# Patient Record
Sex: Male | Born: 1950
Health system: Southern US, Community
[De-identification: ages and names within clinical notes are randomized; demographics above are authoritative.]

## PROBLEM LIST (undated history)

## (undated) DIAGNOSIS — E785 Hyperlipidemia, unspecified: Secondary | ICD-10-CM

## (undated) DIAGNOSIS — N411 Chronic prostatitis: Secondary | ICD-10-CM

## (undated) DIAGNOSIS — I10 Essential (primary) hypertension: Secondary | ICD-10-CM

## (undated) DIAGNOSIS — L409 Psoriasis, unspecified: Secondary | ICD-10-CM

## (undated) DIAGNOSIS — K429 Umbilical hernia without obstruction or gangrene: Secondary | ICD-10-CM

## (undated) HISTORY — PX: TESTICLE SURGERY: SHX794

## (undated) HISTORY — PX: VASECTOMY: SHX75

## (undated) HISTORY — PX: HERNIA REPAIR: SHX51

## (undated) HISTORY — DX: Psoriasis, unspecified: L40.9

## (undated) HISTORY — PX: APPENDECTOMY: SHX54

## (undated) HISTORY — DX: Hyperlipidemia, unspecified: E78.5

## (undated) HISTORY — DX: Umbilical hernia without obstruction or gangrene: K42.9

## (undated) HISTORY — DX: Essential (primary) hypertension: I10

## (undated) HISTORY — DX: Chronic prostatitis: N41.1

---

## 1977-03-19 HISTORY — PX: FERTILITY SURGERY: SHX945

## 2003-03-20 LAB — HM COLONOSCOPY: HM Colonoscopy: NORMAL

## 2003-07-12 ENCOUNTER — Ambulatory Visit (HOSPITAL_COMMUNITY): Admission: RE | Admit: 2003-07-12 | Discharge: 2003-07-12 | Payer: Self-pay | Admitting: *Deleted

## 2005-11-22 ENCOUNTER — Ambulatory Visit: Payer: Self-pay | Admitting: Family Medicine

## 2005-12-15 ENCOUNTER — Emergency Department (HOSPITAL_COMMUNITY): Admission: EM | Admit: 2005-12-15 | Discharge: 2005-12-15 | Payer: Self-pay | Admitting: Emergency Medicine

## 2006-01-09 ENCOUNTER — Ambulatory Visit: Payer: Self-pay | Admitting: Family Medicine

## 2006-02-20 ENCOUNTER — Ambulatory Visit: Payer: Self-pay | Admitting: Family Medicine

## 2006-02-20 LAB — CONVERTED CEMR LAB
Chol/HDL Ratio, serum: 3.2
Cholesterol: 134 mg/dL (ref 0–200)
GFR calc non Af Amer: 66 mL/min
Glomerular Filtration Rate, Af Am: 80 mL/min/{1.73_m2}
Glucose, Bld: 104 mg/dL — ABNORMAL HIGH (ref 70–99)
HDL: 42.2 mg/dL (ref >39.0)
LDL Cholesterol: 81 mg/dL (ref 0–99)
Sodium: 141 meq/L (ref 135–145)

## 2006-03-19 HISTORY — PX: SHOULDER SURGERY: SHX246

## 2006-04-09 DIAGNOSIS — I10 Essential (primary) hypertension: Secondary | ICD-10-CM | POA: Insufficient documentation

## 2006-04-09 DIAGNOSIS — J45909 Unspecified asthma, uncomplicated: Secondary | ICD-10-CM

## 2006-04-17 ENCOUNTER — Ambulatory Visit: Payer: Self-pay | Admitting: Family Medicine

## 2006-07-17 ENCOUNTER — Ambulatory Visit: Payer: Self-pay | Admitting: Family Medicine

## 2006-10-17 ENCOUNTER — Ambulatory Visit: Payer: Self-pay | Admitting: Family Medicine

## 2006-10-17 DIAGNOSIS — J309 Allergic rhinitis, unspecified: Secondary | ICD-10-CM | POA: Insufficient documentation

## 2006-10-18 ENCOUNTER — Telehealth (INDEPENDENT_AMBULATORY_CARE_PROVIDER_SITE_OTHER): Payer: Self-pay | Admitting: *Deleted

## 2006-10-18 LAB — CONVERTED CEMR LAB
BUN: 19 mg/dL (ref 6–23)
CO2: 30 meq/L (ref 19–32)
Calcium: 9.4 mg/dL (ref 8.4–10.5)
Creatinine, Ser: 1.2 mg/dL (ref 0.4–1.5)
GFR calc Af Amer: 80 mL/min
Total CHOL/HDL Ratio: 3.9
VLDL: 13 mg/dL (ref 0–40)

## 2006-12-18 ENCOUNTER — Encounter (INDEPENDENT_AMBULATORY_CARE_PROVIDER_SITE_OTHER): Payer: Self-pay | Admitting: Family Medicine

## 2006-12-18 ENCOUNTER — Telehealth (INDEPENDENT_AMBULATORY_CARE_PROVIDER_SITE_OTHER): Payer: Self-pay | Admitting: *Deleted

## 2007-01-17 ENCOUNTER — Ambulatory Visit: Payer: Self-pay | Admitting: Family Medicine

## 2007-03-10 ENCOUNTER — Telehealth (INDEPENDENT_AMBULATORY_CARE_PROVIDER_SITE_OTHER): Payer: Self-pay | Admitting: *Deleted

## 2007-03-12 ENCOUNTER — Ambulatory Visit: Payer: Self-pay | Admitting: Family Medicine

## 2007-03-20 HISTORY — PX: NASAL SEPTUM SURGERY: SHX37

## 2007-03-20 LAB — HM COLONOSCOPY: HM Colonoscopy: NEGATIVE

## 2007-04-03 ENCOUNTER — Telehealth (INDEPENDENT_AMBULATORY_CARE_PROVIDER_SITE_OTHER): Payer: Self-pay | Admitting: *Deleted

## 2007-04-14 ENCOUNTER — Telehealth (INDEPENDENT_AMBULATORY_CARE_PROVIDER_SITE_OTHER): Payer: Self-pay | Admitting: *Deleted

## 2007-04-14 ENCOUNTER — Ambulatory Visit: Payer: Self-pay | Admitting: Family Medicine

## 2007-04-15 ENCOUNTER — Encounter (INDEPENDENT_AMBULATORY_CARE_PROVIDER_SITE_OTHER): Payer: Self-pay | Admitting: Family Medicine

## 2007-04-15 ENCOUNTER — Encounter (INDEPENDENT_AMBULATORY_CARE_PROVIDER_SITE_OTHER): Payer: Self-pay | Admitting: *Deleted

## 2007-04-15 LAB — CONVERTED CEMR LAB
CO2: 30 meq/L (ref 19–32)
Cholesterol: 144 mg/dL (ref 0–200)
Creatinine, Ser: 1.2 mg/dL (ref 0.4–1.5)
Glucose, Bld: 109 mg/dL — ABNORMAL HIGH (ref 70–99)
HDL: 40.7 mg/dL (ref >39.0)
Potassium: 3.9 meq/L (ref 3.5–5.1)
Sodium: 139 meq/L (ref 135–145)
Triglycerides: 69 mg/dL (ref 0–149)
VLDL: 14 mg/dL (ref 0–40)

## 2007-08-14 ENCOUNTER — Ambulatory Visit: Payer: Self-pay | Admitting: Internal Medicine

## 2007-10-13 ENCOUNTER — Ambulatory Visit: Payer: Self-pay | Admitting: *Deleted

## 2007-10-13 DIAGNOSIS — E785 Hyperlipidemia, unspecified: Secondary | ICD-10-CM

## 2007-11-07 ENCOUNTER — Telehealth (INDEPENDENT_AMBULATORY_CARE_PROVIDER_SITE_OTHER): Payer: Self-pay | Admitting: *Deleted

## 2007-11-07 ENCOUNTER — Ambulatory Visit: Payer: Self-pay | Admitting: Family Medicine

## 2007-11-07 DIAGNOSIS — L259 Unspecified contact dermatitis, unspecified cause: Secondary | ICD-10-CM

## 2007-12-03 ENCOUNTER — Telehealth: Payer: Self-pay | Admitting: Family Medicine

## 2007-12-24 ENCOUNTER — Ambulatory Visit: Payer: Self-pay | Admitting: Internal Medicine

## 2008-03-31 ENCOUNTER — Ambulatory Visit: Payer: Self-pay | Admitting: Internal Medicine

## 2008-03-31 DIAGNOSIS — N411 Chronic prostatitis: Secondary | ICD-10-CM | POA: Insufficient documentation

## 2008-04-02 ENCOUNTER — Telehealth (INDEPENDENT_AMBULATORY_CARE_PROVIDER_SITE_OTHER): Payer: Self-pay | Admitting: *Deleted

## 2008-04-02 LAB — CONVERTED CEMR LAB
Basophils Relative: 0.5 % (ref 0.0–3.0)
CO2: 31 meq/L (ref 19–32)
Calcium: 9.9 mg/dL (ref 8.4–10.5)
Chloride: 101 meq/L (ref 96–112)
Cholesterol: 147 mg/dL (ref 0–200)
Creatinine, Ser: 1.1 mg/dL (ref 0.4–1.5)
Eosinophils Relative: 2.2 % (ref 0.0–5.0)
Glucose, Bld: 109 mg/dL — ABNORMAL HIGH (ref 70–99)
Hemoglobin: 14.4 g/dL (ref 13.0–17.0)
Hgb A1c MFr Bld: 6 % (ref 4.6–6.0)
LDL Cholesterol: 87 mg/dL (ref 0–99)
Lymphocytes Relative: 21.3 % (ref 12.0–46.0)
Monocytes Relative: 6.9 % (ref 3.0–12.0)
Neutro Abs: 4.7 10*3/uL (ref 1.4–7.7)
Neutrophils Relative %: 69.1 % (ref 43.0–77.0)
RBC: 4.33 M/uL (ref 4.22–5.81)
TSH: 1.27 microintl units/mL (ref 0.35–5.50)
Total CHOL/HDL Ratio: 3.4
VLDL: 17 mg/dL (ref 0–40)
WBC: 6.7 10*3/uL (ref 4.5–10.5)

## 2008-05-06 ENCOUNTER — Encounter: Payer: Self-pay | Admitting: Internal Medicine

## 2008-07-12 ENCOUNTER — Encounter (INDEPENDENT_AMBULATORY_CARE_PROVIDER_SITE_OTHER): Payer: Self-pay | Admitting: *Deleted

## 2008-08-11 ENCOUNTER — Ambulatory Visit: Payer: Self-pay | Admitting: Internal Medicine

## 2008-08-11 DIAGNOSIS — R739 Hyperglycemia, unspecified: Secondary | ICD-10-CM | POA: Insufficient documentation

## 2008-08-19 LAB — CONVERTED CEMR LAB: Hgb A1c MFr Bld: 5.9 % (ref 4.6–6.5)

## 2009-01-24 LAB — CONVERTED CEMR LAB: PSA: 0.01 ng/mL

## 2009-02-16 ENCOUNTER — Ambulatory Visit: Payer: Self-pay | Admitting: Internal Medicine

## 2009-02-21 LAB — CONVERTED CEMR LAB
Creatinine,U: 146.1 mg/dL
Microalb, Ur: 0.2 mg/dL (ref 0.0–1.9)

## 2009-03-30 ENCOUNTER — Telehealth (INDEPENDENT_AMBULATORY_CARE_PROVIDER_SITE_OTHER): Payer: Self-pay | Admitting: *Deleted

## 2009-05-02 ENCOUNTER — Telehealth (INDEPENDENT_AMBULATORY_CARE_PROVIDER_SITE_OTHER): Payer: Self-pay | Admitting: *Deleted

## 2009-06-17 ENCOUNTER — Ambulatory Visit: Payer: Self-pay | Admitting: Internal Medicine

## 2009-06-20 LAB — CONVERTED CEMR LAB
ALT: 20 units/L (ref 0–53)
AST: 22 units/L (ref 0–37)
Albumin: 4 g/dL (ref 3.5–5.2)
Alkaline Phosphatase: 57 units/L (ref 39–117)
BUN: 18 mg/dL (ref 6–23)
Basophils Absolute: 0 10*3/uL (ref 0.0–0.1)
Bilirubin, Direct: 0.2 mg/dL (ref 0.0–0.3)
Chloride: 104 meq/L (ref 96–112)
Cholesterol: 131 mg/dL (ref 0–200)
Creatinine, Ser: 1.1 mg/dL (ref 0.4–1.5)
Eosinophils Absolute: 0.2 10*3/uL (ref 0.0–0.7)
GFR calc non Af Amer: 72.28 mL/min (ref >60)
Glucose, Bld: 102 mg/dL — ABNORMAL HIGH (ref 70–99)
HCT: 40 % (ref 39.0–52.0)
Lymphs Abs: 1.7 10*3/uL (ref 0.7–4.0)
MCV: 95.4 fL (ref 78.0–100.0)
Monocytes Absolute: 0.4 10*3/uL (ref 0.1–1.0)
Neutrophils Relative %: 64 % (ref 43.0–77.0)
Platelets: 127 10*3/uL — ABNORMAL LOW (ref 150.0–400.0)
Potassium: 3.8 meq/L (ref 3.5–5.1)
RDW: 13.7 % (ref 11.5–14.6)
Total Protein: 6.5 g/dL (ref 6.0–8.3)
Triglycerides: 53 mg/dL (ref 0.0–149.0)
WBC: 6.5 10*3/uL (ref 4.5–10.5)

## 2009-08-22 ENCOUNTER — Telehealth (INDEPENDENT_AMBULATORY_CARE_PROVIDER_SITE_OTHER): Payer: Self-pay | Admitting: *Deleted

## 2009-09-02 ENCOUNTER — Ambulatory Visit: Payer: Self-pay | Admitting: Internal Medicine

## 2009-10-18 ENCOUNTER — Ambulatory Visit: Payer: Self-pay | Admitting: Internal Medicine

## 2009-11-17 ENCOUNTER — Ambulatory Visit: Payer: Self-pay | Admitting: Family Medicine

## 2009-11-17 DIAGNOSIS — R21 Rash and other nonspecific skin eruption: Secondary | ICD-10-CM

## 2010-04-18 NOTE — Progress Notes (Signed)
Summary: toprol refill   Phone Note Refill Request Message from:  Fax from Pharmacy on March 30, 2009 11:05 AM  Refills Requested: Medication #1:  TOPROL XL 50 MG TB24 Take one tablet daily Initial call taken by: Doristine Devoid,  March 30, 2009 11:05 AM    Prescriptions: TOPROL XL 50 MG TB24 (METOPROLOL SUCCINATE) Take one tablet daily  #90 x 1   Entered by:   Doristine Devoid   Authorized by:   Nolon Rod. Paz MD   Signed by:   Doristine Devoid on 03/30/2009   Method used:   Electronically to        [facility]* (mail-order)             ,          Ph: [phone]       Fax: [phone]   RxID:   [id number]

## 2010-04-18 NOTE — Assessment & Plan Note (Signed)
Summary: 4 month roa//lch   Vital Signs:  Patient profile:   60 year old male Weight:      225.25 pounds Pulse rate:   57 / minute Pulse rhythm:   regular BP sitting:   128 / 84  (left arm) Cuff size:   large  Vitals Entered By: Army Fossa CMA (October 18, 2009 7:58 AM) CC: Pt here for f/u on BP: fasting   History of Present Illness: ROV doing well  patient is keeping an eye on his diet, exercise Ambulatory BPs in the 130-80 range  Current Medications (verified): 1)  Ascriptin 325 Mg Tabs (Aspirin Buf(Alhyd-Mghyd-Cacar)) .... Every Other Day 2)  Proair Hfa 108 (90 Base) Mcg/act Aers (Albuterol Sulfate) .... Use 2 Puffs Every 4-6 Hrs As Needed 3)  Fluticasone Propionate 50 Mcg/act Susp (Fluticasone Propionate) .... Use 1 To 2 Sprays in Each Nostril Daily 4)  Terazosin Hcl 1 Mg Caps (Terazosin Hcl) .... 3 By Mouth Qpm 5)  Hydrochlorothiazide 25 Mg Tabs (Hydrochlorothiazide) .... Take One Tablet Daily 6)  Albertsons Natural Vitamin E 400 Unit Caps (Vitamin E) .... Take 7)  Toprol Xl 50 Mg Tb24 (Metoprolol Succinate) .... Take One Tablet Daily 8)  Protegra   Caps (Multiple Vitamins-Minerals) 9)  Otc Antioxident 10)  Claritin 10 Mg  Tabs (Loratadine) .[name] Kitchen.. 1 A Day 11)  Natural Vitamin 12)  B1 Complex  Allergies: 1)  ! * Allegra 2)  ! * Zyrtec  Past History:  Past Medical History: Asthma Hypertension Allergic rhinitis Hyperlipidemia chronic prostatitis, sees urology Diabetes mellitus, type II (A1C 6.0 03-2008) hand dermatitis-- sees dermatolgy  Past Surgical History: Reviewed history from 03/31/2008 and no changes required. hernia repair Appendectomy fertility surgery 1979 undescended testicle repair left shoulder 2008 deviated septum 2009  Review of Systems       denies chest pain or shortness of breath asthma symptoms well-controlled, rarely used his inhalers Planning to do a colonoscopy this fall  Physical Exam  General:  alert and well-developed.     Lungs:  normal respiratory effort, no intercostal retractions, no accessory muscle use, and normal breath sounds.    Heart:  normal rate, regular rhythm, and no murmur.    Psych:  Oriented X3, memory intact for recent and remote, normally interactive, good eye contact, not anxious appearing, and not depressed appearing.      Impression & Recommendations:  Problem # 1:  DIABETES MELLITUS, TYPE II (ICD-250.00) continue with healthy lifestyle Labs  His updated medication list for this problem includes:    Ascriptin 325 Mg Tabs (Aspirin buf(alhyd-mghyd-cacar)) ..... Every other day  Labs Reviewed: Creat: 1.1 (06/17/2009)    Last Eye Exam: normal (10/17/2008) Reviewed HgBA1c results: 5.8 (02/16/2009)  5.9 (08/11/2008)     Problem # 2:  HYPERTENSION (ICD-401.9) well controlled His updated medication list for this problem includes:    Terazosin Hcl 1 Mg Caps (Terazosin hcl) .[name] KitchenMarland KitchenMarland KitchenMarland Kitchen 3 by mouth qpm    Hydrochlorothiazide 25 Mg Tabs (Hydrochlorothiazide) .[name] Kitchen... Take one tablet daily    Toprol Xl 50 Mg Tb24 (Metoprolol succinate) .[name] Kitchen... Take one tablet daily  BP today: 128/84 Prior BP: 132/84 (09/02/2009)     Problem # 3:  ASTHMA (ICD-493.90) well-controlled His updated medication list for this problem includes:    Proair Hfa 108 (90 Base) Mcg/act Aers (Albuterol sulfate) ..... Use 2 puffs every 4-6 hrs as needed    Complete Medication List: 1)  Ascriptin 325 Mg Tabs (Aspirin buf(alhyd-mghyd-cacar)) .... Every other day 2)  Proair  Hfa 108 (90 Base) Mcg/act Aers (Albuterol sulfate) .... Use 2 puffs every 4-6 hrs as needed 3)  Fluticasone Propionate 50 Mcg/act Susp (Fluticasone propionate) .... Use 1 to 2 sprays in each nostril daily 4)  Terazosin Hcl 1 Mg Caps (Terazosin hcl) .... 3 by mouth qpm 5)  Hydrochlorothiazide 25 Mg Tabs (Hydrochlorothiazide) .... Take one tablet daily 6)  Albertsons Natural Vitamin E 400 Unit Caps (Vitamin e) .... Take 7)  Toprol Xl 50 Mg Tb24 (Metoprolol  succinate) .... Take one tablet daily 8)  Protegra Caps (Multiple vitamins-minerals) 9)  Otc Antioxident  10)  Claritin 10 Mg Tabs (Loratadine) .[name] Kitchen.. 1 a day 11)  Natural Vitamin  12)  B1 Complex   Other Orders: Venipuncture (52841) TLB-A1C / Hgb A1C (Glycohemoglobin) (83036-A1C) Specimen Handling (32440)  Patient Instructions: 1)  Please schedule a follow-up appointment in 8 months  (April 2012) for your physical 2)  don't forget your flu shot this winter

## 2010-04-18 NOTE — Progress Notes (Signed)
  Phone Note Refill Request Call back at [phone] Message from:  Patient  Refills Requested: Medication #1:  TERAZOSIN HCL 1 MG CAPS 3 by mouth qpm need to send to [facility]  Initial call taken by: Kandice Hams,  May 02, 2009 3:23 PM  Follow-up for Phone Call        PT INFORMED RX SENT TO [facility] ELECTRONICALLY .Kandice Hams  May 02, 2009 3:26 PM   Follow-up by: Kandice Hams,  May 02, 2009 3:26 PM    Prescriptions: TERAZOSIN HCL 1 MG CAPS (TERAZOSIN HCL) 3 by mouth qpm  #270 x 0   Entered by:   Kandice Hams   Authorized by:   Nolon Rod. Paz MD   Signed by:   Kandice Hams on 05/02/2009   Method used:   Faxed to ...       [facility]* (mail-order)             ,          Ph: [phone]       Fax: [phone]   RxID:   [id number]

## 2010-04-18 NOTE — Progress Notes (Signed)
Summary: Refill Request  Phone Note Refill Request Call back at [phone] Message from:  Patient on August 22, 2009 9:00 AM  Refills Requested: Medication #1:  FLUTICASONE PROPIONATE 50 MCG/ACT SUSP use 1 to 2 sprays in each nostril daily   Dosage confirmed as above?Dosage Confirmed   Supply Requested: 3 months [facility]   Method Requested: Fax to [facility] Next Appointment Scheduled: 8.2.11 Initial call taken by: Harold Barban,  August 22, 2009 9:00 AM    Prescriptions: FLUTICASONE PROPIONATE 50 MCG/ACT SUSP (FLUTICASONE PROPIONATE) use 1 to 2 sprays in each nostril daily  #3 x 3   Entered by:   Jeremy Johann CMA   Authorized by:   Nolon Rod. Paz MD   Signed by:   Jeremy Johann CMA on 08/22/2009   Method used:   Faxed to ...       [facility]* (mail-order)             ,          Ph: [phone]       Fax: [phone]   RxID:   [id number]

## 2010-04-18 NOTE — Assessment & Plan Note (Signed)
Summary: sore throat/cbs   Vital Signs:  Patient profile:   60 year old male Weight:      222.8 pounds BMI:     31.19 Temp:     98.9 degrees F oral Pulse rate:   60 / minute Resp:     14 per minute BP sitting:   132 / 84  (left arm) Cuff size:   large  Vitals Entered By: Shonna Chock (September 02, 2009 11:08 AM) CC: Sore throat and cough, URI symptoms Comments REVIEWED MED LIST, PATIENT AGREED DOSE AND INSTRUCTION CORRECT    Primary Care Provider:  Dr. Drue Novel  CC:  Sore throat and cough and URI symptoms.  History of Present Illness: Onset 2 weeks ago as ST followed by head congestion. "Bronchitis" last week which improved with: Mucinex DM. Previously on Flonase for head congestion  with benefit. Severe ST this am prompted him to come in.  The patient reports minor  dry cough and  minor earache, but denies nasal congestion, purulent nasal discharge, and sick contacts.  The patient denies fever, stiff neck, dyspnea, wheezing, rash, vomiting, and diarrhea.  The patient denies itchy watery eyes, sneezing, headache, muscle aches, and severe fatigue.  Risk factors for Strep sinusitis include double sickening and tooth pain.  The patient denies the following risk factors for Strep sinusitis: unilateral facial pain, unilateral nasal discharge, and Strep exposure.  No rise in FBS with present illness  Allergies: 1)  ! * Allegra 2)  ! * Zyrtec  Physical Exam  General:  in no acute distress; alert,appropriate and cooperative throughout examination Ears:  External ear exam shows no significant lesions or deformities.  Otoscopic examination reveals clear canals, tympanic membranes are intact bilaterally without bulging, retraction, inflammation or discharge. Hearing is grossly normal bilaterally. Nose:  External nasal examination shows no deformity or inflammation. Nasal mucosa are  erythematous without lesions or exudates. Mouth:  Oral mucosa and oropharynx without lesions or exudates.  Teeth in good  repair. Mild pharyngeal erythema.   Lungs:  Normal respiratory effort, chest expands symmetrically. Lungs are clear to auscultation, no crackles or wheezes. Heart:  regular rhythm, no murmur, and bradycardia.   Cervical Nodes:  No lymphadenopathy noted Axillary Nodes:  No palpable lymphadenopathy   Impression & Recommendations:  Problem # 1:  PHARYNGITIS-ACUTE (ICD-462)  His updated medication list for this problem includes:    Ascriptin 325 Mg Tabs (Aspirin buf(alhyd-mghyd-cacar)) ..... Every other day    Trimox 500 Mg Caps (Amoxicillin) .[name] Kitchen... 1 three times a day as needed  Problem # 2:  DIABETES MELLITUS, TYPE II (ICD-250.00) Controlled with TLC His updated medication list for this problem includes:    Ascriptin 325 Mg Tabs (Aspirin buf(alhyd-mghyd-cacar)) ..... Every other day  Complete Medication List: 1)  Ascriptin 325 Mg Tabs (Aspirin buf(alhyd-mghyd-cacar)) .... Every other day 2)  Proair Hfa 108 (90 Base) Mcg/act Aers (Albuterol sulfate) .... Use 2 puffs every 4-6 hrs as needed 3)  Fluticasone Propionate 50 Mcg/act Susp (Fluticasone propionate) .... Use 1 to 2 sprays in each nostril daily 4)  Terazosin Hcl 1 Mg Caps (Terazosin hcl) .... 3 by mouth qpm 5)  Hydrochlorothiazide 25 Mg Tabs (Hydrochlorothiazide) .... Take one tablet daily 6)  Albertsons Natural Vitamin E 400 Unit Caps (Vitamin e) .... Take 7)  Toprol Xl 50 Mg Tb24 (Metoprolol succinate) .... Take one tablet daily 8)  Protegra Caps (Multiple vitamins-minerals) 9)  Otc Antioxident  10)  Claritin 10 Mg Tabs (Loratadine) .[name] Kitchen.. 1 a day 11)  Natural Vitamin  12)  B1 Complex  13)  Trimox 500 Mg Caps (Amoxicillin) .[name] Kitchen.. 1 three times a day as needed  Other Orders: Rapid Strep (81191)  Patient Instructions: 1)  Drink as much fluid as you can tolerate for the next few days. Zicam Melts or Sugar Free Hall's lozenges. Prescriptions: TRIMOX 500 MG CAPS (AMOXICILLIN) 1 three times a day as needed  #30 x 0   Entered and  Authorized by:   Marga Melnick MD   Signed by:   Marga Melnick MD on 09/02/2009   Method used:   Faxed to ...       [id number] (retail)       [address]       [place], [place]  [address]       Ph: [phone]       Fax: [phone]   RxID:   [id number]   Laboratory Results    Other Tests  Rapid Strep: negative

## 2010-04-18 NOTE — Assessment & Plan Note (Signed)
Summary: rash on leg/kn  Flu Vaccine Consent Questions     Do you have a history of severe allergic reactions to this vaccine? no    Any prior history of allergic reactions to egg and/or gelatin? no    Do you have a sensitivity to the preservative Thimersol? no    Do you have a past history of Guillan-Barre Syndrome? no    Do you currently have an acute febrile illness? no    Have you ever had a severe reaction to latex? no    Vaccine information given and explained to patient? yes    Are you currently pregnant? no    Lot Number:[id number]   Exp Date:09/16/2010   Site Given  Left Deltoid IM    Vital Signs:  Patient profile:   60 year old male Weight:      229 pounds Temp:     98.3 degrees F oral BP sitting:   120 / 86  (left arm)  Vitals Entered By: Doristine Devoid CMA (November 17, 2009 1:21 PM) CC: Rash on back of L leg and on stomach doing lots of itching   History of Present Illness: 60 yo man here today for rash on L leg and stomach.  initially on L leg and then R lower abd.  works in [employer], has had chemical rxns in the past.  sxs first appeared Tues evening.  has used hydrocortisone w/out relief.  played 18 holes of golf yesterday w/out difficulty.  no fevers, chills, pain.  Problems Prior to Update: 1)  Diabetes Mellitus, Type II  (ICD-250.00) 2)  Need Prophylactic Vaccination&inoculation Flu  (ICD-V04.81) 3)  ? of Hyperglycemia, Borderline  (ICD-790.29) 4)  Prostatitis, Chronic  (ICD-601.1) 5)  Contact Dermatitis  (ICD-692.9) 6)  Hyperlipidemia  (ICD-272.4) 7)  Preventive Health Care  (ICD-V70.0) 8)  Allergic Rhinitis  (ICD-477.9) 9)  Hypertension  (ICD-401.9) 10)  Asthma  (ICD-493.90)  Allergies (verified): 1)  ! * Allegra 2)  ! * Zyrtec  Review of Systems      See HPI  Physical Exam  General:  alert and well-developed.   Skin:  5 papular areas on L posterior/medial knee w/ surrounding erythema.  no evidence of fluctuance or streaking  redness  4 papular areas on R lower abd into groin w/ minimal surrounding redness   Impression & Recommendations:  Problem # 1:  RASH-NONVESICULAR (ICD-782.1) Assessment New unclear etiology.  no evidence of infxn.  pt reports itching 'isn't that bad'.   offered oral steroids vs topical cream.  after discussion of possible side effects pt elected to start topical cream.  reviewed supportive care and red flags that should prompt return.  Pt expresses understanding and is in agreement w/ this plan. His updated medication list for this problem includes:    Hydrocortisone 2.5 % Oint (Hydrocortisone) .[name] Kitchen... Apply to affected areas two times a day.  disp 1 large tube  Complete Medication List: 1)  Ascriptin 325 Mg Tabs (Aspirin buf(alhyd-mghyd-cacar)) .... Every other day 2)  Proair Hfa 108 (90 Base) Mcg/act Aers (Albuterol sulfate) .... Use 2 puffs every 4-6 hrs as needed 3)  Fluticasone Propionate 50 Mcg/act Susp (Fluticasone propionate) .... Use 1 to 2 sprays in each nostril daily 4)  Terazosin Hcl 1 Mg Caps (Terazosin hcl) .... 3 by mouth qpm 5)  Hydrochlorothiazide 25 Mg Tabs (Hydrochlorothiazide) .... Take one tablet daily 6)  Albertsons Natural Vitamin E 400 Unit Caps (Vitamin e) .... Take 7)  Toprol Xl  50 Mg Tb24 (Metoprolol succinate) .... Take one tablet daily 8)  Protegra Caps (Multiple vitamins-minerals) 9)  Otc Antioxident  10)  Claritin 10 Mg Tabs (Loratadine) .[name] Kitchen.. 1 a day 11)  Natural Vitamin  12)  B1 Complex  13)  Hydrocortisone 2.5 % Oint (Hydrocortisone) .... Apply to affected areas two times a day.  disp 1 large tube  Other Orders: Admin 1st Vaccine (16109) Flu Vaccine 70yrs + (60454)  Patient Instructions: 1)  Use the Hydrocortisone cream two times a day for the itching 2)  Add OTC Benadryl as needed for night time itching 3)  Make sure you wash all the clothing you were wearing while doing yard work over the weekend or while at work to avoid it from spreading 4)  If  it is worsening, spreading, has additional redness or pus- please call 5)  Hang in there and have a great holiday! Prescriptions: HYDROCORTISONE 2.5 % OINT (HYDROCORTISONE) apply to affected areas two times a day.  disp 1 large tube  #1 x 1   Entered and Authorized by:   Neena Rhymes MD   Signed by:   Neena Rhymes MD on 11/17/2009   Method used:   Electronically to        [id number] (retail)       [address]       [place], [place]  [address]       Ph: [phone]       Fax: [phone]   RxID:   [id number]

## 2010-04-18 NOTE — Assessment & Plan Note (Signed)
Summary: CPX//PH   Vital Signs:  Patient profile:   60 year old male Height:      71 inches Weight:      223.2 pounds BMI:     31.24 Pulse rate:   64 / minute BP sitting:   155 / 80  Vitals Entered By: Shary Decamp (June 17, 2009 8:16 AM) CC: cpx - fasting Comments  - pt fell 22 feet (ladder) 03/02/09 -- only injury was a sprained wrist Shary Decamp  June 17, 2009 8:25 AM    History of Present Illness: cpx - fasting doing well some stress lately  pt fell 22 feet (ladder) 03/02/09 -- only injury was a sprained wrist (better now)     Current Medications (verified): 1)  Ascriptin 325 Mg Tabs (Aspirin Buf(Alhyd-Mghyd-Cacar)) .... Every Other Day 2)  Proair Hfa 108 (90 Base) Mcg/act Aers (Albuterol Sulfate) .... Use 2 Puffs Every 4-6 Hrs As Needed 3)  Fluticasone Propionate 50 Mcg/act Susp (Fluticasone Propionate) .... Use 1 To 2 Sprays in Each Nostril Daily 4)  Terazosin Hcl 1 Mg Caps (Terazosin Hcl) .... 3 By Mouth Qpm 5)  Hydrochlorothiazide 25 Mg Tabs (Hydrochlorothiazide) .... Take One Tablet Daily 6)  Albertsons Natural Vitamin E 400 Unit Caps (Vitamin E) .... Take 7)  Toprol Xl 50 Mg Tb24 (Metoprolol Succinate) .... Take One Tablet Daily 8)  Protegra   Caps (Multiple Vitamins-Minerals) 9)  Otc Antioxident 10)  Claritin 10 Mg  Tabs (Loratadine) .[name] Kitchen.. 1 A Day 11)  Natural Vitamin 12)  B1 Complex  Allergies (verified): 1)  ! * Allegra 2)  ! * Zyrtec  Past History:  Past Medical History: Asthma Hypertension Allergic rhinitis Hyperlipidemia chronic prostatitis, sees urology Diabetes mellitus, type II (A1C 6.0 03-2008) hand dermatitis-- sees dermatolgy  Past Surgical History: Reviewed history from 03/31/2008 and no changes required. hernia repair Appendectomy fertility surgery 1979 undescended testicle repair left shoulder 2008 deviated septum 2009  Family History: Reviewed history from 03/31/2008 and no changes required. Diabetes-- sister  High  cholesterol-- (+)? Hypertension-- M, sister  MI: father, x 3 , onset age 8 , died at 77  polyps colon--F and M colon ca--no prostate ca--no bladder ca--F   Social History: Occupation: Diplomatic Services operational officer Married 2 children Never Smoked Alcohol use-yes:socially Drug use-no exercises routinely @ the [employer]-- watching   Review of Systems       Hypertension-- good ambulatory BPs , pulse 55x' chronic prostatitis-- sees urology routinely  Diabetes -- watching his diet , has lost wt  ENT:  good medication compliance w/  flonase . CV:  Denies chest pain or discomfort and swelling of feet. Resp:  uses albuterol rarely (q 3 months). GI:  Denies bloody stools, diarrhea, nausea, and vomiting.  Physical Exam  General:  alert and well-developed.  has lost a few pounds Neck:  no masses, no thyromegaly, and normal carotid upstroke.   Lungs:  normal respiratory effort, no intercostal retractions, no accessory muscle use, and normal breath sounds.   Heart:  normal rate, regular rhythm, no murmur, and no gallop.   Abdomen:  soft, non-tender, no distention, no masses, no guarding, and no rigidity.   Extremities:  no lower extremity edema Psych:  Oriented X3, memory intact for recent and remote, normally interactive, good eye contact, not anxious appearing, and not depressed appearing.     Impression & Recommendations:  Problem # 1:  PREVENTIVE HEALTH CARE (ICD-V70.0)  continue with his healthier  lifestyle  Td 2003 had PSAs checked  @ [facility]   Cscope [phone], repeat in 5 to 10 years  (Dr Luther Parody), FH of colon polyps---------> rec Cscope     Orders: Venipuncture (60454) TLB-BMP (Basic Metabolic Panel-BMET) (80048-METABOL) TLB-CBC Platelet - w/Differential (85025-CBCD) TLB-Lipid Panel (80061-LIPID) TLB-Hepatic/Liver Function Pnl (80076-HEPATIC) Gastroenterology Referral (GI)  Problem # 2:  DIABETES MELLITUS, TYPE II (ICD-250.00) has improved his  lifestyle,  doing well His updated medication list for this problem includes:    Ascriptin 325 Mg Tabs (Aspirin buf(alhyd-mghyd-cacar)) ..... Every other day  Problem # 3:  HYPERTENSION (ICD-401.9) reports good amb.  BPs see  instructions His updated medication list for this problem includes:    Terazosin Hcl 1 Mg Caps (Terazosin hcl) .[name] KitchenMarland KitchenMarland KitchenMarland Kitchen 3 by mouth qpm    Hydrochlorothiazide 25 Mg Tabs (Hydrochlorothiazide) .[name] Kitchen... Take one tablet daily    Toprol Xl 50 Mg Tb24 (Metoprolol succinate) .[name] Kitchen... Take one tablet daily  BP today: 155/80 Prior BP: 152/94 (02/16/2009)  Labs Reviewed: K+: 3.9 (03/31/2008) Creat: : 1.1 (03/31/2008)   Chol: 147 (03/31/2008)   HDL: 43.5 (03/31/2008)   LDL: 87 (03/31/2008)   TG: 83 (03/31/2008)  Complete Medication List: 1)  Ascriptin 325 Mg Tabs (Aspirin buf(alhyd-mghyd-cacar)) .... Every other day 2)  Proair Hfa 108 (90 Base) Mcg/act Aers (Albuterol sulfate) .... Use 2 puffs every 4-6 hrs as needed 3)  Fluticasone Propionate 50 Mcg/act Susp (Fluticasone propionate) .... Use 1 to 2 sprays in each nostril daily 4)  Terazosin Hcl 1 Mg Caps (Terazosin hcl) .... 3 by mouth qpm 5)  Hydrochlorothiazide 25 Mg Tabs (Hydrochlorothiazide) .... Take one tablet daily 6)  Albertsons Natural Vitamin E 400 Unit Caps (Vitamin e) .... Take 7)  Toprol Xl 50 Mg Tb24 (Metoprolol succinate) .... Take one tablet daily 8)  Protegra Caps (Multiple vitamins-minerals) 9)  Otc Antioxident  10)  Claritin 10 Mg Tabs (Loratadine) .[name] Kitchen.. 1 a day 11)  Natural Vitamin  12)  B1 Complex   Patient Instructions: 1)  Please schedule a follow-up appointment in 4 months . 2)  Check your blood pressure 2 or 3 times a week. If it is more than 140/85 consistently,please let us know   Prescriptions: HYDROCHLOROTHIAZIDE 25 MG TABS (HYDROCHLOROTHIAZIDE) Take one tablet daily  #90 x 3   Entered and Authorized by:   Nolon Rod. Toneisha Savary MD   Signed by:   Nolon Rod. Mariaclara Spear MD on 06/17/2009   Method used:   Electronically  to        [facility]* (mail-order)             ,          Ph: [phone]       Fax: [phone]   RxID:   [id number] TERAZOSIN HCL 1 MG CAPS (TERAZOSIN HCL) 3 by mouth qpm  #270 x 3   Entered and Authorized by:   Nolon Rod. Shandrika Ambers MD   Signed by:   Nolon Rod. Colbe Viviano MD on 06/17/2009   Method used:   Electronically to        [facility] (mail-order)             ,          Ph: [phone]       Fax: [phone]   RxID:   [id number] TOPROL XL 50 MG TB24 (METOPROLOL SUCCINATE) Take one tablet daily  #90 x 4   Entered and Authorized by:   Nolon Rod. Gracyn Allor MD   Signed by:  Bellamie Turney E. Anthonymichael Munday MD on 06/17/2009   Method used:   Electronically to        [facility]* (mail-order)             ,          Ph: [phone]       Fax: [phone]   RxID:   [id number]    Preventive Care Screening  Prior Values:    PSA:  0.01 (01/24/2009)    Colonoscopy:  normal (03/20/2003)    Last Tetanus Booster:  given (03/19/2001)    Last Flu Shot:  Fluvax 3+ (12/24/2007)    Risk Factors: Tobacco use:  never Passive smoke exposure:  yes Drug use:  no HIV high-risk behavior:  no Caffeine use:  2 drinks per day Alcohol use:  yes    Type:  beer    Drinks per day:  <1 Exercise:  yes    Times per week:  4    Type:  walk Seatbelt use:  100 % Sun Exposure:  occasionally  Family History Risk Factors:    Family History of MI in females < 64 years old:  no    Family History of MI in males < 33 years old:  no  Colonoscopy History:    Date of Last Colonoscopy:  03/20/2003

## 2010-07-19 ENCOUNTER — Encounter: Payer: Self-pay | Admitting: Internal Medicine

## 2010-07-20 ENCOUNTER — Encounter: Payer: Self-pay | Admitting: Internal Medicine

## 2010-07-20 ENCOUNTER — Ambulatory Visit (INDEPENDENT_AMBULATORY_CARE_PROVIDER_SITE_OTHER): Payer: BC Managed Care – PPO | Admitting: Internal Medicine

## 2010-07-20 DIAGNOSIS — Z0000 Encounter for general adult medical examination without abnormal findings: Secondary | ICD-10-CM | POA: Insufficient documentation

## 2010-07-20 DIAGNOSIS — E785 Hyperlipidemia, unspecified: Secondary | ICD-10-CM

## 2010-07-20 DIAGNOSIS — I10 Essential (primary) hypertension: Secondary | ICD-10-CM

## 2010-07-20 DIAGNOSIS — J45909 Unspecified asthma, uncomplicated: Secondary | ICD-10-CM

## 2010-07-20 DIAGNOSIS — E119 Type 2 diabetes mellitus without complications: Secondary | ICD-10-CM

## 2010-07-20 LAB — CBC WITH DIFFERENTIAL/PLATELET
Basophils Relative: 0.5 % (ref 0.0–3.0)
Eosinophils Absolute: 0.2 10*3/uL (ref 0.0–0.7)
HCT: 38.8 % — ABNORMAL LOW (ref 39.0–52.0)
Hemoglobin: 13.3 g/dL (ref 13.0–17.0)
Lymphocytes Relative: 30.1 % (ref 12.0–46.0)
Lymphs Abs: 1.6 10*3/uL (ref 0.7–4.0)
MCHC: 34.2 g/dL (ref 30.0–36.0)
Neutro Abs: 3.3 10*3/uL (ref 1.4–7.7)
RBC: 4.05 Mil/uL — ABNORMAL LOW (ref 4.22–5.81)

## 2010-07-20 LAB — BASIC METABOLIC PANEL
BUN: 15 mg/dL (ref 6–23)
CO2: 29 mEq/L (ref 19–32)
Chloride: 99 mEq/L (ref 96–112)
Creatinine, Ser: 1.1 mg/dL (ref 0.4–1.5)

## 2010-07-20 LAB — LIPID PANEL
Cholesterol: 152 mg/dL (ref 0–200)
Total CHOL/HDL Ratio: 4
Triglycerides: 85 mg/dL (ref 0.0–149.0)

## 2010-07-20 LAB — ALT: ALT: 18 U/L (ref 0–53)

## 2010-07-20 LAB — MICROALBUMIN / CREATININE URINE RATIO: Microalb, Ur: 0.1 mg/dL (ref 0.0–1.9)

## 2010-07-20 LAB — AST: AST: 24 U/L (ref 0–37)

## 2010-07-20 LAB — TSH: TSH: 1.13 u[IU]/mL (ref 0.35–5.50)

## 2010-07-20 NOTE — Assessment & Plan Note (Signed)
Well controlled w/ albuterol PRN

## 2010-07-20 NOTE — Progress Notes (Signed)
  Subjective:    Patient ID: Kirk Sutton, male    DOB: 1950/10/27, 60 y.o.   MRN: [id number]  HPI  CPX  Past Medical History  Diagnosis Date  . Asthma   . Hypertension   . Allergic rhinitis   . Hyperlipidemia   . Chronic prostatitis     sees urology  . Diabetes mellitus     type 11 (a1c 6.0 03/2008)  . Psoriasis     @ hands, sees derm   Past Surgical History  Procedure Date  . Hernia repair   . Appendectomy   . Fertility surgery 1979  . Testicle surgery     undescended repair   . Shoulder surgery 2008    left  . Nasal septum surgery 2009   Family History  Problem Relation Age of Onset  . Diabetes Sister   . Hyperlipidemia    . Hypertension Mother   . Hypertension Sister   . Heart attack Father     x 3 onset age 25, died at 27  . Colon polyps Mother   . Colon polyps Father   . Colon cancer Neg Hx   . Prostate cancer Neg Hx    History   Social History  . Marital Status: Married    Spouse Name: N/A    Number of Children: 2  . Years of Education: N/A   Occupational History  . Water treatment Dispensing optician    Social History Main Topics  . Smoking status: Never Smoker   . Smokeless tobacco: Not on file  . Alcohol Use: Yes     socially  . Drug Use: No  . Sexually Active: Not on file   Other Topics Concern  . Not on file   Social History Narrative   Ex[facility]- [facility], walking 2-3 times weekly.[name] KitchenMarland KitchenDiet-- watching       Review of Systems  Constitutional: Negative for fever, fatigue and unexpected weight change.       No amb CBGs, watching diet  Respiratory: Negative for cough and shortness of breath.   Cardiovascular: Negative for chest pain and leg swelling.       Amb BP wnl  Gastrointestinal: Negative for abdominal pain and blood in stool.  Psychiatric/Behavioral:       No anxiety-depression       Objective:   Physical Exam  Constitutional: He is oriented to person, place, and time. He appears well-developed and  well-nourished. No distress.  HENT:  Head: Normocephalic and atraumatic.  Neck: No thyromegaly present.       Normal carotid pulse   Cardiovascular: Normal rate, regular rhythm and normal heart sounds.   No murmur heard. Pulmonary/Chest: Effort normal and breath sounds normal. No respiratory distress. He has no wheezes. He has no rales.  Abdominal: Soft. Bowel sounds are normal. He exhibits no distension. There is no tenderness. There is no rebound and no guarding.  Musculoskeletal: He exhibits no edema.       NORMAL DIABETIC FOOT EXAM, NORMAL PEDAL PULSES   Neurological: He is alert and oriented to person, place, and time.  Skin: He is not diaphoretic.       Anterior neck: 4mm, non fluctuant inflamation (folliculitis)  Psychiatric: He has a normal mood and affect. His behavior is normal. Judgment and thought content normal.          Assessment & Plan:

## 2010-07-20 NOTE — Assessment & Plan Note (Signed)
Diet controlled, labs 

## 2010-07-20 NOTE — Assessment & Plan Note (Signed)
Due for labs

## 2010-07-20 NOTE — Assessment & Plan Note (Addendum)
Td 2003 had PSAs checked  @ [facility]   Cscope [phone], repeat in 5 to 10 years  (Dr Luther Parody), FH of colon polyps, pt knows is due for a Cscope and plans to do that soon Diet-exercise discussed  Also has folliculitis in the neck, getting better, call if not well in few days or if worse

## 2010-07-24 ENCOUNTER — Telehealth: Payer: Self-pay | Admitting: *Deleted

## 2010-07-24 NOTE — Telephone Encounter (Signed)
Message copied by Army Fossa on Mon Jul 24, 2010 10:25 AM ------      Message from: Willow Ora      Created: Sat Jul 22, 2010  1:40 PM       Advise patient:      diabetes and cholesterol well controlled       Hemoglobin is normal but is slightly lower than before, and that needs to be rechecked from time to time.      Very good results, continue with same medicines

## 2010-07-24 NOTE — Telephone Encounter (Signed)
Message left for patient to return my call.  

## 2010-08-14 ENCOUNTER — Other Ambulatory Visit: Payer: Self-pay | Admitting: Internal Medicine

## 2010-08-16 ENCOUNTER — Other Ambulatory Visit: Payer: Self-pay | Admitting: Internal Medicine

## 2010-08-16 MED ORDER — METOPROLOL SUCCINATE ER 50 MG PO TB24: 50.0000 mg | ORAL_TABLET | Freq: Every day | ORAL | Status: DC

## 2010-08-16 MED ORDER — TERAZOSIN HCL 1 MG PO CAPS: ORAL_CAPSULE | ORAL | Status: DC

## 2010-08-16 MED ORDER — ALBUTEROL SULFATE HFA 108 (90 BASE) MCG/ACT IN AERS: 2.0000 | INHALATION_SPRAY | Freq: Four times a day (QID) | RESPIRATORY_TRACT | Status: DC | PRN

## 2010-08-16 MED ORDER — HYDROCHLOROTHIAZIDE 25 MG PO TABS: 25.0000 mg | ORAL_TABLET | Freq: Every day | ORAL | Status: DC

## 2010-08-16 NOTE — Telephone Encounter (Signed)
meds refilled 

## 2010-10-24 ENCOUNTER — Other Ambulatory Visit: Payer: Self-pay | Admitting: Internal Medicine

## 2010-11-16 ENCOUNTER — Other Ambulatory Visit: Payer: Self-pay | Admitting: Internal Medicine

## 2010-11-16 MED ORDER — ALBUTEROL SULFATE HFA 108 (90 BASE) MCG/ACT IN AERS: 2.0000 | INHALATION_SPRAY | Freq: Four times a day (QID) | RESPIRATORY_TRACT | Status: DC | PRN

## 2010-11-16 NOTE — Telephone Encounter (Signed)
patient wants rx called in to new pharm - [facility] [place] for  pro air inhaler

## 2010-11-16 NOTE — Telephone Encounter (Signed)
Rx Done . 

## 2011-01-22 ENCOUNTER — Encounter: Payer: Self-pay | Admitting: Internal Medicine

## 2011-01-22 ENCOUNTER — Ambulatory Visit (INDEPENDENT_AMBULATORY_CARE_PROVIDER_SITE_OTHER): Payer: BC Managed Care – PPO | Admitting: Internal Medicine

## 2011-01-22 DIAGNOSIS — I10 Essential (primary) hypertension: Secondary | ICD-10-CM

## 2011-01-22 DIAGNOSIS — J45909 Unspecified asthma, uncomplicated: Secondary | ICD-10-CM

## 2011-01-22 DIAGNOSIS — E119 Type 2 diabetes mellitus without complications: Secondary | ICD-10-CM

## 2011-01-22 DIAGNOSIS — Z23 Encounter for immunization: Secondary | ICD-10-CM

## 2011-01-22 LAB — BASIC METABOLIC PANEL
CO2: 30 mEq/L (ref 19–32)
Glucose, Bld: 103 mg/dL — ABNORMAL HIGH (ref 70–99)
Potassium: 3.7 mEq/L (ref 3.5–5.1)
Sodium: 141 mEq/L (ref 135–145)

## 2011-01-22 NOTE — Progress Notes (Signed)
  Subjective:    Patient ID: Kirk Sutton, male    DOB: 11-Sep-1950, 60 y.o.   MRN: [id number]  HPI Routine office visit, feeling well.  Past Medical History  Diagnosis Date  . Asthma   . Hypertension   . Allergic rhinitis   . Hyperlipidemia   . Chronic prostatitis     sees urology  . Diabetes mellitus     type 11 (a1c 6.0 03/2008)  . Psoriasis     @ hands, sees derm   Past Surgical History  Procedure Date  . Hernia repair   . Appendectomy   . Fertility surgery 1979  . Testicle surgery     undescended repair   . Shoulder surgery 2008    left  . Nasal septum surgery 2009     Review of Systems Good med compliance w/ all meds (asa 325  qod) Hardly ever uses albuterol He remains active amb BPs in the 120/80 or better  No CP-SOB     Objective:   Physical Exam  Constitutional: He is oriented to person, place, and time. He appears well-developed and well-nourished. No distress.  Cardiovascular: Normal rate, regular rhythm and normal heart sounds.   No murmur heard. Pulmonary/Chest: Effort normal and breath sounds normal. No respiratory distress. He has no wheezes. He has no rales.  Musculoskeletal: He exhibits no edema.  Neurological: He is alert and oriented to person, place, and time.  Skin: He is not diaphoretic.       Assessment & Plan:

## 2011-01-22 NOTE — Assessment & Plan Note (Signed)
To have a cscope soon PSA to be checked soon at urology

## 2011-01-22 NOTE — Assessment & Plan Note (Signed)
Labs

## 2011-01-22 NOTE — Assessment & Plan Note (Signed)
At goal, check a BMP

## 2011-01-22 NOTE — Assessment & Plan Note (Signed)
weel controlled Had a flu shot already

## 2011-01-23 ENCOUNTER — Encounter: Payer: Self-pay | Admitting: Internal Medicine

## 2011-02-28 ENCOUNTER — Ambulatory Visit (INDEPENDENT_AMBULATORY_CARE_PROVIDER_SITE_OTHER): Payer: BC Managed Care – PPO | Admitting: Internal Medicine

## 2011-02-28 VITALS — BP 138/78 | HR 67 | Temp 98.7°F | Ht 70.5 in | Wt 231.0 lb

## 2011-02-28 DIAGNOSIS — J40 Bronchitis, not specified as acute or chronic: Secondary | ICD-10-CM

## 2011-02-28 MED ORDER — ALBUTEROL SULFATE HFA 108 (90 BASE) MCG/ACT IN AERS: 2.0000 | INHALATION_SPRAY | Freq: Four times a day (QID) | RESPIRATORY_TRACT | Status: DC | PRN

## 2011-02-28 MED ORDER — AZITHROMYCIN 250 MG PO TABS: ORAL_TABLET | ORAL | Status: AC

## 2011-02-28 NOTE — Patient Instructions (Signed)
Rest, fluids , tylenol For cough, take Mucinex DM twice a day as needed   Take the antibiotic as prescribed  (zithromax ) Call if no better in few days Call anytime if the symptoms are severe  

## 2011-02-28 NOTE — Progress Notes (Signed)
  Subjective:    Patient ID: Kirk Sutton, male    DOB: March 18, 1951, 60 y.o.   MRN: [id number]  HPI 2 weeks h/o cough, mostly dry, occ sputum, slt green. Asthma ok, has not increaswed the need of inhalers but needs a RF  Past Medical History: Asthma Hypertension Allergic rhinitis Hyperlipidemia chronic prostatitis, sees urology Diabetes mellitus, type II (A1C 6.0 03-2008) Psoriasis (mostly hands)-- sees dermatolgy  Past Surgical History: hernia repair Appendectomy fertility surgery 1979 undescended testicle repair left shoulder 2008 deviated septum 2009  Family History: Diabetes-- sister  High cholesterol-- (+)? Hypertension-- M, sister  MI: father, x 3 , onset age 53 , died at 29  polyps colon--F and M colon ca--no prostate ca--no bladder ca--F   Social History: Occupation: Diplomatic Services operational officer Married 2 children Never Smoked Alcohol use-yes:socially Drug use-no    Review of Systems No F-C No N-V-D Mild SOB w/cough episodes No CP    Objective:   Physical Exam  Constitutional: He appears well-developed and well-nourished.  HENT:  Head: Normocephalic and atraumatic.  Right Ear: External ear normal.  Left Ear: External ear normal.  Nose: Nose normal.  Mouth/Throat: No oropharyngeal exudate.       Face symmetric, no TTP.  Cardiovascular: Normal rate and regular rhythm.   No murmur heard. Pulmonary/Chest:       Few ronchi w/cough, otherwise neg           Assessment & Plan:  Bronchitis: See instructions

## 2011-05-07 ENCOUNTER — Other Ambulatory Visit: Payer: Self-pay | Admitting: Internal Medicine

## 2011-05-07 NOTE — Telephone Encounter (Signed)
Refill done.  

## 2011-07-23 ENCOUNTER — Encounter: Payer: BC Managed Care – PPO | Admitting: Internal Medicine

## 2011-08-22 ENCOUNTER — Ambulatory Visit (INDEPENDENT_AMBULATORY_CARE_PROVIDER_SITE_OTHER): Payer: BC Managed Care – PPO | Admitting: Internal Medicine

## 2011-08-22 ENCOUNTER — Encounter: Payer: Self-pay | Admitting: Internal Medicine

## 2011-08-22 VITALS — BP 148/86 | HR 50 | Temp 97.7°F | Ht 70.25 in | Wt 229.0 lb

## 2011-08-22 DIAGNOSIS — Z2911 Encounter for prophylactic immunotherapy for respiratory syncytial virus (RSV): Secondary | ICD-10-CM

## 2011-08-22 DIAGNOSIS — I10 Essential (primary) hypertension: Secondary | ICD-10-CM

## 2011-08-22 DIAGNOSIS — E119 Type 2 diabetes mellitus without complications: Secondary | ICD-10-CM

## 2011-08-22 DIAGNOSIS — Z23 Encounter for immunization: Secondary | ICD-10-CM

## 2011-08-22 DIAGNOSIS — Z0000 Encounter for general adult medical examination without abnormal findings: Secondary | ICD-10-CM

## 2011-08-22 DIAGNOSIS — J45909 Unspecified asthma, uncomplicated: Secondary | ICD-10-CM

## 2011-08-22 LAB — CBC WITH DIFFERENTIAL/PLATELET
Eosinophils Relative: 2.7 % (ref 0.0–5.0)
HCT: 40.8 % (ref 39.0–52.0)
Hemoglobin: 13.6 g/dL (ref 13.0–17.0)
Lymphs Abs: 1.8 10*3/uL (ref 0.7–4.0)
Monocytes Relative: 6.7 % (ref 3.0–12.0)
Neutro Abs: 4.6 10*3/uL (ref 1.4–7.7)
WBC: 7.1 10*3/uL (ref 4.5–10.5)

## 2011-08-22 LAB — BASIC METABOLIC PANEL
CO2: 29 mEq/L (ref 19–32)
Calcium: 8.9 mg/dL (ref 8.4–10.5)
Creatinine, Ser: 1 mg/dL (ref 0.4–1.5)
Glucose, Bld: 96 mg/dL (ref 70–99)

## 2011-08-22 LAB — LIPID PANEL: Total CHOL/HDL Ratio: 3

## 2011-08-22 LAB — HEMOGLOBIN A1C: Hgb A1c MFr Bld: 5.9 % (ref 4.6–6.5)

## 2011-08-22 NOTE — Progress Notes (Signed)
  Subjective:    Patient ID: Kirk Sutton, male    DOB: 10-Oct-1950, 61 y.o.   MRN: [id number]  HPI Complete physical exam In general doing very well, he has been very concerned lately because his wife's health (cirrhosis? Undergoing a workup). Also his daughter is going through a divorce.  Past Medical History  Diagnosis Date  . Asthma   . Hypertension   . Allergic rhinitis   . Hyperlipidemia   . Chronic prostatitis     sees urology  . Diabetes mellitus     type 11 (a1c 6.0 03/2008)  . Psoriasis     @ hands, sees derm   Past Surgical History  Procedure Date  . Hernia repair   . Appendectomy   . Fertility surgery 1979  . Testicle surgery     undescended repair   . Shoulder surgery 2008    left  . Nasal septum surgery 2009   History   Social History  . Marital Status: Married    Spouse Name: N/A    Number of Children: 2  . Years of Education: N/A   Occupational History  . Water treatment Dispensing optician    Social History Main Topics  . Smoking status: Never Smoker   . Smokeless tobacco: Never Used  . Alcohol Use: Yes     socially  . Drug Use: No  . Sexually Active: Not on file   Other Topics Concern  . Not on file   Social History Narrative   Exercise- golf, walking 2-3 times weekly, Diet-- watching    Family History  Problem Relation Age of Onset  . Diabetes Sister   . Hypertension Mother   . Hypertension Sister   . Heart attack Father     x 3 onset age 11, died at 77  . Colon polyps Mother   . Colon polyps Father   . Colon cancer Neg Hx   . Prostate cancer Neg Hx   . Cancer Father     bladder    Review of Systems No chest pain or shortness of breath No nausea, vomiting, diarrhea or blood in the stools. He sees dermatology routinely. He sees urology routinely. Hardly ever uses a inhaler     Objective:   Physical Exam  General -- alert, well-developed, and slt overweight appearing. No apparent distress.  Neck --no  thyromegaly Lungs -- normal respiratory effort, no intercostal retractions, no accessory muscle use, and normal breath sounds.   Heart-- normal rate, regular rhythm, no murmur, and no gallop.   Abdomen--soft, non-tender, no distention, no masses, no HSM, no guarding, and no rigidity.   DIABETIC FEET EXAM: No lower extremity edema Normal pedal pulses bilaterally Skin and nails are normal without calluses Pinprick examination of the feet normal. Neurologic-- alert & oriented X3 and strength normal in all extremities. Psych-- Cognition and judgment appear intact. Alert and cooperative with normal attention span and concentration.  not anxious appearing and not depressed appearing.      Assessment & Plan:  In addition to his yearly checkup, we spent more than managing his chronic medical issues as well as counseling about stress

## 2011-08-22 NOTE — Assessment & Plan Note (Addendum)
A1c is has been consistently good. Check A1c today. If it is normal, we'll followup in one year. Feet exam normal today. He sees Dr. Hazle Quant routinely for eye care.

## 2011-08-22 NOTE — Assessment & Plan Note (Signed)
Good medication compliance, BP today 140/86, BP at his dentist not long ago was 120/70. Thinks BP slightly elevated today due to to stress. See instructions. Follow up in one year.

## 2011-08-22 NOTE — Assessment & Plan Note (Signed)
- 

## 2011-08-22 NOTE — Patient Instructions (Signed)
Check the  blood pressure 2 or 3 times a month, be sure it is between 110/60 and 140/85. If it is consistently higher or lower, let me know  

## 2011-08-22 NOTE — Assessment & Plan Note (Addendum)
Td 2003 and today Benefits of a Zostavax discuss he likes to get that done. Shot provided today. Discussed diet and exercise Has a colonoscopy schedule for July 2013 with Dr. Leda Quail in [facility]. Having some stress, see history of present illness. Denies any to the new medication at this point but encouraged to call me if needed. Sees urology yearly around October.

## 2011-08-23 ENCOUNTER — Encounter: Payer: Self-pay | Admitting: *Deleted

## 2011-09-10 ENCOUNTER — Other Ambulatory Visit: Payer: Self-pay | Admitting: Internal Medicine

## 2011-09-10 MED ORDER — METOPROLOL SUCCINATE ER 50 MG PO TB24: 50.0000 mg | ORAL_TABLET | Freq: Every day | ORAL | Status: DC

## 2011-09-10 NOTE — Telephone Encounter (Signed)
Refill done.  

## 2011-09-10 NOTE — Telephone Encounter (Signed)
refill metoprolol suc er tab 50MG  Tablet , requesting 90-day supply, take one by mouth daily, last wrt 5.30.12, last ov 6.5.13

## 2011-09-17 MED ORDER — METOPROLOL SUCCINATE ER 50 MG PO TB24: 50.0000 mg | ORAL_TABLET | Freq: Every day | ORAL | Status: DC

## 2011-09-17 NOTE — Addendum Note (Signed)
Addended by: Candie Echevaria L on: 09/17/2011 10:26 AM   Modules accepted: Orders

## 2011-09-17 NOTE — Telephone Encounter (Signed)
Pt left VM that his pharmacy has contacted [place] and we have not responded. Advise Pt Rx sent to incorrect pharmacy but Rx has been resent to correct pharmacy and Rx has been sent in to local pharmacy in case he run out of med before mail order arrive. Pt ok info

## 2011-10-25 ENCOUNTER — Other Ambulatory Visit: Payer: Self-pay | Admitting: *Deleted

## 2011-10-25 MED ORDER — HYDROCHLOROTHIAZIDE 25 MG PO TABS: 25.0000 mg | ORAL_TABLET | Freq: Every day | ORAL | Status: DC

## 2011-10-25 MED ORDER — TERAZOSIN HCL 1 MG PO CAPS: ORAL_CAPSULE | ORAL | Status: DC

## 2011-10-25 NOTE — Telephone Encounter (Signed)
Pt aware, Rx sent. 

## 2011-12-18 ENCOUNTER — Telehealth: Payer: Self-pay

## 2011-12-18 MED ORDER — FLUTICASONE PROPIONATE 50 MCG/ACT NA SUSP: 2.0000 | Freq: Every day | NASAL | Status: DC

## 2011-12-18 NOTE — Telephone Encounter (Signed)
Rx sent Pt aware.    MW  

## 2012-01-10 ENCOUNTER — Ambulatory Visit (INDEPENDENT_AMBULATORY_CARE_PROVIDER_SITE_OTHER): Payer: BC Managed Care – PPO

## 2012-01-10 DIAGNOSIS — Z23 Encounter for immunization: Secondary | ICD-10-CM

## 2012-02-21 ENCOUNTER — Ambulatory Visit (INDEPENDENT_AMBULATORY_CARE_PROVIDER_SITE_OTHER): Payer: BC Managed Care – PPO | Admitting: Internal Medicine

## 2012-02-21 VITALS — BP 146/88 | HR 55 | Temp 97.7°F | Wt 234.0 lb

## 2012-02-21 DIAGNOSIS — J45909 Unspecified asthma, uncomplicated: Secondary | ICD-10-CM

## 2012-02-21 DIAGNOSIS — J329 Chronic sinusitis, unspecified: Secondary | ICD-10-CM

## 2012-02-21 DIAGNOSIS — I10 Essential (primary) hypertension: Secondary | ICD-10-CM

## 2012-02-21 MED ORDER — FLUTICASONE PROPIONATE 50 MCG/ACT NA SUSP: 2.0000 | Freq: Every day | NASAL | Status: DC

## 2012-02-21 MED ORDER — AMOXICILLIN 500 MG PO CAPS: 1000.0000 mg | ORAL_CAPSULE | Freq: Two times a day (BID) | ORAL | Status: DC

## 2012-02-21 NOTE — Progress Notes (Signed)
  Subjective:    Patient ID: Kirk Sutton, male    DOB: 1951/02/18, 61 y.o.   MRN: [id number]  HPI Acute visit Symptoms started about a week ago, sore throat, sinus congestion, green bloody discharge from the nose every morning. Before the onset of symptoms he was   using albuterol rarely and now has used it 5 or 6 times this week.  Past Medical History  Diagnosis Date  . Asthma   . Hypertension   . Allergic rhinitis   . Hyperlipidemia   . Chronic prostatitis     sees urology  . Diabetes mellitus     type 11 (a1c 6.0 03/2008)  . Psoriasis     @ hands, sees derm   Past Surgical History  Procedure Date  . Hernia repair   . Appendectomy   . Fertility surgery 1979  . Testicle surgery     undescended repair   . Shoulder surgery 2008    left  . Nasal septum surgery 2009   Patient is a nonsmoker    Review of Systems No fever or chills No nausea, vomiting, diarrhea. Some cough mostly at night with mild sputum production. BP today slightly elevated, he checks frequently at home and is about 120/80 consistently    Objective:   Physical Exam  General -- alert, well-developed, and well-nourished.   HEENT -- TMs normal, throat w/o redness, face symmetric and slt tender to palpation at maxilary area, not at the frontal sinuses Lungs -- normal respiratory effort, no intercostal retractions, no accessory muscle use, and few ronchi. Heart-- normal rate, regular rhythm, no murmur, and no gallop.   Neurologic-- alert & oriented X3 and strength normal in all extremities. Psych-- Cognition and judgment appear intact. Alert and cooperative with normal attention span and concentration.  not anxious appearing and not depressed appearing.      Assessment & Plan:  Mild sinusitis, see instructions

## 2012-02-21 NOTE — Assessment & Plan Note (Addendum)
Chronic symptoms well-controlled, hardly ever used albuterol. Mild exacerbation the last few day, recommend to call if not back to baseline soon.

## 2012-02-21 NOTE — Patient Instructions (Addendum)
Rest, fluids , tylenol For cough, take Mucinex DM twice a day as needed  Use the nasal spray consistently Take the antibiotic as prescribed  (Amoxicillin) Call if no better in few days Call anytime if the symptoms are severe

## 2012-02-21 NOTE — Assessment & Plan Note (Signed)
BP slightly elevated today but reports excellent ambulatory BPs

## 2012-02-22 ENCOUNTER — Encounter: Payer: Self-pay | Admitting: Internal Medicine

## 2012-03-16 ENCOUNTER — Other Ambulatory Visit: Payer: Self-pay | Admitting: Internal Medicine

## 2012-03-17 NOTE — Telephone Encounter (Signed)
Refill done.  

## 2012-04-22 ENCOUNTER — Other Ambulatory Visit: Payer: Self-pay | Admitting: *Deleted

## 2012-04-22 MED ORDER — TERAZOSIN HCL 1 MG PO CAPS: ORAL_CAPSULE | ORAL | Status: DC

## 2012-04-22 MED ORDER — HYDROCHLOROTHIAZIDE 25 MG PO TABS: 25.0000 mg | ORAL_TABLET | Freq: Every day | ORAL | Status: DC

## 2012-04-22 NOTE — Telephone Encounter (Signed)
Rx sent 

## 2012-07-15 ENCOUNTER — Encounter: Payer: Self-pay | Admitting: Internal Medicine

## 2012-07-15 ENCOUNTER — Ambulatory Visit (INDEPENDENT_AMBULATORY_CARE_PROVIDER_SITE_OTHER): Payer: BC Managed Care – PPO | Admitting: Internal Medicine

## 2012-07-15 VITALS — BP 132/80 | HR 53 | Temp 98.1°F | Wt 233.0 lb

## 2012-07-15 DIAGNOSIS — R05 Cough: Secondary | ICD-10-CM

## 2012-07-15 DIAGNOSIS — F411 Generalized anxiety disorder: Secondary | ICD-10-CM

## 2012-07-15 NOTE — Patient Instructions (Addendum)
Please make an appointment with one of the [facility] counselor, either Raynelle Fanning or Dr Dellia Cloud Continue with Mucinex DM, Claritin, bro air as needed, Flonase. If the symptoms persist more than one week please let me know. Come back for a complete physical exam around 08/21/2012 or later

## 2012-07-15 NOTE — Assessment & Plan Note (Signed)
Anxiety? See HPI I am not sure if the patient has anxiety, he certainly has no depression. 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 I also wonder about bipolar d/o I like him to See a counselor, clarify the diagnoses. See instructions. I also asked the patient to call me anytime if he feels he needs something, he may benefit from a SSRI after all.

## 2012-07-15 NOTE — Progress Notes (Signed)
  Subjective:    Patient ID: Kirk Sutton, male    DOB: 05/14/1950, 62 y.o.   MRN: [id number]  HPI Here with 2 concerns 2 weeks history of cough, sneezing, occasional chest congestion. He is using Claritin, Flonase, albuterol as needed. He is started to take Mucinex DM and is slightly better.  Also his wife asked him to be evaluated for possible anxiety or depression: their daughter is going through a divorce, the patient himself is extremely opinionated, "flies off the handle" sometimes, has hurt people with his comments. She wonders if Anirudh needs a medication.  He denies problems with sleeping, he's not depressed, he is doing great at his job. Denies symptoms of anxiety such as feeling jittery or nervous. On further questioning, he can not see what his wife is expressing, seems like it has been a intense person all his life. I asked him about severe depression in the past thinking that he may be bipolar but he denies major episodes of depression, of course there has been times in his life tah he felt  Sad.  Past Medical History  Diagnosis Date  . Asthma   . Hypertension   . Allergic rhinitis   . Hyperlipidemia   . Chronic prostatitis     sees urology  . Diabetes mellitus     type 11 (a1c 6.0 03/2008)  . Psoriasis     @ hands, sees derm   Past Surgical History  Procedure Laterality Date  . Hernia repair    . Appendectomy    . Fertility surgery  1979  . Testicle surgery      undescended repair   . Shoulder surgery  2008    left  . Nasal septum surgery  2009    Review of Systems Denies fever chills, he did feel slightly tired in the last few days. He admits to sneezing but no itchy eyes or nose. He has some clear postnasal dripping. Denies suicidal or homicidal ideas.    Objective:   Physical Exam BP 132/80  Pulse 53  Temp(Src) 98.1 F (36.7 C) (Oral)  Wt 233 lb (105.688 kg)  BMI 33.21 kg/m2  SpO2 95%  General -- alert, well-developed, No emotional or physical  distress  HEENT -- TMs normal, throat w/o redness, face symmetric and not tender to palpation, Nose is slightly congested Lungs -- normal respiratory effort, no intercostal retractions, no accessory muscle use, and normal breath sounds. No wheezing. Heart-- normal rate, regular rhythm, no murmur, and no gallop.    Neurologic-- alert & oriented X3 and strength normal in all extremities. Psych-- Cognition and judgment appear intact. Alert and cooperative with normal attention span and concentration. He is certainly a intense person very talkative, today I find him  at baseline. PHQ 9 = zero     Assessment & Plan:   Cough  Most likely due to allergies, see instructions.  Today , I spent more than 25  min with the patient, >50% of the time counseli

## 2012-07-23 ENCOUNTER — Telehealth: Payer: Self-pay | Admitting: Internal Medicine

## 2012-07-23 MED ORDER — TERAZOSIN HCL 1 MG PO CAPS: 3.0000 mg | ORAL_CAPSULE | Freq: Every day | ORAL | Status: DC

## 2012-07-23 MED ORDER — HYDROCHLOROTHIAZIDE 25 MG PO TABS: 25.0000 mg | ORAL_TABLET | Freq: Every day | ORAL | Status: DC

## 2012-07-23 NOTE — Telephone Encounter (Signed)
Refill done.  

## 2012-07-23 NOTE — Telephone Encounter (Signed)
Patient called requesting new rx for hctz and terazosin be sent to [facility].

## 2012-07-29 ENCOUNTER — Ambulatory Visit: Payer: BC Managed Care – PPO | Admitting: Licensed Clinical Social Worker

## 2012-08-22 ENCOUNTER — Encounter: Payer: Self-pay | Admitting: Internal Medicine

## 2012-08-22 ENCOUNTER — Ambulatory Visit (INDEPENDENT_AMBULATORY_CARE_PROVIDER_SITE_OTHER): Payer: BC Managed Care – PPO | Admitting: Internal Medicine

## 2012-08-22 VITALS — BP 130/82 | HR 51 | Temp 98.3°F | Ht 70.5 in | Wt 232.0 lb

## 2012-08-22 DIAGNOSIS — F411 Generalized anxiety disorder: Secondary | ICD-10-CM

## 2012-08-22 DIAGNOSIS — E119 Type 2 diabetes mellitus without complications: Secondary | ICD-10-CM

## 2012-08-22 DIAGNOSIS — J45909 Unspecified asthma, uncomplicated: Secondary | ICD-10-CM

## 2012-08-22 DIAGNOSIS — Z0000 Encounter for general adult medical examination without abnormal findings: Secondary | ICD-10-CM

## 2012-08-22 LAB — CBC WITH DIFFERENTIAL/PLATELET
Basophils Absolute: 0 10*3/uL (ref 0.0–0.1)
Basophils Relative: 0.4 % (ref 0.0–3.0)
Eosinophils Absolute: 0.1 10*3/uL (ref 0.0–0.7)
Lymphocytes Relative: 27.1 % (ref 12.0–46.0)
MCHC: 34.1 g/dL (ref 30.0–36.0)
MCV: 94.4 fl (ref 78.0–100.0)
Monocytes Absolute: 0.5 10*3/uL (ref 0.1–1.0)
Neutro Abs: 3.7 10*3/uL (ref 1.4–7.7)
Neutrophils Relative %: 61.7 % (ref 43.0–77.0)
RDW: 13.4 % (ref 11.5–14.6)

## 2012-08-22 LAB — LIPID PANEL
HDL: 40 mg/dL (ref >39.00)
Total CHOL/HDL Ratio: 4
Triglycerides: 94 mg/dL (ref 0.0–149.0)

## 2012-08-22 LAB — COMPREHENSIVE METABOLIC PANEL
ALT: 17 U/L (ref 0–53)
AST: 24 U/L (ref 0–37)
Albumin: 3.9 g/dL (ref 3.5–5.2)
Alkaline Phosphatase: 49 U/L (ref 39–117)
Calcium: 9 mg/dL (ref 8.4–10.5)
Chloride: 101 mEq/L (ref 96–112)
Potassium: 3.7 mEq/L (ref 3.5–5.1)
Sodium: 138 mEq/L (ref 135–145)
Total Protein: 6.8 g/dL (ref 6.0–8.3)

## 2012-08-22 LAB — HEMOGLOBIN A1C: Hgb A1c MFr Bld: 6 % (ref 4.6–6.5)

## 2012-08-22 NOTE — Assessment & Plan Note (Signed)
Hardly ever  uses albuterol 

## 2012-08-22 NOTE — Progress Notes (Signed)
  Subjective:    Patient ID: Kirk Sutton, male    DOB: 10-03-1950, 62 y.o.   MRN: [id number]  HPI CPX  Past Medical History  Diagnosis Date  . Asthma   . Hypertension   . Allergic rhinitis   . Hyperlipidemia   . Chronic prostatitis     sees urology  . Diabetes mellitus     type 11 (a1c 6.0 03/2008)  . Psoriasis     @ hands, sees derm   Past Surgical History  Procedure Laterality Date  . Hernia repair    . Appendectomy    . Fertility surgery  1979  . Testicle surgery      undescended repair   . Shoulder surgery  2008    left  . Nasal septum surgery  2009   History   Social History  . Marital Status: Married    Spouse Name: N/A    Number of Children: 2  . Years of Education: N/A   Occupational History  . Water treatment Dispensing optician    Social History Main Topics  . Smoking status: Never Smoker   . Smokeless tobacco: Never Used  . Alcohol Use: Yes     Comment: socially  . Drug Use: No  . Sexually Active: Not on file   Other Topics Concern  . Not on file   Social History Narrative  . No narrative on file   Family History  Problem Relation Age of Onset  . Diabetes Sister   . Hypertension Mother   . Hypertension Sister   . Heart attack Father     x 3 onset age 41, died at 50  . Colon polyps Mother   . Colon polyps Father   . Colon cancer Neg Hx   . Prostate cancer Neg Hx   . Cancer Father     bladder    Review of Systems Exercise---> golf, walking 2-3 times weekly  Diet---> start a new healthy diet soon  Good compliance with all medications, BP is checked at the [facility], always within normal. Denies chest pain, shortness of breath. No nausea, vomiting, diarrhea or blood in the stools. No dysuria or gross hematuria.     Objective:   Physical Exam BP 130/82  Pulse 51  Temp(Src) 98.3 F (36.8 C) (Oral)  Ht 5' 10.5" (1.791 m)  Wt 232 lb (105.235 kg)  BMI 32.81 kg/m2  SpO2 96%  General -- alert, well-developed, nad Neck --no  thyromegaly , normal carotid pulse Lungs -- normal respiratory effort, no intercostal retractions, no accessory muscle use, and normal breath sounds.   Heart-- normal rate, regular rhythm, no murmur, and no gallop.   Abdomen--soft, non-tender, no distention, no masses, no HSM, no guarding, and no rigidity.   Extremities-- no pretibial edema bilaterally Neurologic-- alert & oriented X3 and strength normal in all extremities. Psych-- Cognition and judgment appear intact. Alert and cooperative with normal attention span and concentration.  No obvious anxiety-depression     Assessment & Plan:

## 2012-08-22 NOTE — Assessment & Plan Note (Addendum)
Td 2013  Zostavax 2013 Discussed diet and exercise EKG-- brady, at baseline Colonoscopy Dr Luther Parody 2009 (-), had a Cscope  schedule for July 2013 dut did not proceed ($), plans to call GI Cont healthy life style Sees urology yearly around October. Chronic medical problems seem stable, continue with same medications, labs

## 2012-08-22 NOTE — Assessment & Plan Note (Signed)
Improved, decided not to see a counselor but  is doing better.

## 2012-08-23 ENCOUNTER — Encounter: Payer: Self-pay | Admitting: Internal Medicine

## 2012-08-27 ENCOUNTER — Ambulatory Visit (INDEPENDENT_AMBULATORY_CARE_PROVIDER_SITE_OTHER): Payer: BC Managed Care – PPO | Admitting: Internal Medicine

## 2012-08-27 ENCOUNTER — Encounter: Payer: Self-pay | Admitting: Internal Medicine

## 2012-08-27 VITALS — BP 136/84 | HR 62 | Temp 98.3°F | Wt 229.0 lb

## 2012-08-27 DIAGNOSIS — IMO0002 Reserved for concepts with insufficient information to code with codable children: Secondary | ICD-10-CM

## 2012-08-27 DIAGNOSIS — T304 Corrosion of unspecified body region, unspecified degree: Secondary | ICD-10-CM

## 2012-08-27 DIAGNOSIS — T300 Burn of unspecified body region, unspecified degree: Secondary | ICD-10-CM

## 2012-08-27 MED ORDER — PREDNISONE 10 MG PO TABS: ORAL_TABLET | ORAL | Status: DC

## 2012-08-27 NOTE — Patient Instructions (Addendum)
Keep the areas clean and dry, take at least one showet daily. Prednisone for a few days Zyrtec or Claritin OTC: one tablet daily  for itching. Moisturize the skin with Aveeno lotion. Avoid  excessive sweating   Burns can get infected, call anytime if you feel feverish, the redness is spreading, you see pus instead of yellowish discharge from the blisters   Chemical Burn Many chemicals can burn the skin. A chemical burn should be flushed with cool water and checked by an emergency caregiver. Your skin is a natural barrier to infection. It is the largest organ of your body. Burns damage this natural protection. To help prevent infection, it is very important to follow your caregiver's instructions in the care of your burn.  Many industrial chemicals may cause burns. These chemicals include acids, alkalis, and organic compounds such as petroleum, phenol, bitumen, tar, and grease. When acids come in contact with the skin, they cause an immediate change in the skin.Acid burns produce significant pain and form a scab (eschar). Usually, the immediate skin changes are the only damage from an acid burn.However, exposure to formic acid, chromic acid, or hydrofluoric acid may affect the whole body and may even be life-threatening. Alkalis include lye, cement, lime, and many chemicals with "hydroxide" in their name.An alkali burn may be less apparent than an acid burn at first. However, alkalis may cause greater tissue damage.It is important to be aware of any chemicals you are using. Treat any exposure to skin, eyes, or mucous membranes (nose, mouth, throat) as a potential emergency. PREVENTION  Avoid exposure to toxic chemicals that can cause burns.  Store chemicals out of the reach of children.  Use protective gloves when handling dangerous chemicals. HOME CARE INSTRUCTIONS   Wash your hands well before changing your bandage.  Change your bandage as often as directed by your caregiver.  Remove  the old bandage. If the bandage sticks, you may soak it off with cool, clean water.  Cleanse the burn thoroughly but gently with mild soap and water.  Pat the area dry with a clean, dry cloth.  Apply a thin layer of antibacterial cream to the burn.  Apply a clean bandage as instructed by your caregiver.  Keep the bandage as clean and dry as possible.  Elevate the affected area for the first 24 hours, then as instructed by your caregiver.  Only take over-the-counter or prescription medicines for pain, discomfort, or fever as directed by your caregiver.  Keep all follow-up appointments.This is important. This is how your caregiver can tell if your treatment is working. SEEK IMMEDIATE MEDICAL CARE IF:   You develop excessive pain.  You develop redness, tenderness, swelling, or red streaks near the burn.  The burned area develops yellowish-white fluid (pus) or a bad smell.  You have a fever. MAKE SURE YOU:   Understand these instructions.  Will watch your condition.  Will get help right away if you are not doing well or get worse. Document Released: 12/10/2003 Document Revised: 05/28/2011 Document Reviewed: 07/31/2010 [facility] Patient Information 2014 [place], [place].

## 2012-08-27 NOTE — Progress Notes (Signed)
  Subjective:    Patient ID: Kirk Sutton, male    DOB: 01-19-1951, 62 y.o.   MRN: [id number]  HPI Acute visit  Got burn w/ 2 chemicals at work two days ago: Bellacide 355 and ISO 15 CWT. He got me some information about the chemicals, they are corrosive and cause skin burns. The right treatment was wash the skin with water and in the case of ISO also soap . Only the skin was affected, no eye contact, ingestion or fumes. He wash the skin profusely with water only.  Past Medical History  Diagnosis Date  . Asthma   . Hypertension   . Allergic rhinitis   . Hyperlipidemia   . Chronic prostatitis     sees urology  . Diabetes mellitus     type 11 (a1c 6.0 03/2008)  . Psoriasis     @ hands, sees derm   Past Surgical History  Procedure Laterality Date  . Hernia repair    . Appendectomy    . Fertility surgery  1979  . Testicle surgery      undescended repair   . Shoulder surgery  2008    left  . Nasal septum surgery  2009   History   Social History  . Marital Status: Married    Spouse Name: N/A    Number of Children: 2  . Years of Education: N/A   Occupational History  . Water treatment Dispensing optician    Social History Main Topics  . Smoking status: Never Smoker   . Smokeless tobacco: Never Used  . Alcohol Use: Yes     Comment: socially  . Drug Use: No  . Sexually Active: Not on file   Other Topics Concern  . Not on file   Social History Narrative  . No narrative on file     Review of Systems Complain itching at the area of the burns but denies any headaches, fever, chills. No nausea, vomiting, diarrhea. Or shortness or breath or wheezing.     Objective:   Physical Exam  Skin:       General -- alert, well-developed, NAD .    Neurologic-- alert & oriented X3 and strength normal in all extremities. Psych-- Cognition and judgment appear intact. Alert and cooperative with normal attention span and concentration.  not anxious appearing and not  depressed appearing.       Assessment & Plan:  Chemical  burn See history of present illness, status post a chemical burn without systemic exposure or symptoms, no eye involvement. He already took the first   steps which was irrigate skin profusely with water, did not use soap. Plan: Prednisone by mouth to help w/ itching See instructions

## 2012-09-01 ENCOUNTER — Telehealth: Payer: Self-pay | Admitting: Internal Medicine

## 2012-09-01 NOTE — Telephone Encounter (Signed)
Patient is calling because he has finished using his Prednisone for his rash but says the area that he was using it for is still itching. Patient says the rash has healed up tremendously. Patient wants to know whether Dr. Drue Novel wanted to refill the Prednisine for him to keep taking it for the itching or not.

## 2012-09-01 NOTE — Telephone Encounter (Signed)
Please advise 

## 2012-09-02 MED ORDER — PREDNISONE 20 MG PO TABS: 20.0000 mg | ORAL_TABLET | Freq: Every day | ORAL | Status: DC

## 2012-09-02 NOTE — Telephone Encounter (Signed)
Call Prednisone 20 mg one tablet daily for 5 days #5, no RF

## 2012-09-02 NOTE — Telephone Encounter (Signed)
Refill done.  

## 2012-10-20 ENCOUNTER — Telehealth: Payer: Self-pay | Admitting: Internal Medicine

## 2012-10-20 MED ORDER — HYDROCHLOROTHIAZIDE 25 MG PO TABS: 25.0000 mg | ORAL_TABLET | Freq: Every day | ORAL | Status: DC

## 2012-10-20 MED ORDER — TERAZOSIN HCL 1 MG PO CAPS: 3.0000 mg | ORAL_CAPSULE | Freq: Every day | ORAL | Status: DC

## 2012-10-20 NOTE — Telephone Encounter (Signed)
Ok 6 month supply per verbal order Dr. Drue Novel on both medications. rx sent. Original rx sent to [facility]. Contacted pharmacy and cancelled rx. Resent to [facility] per request of patient. Left message on cell phone per DPR rx sent.

## 2012-10-20 NOTE — Telephone Encounter (Signed)
Patient left vm on triage line stating he needs [place] to send rx for hctz and terazosin to [facility].

## 2012-12-03 ENCOUNTER — Other Ambulatory Visit: Payer: Self-pay | Admitting: *Deleted

## 2012-12-03 MED ORDER — METOPROLOL SUCCINATE ER 50 MG PO TB24: 50.0000 mg | ORAL_TABLET | Freq: Every day | ORAL | Status: DC

## 2012-12-03 NOTE — Telephone Encounter (Signed)
Rx refilled for metorpolol 50 mg.  Ag cma

## 2013-01-20 ENCOUNTER — Ambulatory Visit (INDEPENDENT_AMBULATORY_CARE_PROVIDER_SITE_OTHER): Payer: BC Managed Care – PPO

## 2013-01-20 DIAGNOSIS — Z23 Encounter for immunization: Secondary | ICD-10-CM

## 2013-02-06 ENCOUNTER — Ambulatory Visit (INDEPENDENT_AMBULATORY_CARE_PROVIDER_SITE_OTHER): Payer: BC Managed Care – PPO | Admitting: Internal Medicine

## 2013-02-06 ENCOUNTER — Encounter: Payer: Self-pay | Admitting: Internal Medicine

## 2013-02-06 VITALS — BP 126/77 | HR 70 | Temp 100.4°F | Resp 18 | Wt 230.0 lb

## 2013-02-06 DIAGNOSIS — J209 Acute bronchitis, unspecified: Secondary | ICD-10-CM

## 2013-02-06 MED ORDER — HYDROCODONE-HOMATROPINE 5-1.5 MG/5ML PO SYRP: 5.0000 mL | ORAL_SOLUTION | Freq: Every evening | ORAL | Status: DC | PRN

## 2013-02-06 MED ORDER — DOXYCYCLINE HYCLATE 100 MG PO TABS: 100.0000 mg | ORAL_TABLET | Freq: Two times a day (BID) | ORAL | Status: DC

## 2013-02-06 NOTE — Progress Notes (Signed)
  Subjective:    Patient ID: Kirk Sutton, male    DOB: 02-16-1951, 62 y.o.   MRN: [id number]  HPI Acute visit Symptoms started 10 days ago with cough which is mostly dry, from time to time he sees a small amount of mucus, x 1 time saw few drops of red blood mixed with mucus. The cough is intense and sometimes is unable to sleep. Is taking Mucinex DM and using his inhalers twice a day, prior to getting sick, he did not require inhalers. Wife is sick with upper respiratory problems  Past Medical History  Diagnosis Date  . Asthma   . Hypertension   . Allergic rhinitis   . Hyperlipidemia   . Chronic prostatitis     sees urology  . Diabetes mellitus     type 11 (a1c 6.0 03/2008)  . Psoriasis     @ hands, sees derm   Past Surgical History  Procedure Laterality Date  . Hernia repair    . Appendectomy    . Fertility surgery  1979  . Testicle surgery      undescended repair   . Shoulder surgery  2008    left  . Nasal septum surgery  2009   History  Substance Use Topics  . Smoking status: Never Smoker   . Smokeless tobacco: Never Used  . Alcohol Use: Yes     Comment: socially   .soc Review of Systems No fever at home but here he has increased temperature. Some chills. Had diarrhea one time that self resolved. No chest pain, shortness of breath. Some fatigue      Objective:   Physical Exam BP 126/77  Pulse 70  Temp(Src) 100.4 F (38 C) (Tympanic)  Resp 18  Wt 230 lb (104.327 kg)  SpO2 94% General -- alert, well-developed, NAD.    HEENT-- Not pale. TMs normal, throat symmetric, no redness or discharge. Face symmetric, sinuses not tender to palpation. Nose slt  congested.   Lungs -- normal respiratory effort, no intercostal retractions, no accessory muscle use, Few rhonchi that clear with cough Heart-- normal rate, regular rhythm, no murmur.  Neurologic--  alert & oriented X3. Speech normal, gait normal, strength normal in all extremities.  DTRs symmetric  EOMI,  PERLA   Psych-- Cognition and judgment appear intact. Cooperative with normal attention span and concentration. No anxious appearing , no depressed appearing.         Assessment & Plan:  Bronchitis  Patient with history of asthma, does  not used his albuterol except when he is sick. Presents with symptoms of bronchitis. See instructions Had a flu shot

## 2013-02-06 NOTE — Patient Instructions (Signed)
Rest, fluids , tylenol For cough, take Mucinex DM twice a day as needed  If the cough is severe at night, take hydrocodone, will make you sleepy. Continue using albuterol as needed Take the antibiotic as prescribed  (doxy) Call if no better in few days Call anytime if the symptoms are severe, you have high fever, short of breath, chest pain

## 2013-02-07 ENCOUNTER — Encounter: Payer: Self-pay | Admitting: Internal Medicine

## 2013-02-17 ENCOUNTER — Telehealth: Payer: Self-pay | Admitting: *Deleted

## 2013-02-17 NOTE — Telephone Encounter (Signed)
I could see him tomorrow afternoon

## 2013-02-17 NOTE — Telephone Encounter (Signed)
Called and spoke with patient. Appt was made for 02/18/13 @ 11:30 am

## 2013-02-17 NOTE — Telephone Encounter (Signed)
Pt was seen 11/21 for bronchitis. He states he is still coughing pretty bad. He has an appt on 12/8 and would like to know if he needs to be seen sooner or if something can be called in for him. Please advise.

## 2013-02-18 ENCOUNTER — Ambulatory Visit (HOSPITAL_BASED_OUTPATIENT_CLINIC_OR_DEPARTMENT_OTHER)
Admission: RE | Admit: 2013-02-18 | Discharge: 2013-02-18 | Disposition: A | Discharge status: Home / Self Care | Payer: BC Managed Care – PPO | Source: Ambulatory Visit | Attending: Internal Medicine | Admitting: Internal Medicine

## 2013-02-18 ENCOUNTER — Ambulatory Visit (INDEPENDENT_AMBULATORY_CARE_PROVIDER_SITE_OTHER): Payer: BC Managed Care – PPO | Admitting: Internal Medicine

## 2013-02-18 ENCOUNTER — Encounter: Payer: Self-pay | Admitting: Internal Medicine

## 2013-02-18 VITALS — BP 132/80 | HR 59 | Temp 98.7°F | Wt 229.0 lb

## 2013-02-18 DIAGNOSIS — J40 Bronchitis, not specified as acute or chronic: Secondary | ICD-10-CM

## 2013-02-18 DIAGNOSIS — R059 Cough, unspecified: Secondary | ICD-10-CM | POA: Insufficient documentation

## 2013-02-18 DIAGNOSIS — R05 Cough: Secondary | ICD-10-CM

## 2013-02-18 MED ORDER — HYDROCODONE-HOMATROPINE 5-1.5 MG/5ML PO SYRP: 5.0000 mL | ORAL_SOLUTION | Freq: Every evening | ORAL | Status: DC | PRN

## 2013-02-18 MED ORDER — MOXIFLOXACIN HCL 400 MG PO TABS: 400.0000 mg | ORAL_TABLET | Freq: Every day | ORAL | Status: DC

## 2013-02-18 NOTE — Progress Notes (Signed)
   Subjective:    Patient ID: Kirk Sutton, male    DOB: 1950/04/11, 62 y.o.   MRN: [id number]  HPI CC:  cough. Since the last time he was here, he took doxycycline without apparent problems, he is also taking  Mucinex and hydrocodone. Despite all that he continue with persisting cough throughout the day, cough is described as dry. No sputum production, no fever or hemoptysis.  Past Medical History  Diagnosis Date  . Asthma   . Hypertension   . Allergic rhinitis   . Hyperlipidemia   . Chronic prostatitis     sees urology  . Diabetes mellitus     type 11 (a1c 6.0 03/2008)  . Psoriasis     @ hands, sees derm   Past Surgical History  Procedure Laterality Date  . Hernia repair    . Appendectomy    . Fertility surgery  1979  . Testicle surgery      undescended repair   . Shoulder surgery  2008    left  . Nasal septum surgery  2009   History  Substance Use Topics  . Smoking status: Never Smoker   . Smokeless tobacco: Never Used  . Alcohol Use: No     Comment: socially    Review of Systems Denies chest pain or difficulty breathing ( some sob w/ episodes of persisting cough). Sleeping okay. Occasional wheezing with cough only. Has some postnasal dripping which is clear but no sinus pain. He has been able to do all his activities of daily living, he played golf during the weekend and walked 2 miles.    Objective:   Physical Exam BP 132/80  Pulse 59  Temp(Src) 98.7 F (37.1 C)  Wt 229 lb (103.874 kg)  SpO2 93% General -- alert, well-developed, NAD.   Lungs -- normal respiratory effort, no intercostal retractions, no accessory muscle use; ++ ronchi B, + end exp wheezing, no crackles .  Heart-- normal rate, regular rhythm, no murmur.  Extremities-- no pretibial edema bilaterally  Neurologic--  alert & oriented X3.   Psych-- Cognition and judgment appear intact. Cooperative with normal attention span and concentration. No anxious appearing , no depressed appearing.         Assessment & Plan:  Bronchitis, Recently diagnosed with bronchitis presents with persisting cough, patient is concerned about possible pneumonia. On exam he has abundant rhonchi and some wheezing. Plan: see instructions

## 2013-02-18 NOTE — Progress Notes (Signed)
Pre visit review using our clinic review tool, if applicable. No additional management support is needed unless otherwise documented below in the visit note. 

## 2013-02-18 NOTE — Patient Instructions (Addendum)
Get the XR at [facility], corner of [address] (10 minutes form here); they are open 24/7 [address]  [place], [place] [address] [phone]   Take Mucinex DM twice a day until better Symbicort 2 puffs twice a day until samples ran out If you hear a lot of chest congestion or have a persisting cough: Use albuterol 2 puffs every 6 hours as needed avelox x  5 days Hydrocodone as needed only Call if not improving within the next week, call if cough severe

## 2013-02-23 ENCOUNTER — Ambulatory Visit: Payer: BC Managed Care – PPO | Admitting: Internal Medicine

## 2013-03-11 ENCOUNTER — Encounter: Payer: Self-pay | Admitting: Internal Medicine

## 2013-03-11 ENCOUNTER — Ambulatory Visit (INDEPENDENT_AMBULATORY_CARE_PROVIDER_SITE_OTHER): Payer: BC Managed Care – PPO | Admitting: Internal Medicine

## 2013-03-11 VITALS — BP 157/80 | HR 55 | Temp 98.0°F | Wt 230.0 lb

## 2013-03-11 DIAGNOSIS — E119 Type 2 diabetes mellitus without complications: Secondary | ICD-10-CM

## 2013-03-11 DIAGNOSIS — J45909 Unspecified asthma, uncomplicated: Secondary | ICD-10-CM

## 2013-03-11 DIAGNOSIS — I10 Essential (primary) hypertension: Secondary | ICD-10-CM

## 2013-03-11 LAB — BASIC METABOLIC PANEL
BUN: 15 mg/dL (ref 6–23)
CO2: 28 mEq/L (ref 19–32)
Calcium: 8.9 mg/dL (ref 8.4–10.5)
Chloride: 103 mEq/L (ref 96–112)
Glucose, Bld: 114 mg/dL — ABNORMAL HIGH (ref 70–99)
Potassium: 3.8 mEq/L (ref 3.5–5.1)

## 2013-03-11 NOTE — Assessment & Plan Note (Signed)
Status post recent exacerbation, he took Symbicort temporarily, now is doing better. Plan: Continue with albuterol when necessary, if symptoms resurface he will let me know, we'll consider leave him on  Symbicort for a few months.

## 2013-03-11 NOTE — Assessment & Plan Note (Signed)
BP well-controlled in the ambulatory setting, check a BMP.

## 2013-03-11 NOTE — Progress Notes (Signed)
Pre visit review using our clinic review tool, if applicable. No additional management support is needed unless otherwise documented below in the visit note. 

## 2013-03-11 NOTE — Patient Instructions (Signed)
Get your blood work before you leave  Next visit for a physical exam  fasting, in 6 months  Please make an appointment     Diabetes and Foot Care Diabetes may cause you to have problems because of poor blood supply (circulation) to your feet and legs. This may cause the skin on your feet to become thinner, break easier, and heal more slowly. Your skin may become dry, and the skin may peel and crack. You may also have nerve damage in your legs and feet causing decreased feeling in them. You may not notice minor injuries to your feet that could lead to infections or more serious problems. Taking care of your feet is one of the most important things you can do for yourself.  HOME CARE INSTRUCTIONS  Wear shoes at all times, even in the house. Do not go barefoot. Bare feet are easily injured.  Check your feet daily for blisters, cuts, and redness. If you cannot see the bottom of your feet, use a mirror or ask someone for help.  Wash your feet with warm water (do not use hot water) and mild soap. Then pat your feet and the areas between your toes until they are completely dry. Do not soak your feet as this can dry your skin.  Apply a moisturizing lotion or petroleum jelly (that does not contain alcohol and is unscented) to the skin on your feet and to dry, brittle toenails. Do not apply lotion between your toes.  Trim your toenails straight across. Do not dig under them or around the cuticle. File the edges of your nails with an emery board or nail file.  Do not cut corns or calluses or try to remove them with medicine.  Wear clean socks or stockings every day. Make sure they are not too tight. Do not wear knee-high stockings since they may decrease blood flow to your legs.  Wear shoes that fit properly and have enough cushioning. To break in new shoes, wear them for just a few hours a day. This prevents you from injuring your feet. Always look in your shoes before you put them on to be sure there  are no objects inside.  Do not cross your legs. This may decrease the blood flow to your feet.  If you find a minor scrape, cut, or break in the skin on your feet, keep it and the skin around it clean and dry. These areas may be cleansed with mild soap and water. Do not cleanse the area with peroxide, alcohol, or iodine.  When you remove an adhesive bandage, be sure not to damage the skin around it.  If you have a wound, look at it several times a day to make sure it is healing.  Do not use heating pads or hot water bottles. They may burn your skin. If you have lost feeling in your feet or legs, you may not know it is happening until it is too late.  Make sure your health care provider performs a complete foot exam at least annually or more often if you have foot problems. Report any cuts, sores, or bruises to your health care provider immediately. SEEK MEDICAL CARE IF:   You have an injury that is not healing.  You have cuts or breaks in the skin.  You have an ingrown nail.  You notice redness on your legs or feet.  You feel burning or tingling in your legs or feet.  You have pain or cramps in  your legs and feet.  Your legs or feet are numb.  Your feet always feel cold. SEEK IMMEDIATE MEDICAL CARE IF:   There is increasing redness, swelling, or pain in or around a wound.  There is a red line that goes up your leg.  Pus is coming from a wound.  You develop a fever or as directed by your health care provider.  You notice a bad smell coming from an ulcer or wound. Document Released: 03/02/2000 Document Revised: 11/05/2012 Document Reviewed: 08/12/2012 [facility] Patient Information 2014 [place], [place].

## 2013-03-11 NOTE — Assessment & Plan Note (Addendum)
Feet exam negative today, feet care discussed. A1c's have been stable over time, recheck on return to the office. Diet and exercise discussed

## 2013-03-11 NOTE — Progress Notes (Signed)
   Subjective:    Patient ID: Kirk Sutton, male    DOB: 02-Sep-1950, 62 y.o.   MRN: [id number]  HPI 6 month f/u Hypertension\good medication compliance, BP today slightly elevated, he checked his BP frequently at the [facility] and is always good, around 120-130. Diabetes-diet controlled, not ambulatory CBGs, he has been able to keep his weight about the same. Asthma--recently seen with Bronchitis, he used Symbicort for a while. He is doing better, has not required albuterol lately, he is using Mucinex sometimes and  Flonase daily  Past Medical History  Diagnosis Date  . Asthma   . Hypertension   . Allergic rhinitis   . Hyperlipidemia   . Chronic prostatitis     sees urology  . Diabetes mellitus     type 11 (a1c 6.0 03/2008)  . Psoriasis     @ hands, sees derm   Past Surgical History  Procedure Laterality Date  . Hernia repair    . Appendectomy    . Fertility surgery  1979  . Testicle surgery      undescended repair   . Shoulder surgery  2008    left  . Nasal septum surgery  2009     Review of Systems Denies chest pain, no difficulty breathing Denies nausea, vomiting, diarrhea or blood in the stools    Objective:   Physical Exam BP 157/80  Pulse 55  Temp(Src) 98 F (36.7 C)  Wt 230 lb (104.327 kg)  SpO2 95%  General -- alert, well-developed, NAD.  Lungs -- normal respiratory effort, no intercostal retractions, no accessory muscle use, and normal breath sounds.  Heart-- normal rate, regular rhythm, no murmur.  DIABETIC FEET EXAM: No lower extremity edema Normal pedal pulses bilaterally Skin normal, nails normal, no calluses Pinprick examination of the feet normal. Neurologic--  alert & oriented X3. Speech normal, gait normal, strength normal in all extremities.  Psych-- Cognition and judgment appear intact. Cooperative with normal attention span and concentration. No anxious appearing , no depressed appearing.        Assessment & Plan:

## 2013-03-13 ENCOUNTER — Encounter: Payer: Self-pay | Admitting: Internal Medicine

## 2013-03-31 DIAGNOSIS — M722 Plantar fascial fibromatosis: Secondary | ICD-10-CM | POA: Diagnosis not present

## 2013-03-31 DIAGNOSIS — M2020 Hallux rigidus, unspecified foot: Secondary | ICD-10-CM | POA: Diagnosis not present

## 2013-04-08 ENCOUNTER — Other Ambulatory Visit: Payer: Self-pay | Admitting: Internal Medicine

## 2013-04-08 NOTE — Telephone Encounter (Signed)
Fluticasone refilled per protocol. JG//CMA

## 2013-04-13 DIAGNOSIS — M2020 Hallux rigidus, unspecified foot: Secondary | ICD-10-CM | POA: Diagnosis not present

## 2013-04-13 DIAGNOSIS — M722 Plantar fascial fibromatosis: Secondary | ICD-10-CM | POA: Diagnosis not present

## 2013-04-22 ENCOUNTER — Other Ambulatory Visit: Payer: Self-pay | Admitting: *Deleted

## 2013-04-22 MED ORDER — TERAZOSIN HCL 1 MG PO CAPS: 3.0000 mg | ORAL_CAPSULE | Freq: Every day | ORAL | Status: DC

## 2013-04-22 MED ORDER — HYDROCHLOROTHIAZIDE 25 MG PO TABS: 25.0000 mg | ORAL_TABLET | Freq: Every day | ORAL | Status: DC

## 2013-05-03 ENCOUNTER — Other Ambulatory Visit: Payer: Self-pay | Admitting: Internal Medicine

## 2013-05-04 ENCOUNTER — Telehealth: Payer: Self-pay

## 2013-05-04 NOTE — Telephone Encounter (Signed)
Spoke with pt he complained of a cough. No fever, aches, pains, stats that he had wanted a medication sent in. Pt was advised that we had to see him first. appt made for 2/18.

## 2013-05-04 NOTE — Telephone Encounter (Signed)
The patient called and is hoping the CMA can call back to discuss his medication.  He states he is hopeful of a refill of his bronchitis treatment meds due to his symptoms not getting better.   Thanks!    Callback [phone]

## 2013-05-06 ENCOUNTER — Ambulatory Visit (HOSPITAL_BASED_OUTPATIENT_CLINIC_OR_DEPARTMENT_OTHER)
Admission: RE | Admit: 2013-05-06 | Discharge: 2013-05-06 | Disposition: A | Discharge status: Home / Self Care | Payer: Medicare Other | Source: Ambulatory Visit | Attending: Internal Medicine | Admitting: Internal Medicine

## 2013-05-06 ENCOUNTER — Ambulatory Visit (INDEPENDENT_AMBULATORY_CARE_PROVIDER_SITE_OTHER): Payer: Medicare Other | Admitting: Internal Medicine

## 2013-05-06 ENCOUNTER — Encounter: Payer: Self-pay | Admitting: Internal Medicine

## 2013-05-06 VITALS — BP 134/78 | HR 67 | Temp 99.0°F | Wt 235.0 lb

## 2013-05-06 DIAGNOSIS — R05 Cough: Secondary | ICD-10-CM | POA: Diagnosis not present

## 2013-05-06 DIAGNOSIS — R059 Cough, unspecified: Secondary | ICD-10-CM | POA: Diagnosis not present

## 2013-05-06 DIAGNOSIS — J45909 Unspecified asthma, uncomplicated: Secondary | ICD-10-CM

## 2013-05-06 MED ORDER — BUDESONIDE-FORMOTEROL FUMARATE 160-4.5 MCG/ACT IN AERO: 2.0000 | INHALATION_SPRAY | Freq: Two times a day (BID) | RESPIRATORY_TRACT | Status: DC

## 2013-05-06 MED ORDER — HYDROCODONE-HOMATROPINE 5-1.5 MG/5ML PO SYRP: 5.0000 mL | ORAL_SOLUTION | Freq: Every evening | ORAL | Status: DC | PRN

## 2013-05-06 NOTE — Progress Notes (Signed)
Subjective:    Patient ID: Kirk Sutton, male    DOB: [birth year], 63 y.o.   MRN: [id number]  DOS:  05/06/2013 Acute visit, Continue with cough---->  symptoms started in November, at that time he got  doxycycline, he was seen again December, chest x-ray showed atelectasis, he was prescribed Avelox and Symbicort, while taking Symbicort for 30 days he felt better.  Although the cough never went away completely, symptoms increase a week ago: Persisting cough, having  a hard time sleeping   ROS No fevers, mild chills? No sputum production No chest pain or difficulty breathing No GERD symptoms Has not using Proair  Consistently. + nasal congestion x 1 week    Past Medical History  Diagnosis Date  . Asthma   . Hypertension   . Allergic rhinitis   . Hyperlipidemia   . Chronic prostatitis     sees urology  . Diabetes mellitus     type 11 (a1c 6.0 03/2008)  . Psoriasis     @ hands, sees derm    Past Surgical History  Procedure Laterality Date  . Hernia repair    . Appendectomy    . Fertility surgery  1979  . Testicle surgery      undescended repair   . Shoulder surgery  2008    left  . Nasal septum surgery  2009    History   Social History  . Marital Status: Married    Spouse Name: N/A    Number of Children: 2  . Years of Education: N/A   Occupational History  . Water treatment Materials engineer    Social History Main Topics  . Smoking status: Never Smoker   . Smokeless tobacco: Never Used  . Alcohol Use: No     Comment: socially  . Drug Use: No  . Sexual Activity: Not on file   Other Topics Concern  . Not on file   Social History Narrative  . No narrative on file        Medication List       This list is accurate as of: 05/06/13 11:59 PM.  Always use your most recent med list.               budesonide-formoterol 160-4.5 MCG/ACT inhaler  Commonly known as:  SYMBICORT  Inhale 2 puffs into the lungs 2 (two) times daily.     clobetasol  cream 0.05 %  Commonly known as:  TEMOVATE     ECOTRIN 325 MG EC tablet  Generic drug:  aspirin  Take 325 mg by mouth daily.     fluticasone 50 MCG/ACT nasal spray  Commonly known as:  FLONASE  INSTILL 2 SPRAYS INTO THE NOSE DAILY     hydrochlorothiazide 25 MG tablet  Commonly known as:  HYDRODIURIL  Take 1 tablet (25 mg total) by mouth daily.     HYDROcodone-homatropine 5-1.5 MG/5ML syrup  Commonly known as:  HYCODAN  Take 5 mLs by mouth at bedtime as needed for cough.     hydrocortisone 2.5 % ointment  Apply topically 2 (two) times daily.     loratadine 10 MG tablet  Commonly known as:  CLARITIN  Take 10 mg by mouth daily.     meloxicam 15 MG tablet  Commonly known as:  MOBIC  Take 15 mg by mouth daily.     metoprolol succinate 50 MG 24 hr tablet  Commonly known as:  TOPROL-XL  Take 1 tablet (50 mg total)  by mouth daily.     NON FORMULARY  Isogenics daily.     PROAIR HFA 108 (90 BASE) MCG/ACT inhaler  Generic drug:  albuterol  INHALE 2 PUFFS EVERY 6 HOURS AS NEEDED     PROTEGRA PO  Take by mouth.     terazosin 1 MG capsule  Commonly known as:  HYTRIN  Take 3 capsules (3 mg total) by mouth daily.     vitamin E 400 UNIT capsule  Generic drug:  vitamin E  Take 400 Units by mouth daily.           Objective:   Physical Exam BP 134/78  Pulse 67  Temp(Src) 99 F (37.2 C)  Wt 235 lb (106.595 kg)  SpO2 94% General -- alert, well-developed, NAD.   HEENT-- Not pale. TMs normal, throat symmetric, no redness or discharge. Face symmetric, sinuses not tender to palpation. Nose slt  congested. Lungs -- normal respiratory effort, no intercostal retractions, no accessory muscle use, + large airway congestion (mild), mild wheezing. Heart-- normal rate, regular rhythm, no murmur.   Neurologic--  alert & oriented X3. Speech normal, gait normal, strength normal in all extremities.  Psych-- Cognition and judgment appear intact. Cooperative with normal attention span  and concentration. No anxious or depressed appearing.      Assessment & Plan:   Today , I spent more than 25  min with the patient, >50% of the time counseling mostly about appropriate use of inhalers

## 2013-05-06 NOTE — Patient Instructions (Signed)
Get the XR at [facility], corner of [address] (10 minutes form here); they are open 24/7 [place], [place] [address] [phone]     For cough, take Mucinex DM twice a day as needed  If persisting cough at night take hydrocodone Symbicort twice a day every day Albuterol as needed for wheezing, chest congestion or persisting cough Call if no better in 2 weeks  Next visit in 2 months

## 2013-05-06 NOTE — Assessment & Plan Note (Addendum)
Ongoing symptoms since November, on and off, he did noted improvement while using Symbicort. With this in mind, the most likely cause of the cough is asthma. A recent chest x-ray showed atelectasis. Plan: Symbicort twice a day Refill hydrocodone  just to help him at night Nasal steroids Chest x-ray Patient ecudated about the use Symbicort and rescue inhalers. See instructions  If not improving, will need to see pulmonary, advise patient to call me in 2 weeks.

## 2013-05-07 ENCOUNTER — Encounter: Payer: Self-pay | Admitting: Internal Medicine

## 2013-05-21 ENCOUNTER — Telehealth: Payer: Self-pay | Admitting: *Deleted

## 2013-05-22 NOTE — Telephone Encounter (Signed)
Spoke with prior Event organiser and an Therapist, sports at [employer] at [phone]. The RN stated that we will receive a letter in the mail with approval or denial decision. JG//CMA

## 2013-05-27 NOTE — Telephone Encounter (Signed)
[employer] states that this particular medication requires a quantity limit exception. Additional paperwork faxed. Awaiting response. JG//CMA

## 2013-05-27 NOTE — Telephone Encounter (Signed)
Patient called to check on his medication refill for terazosin (HYTRIN) 1 MG capsule. Patient states that if [employer] will not call us back today if we can just send it to a local pharmacy.     Pharmacy [facility] [place]

## 2013-06-02 ENCOUNTER — Other Ambulatory Visit: Payer: Self-pay | Admitting: *Deleted

## 2013-06-02 MED ORDER — TERAZOSIN HCL 1 MG PO CAPS: 3.0000 mg | ORAL_CAPSULE | Freq: Every day | ORAL | Status: DC

## 2013-06-04 DIAGNOSIS — M2020 Hallux rigidus, unspecified foot: Secondary | ICD-10-CM | POA: Diagnosis not present

## 2013-06-04 DIAGNOSIS — M722 Plantar fascial fibromatosis: Secondary | ICD-10-CM | POA: Diagnosis not present

## 2013-06-25 ENCOUNTER — Other Ambulatory Visit: Payer: Self-pay

## 2013-06-29 ENCOUNTER — Other Ambulatory Visit: Payer: Self-pay | Admitting: Internal Medicine

## 2013-07-06 ENCOUNTER — Ambulatory Visit: Payer: Medicare Other | Admitting: Internal Medicine

## 2013-07-13 ENCOUNTER — Encounter: Payer: Self-pay | Admitting: Internal Medicine

## 2013-07-13 ENCOUNTER — Ambulatory Visit (INDEPENDENT_AMBULATORY_CARE_PROVIDER_SITE_OTHER): Payer: Medicare Other | Admitting: Internal Medicine

## 2013-07-13 VITALS — BP 128/77 | HR 60 | Temp 97.9°F | Wt 233.0 lb

## 2013-07-13 DIAGNOSIS — J45909 Unspecified asthma, uncomplicated: Secondary | ICD-10-CM

## 2013-07-13 DIAGNOSIS — I10 Essential (primary) hypertension: Secondary | ICD-10-CM | POA: Diagnosis not present

## 2013-07-13 DIAGNOSIS — F411 Generalized anxiety disorder: Secondary | ICD-10-CM | POA: Diagnosis not present

## 2013-07-13 MED ORDER — TERAZOSIN HCL 1 MG PO CAPS: ORAL_CAPSULE | ORAL | Status: DC

## 2013-07-13 NOTE — Patient Instructions (Signed)
You are doing great See you in June as schedule Use symbicort as needed

## 2013-07-13 NOTE — Assessment & Plan Note (Signed)
He eventually saw a counselor x 2, feeling better.

## 2013-07-13 NOTE — Assessment & Plan Note (Signed)
Doing well, BP  well-controlled, good compliance of medication, refill Hytrin for a year

## 2013-07-13 NOTE — Assessment & Plan Note (Signed)
Since he was here, he stopped taking Symbicort, essentially asymptomatic except for occasional cough he thinks related to allergies. Plan: Use inhalers when necessary, continue Claritin and nasal steroids

## 2013-07-13 NOTE — Progress Notes (Signed)
Subjective:    Patient ID: Kirk Sutton, male    DOB: Aug 21, 1950, 63 y.o.   MRN: [id number]  DOS:  07/13/2013 Type of  visit: ROV Since the last time he was here, he stopped Symbicort, cough resolved. Hypertension, good compliance with medications, check his BP regularly at the [facility] and is normal. Hyperglycemia, very active, eating healthy Anxiety, saw a counselor, feeling well   ROS Denies chest pain, difficulty breathing or lower extremity edema No nausea, vomiting, diarrhea  Past Medical History  Diagnosis Date  . Asthma   . Hypertension   . Allergic rhinitis   . Hyperlipidemia   . Chronic prostatitis     sees urology  . Diabetes mellitus     type 11 (a1c 6.0 03/2008)  . Psoriasis     @ hands, sees derm    Past Surgical History  Procedure Laterality Date  . Hernia repair    . Appendectomy    . Fertility surgery  1979  . Testicle surgery      undescended repair   . Shoulder surgery  2008    left  . Nasal septum surgery  2009    History   Social History  . Marital Status: Married    Spouse Name: N/A    Number of Children: 2  . Years of Education: N/A   Occupational History  . Water treatment Materials engineer    Social History Main Topics  . Smoking status: Never Smoker   . Smokeless tobacco: Never Used  . Alcohol Use: No     Comment: socially  . Drug Use: No  . Sexual Activity: Not on file   Other Topics Concern  . Not on file   Social History Narrative  . No narrative on file        Medication List       This list is accurate as of: 07/13/13  6:13 PM.  Always use your most recent med list.               budesonide-formoterol 160-4.5 MCG/ACT inhaler  Commonly known as:  SYMBICORT  Inhale 2 puffs into the lungs 2 (two) times daily as needed.     clobetasol cream 0.05 %  Commonly known as:  TEMOVATE     ECOTRIN 325 MG EC tablet  Generic drug:  aspirin  Take 325 mg by mouth daily.     fluticasone 50 MCG/ACT nasal spray    Commonly known as:  FLONASE  INSTILL 2 SPRAYS INTO THE NOSE DAILY     hydrochlorothiazide 25 MG tablet  Commonly known as:  HYDRODIURIL  Take 1 tablet (25 mg total) by mouth daily.     hydrocortisone 2.5 % ointment  Apply topically 2 (two) times daily.     loratadine 10 MG tablet  Commonly known as:  CLARITIN  Take 10 mg by mouth daily.     meloxicam 15 MG tablet  Commonly known as:  MOBIC  Take 15 mg by mouth daily.     metoprolol succinate 50 MG 24 hr tablet  Commonly known as:  TOPROL-XL  Take 1 tablet (50 mg total) by mouth daily.     NON FORMULARY  Isogenics daily.     PROAIR HFA 108 (90 BASE) MCG/ACT inhaler  Generic drug:  albuterol  INHALE 2 PUFFS EVERY 6 HOURS AS NEEDED     PROTEGRA PO  Take by mouth.     terazosin 1 MG capsule  Commonly known  as:  HYTRIN  TAKE 3 CAPSULES BY MOUTH EVERY DAY     vitamin E 400 UNIT capsule  Generic drug:  vitamin E  Take 400 Units by mouth daily.           Objective:   Physical Exam BP 128/77  Pulse 60  Temp(Src) 97.9 F (36.6 C)  Wt 233 lb (105.688 kg)  SpO2 93%  General -- alert, well-developed, NAD.   Lungs -- normal respiratory effort, no intercostal retractions, no accessory muscle use, and normal breath sounds.  Heart-- normal rate, regular rhythm, no murmur.  Extremities-- no pretibial edema bilaterally  Neurologic--  alert & oriented X3. Speech normal, gait normal, strength normal in all extremities.  Psych-- Cognition and judgment appear intact. Cooperative with normal attention span and concentration. No anxious or depressed appearing.       Assessment & Plan:

## 2013-07-13 NOTE — Progress Notes (Signed)
Pre visit review using our clinic review tool, if applicable. No additional management support is needed unless otherwise documented below in the visit note. 

## 2013-07-14 ENCOUNTER — Telehealth: Payer: Self-pay | Admitting: Internal Medicine

## 2013-07-14 NOTE — Telephone Encounter (Signed)
Relevant patient education assigned to patient using Emmi. ° °

## 2013-07-27 DIAGNOSIS — M722 Plantar fascial fibromatosis: Secondary | ICD-10-CM | POA: Diagnosis not present

## 2013-07-27 DIAGNOSIS — M2020 Hallux rigidus, unspecified foot: Secondary | ICD-10-CM | POA: Diagnosis not present

## 2013-08-14 DIAGNOSIS — M4310 Spondylolisthesis, site unspecified: Secondary | ICD-10-CM | POA: Diagnosis not present

## 2013-08-14 DIAGNOSIS — M25519 Pain in unspecified shoulder: Secondary | ICD-10-CM | POA: Diagnosis not present

## 2013-08-14 DIAGNOSIS — M5030 Other cervical disc degeneration, unspecified cervical region: Secondary | ICD-10-CM | POA: Diagnosis not present

## 2013-08-31 ENCOUNTER — Telehealth: Payer: Self-pay

## 2013-08-31 NOTE — Telephone Encounter (Signed)
Flu vaccine--01/2013 Tdap--2013 Shingles vaccine--2013 [facility]--Dr Santogade--neg  Sees urology yearly around October

## 2013-09-01 ENCOUNTER — Encounter: Payer: Self-pay | Admitting: Internal Medicine

## 2013-09-01 ENCOUNTER — Ambulatory Visit (INDEPENDENT_AMBULATORY_CARE_PROVIDER_SITE_OTHER): Payer: Medicare Other | Admitting: Internal Medicine

## 2013-09-01 VITALS — BP 131/80 | HR 60 | Temp 98.2°F | Ht 70.5 in | Wt 234.0 lb

## 2013-09-01 DIAGNOSIS — Z23 Encounter for immunization: Secondary | ICD-10-CM | POA: Diagnosis not present

## 2013-09-01 DIAGNOSIS — I10 Essential (primary) hypertension: Secondary | ICD-10-CM | POA: Diagnosis not present

## 2013-09-01 DIAGNOSIS — Z0000 Encounter for general adult medical examination without abnormal findings: Secondary | ICD-10-CM | POA: Diagnosis not present

## 2013-09-01 DIAGNOSIS — E119 Type 2 diabetes mellitus without complications: Secondary | ICD-10-CM

## 2013-09-01 DIAGNOSIS — J45909 Unspecified asthma, uncomplicated: Secondary | ICD-10-CM

## 2013-09-01 DIAGNOSIS — E785 Hyperlipidemia, unspecified: Secondary | ICD-10-CM | POA: Diagnosis not present

## 2013-09-01 LAB — LIPID PANEL
CHOLESTEROL: 159 mg/dL (ref 0–200)
HDL: 43.7 mg/dL (ref >39.00)
LDL Cholesterol: 95 mg/dL (ref 0–99)
NONHDL: 115.3
Total CHOL/HDL Ratio: 4
Triglycerides: 102 mg/dL (ref 0.0–149.0)
VLDL: 20.4 mg/dL (ref 0.0–40.0)

## 2013-09-01 LAB — COMPREHENSIVE METABOLIC PANEL
ALBUMIN: 4 g/dL (ref 3.5–5.2)
ALT: 18 U/L (ref 0–53)
AST: 25 U/L (ref 0–37)
Alkaline Phosphatase: 66 U/L (ref 39–117)
BUN: 20 mg/dL (ref 6–23)
CALCIUM: 9.1 mg/dL (ref 8.4–10.5)
CO2: 32 meq/L (ref 19–32)
Chloride: 100 mEq/L (ref 96–112)
Creatinine, Ser: 1.1 mg/dL (ref 0.4–1.5)
GFR: 68.43 mL/min (ref >60.00)
Glucose, Bld: 97 mg/dL (ref 70–99)
POTASSIUM: 3.7 meq/L (ref 3.5–5.1)
SODIUM: 137 meq/L (ref 135–145)
TOTAL PROTEIN: 6.7 g/dL (ref 6.0–8.3)
Total Bilirubin: 1 mg/dL (ref 0.2–1.2)

## 2013-09-01 LAB — HEMOGLOBIN A1C: HEMOGLOBIN A1C: 6 % (ref 4.6–6.5)

## 2013-09-01 NOTE — Assessment & Plan Note (Addendum)
Td 2013 PNM 23--today  Zostavax 2013 Discussed diet and exercise  Colonoscopy Dr Vladimir Faster 2009 (-), overdue for a Cscope plans to see Dr Ronnald Ramp ([place])  Cont healthy life style Sees urology yearly around October.

## 2013-09-01 NOTE — Progress Notes (Signed)
Subjective:    Patient ID: Kirk Sutton, male    DOB: 04/15/1950, 63 y.o.   MRN: [id number]  DOS:  09/01/2013 Type of  Visit:  Here for Medicare AWV:  1. Risk factors based on Past M, S, F history: reviewed 2. Physical Activities:  Plays golf, walks 2 miles 2-3/week 3. Depression/mood: No problemss noted or reported  4. Hearing:  No problemss noted or reported  5. ADL's:  Totally independent  6. Fall Risk: low, pt educated about prevention 7. home Safety: does feel safe at home  8. Height, weight, & visual acuity: see VS, uses contacts, sees MD q year 9. Counseling: provided 10. Labs ordered based on risk factors: if needed  11. Referral Coordination: if needed 12. Care Plan, see assessment and plan  13. Cognitive Assessment: motor and cognition normal  In addition, today we discussed the following: Hypertension, good medication compliance, ambulatory BPs 126/74 Diabetes, doing a great Job with exercise, was doing really well with diet but not so much in the last few months. Does not take ambulatory CBGs. Chronic prostatitis, doing well, see review of systems. Plans to see urology as usual History of asthma, currently asymptomatic, taking no routine medications. Very rarely uses albuterol  ROS Denies chest pain or difficulty breathing No nausea, vomiting, diarrhea or blood in the stools. (-) dysuria, hematuria or difficulty urinating  Past Medical History  Diagnosis Date  . Asthma   . Hypertension   . Allergic rhinitis   . Hyperlipidemia   . Chronic prostatitis     sees urology  . Diabetes mellitus     type 11 (a1c 6.0 03/2008)  . Psoriasis     @ hands, sees derm    Past Surgical History  Procedure Laterality Date  . Hernia repair    . Appendectomy    . Fertility surgery  1979  . Testicle surgery      undescended repair   . Shoulder surgery  2008    left  . Nasal septum surgery  2009    History   Social History  . Marital Status: Married    Spouse Name:  N/A    Number of Children: 2  . Years of Education: N/A   Occupational History  . Water treatment Materials engineer    Social History Main Topics  . Smoking status: Never Smoker   . Smokeless tobacco: Never Used  . Alcohol Use: Yes     Comment: socially  . Drug Use: No  . Sexual Activity: Not on file   Other Topics Concern  . Not on file   Social History Narrative  . No narrative on file     Family History  Problem Relation Age of Onset  . Diabetes Sister   . Hypertension Mother   . Hypertension Sister   . Heart attack Father     x 3 onset age 29, died at 22  . Colon polyps Mother   . Colon polyps Father   . Colon cancer Neg Hx   . Prostate cancer Neg Hx   . Cancer Father     bladder       Medication List       This list is accurate as of: 09/01/13 11:59 PM.  Always use your most recent med list.               budesonide-formoterol 160-4.5 MCG/ACT inhaler  Commonly known as:  SYMBICORT  Inhale 2 puffs into the lungs  2 (two) times daily as needed.     clobetasol cream 0.05 %  Commonly known as:  TEMOVATE     ECOTRIN 325 MG EC tablet  Generic drug:  aspirin  Take 325 mg by mouth daily.     fluticasone 50 MCG/ACT nasal spray  Commonly known as:  FLONASE  INSTILL 2 SPRAYS INTO THE NOSE DAILY     hydrochlorothiazide 25 MG tablet  Commonly known as:  HYDRODIURIL  Take 1 tablet (25 mg total) by mouth daily.     hydrocortisone 2.5 % ointment  Apply topically 2 (two) times daily.     loratadine 10 MG tablet  Commonly known as:  CLARITIN  Take 10 mg by mouth daily.     meloxicam 15 MG tablet  Commonly known as:  MOBIC  Take 15 mg by mouth daily.     metoprolol succinate 50 MG 24 hr tablet  Commonly known as:  TOPROL-XL  Take 1 tablet (50 mg total) by mouth daily.     NON FORMULARY  Isogenics daily.     PROAIR HFA 108 (90 BASE) MCG/ACT inhaler  Generic drug:  albuterol  INHALE 2 PUFFS EVERY 6 HOURS AS NEEDED     PROTEGRA PO  Take  by mouth.     terazosin 1 MG capsule  Commonly known as:  HYTRIN  TAKE 3 CAPSULES BY MOUTH EVERY DAY     vitamin E 400 UNIT capsule  Generic drug:  vitamin E  Take 400 Units by mouth daily.           Objective:   Physical Exam BP 131/80  Pulse 60  Temp(Src) 98.2 F (36.8 C)  Ht 5' 10.5" (1.791 m)  Wt 234 lb (106.142 kg)  BMI 33.09 kg/m2  SpO2 96% General -- alert, well-developed, NAD.  Neck --no thyromegaly , normal carotid pulse  HEENT-- Not pale.  Lungs -- normal respiratory effort, no intercostal retractions, no accessory muscle use, and normal breath sounds.  Heart-- normal rate, regular rhythm, no murmur.  Abdomen-- Not distended, good bowel sounds,soft, non-tender. + reducible, 2 cm umbilical hernia  DIABETIC FEET EXAM: No lower extremity edema Normal pedal pulses bilaterally Skin  nails normal, no calluses Pinprick examination of the feet normal. Neurologic--  alert & oriented X3. Speech normal, gait appropriate for age, strength symmetric and appropriate for age.  Psych-- Cognition and judgment appear intact. Cooperative with normal attention span and concentration. No anxious or depressed appearing.      Assessment & Plan:

## 2013-09-01 NOTE — Patient Instructions (Signed)
Get your blood work before you leave     Next visit is for routine check up regards your blood sugar , blood pressure  in 6 months  No need to come back fasting Please make an appointment    At some point this year the clinic will relocate to  [place] and [address] (10 minutes form here)  [place], [place] [address] [phone]   Fall Prevention and [place] cause injuries and can affect all age groups. It is possible to use preventive measures to significantly decrease the likelihood of falls. There are many simple measures which can make your home safer and prevent falls. OUTDOORS  Repair cracks and edges of walkways and driveways.  Remove high doorway thresholds.  Trim shrubbery on the main path into your home.  Have good outside lighting.  Clear walkways of tools, rocks, debris, and clutter.  Check that handrails are not broken and are securely fastened. Both sides of steps should have handrails.  Have leaves, snow, and ice cleared regularly.  Use sand or salt on walkways during winter months.  In the garage, clean up grease or oil spills. BATHROOM  Install night lights.  Install grab bars by the toilet and in the tub and shower.  Use non-skid mats or decals in the tub or shower.  Place a plastic non-slip stool in the shower to sit on, if needed.  Keep floors dry and clean up all water on the floor immediately.  Remove soap buildup in the tub or shower on a regular basis.  Secure bath mats with non-slip, double-sided rug tape.  Remove throw rugs and tripping hazards from the floors. BEDROOMS  Install night lights.  Make sure a bedside light is easy to reach.  Do not use oversized bedding.  Keep a telephone by your bedside.  Have a firm chair with side arms to use for getting dressed.  Remove throw rugs and tripping hazards from the floor. KITCHEN  Keep handles on pots and pans  turned toward the center of the stove. Use back burners when possible.  Clean up spills quickly and allow time for drying.  Avoid walking on wet floors.  Avoid hot utensils and knives.  Position shelves so they are not too high or low.  Place commonly used objects within easy reach.  If necessary, use a sturdy step stool with a grab bar when reaching.  Keep electrical cables out of the way.  Do not use floor polish or wax that makes floors slippery. If you must use wax, use non-skid floor wax.  Remove throw rugs and tripping hazards from the floor. STAIRWAYS  Never leave objects on stairs.  Place handrails on both sides of stairways and use them. Fix any loose handrails. Make sure handrails on both sides of the stairways are as long as the stairs.  Check carpeting to make sure it is firmly attached along stairs. Make repairs to worn or loose carpet promptly.  Avoid placing throw rugs at the top or bottom of stairways, or properly secure the rug with carpet tape to prevent slippage. Get rid of throw rugs, if possible.  Have an electrician put in a light switch at the top and bottom of the stairs. OTHER FALL PREVENTION TIPS  Wear low-heel or rubber-soled shoes that are supportive and fit well. Wear closed toe shoes.  When using a stepladder, make sure it is fully  opened and both spreaders are firmly locked. Do not climb a closed stepladder.  Add color or contrast paint or tape to grab bars and handrails in your home. Place contrasting color strips on first and last steps.  Learn and use mobility aids as needed. Install an electrical emergency response system.  Turn on lights to avoid dark areas. Replace light bulbs that burn out immediately. Get light switches that glow.  Arrange furniture to create clear pathways. Keep furniture in the same place.  Firmly attach carpet with non-skid or double-sided tape.  Eliminate uneven floor surfaces.  Select a carpet pattern that  does not visually hide the edge of steps.  Be aware of all pets. OTHER HOME SAFETY TIPS  Set the water temperature for 120 F (48.8 C).  Keep emergency numbers on or near the telephone.  Keep smoke detectors on every level of the home and near sleeping areas. Document Released: 02/23/2002 Document Revised: 09/04/2011 Document Reviewed: 05/25/2011 [facility] Patient Information 2014 [place].

## 2013-09-01 NOTE — Assessment & Plan Note (Signed)
Doing great, not using Symbicort, rarely uses albuterol. Recommend to watch his symptoms, may like to restart Symbicort during the fall

## 2013-09-01 NOTE — Progress Notes (Signed)
Pre visit review using our clinic review tool, if applicable. No additional management support is needed unless otherwise documented below in the visit note. 

## 2013-09-01 NOTE — Assessment & Plan Note (Signed)
Well-controlled, no change, check a CMP

## 2013-09-01 NOTE — Assessment & Plan Note (Addendum)
Feet exam negative. Doing great with exercise, counseled about diet

## 2013-09-02 ENCOUNTER — Telehealth: Payer: Self-pay | Admitting: Internal Medicine

## 2013-09-02 NOTE — Telephone Encounter (Signed)
Relevant patient education assigned to patient using Emmi. ° °

## 2013-09-04 DIAGNOSIS — M5030 Other cervical disc degeneration, unspecified cervical region: Secondary | ICD-10-CM | POA: Diagnosis not present

## 2013-09-04 DIAGNOSIS — M48061 Spinal stenosis, lumbar region without neurogenic claudication: Secondary | ICD-10-CM | POA: Diagnosis not present

## 2013-09-09 DIAGNOSIS — H10219 Acute toxic conjunctivitis, unspecified eye: Secondary | ICD-10-CM | POA: Diagnosis not present

## 2013-09-10 DIAGNOSIS — H10219 Acute toxic conjunctivitis, unspecified eye: Secondary | ICD-10-CM | POA: Diagnosis not present

## 2013-09-17 DIAGNOSIS — H10219 Acute toxic conjunctivitis, unspecified eye: Secondary | ICD-10-CM | POA: Diagnosis not present

## 2013-10-22 ENCOUNTER — Other Ambulatory Visit: Payer: Self-pay

## 2013-10-22 DIAGNOSIS — I10 Essential (primary) hypertension: Secondary | ICD-10-CM

## 2013-10-22 MED ORDER — HYDROCHLOROTHIAZIDE 25 MG PO TABS: 25.0000 mg | ORAL_TABLET | Freq: Every day | ORAL | Status: DC

## 2013-10-27 ENCOUNTER — Other Ambulatory Visit: Payer: Self-pay | Admitting: *Deleted

## 2013-10-27 MED ORDER — METOPROLOL SUCCINATE ER 50 MG PO TB24: 50.0000 mg | ORAL_TABLET | Freq: Every day | ORAL | Status: DC

## 2013-10-30 ENCOUNTER — Other Ambulatory Visit: Payer: Self-pay

## 2013-10-30 DIAGNOSIS — I10 Essential (primary) hypertension: Secondary | ICD-10-CM

## 2013-10-30 MED ORDER — HYDROCHLOROTHIAZIDE 25 MG PO TABS: 25.0000 mg | ORAL_TABLET | Freq: Every day | ORAL | Status: DC

## 2013-11-19 DIAGNOSIS — M722 Plantar fascial fibromatosis: Secondary | ICD-10-CM | POA: Diagnosis not present

## 2013-11-19 DIAGNOSIS — M2020 Hallux rigidus, unspecified foot: Secondary | ICD-10-CM | POA: Diagnosis not present

## 2013-12-01 DIAGNOSIS — D235 Other benign neoplasm of skin of trunk: Secondary | ICD-10-CM | POA: Diagnosis not present

## 2013-12-01 DIAGNOSIS — L259 Unspecified contact dermatitis, unspecified cause: Secondary | ICD-10-CM | POA: Diagnosis not present

## 2013-12-17 DIAGNOSIS — H2513 Age-related nuclear cataract, bilateral: Secondary | ICD-10-CM | POA: Diagnosis not present

## 2014-01-01 ENCOUNTER — Other Ambulatory Visit: Payer: Self-pay

## 2014-01-08 ENCOUNTER — Ambulatory Visit (INDEPENDENT_AMBULATORY_CARE_PROVIDER_SITE_OTHER): Payer: Medicare Other | Admitting: Medical

## 2014-01-08 ENCOUNTER — Encounter: Payer: Self-pay | Admitting: Medical

## 2014-01-08 VITALS — BP 153/81 | HR 53 | Temp 98.5°F | Ht 70.0 in | Wt 234.2 lb

## 2014-01-08 DIAGNOSIS — J069 Acute upper respiratory infection, unspecified: Secondary | ICD-10-CM | POA: Diagnosis not present

## 2014-01-08 MED ORDER — BECLOMETHASONE DIPROPIONATE 40 MCG/ACT IN AERS: 2.0000 | INHALATION_SPRAY | Freq: Two times a day (BID) | RESPIRATORY_TRACT | Status: DC

## 2014-01-08 MED ORDER — FLUTICASONE PROPIONATE 50 MCG/ACT NA SUSP: 2.0000 | Freq: Every day | NASAL | Status: DC

## 2014-01-08 MED ORDER — BENZONATATE 100 MG PO CAPS: 100.0000 mg | ORAL_CAPSULE | Freq: Three times a day (TID) | ORAL | Status: DC | PRN

## 2014-01-08 MED ORDER — AZITHROMYCIN 250 MG PO TABS: ORAL_TABLET | ORAL | Status: DC

## 2014-01-08 NOTE — Progress Notes (Signed)
Subjective:    Patient ID: Kirk Sutton, male    DOB: 1950/03/21, 63 y.o.   MRN: [id number]  HPI   Pt in with chest congestion and some sinus pressure. He got some scratchy upper palate and mild sinus pressure then got chest congestion. Mild fatigue. Pt tried mucinex dm and using proair. Very faint wheeze. No other inhaler used. Pt does note smoke. He does have some cough. Symptoms for about 3 days.  Past Medical History  Diagnosis Date  . Asthma   . Hypertension   . Allergic rhinitis   . Hyperlipidemia   . Chronic prostatitis     sees urology  . Diabetes mellitus     type 11 (a1c 6.0 03/2008)  . Psoriasis     @ hands, sees derm    History   Social History  . Marital Status: Married    Spouse Name: N/A    Number of Children: 2  . Years of Education: N/A   Occupational History  . Water treatment Materials engineer    Social History Main Topics  . Smoking status: Never Smoker   . Smokeless tobacco: Never Used  . Alcohol Use: Yes     Comment: socially  . Drug Use: No  . Sexual Activity: Not on file   Other Topics Concern  . Not on file   Social History Narrative  . No narrative on file    Past Surgical History  Procedure Laterality Date  . Hernia repair    . Appendectomy    . Fertility surgery  1979  . Testicle surgery      undescended repair   . Shoulder surgery  2008    left  . Nasal septum surgery  2009    Family History  Problem Relation Age of Onset  . Diabetes Sister   . Hypertension Mother   . Hypertension Sister   . Heart attack Father     x 3 onset age 69, died at 76  . Colon polyps Mother   . Colon polyps Father   . Colon cancer Neg Hx   . Prostate cancer Neg Hx   . Cancer Father     bladder    Allergies  Allergen Reactions  . Cetirizine Hcl     REACTION: prostatitis  . Fexofenadine     REACTION: causes prostatitis    Current Outpatient Prescriptions on File Prior to Visit  Medication Sig Dispense Refill  . aspirin  (ECOTRIN) 325 MG EC tablet Take 325 mg by mouth daily.      . budesonide-formoterol (SYMBICORT) 160-4.5 MCG/ACT inhaler Inhale 2 puffs into the lungs 2 (two) times daily as needed.      . clobetasol cream (TEMOVATE) 0.05 %       . hydrochlorothiazide (HYDRODIURIL) 25 MG tablet Take 1 tablet (25 mg total) by mouth daily.  90 tablet  1  . hydrocortisone 2.5 % ointment Apply topically 2 (two) times daily.        [name] Kitchen loratadine (CLARITIN) 10 MG tablet Take 10 mg by mouth daily.        . meloxicam (MOBIC) 15 MG tablet Take 15 mg by mouth daily.      . metoprolol succinate (TOPROL-XL) 50 MG 24 hr tablet Take 1 tablet (50 mg total) by mouth daily.  90 tablet  2  . Multiple Vitamins-Minerals (PROTEGRA PO) Take by mouth.        . NON FORMULARY Isogenics daily.      [name] Kitchen  PROAIR HFA 108 (90 BASE) MCG/ACT inhaler INHALE 2 PUFFS EVERY 6 HOURS AS NEEDED  8.5 g  0  . terazosin (HYTRIN) 1 MG capsule TAKE 3 CAPSULES BY MOUTH EVERY DAY  270 capsule  3  . vitamin E (VITAMIN E) 400 UNIT capsule Take 400 Units by mouth daily.         No current facility-administered medications on file prior to visit.    BP 153/81  Pulse 53  Temp(Src) 98.5 F (36.9 C) (Oral)  Ht 5\' 10"  (1.778 m)  Wt 234 lb 3.2 oz (106.232 kg)  BMI 33.60 kg/m2  SpO2 98%     Review of Systems  Constitutional: Positive for fatigue. Negative for fever and chills.  HENT: Positive for congestion, sinus pressure and sneezing. Negative for ear pain, hearing loss, nosebleeds, postnasal drip, rhinorrhea, sore throat and tinnitus.   Respiratory: Positive for cough and wheezing. Negative for choking and shortness of breath.   Gastrointestinal: Negative.   Genitourinary: Negative.   Neurological: Negative.   Hematological: Negative for adenopathy. Does not bruise/bleed easily.  Psychiatric/Behavioral:       Some recent stress regarding his job.       Objective:   Physical Exam  General  Mental Status - Alert. General Appearance - Well  groomed. Not in acute distress.  Skin Rashes- No Rashes.  HEENT Head- Normal. Ear Auditory Canal - Left- Normal. Right - Normal.Tympanic Membrane- Left- minimal dull appearance. Right- Normal. Eye Sclera/Conjunctiva- Left- Normal. Right- Normal. Nose & Sinuses Nasal Mucosa- Left-  Boggy + Congested. Right-  Boggy + Congested. No sinus pressure. Mouth & Throat Lips: Upper Lip- Normal: no dryness, cracking, pallor, cyanosis, or vesicular eruption. Lower Lip-Normal: no dryness, cracking, pallor, cyanosis or vesicular eruption. Buccal Mucosa- Bilateral- No Aphthous ulcers. Oropharynx- No Discharge or Erythema. Tonsils: Characteristics- Bilateral- No Erythema or Congestion. Size/Enlargement- Bilateral- No enlargement. Discharge- bilateral-None.  Neck Neck- Supple. No Masses.   Chest and Lung Exam Auscultation: Breath Sounds:-Normal.CTA  Cardiovascular Auscultation:Rythm- Regular, rate and rythm Murmurs & Other Heart Sounds:Ausculatation of the heart reveal- No Murmurs.  Lymphatic Head & Neck General Head & Neck Lymphatics: Bilateral: Description- No Localized lymphadenopathy.         Assessment & Plan:

## 2014-01-08 NOTE — Assessment & Plan Note (Signed)
Mild flare recently and I did prescribe him Qvar inhaler to be used with his albuterol inhaler if he is having to use albuterol on a repetitive basis.

## 2014-01-08 NOTE — Assessment & Plan Note (Signed)
Versus possible allergic rhinitis with some mild wheezing and exacerbation of his asthma. I gave him a prescription of benzonatate, Flonase, and Qvar. Continue to use proair inhaler. If he worsens as I discussed over the weekend then he can start azithromycin which I did make available and sent to his pharmacy. See AVS for instructions as well.

## 2014-01-08 NOTE — Progress Notes (Signed)
Pre visit review using our clinic review tool, if applicable. No additional management support is needed unless otherwise documented below in the visit note. 

## 2014-01-08 NOTE — Patient Instructions (Addendum)
Your appear to have  uri vs allergic rhinitis symptoms(with some wheezing). I will rx benzonatate for cough, fluticasone nasal spray, and qvar inhaler.(if you have to use proair every 6 hrs). I am sending azithromycin if your symptoms worsen as described.

## 2014-01-11 ENCOUNTER — Telehealth: Payer: Self-pay | Admitting: *Deleted

## 2014-01-11 NOTE — Telephone Encounter (Signed)
Prior authorization for Qvar initiated. Awaiting determination. JG//CMA

## 2014-01-12 ENCOUNTER — Telehealth: Payer: Self-pay | Admitting: Internal Medicine

## 2014-01-12 NOTE — Telephone Encounter (Signed)
[employer] states that QVAR 2mcg is covered at an estimated cost of $30 for patient for one inhaler. I let [facility] know. JG//CMA

## 2014-01-12 NOTE — Telephone Encounter (Signed)
Caller name: Doren Custard  Call back number:[phone]   Reason for call:  Pt was seen and prescribed 2 inhalers.  Pt is doing much better and the QVAR was just approved by insurance and he wants to know should he still take it or should he hold on to it until he gets sick again.  Advise.

## 2014-01-13 NOTE — Telephone Encounter (Signed)
Please advise 

## 2014-01-13 NOTE — Telephone Encounter (Signed)
Caller name: Cougar, Imel Relation to pt: self  Call back number: [phone]   Reason for call:   pt is doing better in need of clinical advice regarding continue taking beclomethasone (QVAR) 40 MCG/ACT and inhaler  PROAIR HFA 108 (90 BASE) MCG/ACT inhaler

## 2014-01-13 NOTE — Telephone Encounter (Signed)
Left message on answering kmachine for call back

## 2014-01-13 NOTE — Telephone Encounter (Signed)
LMOM for Pt to return call.  

## 2014-01-13 NOTE — Telephone Encounter (Signed)
Advice patient: OK to HOLD d Qvar for now, do not dispose the medicine. If he notice the  Need for albuterol increases,  then restart Qvar daily to prevent the need to use albuterol frequently. If he gets sick  (cold, bronchitis) -- needs to  be seen

## 2014-01-18 NOTE — Telephone Encounter (Signed)
Follow up call to patient . States he did get medication but he does not need it now because he his well. Thanked me for calling.

## 2014-01-26 DIAGNOSIS — N400 Enlarged prostate without lower urinary tract symptoms: Secondary | ICD-10-CM | POA: Diagnosis not present

## 2014-02-02 ENCOUNTER — Encounter: Payer: Self-pay | Admitting: Internal Medicine

## 2014-02-02 ENCOUNTER — Ambulatory Visit (INDEPENDENT_AMBULATORY_CARE_PROVIDER_SITE_OTHER): Payer: Medicare Other | Admitting: Internal Medicine

## 2014-02-02 VITALS — BP 143/76 | HR 60 | Temp 98.1°F | Wt 234.1 lb

## 2014-02-02 DIAGNOSIS — J4521 Mild intermittent asthma with (acute) exacerbation: Secondary | ICD-10-CM

## 2014-02-02 DIAGNOSIS — N400 Enlarged prostate without lower urinary tract symptoms: Secondary | ICD-10-CM | POA: Diagnosis not present

## 2014-02-02 MED ORDER — DOXYCYCLINE HYCLATE 100 MG PO TABS: 100.0000 mg | ORAL_TABLET | Freq: Two times a day (BID) | ORAL | Status: DC

## 2014-02-02 NOTE — Progress Notes (Signed)
Subjective:    Patient ID: Kirk Sutton, male    DOB: 03-Aug-1950, 63 y.o.   MRN: [id number]  DOS:  02/02/2014 Type of visit - description : acute Interval history: Was recently seen with URI, prescribed Tessalon Perles, Qvar, Mucinex and Zithromax. Improved very little. Currently not taking Qvar, and using Proair sporadically ; no more than 1 or 2 times a day. The cough is still there along with bronchial congestion    ROS No fever chills No sinus pain or congestion No chest pain or difficulty breathing Occasional production of very small amount of sputum  Past Medical History  Diagnosis Date  . Asthma   . Hypertension   . Allergic rhinitis   . Hyperlipidemia   . Chronic prostatitis     sees urology  . Diabetes mellitus     type 11 (a1c 6.0 03/2008)  . Psoriasis     @ hands, sees derm    Past Surgical History  Procedure Laterality Date  . Hernia repair    . Appendectomy    . Fertility surgery  1979  . Testicle surgery      undescended repair   . Shoulder surgery  2008    left  . Nasal septum surgery  2009    History   Social History  . Marital Status: Married    Spouse Name: N/A    Number of Children: 2  . Years of Education: N/A   Occupational History  . Water treatment Materials engineer    Social History Main Topics  . Smoking status: Never Smoker   . Smokeless tobacco: Never Used  . Alcohol Use: Yes     Comment: socially  . Drug Use: No  . Sexual Activity: Not on file   Other Topics Concern  . Not on file   Social History Narrative        Medication List       This list is accurate as of: 02/02/14  5:04 PM.  Always use your most recent med list.               beclomethasone 40 MCG/ACT inhaler  Commonly known as:  QVAR  Inhale 2 puffs into the lungs 2 (two) times daily at 10 AM and 5 PM.     benzonatate 100 MG capsule  Commonly known as:  TESSALON  Take 1 capsule (100 mg total) by mouth 3 (three) times daily as needed.      clobetasol cream 0.05 %  Commonly known as:  TEMOVATE     doxycycline 100 MG tablet  Commonly known as:  VIBRA-TABS  Take 1 tablet (100 mg total) by mouth 2 (two) times daily.     ECOTRIN 325 MG EC tablet  Generic drug:  aspirin  Take 325 mg by mouth daily.     fluticasone 50 MCG/ACT nasal spray  Commonly known as:  FLONASE  Place 2 sprays into both nostrils daily.     hydrochlorothiazide 25 MG tablet  Commonly known as:  HYDRODIURIL  Take 1 tablet (25 mg total) by mouth daily.     hydrocortisone 2.5 % ointment  Apply topically 2 (two) times daily.     loratadine 10 MG tablet  Commonly known as:  CLARITIN  Take 10 mg by mouth daily.     meloxicam 15 MG tablet  Commonly known as:  MOBIC  Take 15 mg by mouth daily.     metoprolol succinate 50 MG 24 hr tablet  Commonly known as:  TOPROL-XL  Take 1 tablet (50 mg total) by mouth daily.     NON FORMULARY  Isogenics daily.     PROAIR HFA 108 (90 BASE) MCG/ACT inhaler  Generic drug:  albuterol  INHALE 2 PUFFS EVERY 6 HOURS AS NEEDED     PROTEGRA PO  Take by mouth.     terazosin 1 MG capsule  Commonly known as:  HYTRIN  TAKE 3 CAPSULES BY MOUTH EVERY DAY     traMADol 50 MG tablet  Commonly known as:  ULTRAM  Take 50-100 mg by mouth every 4 (four) hours as needed.     vitamin E 400 UNIT capsule  Generic drug:  vitamin E  Take 400 Units by mouth daily.           Objective:   Physical Exam BP 143/76 mmHg  Pulse 60  Temp(Src) 98.1 F (36.7 C) (Oral)  Wt 234 lb 2 oz (106.198 kg)  SpO2 93%  General -- alert, well-developed, NAD.   HEENT-- Not pale.  R Ear-- normal L ear-- normal Throat symmetric, no redness or discharge. Face symmetric, sinuses not tender to palpation. Nose not congested.  Lungs -- normal respiratory effort, no intercostal retractions, no accessory muscle use, few rhonchi with cough Heart-- normal rate, regular rhythm, no murmur.  Extremities-- no pretibial edema bilaterally    Neurologic--  alert & oriented X3. Speech normal, gait appropriate for age, strength symmetric and appropriate for age.  Psych-- Cognition and judgment appear intact. Cooperative with normal attention span and concentration. No anxious or depressed appearing.      Assessment & Plan:

## 2014-02-02 NOTE — Patient Instructions (Addendum)
Rest, fluids , tylenol If  cough, take Mucinex DM twice a day as needed  Qvar 2 puffs twice a day every day Albuterol only as a rescue inhaler (if wheezing, severe cough)  Take the antibiotic as prescribed  (doxycycline) in 3-4 days if no better  Call if not gradually better over the next  10 days Call anytime if the symptoms are severe

## 2014-02-02 NOTE — Assessment & Plan Note (Signed)
Asthma Recently seen with URI, status post Zithromax, currently with symptoms consistent with mild bronchitis. Plan: Qvar twice a day, albuterol as needed, Mucinex Discontinue Symbicort, never really used it If not better start doxycycline.

## 2014-02-02 NOTE — Progress Notes (Signed)
Pre visit review using our clinic review tool, if applicable. No additional management support is needed unless otherwise documented below in the visit note. 

## 2014-02-08 ENCOUNTER — Other Ambulatory Visit: Payer: Self-pay

## 2014-02-18 DIAGNOSIS — L2084 Intrinsic (allergic) eczema: Secondary | ICD-10-CM | POA: Diagnosis not present

## 2014-03-08 ENCOUNTER — Ambulatory Visit (INDEPENDENT_AMBULATORY_CARE_PROVIDER_SITE_OTHER): Payer: Medicare Other

## 2014-03-08 ENCOUNTER — Encounter: Payer: Self-pay | Admitting: Internal Medicine

## 2014-03-08 ENCOUNTER — Ambulatory Visit (INDEPENDENT_AMBULATORY_CARE_PROVIDER_SITE_OTHER): Payer: Medicare Other | Admitting: Internal Medicine

## 2014-03-08 VITALS — BP 154/86 | HR 75 | Temp 98.1°F | Wt 235.2 lb

## 2014-03-08 DIAGNOSIS — R52 Pain, unspecified: Secondary | ICD-10-CM | POA: Diagnosis not present

## 2014-03-08 DIAGNOSIS — Z23 Encounter for immunization: Secondary | ICD-10-CM

## 2014-03-08 DIAGNOSIS — J4521 Mild intermittent asthma with (acute) exacerbation: Secondary | ICD-10-CM

## 2014-03-08 DIAGNOSIS — I10 Essential (primary) hypertension: Secondary | ICD-10-CM | POA: Diagnosis not present

## 2014-03-08 LAB — BASIC METABOLIC PANEL
BUN: 22 mg/dL (ref 6–23)
CALCIUM: 8.7 mg/dL (ref 8.4–10.5)
CO2: 28 mEq/L (ref 19–32)
Chloride: 100 mEq/L (ref 96–112)
Creatinine, Ser: 1.1 mg/dL (ref 0.4–1.5)
GFR: 69.73 mL/min (ref >60.00)
GLUCOSE: 104 mg/dL — AB (ref 70–99)
Potassium: 3.8 mEq/L (ref 3.5–5.1)
SODIUM: 136 meq/L (ref 135–145)

## 2014-03-08 MED ORDER — METOPROLOL SUCCINATE ER 50 MG PO TB24: 50.0000 mg | ORAL_TABLET | Freq: Every day | ORAL | Status: DC

## 2014-03-08 MED ORDER — BECLOMETHASONE DIPROPIONATE 40 MCG/ACT IN AERS: 2.0000 | INHALATION_SPRAY | Freq: Two times a day (BID) | RESPIRATORY_TRACT | Status: DC

## 2014-03-08 NOTE — Assessment & Plan Note (Signed)
Recovered completely from recent exacerbation, did not use doxycycline. Before the exacerbation, he was nearly asymptomatic. Plan: Use albuterol as rescue inhaler, stop Qvar and restart it at times of the year that he expects symptoms as a preventive measure.  Seems to understand the concept of rescue vs prevention inhalers.

## 2014-03-08 NOTE — Patient Instructions (Signed)
Get your blood work before you leave   Check the  blood pressure   weekly  Be sure your blood pressure is between  145/85  and 110/65.  if it is consistently higher or lower, let me know  Use albuterol if you have cough wheezing Use Qvar as a preventive medication twice a day for the times of the year you expect  asthma to act up.   Please come back to the office by 08-2014 for a physical exam. Come back fasting

## 2014-03-08 NOTE — Progress Notes (Signed)
Pre visit review using our clinic review tool, if applicable. No additional management support is needed unless otherwise documented below in the visit note. 

## 2014-03-08 NOTE — Progress Notes (Signed)
Subjective:    Patient ID: Kirk Sutton, male    DOB: March 02, 1951, 63 y.o.   MRN: [id number]  DOS:  03/08/2014 Type of visit - description : ROV Interval history: Hypertension, good medication compliance, ambulatory BPs within normal Asthma, using Qvar consistently, asymptomatic. Request a flu shot    ROS Denies chest pain or difficulty breathing No nausea, vomiting, diarrhea  Past Medical History  Diagnosis Date  . Asthma   . Hypertension   . Allergic rhinitis   . Hyperlipidemia   . Chronic prostatitis     sees urology  . Diabetes mellitus     type 11 (a1c 6.0 03/2008)  . Psoriasis     @ hands, sees derm    Past Surgical History  Procedure Laterality Date  . Hernia repair    . Appendectomy    . Fertility surgery  1979  . Testicle surgery      undescended repair   . Shoulder surgery  2008    left  . Nasal septum surgery  2009    History   Social History  . Marital Status: Married    Spouse Name: N/A    Number of Children: 2  . Years of Education: N/A   Occupational History  . Water treatment Materials engineer    Social History Main Topics  . Smoking status: Never Smoker   . Smokeless tobacco: Never Used  . Alcohol Use: Yes     Comment: socially  . Drug Use: No  . Sexual Activity: Not on file   Other Topics Concern  . Not on file   Social History Narrative        Medication List       This list is accurate as of: 03/08/14  5:50 PM.  Always use your most recent med list.               beclomethasone 40 MCG/ACT inhaler  Commonly known as:  QVAR  Inhale 2 puffs into the lungs 2 (two) times daily at 10 AM and 5 PM.     ECOTRIN 325 MG EC tablet  Generic drug:  aspirin  Take 325 mg by mouth daily.     fluticasone 50 MCG/ACT nasal spray  Commonly known as:  FLONASE  Place 2 sprays into both nostrils daily.     hydrochlorothiazide 25 MG tablet  Commonly known as:  HYDRODIURIL  Take 1 tablet (25 mg total) by mouth daily.     hydrocortisone 2.5 % ointment  Apply topically 2 (two) times daily.     loratadine 10 MG tablet  Commonly known as:  CLARITIN  Take 10 mg by mouth daily.     meloxicam 15 MG tablet  Commonly known as:  MOBIC  Take 15 mg by mouth daily.     metoprolol succinate 50 MG 24 hr tablet  Commonly known as:  TOPROL-XL  Take 1 tablet (50 mg total) by mouth daily.     NON FORMULARY  Isogenics daily.     PROAIR HFA 108 (90 BASE) MCG/ACT inhaler  Generic drug:  albuterol  INHALE 2 PUFFS EVERY 6 HOURS AS NEEDED     PROTEGRA PO  Take by mouth.     terazosin 1 MG capsule  Commonly known as:  HYTRIN  TAKE 3 CAPSULES BY MOUTH EVERY DAY     traMADol 50 MG tablet  Commonly known as:  ULTRAM  Take 50-100 mg by mouth every 4 (four) hours as needed.  vitamin E 400 UNIT capsule  Generic drug:  vitamin E  Take 400 Units by mouth daily.           Objective:   Physical Exam BP 154/86 mmHg  Pulse 75  Temp(Src) 98.1 F (36.7 C) (Oral)  Wt 235 lb 4 oz (106.709 kg)  SpO2 92% General -- alert, well-developed, NAD.   Lungs -- normal respiratory effort, no intercostal retractions, no accessory muscle use, and normal breath sounds.  Heart-- normal rate, regular rhythm, no murmur.   Extremities-- no pretibial edema bilaterally  Neurologic--  alert & oriented X3. Speech normal, gait appropriate for age, strength symmetric and appropriate for age.  Psych-- Cognition and judgment appear intact. Cooperative with normal attention span and concentration. No anxious or depressed appearing.        Assessment & Plan:

## 2014-03-08 NOTE — Assessment & Plan Note (Signed)
Tramadol as needed for back pain Meloxicam as needed for toe-DJD pain the foot

## 2014-03-08 NOTE — Assessment & Plan Note (Signed)
Continue with metoprolol, hydrochlorothiazide. Check a BMP and ambulatory BPs. (BP today slightly elevated, at home is reportedly okay)

## 2014-03-16 ENCOUNTER — Ambulatory Visit (INDEPENDENT_AMBULATORY_CARE_PROVIDER_SITE_OTHER): Payer: Medicare Other | Admitting: Medical

## 2014-03-16 ENCOUNTER — Encounter: Payer: Self-pay | Admitting: Medical

## 2014-03-16 VITALS — BP 145/78 | HR 52 | Temp 97.5°F | Ht 70.0 in | Wt 237.4 lb

## 2014-03-16 DIAGNOSIS — J069 Acute upper respiratory infection, unspecified: Secondary | ICD-10-CM | POA: Diagnosis not present

## 2014-03-16 MED ORDER — BENZONATATE 100 MG PO CAPS: 100.0000 mg | ORAL_CAPSULE | Freq: Three times a day (TID) | ORAL | Status: DC | PRN

## 2014-03-16 NOTE — Patient Instructions (Addendum)
I think you are dealing with viral upper respiratory infection as well some allergies. By exam I don't think antibiotic needed immediately.  I want you to continue you flonase, proair and I am prescribing benzonatate for cough if needed.  If you are not improved by Thursday of this week I would at that point take the doxycycline you have available as you are subject to sinus infection and bronchitis in the past.  Follow up in 7 days or as needed.

## 2014-03-16 NOTE — Assessment & Plan Note (Signed)
I think you are dealing with viral upper respiratory infection as well some allergies. By exam I don't think antibiotic needed immediately.  I want you to continue you flonase, proair and I am prescribing benzonatate for cough if needed.  If you are not improved by Thursday of this week I would at that point take the doxycycline you have available as you are subject to sinus infection and bronchitis in the past.

## 2014-03-16 NOTE — Progress Notes (Signed)
Lb

## 2014-03-16 NOTE — Progress Notes (Signed)
Subjective:    Patient ID: Kirk Sutton, male    DOB: 12-May-1950, 63 y.o.   MRN: [id number]  HPI   Pt in today reporting cough, nasal congestion and runny nose for  5  Days. Started with very mild sore throat.   Pt states he gets quick sinus infection and bronchitis in the past.  Pt has an rx of doxycycline but he never filled it in November.  Associated symptoms( below yes or no)  Fever-no Chills-no Chest congestion-yes Sneezing- yes Itching eyes-no Sore throat-yes. Only early on.  Post-nasal drainage-no Wheezing-yes. Very minimal. He used proair 2 times yesterday and 1 time today. Purulent drainage- Fatigue- mild.  Pt has benzonatate but running low on those tabs. And albuterol(proair) available if wheezes. Doxycycline available.  Past Medical History  Diagnosis Date  . Asthma   . Hypertension   . Allergic rhinitis   . Hyperlipidemia   . Chronic prostatitis     sees urology  . Diabetes mellitus     type 11 (a1c 6.0 03/2008)  . Psoriasis     @ hands, sees derm    History   Social History  . Marital Status: Married    Spouse Name: N/A    Number of Children: 2  . Years of Education: N/A   Occupational History  . Water treatment Materials engineer    Social History Main Topics  . Smoking status: Never Smoker   . Smokeless tobacco: Never Used  . Alcohol Use: Yes     Comment: socially  . Drug Use: No  . Sexual Activity: Not on file   Other Topics Concern  . Not on file   Social History Narrative    Past Surgical History  Procedure Laterality Date  . Hernia repair    . Appendectomy    . Fertility surgery  1979  . Testicle surgery      undescended repair   . Shoulder surgery  2008    left  . Nasal septum surgery  2009    Family History  Problem Relation Age of Onset  . Diabetes Sister   . Hypertension Mother   . Hypertension Sister   . Heart attack Father     x 3 onset age 2, died at 49  . Colon polyps Mother   . Colon polyps  Father   . Colon cancer Neg Hx   . Prostate cancer Neg Hx   . Cancer Father     bladder    Allergies  Allergen Reactions  . Cetirizine Hcl     REACTION: prostatitis  . Fexofenadine     REACTION: causes prostatitis    Current Outpatient Prescriptions on File Prior to Visit  Medication Sig Dispense Refill  . aspirin (ECOTRIN) 325 MG EC tablet Take 325 mg by mouth daily.    . beclomethasone (QVAR) 40 MCG/ACT inhaler Inhale 2 puffs into the lungs 2 (two) times daily at 10 AM and 5 PM. 1 Inhaler 11  . fluticasone (FLONASE) 50 MCG/ACT nasal spray Place 2 sprays into both nostrils daily. 16 g 1  . hydrochlorothiazide (HYDRODIURIL) 25 MG tablet Take 1 tablet (25 mg total) by mouth daily. 90 tablet 1  . hydrocortisone 2.5 % ointment Apply topically 2 (two) times daily.      [name] Kitchen loratadine (CLARITIN) 10 MG tablet Take 10 mg by mouth daily.      . meloxicam (MOBIC) 15 MG tablet Take 15 mg by mouth daily.    [name] Kitchen  metoprolol succinate (TOPROL-XL) 50 MG 24 hr tablet Take 1 tablet (50 mg total) by mouth daily. 15 tablet 0  . Multiple Vitamins-Minerals (PROTEGRA PO) Take by mouth.      . NON FORMULARY Isogenics daily.    [name] Kitchen PROAIR HFA 108 (90 BASE) MCG/ACT inhaler INHALE 2 PUFFS EVERY 6 HOURS AS NEEDED 8.5 g 0  . terazosin (HYTRIN) 1 MG capsule TAKE 3 CAPSULES BY MOUTH EVERY DAY 270 capsule 3  . traMADol (ULTRAM) 50 MG tablet Take 50-100 mg by mouth every 4 (four) hours as needed.    . vitamin E (VITAMIN E) 400 UNIT capsule Take 400 Units by mouth daily.       No current facility-administered medications on file prior to visit.    BP 145/78 mmHg  Pulse 52  Temp(Src) 97.5 F (36.4 C) (Oral)  Ht 5\' 10"  (1.778 m)  Wt 237 lb 6.4 oz (107.684 kg)  BMI 34.06 kg/m2  SpO2 96%        Review of Systems  Constitutional: Negative for fever, chills and fatigue.  HENT: Positive for congestion, postnasal drip, sinus pressure and sore throat.        Sinus pressure this am.  Respiratory: Positive for  cough and wheezing.        Faint wheeze. Some productive cough.  Cardiovascular: Negative for chest pain and palpitations.  Musculoskeletal: Negative for back pain and neck pain.  Neurological: Negative for dizziness and headaches.  Hematological: Negative for adenopathy. Does not bruise/bleed easily.       Objective:   Physical Exam   General  Mental Status - Alert. General Appearance - Well groomed. Not in acute distress.  Skin Rashes- No Rashes.  HEENT Head- Normal. Ear Auditory Canal - Left- Normal. Right - Normal.Tympanic Membrane- Left- Normal. Right- Normal. Eye Sclera/Conjunctiva- Left- Normal. Right- Normal. Nose & Sinuses Nasal Mucosa- Left-  Mild boggy + Congested. Right-  Mild boggy + Congested. Mouth & Throat Lips: Upper Lip- Normal: no dryness, cracking, pallor, cyanosis, or vesicular eruption. Lower Lip-Normal: no dryness, cracking, pallor, cyanosis or vesicular eruption. Buccal Mucosa- Bilateral- No Aphthous ulcers. Oropharynx- No Discharge or Erythema. Tonsils: Characteristics- Bilateral- No Erythema or Congestion. Size/Enlargement- Bilateral- No enlargement. Discharge- bilateral-None.  Neck Neck- Supple. No Masses.   Chest and Lung Exam Auscultation: Breath Sounds:- even and unlabored.  Cardiovascular Auscultation:Rythm- Regular, rate and rhythm. Murmurs & Other Heart Sounds:Ausculatation of the heart reveal- No Murmurs.  Lymphatic Head & Neck General Head & Neck Lymphatics: Bilateral: Description- No Localized lymphadenopathy.         Assessment & Plan:

## 2014-04-15 ENCOUNTER — Other Ambulatory Visit: Payer: Self-pay | Admitting: Internal Medicine

## 2014-05-13 DIAGNOSIS — N508 Other specified disorders of male genital organs: Secondary | ICD-10-CM | POA: Diagnosis not present

## 2014-05-13 DIAGNOSIS — N400 Enlarged prostate without lower urinary tract symptoms: Secondary | ICD-10-CM | POA: Diagnosis not present

## 2014-06-23 ENCOUNTER — Other Ambulatory Visit: Payer: Self-pay

## 2014-06-28 DIAGNOSIS — L84 Corns and callosities: Secondary | ICD-10-CM | POA: Diagnosis not present

## 2014-06-28 DIAGNOSIS — M2021 Hallux rigidus, right foot: Secondary | ICD-10-CM | POA: Diagnosis not present

## 2014-07-14 ENCOUNTER — Other Ambulatory Visit: Payer: Self-pay | Admitting: Internal Medicine

## 2014-07-19 ENCOUNTER — Telehealth: Payer: Self-pay | Admitting: Internal Medicine

## 2014-07-19 ENCOUNTER — Other Ambulatory Visit: Payer: Self-pay

## 2014-07-19 ENCOUNTER — Other Ambulatory Visit: Payer: Self-pay | Admitting: Internal Medicine

## 2014-07-19 NOTE — Telephone Encounter (Signed)
FYI

## 2014-07-19 NOTE — Telephone Encounter (Signed)
Pt has called to follow up on this medication. He states insurance is only approving use 1x per day. Pt is using 3x per day of terazosin (HYTRIN) 1 MG capsule [[id number]]. Has meds for today and tomorrow. Pt concerned for having to pay out of pocket for remaining 60 doses per month. Best ph # for pt [phone].

## 2014-07-19 NOTE — Telephone Encounter (Signed)
Caller name:  [id number] - [place], [address] AT [facility] [place] [phone] (Phone) [phone] (Fax)         Reason for call:  As per pharmacy requesting prior-auth for terazosin (HYTRIN) 1 MG capsule

## 2014-07-20 NOTE — Telephone Encounter (Signed)
Spoke with a PA rep at [facility] and they stated all the needed info was received and they will send it over to the clinical dept today and we will be notified with a decision promptly. I requested an expedited decision due to the fact that pt is almost out of meds. JG//CMA

## 2014-07-20 NOTE — Telephone Encounter (Signed)
Resubmitted PA for quantity limit exception. Awaiting determination. JG//CMA

## 2014-07-20 NOTE — Telephone Encounter (Signed)
PA approved effective from 07/20/2014 through 07/20/2015. JG//CMA

## 2014-08-05 ENCOUNTER — Other Ambulatory Visit: Payer: Self-pay

## 2014-08-05 DIAGNOSIS — F900 Attention-deficit hyperactivity disorder, predominantly inattentive type: Secondary | ICD-10-CM | POA: Diagnosis not present

## 2014-08-19 DIAGNOSIS — F900 Attention-deficit hyperactivity disorder, predominantly inattentive type: Secondary | ICD-10-CM | POA: Diagnosis not present

## 2014-08-20 DIAGNOSIS — M2021 Hallux rigidus, right foot: Secondary | ICD-10-CM | POA: Diagnosis not present

## 2014-08-20 DIAGNOSIS — M79671 Pain in right foot: Secondary | ICD-10-CM | POA: Diagnosis not present

## 2014-08-27 DIAGNOSIS — H01001 Unspecified blepharitis right upper eyelid: Secondary | ICD-10-CM | POA: Diagnosis not present

## 2014-08-27 DIAGNOSIS — H01004 Unspecified blepharitis left upper eyelid: Secondary | ICD-10-CM | POA: Diagnosis not present

## 2014-08-27 DIAGNOSIS — H01005 Unspecified blepharitis left lower eyelid: Secondary | ICD-10-CM | POA: Diagnosis not present

## 2014-08-27 DIAGNOSIS — H01002 Unspecified blepharitis right lower eyelid: Secondary | ICD-10-CM | POA: Diagnosis not present

## 2014-09-01 ENCOUNTER — Telehealth: Payer: Self-pay | Admitting: *Deleted

## 2014-09-01 ENCOUNTER — Encounter: Payer: Self-pay | Admitting: *Deleted

## 2014-09-01 NOTE — Telephone Encounter (Signed)
Pre-Visit Call completed with patient and chart updated.   Pre-Visit Info documented in Specialty Comments under [employer].    

## 2014-09-03 ENCOUNTER — Encounter: Payer: Self-pay | Admitting: Internal Medicine

## 2014-09-03 ENCOUNTER — Ambulatory Visit (INDEPENDENT_AMBULATORY_CARE_PROVIDER_SITE_OTHER): Payer: Medicare Other | Admitting: Internal Medicine

## 2014-09-03 VITALS — BP 126/74 | HR 53 | Temp 98.1°F | Ht 70.0 in | Wt 234.4 lb

## 2014-09-03 DIAGNOSIS — E785 Hyperlipidemia, unspecified: Secondary | ICD-10-CM

## 2014-09-03 DIAGNOSIS — I10 Essential (primary) hypertension: Secondary | ICD-10-CM | POA: Diagnosis not present

## 2014-09-03 DIAGNOSIS — R7309 Other abnormal glucose: Secondary | ICD-10-CM

## 2014-09-03 DIAGNOSIS — R52 Pain, unspecified: Secondary | ICD-10-CM

## 2014-09-03 DIAGNOSIS — Z0000 Encounter for general adult medical examination without abnormal findings: Secondary | ICD-10-CM | POA: Diagnosis not present

## 2014-09-03 DIAGNOSIS — Z23 Encounter for immunization: Secondary | ICD-10-CM

## 2014-09-03 DIAGNOSIS — R7303 Prediabetes: Secondary | ICD-10-CM

## 2014-09-03 DIAGNOSIS — F411 Generalized anxiety disorder: Secondary | ICD-10-CM

## 2014-09-03 DIAGNOSIS — J4521 Mild intermittent asthma with (acute) exacerbation: Secondary | ICD-10-CM

## 2014-09-03 LAB — CBC WITH DIFFERENTIAL/PLATELET
Basophils Absolute: 0 10*3/uL (ref 0.0–0.1)
Basophils Relative: 0.5 % (ref 0.0–3.0)
Eosinophils Absolute: 0.2 10*3/uL (ref 0.0–0.7)
Eosinophils Relative: 2.9 % (ref 0.0–5.0)
HEMATOCRIT: 39.7 % (ref 39.0–52.0)
HEMOGLOBIN: 13.5 g/dL (ref 13.0–17.0)
Lymphocytes Relative: 27.3 % (ref 12.0–46.0)
Lymphs Abs: 1.7 10*3/uL (ref 0.7–4.0)
MCHC: 34.1 g/dL (ref 30.0–36.0)
MCV: 93.3 fl (ref 78.0–100.0)
MONO ABS: 0.4 10*3/uL (ref 0.1–1.0)
Monocytes Relative: 6.5 % (ref 3.0–12.0)
Neutro Abs: 3.8 10*3/uL (ref 1.4–7.7)
Neutrophils Relative %: 62.8 % (ref 43.0–77.0)
Platelets: 133 10*3/uL — ABNORMAL LOW (ref 150.0–400.0)
RBC: 4.25 Mil/uL (ref 4.22–5.81)
RDW: 13.6 % (ref 11.5–15.5)
WBC: 6.1 10*3/uL (ref 4.0–10.5)

## 2014-09-03 LAB — LIPID PANEL
Cholesterol: 128 mg/dL (ref 0–200)
HDL: 42.9 mg/dL (ref >39.00)
LDL Cholesterol: 69 mg/dL (ref 0–99)
NONHDL: 85.1
Total CHOL/HDL Ratio: 3
Triglycerides: 81 mg/dL (ref 0.0–149.0)
VLDL: 16.2 mg/dL (ref 0.0–40.0)

## 2014-09-03 LAB — BASIC METABOLIC PANEL
BUN: 18 mg/dL (ref 6–23)
CO2: 31 mEq/L (ref 19–32)
CREATININE: 1.17 mg/dL (ref 0.40–1.50)
Calcium: 9.2 mg/dL (ref 8.4–10.5)
Chloride: 101 mEq/L (ref 96–112)
GFR: 66.21 mL/min (ref >60.00)
Glucose, Bld: 110 mg/dL — ABNORMAL HIGH (ref 70–99)
Potassium: 3.6 mEq/L (ref 3.5–5.1)
Sodium: 137 mEq/L (ref 135–145)

## 2014-09-03 LAB — HEMOGLOBIN A1C: HEMOGLOBIN A1C: 5.8 % (ref 4.6–6.5)

## 2014-09-03 MED ORDER — METOPROLOL SUCCINATE ER 50 MG PO TB24: 50.0000 mg | ORAL_TABLET | Freq: Every day | ORAL | Status: DC

## 2014-09-03 MED ORDER — TERAZOSIN HCL 1 MG PO CAPS: 3.0000 mg | ORAL_CAPSULE | Freq: Every day | ORAL | Status: DC

## 2014-09-03 MED ORDER — HYDROCHLOROTHIAZIDE 25 MG PO TABS: 25.0000 mg | ORAL_TABLET | Freq: Every day | ORAL | Status: DC

## 2014-09-03 NOTE — Assessment & Plan Note (Signed)
Td 2013 PNM 23--2015 prevnar-- today  Zostavax 2013 Discussed diet and exercise  Colonoscopy Dr Vladimir Faster 2005 (-), overdue for a Cscope, pt aware , encouraged to see GI Cont healthy life style Sees urology yearly around October.

## 2014-09-03 NOTE — Assessment & Plan Note (Signed)
History of prediabetes, check A1c. He remains active. Sees the eye doctor yearly

## 2014-09-03 NOTE — Progress Notes (Signed)
Pre visit review using our clinic review tool, if applicable. No additional management support is needed unless otherwise documented below in the visit note. 

## 2014-09-03 NOTE — Progress Notes (Signed)
Subjective:    Patient ID: Kirk Sutton, male    DOB: [birth year], 64 y.o.   MRN: [id number]  DOS:  09/03/2014 Type of visit - description :   Here for Medicare AWV:  1. Risk factors based on Past M, S, F history: reviewed 2. Physical Activities:  Plays golf, walks   2-3/week 3. Depression/mood: No depression but ++ stress, sees a Social worker, ADD? To see psychiatry soon   4. Hearing:  No problemss noted or reported   5. ADL's:  Totally independent   6. Fall Risk: low, pt educated about prevention 7. home Safety: does feel safe at home   8. Height, weight, & visual acuity: see VS, uses contacts, sees eye doctor q year 9. Counseling: provided 10. Labs ordered based on risk factors: if needed   11. Referral Coordination: if needed 12. Care Plan, see assessment and plan   13. Cognitive Assessment: motor and cognition normal 14. Care team updated  15. End of life care discussed   In addition, today we discussed the following:  Hypertension, good compliance of medication, reports normal ambulatory BPs Diabetes, on no medications, he remains active Chronic prostatitis, sees urology  Asthma, essentially asymptomatic, has not used inhalers lately  Except for occ albuterol Ongoing back pain, request a physical therapy referral    Review of Systems Constitutional: No fever. No chills. No unexplained wt changes. No unusual sweats  HEENT: No dental problems, no ear discharge, no facial swelling, no voice changes. No eye discharge, no eye  redness , no  intolerance to light   Respiratory: occ wheezing , no  difficulty breathing. No cough , no mucus production  Cardiovascular: No CP, no leg swelling , no  Palpitations  GI: no nausea, no vomiting, no diarrhea , no  abdominal pain.  No blood in the stools. No dysphagia, no odynophagia    Endocrine: No polyphagia, no polyuria , no polydipsia  GU: No dysuria, gross hematuria, difficulty urinating. No urinary urgency, no  frequency.  Musculoskeletal: No joint swellings or unusual aches or pains  Skin: No change in the color of the skin, palor , no  Rash  Allergic, immunologic: No environmental allergies , no  food allergies  Neurological: No dizziness no  syncope. No headaches. No diplopia, no slurred, no slurred speech, no motor deficits, no facial  Numbness  Hematological: No enlarged lymph nodes, no easy bruising , no unusual bleedings  Psychiatry: No suicidal ideas, no hallucinations, no beavior problems, no confusion.  ++ stress    Past Medical History  Diagnosis Date  . Asthma   . Hypertension   . Allergic rhinitis   . Hyperlipidemia   . Chronic prostatitis     sees urology  . Diabetes mellitus     type 11 (a1c 6.0 03/2008)  . Psoriasis     @ hands, sees derm    Past Surgical History  Procedure Laterality Date  . Hernia repair    . Appendectomy    . Fertility surgery  1979  . Testicle surgery      undescended repair   . Shoulder surgery  2008    left  . Nasal septum surgery  2009    History   Social History  . Marital Status: Married    Spouse Name: N/A  . Number of Children: 2  . Years of Education: N/A   Occupational History  . Water treatment Materials engineer    Social History Main Topics  .  Smoking status: Never Smoker   . Smokeless tobacco: Never Used  . Alcohol Use: Yes     Comment: socially  . Drug Use: No  . Sexual Activity: Not on file   Other Topics Concern  . Not on file   Social History Narrative   Household-- pt, wife, daughter and 4 G-children    Family History  Problem Relation Age of Onset  . Diabetes Sister   . Hypertension Mother   . Hypertension Sister   . Heart attack Father     x 3 onset age 75, died at 47  . Colon polyps Mother   . Colon polyps Father   . Colon cancer Neg Hx   . Prostate cancer Neg Hx   . Cancer Father     bladder         Medication List       This list is accurate as of: 09/03/14 11:59 PM.   Always use your most recent med list.               ammonium lactate 12 % lotion  Commonly known as:  LAC-HYDRIN  Apply to feet twice daily.     ECOTRIN 325 MG EC tablet  Generic drug:  aspirin  Take 325 mg by mouth daily.     fluocinonide cream 0.05 %  Commonly known as:  LIDEX  Apply externally to the affected area twice daily (For hands and feet)     fluticasone 50 MCG/ACT nasal spray  Commonly known as:  FLONASE  Place 2 sprays into both nostrils daily.     hydrochlorothiazide 25 MG tablet  Commonly known as:  HYDRODIURIL  Take 1 tablet (25 mg total) by mouth daily.     hydrocortisone 2.5 % ointment  Apply topically 2 (two) times daily.     loratadine 10 MG tablet  Commonly known as:  CLARITIN  Take 10 mg by mouth daily.     metoprolol succinate 50 MG 24 hr tablet  Commonly known as:  TOPROL-XL  Take 1 tablet (50 mg total) by mouth daily.     NON FORMULARY  Isogenics daily.     PROAIR HFA 108 (90 BASE) MCG/ACT inhaler  Generic drug:  albuterol  INHALE 2 PUFFS EVERY 6 HOURS AS NEEDED     PROTEGRA PO  Take by mouth.     terazosin 1 MG capsule  Commonly known as:  HYTRIN  Take 3 capsules (3 mg total) by mouth daily.     traMADol 50 MG tablet  Commonly known as:  ULTRAM  Take 50-100 mg by mouth every 4 (four) hours as needed.     vitamin E 400 UNIT capsule  Generic drug:  vitamin E  Take 400 Units by mouth daily.           Objective:   Physical Exam BP 126/74 mmHg  Pulse 53  Temp(Src) 98.1 F (36.7 C) (Oral)  Ht 5\' 10"  (1.778 m)  Wt 234 lb 6 oz (106.312 kg)  BMI 33.63 kg/m2  SpO2 99%  General:   Well developed, well nourished . NAD.  HEENT:  Normocephalic . Face symmetric, atraumatic Neck: No thyromegaly Lungs:  CTA B Normal respiratory effort, no intercostal retractions, no accessory muscle use. Heart: RRR,  no murmur.  no pretibial edema bilaterally  Abdomen:  Not distended, soft, non-tender. No rebound or rigidity. No  mass,organomegaly. No bruit Skin: Not pale. Not jaundice Neurologic:  alert & oriented X3.  Speech normal, gait  appropriate for age and unassisted Psych--  Cognition and judgment appear intact.  Cooperative with normal attention span and concentration.  Behavior appropriate. No anxious or depressed appearing.      Assessment & Plan:

## 2014-09-03 NOTE — Patient Instructions (Signed)
Get your blood work before you leave       [place] cause injuries and can affect all age groups. It is possible to use preventive measures to significantly decrease the likelihood of falls. There are many simple measures which can make your home safer and prevent falls. OUTDOORS  Repair cracks and edges of walkways and driveways.  Remove high doorway thresholds.  Trim shrubbery on the main path into your home.  Have good outside lighting.  Clear walkways of tools, rocks, debris, and clutter.  Check that handrails are not broken and are securely fastened. Both sides of steps should have handrails.  Have leaves, snow, and ice cleared regularly.  Use sand or salt on walkways during winter months.  In the garage, clean up grease or oil spills. BATHROOM  Install night lights.  Install grab bars by the toilet and in the tub and shower.  Use non-skid mats or decals in the tub or shower.  Place a plastic non-slip stool in the shower to sit on, if needed.  Keep floors dry and clean up all water on the floor immediately.  Remove soap buildup in the tub or shower on a regular basis.  Secure bath mats with non-slip, double-sided rug tape.  Remove throw rugs and tripping hazards from the floors. BEDROOMS  Install night lights.  Make sure a bedside light is easy to reach.  Do not use oversized bedding.  Keep a telephone by your bedside.  Have a firm chair with side arms to use for getting dressed.  Remove throw rugs and tripping hazards from the floor. KITCHEN  Keep handles on pots and pans turned toward the center of the stove. Use back burners when possible.  Clean up spills quickly and allow time for drying.  Avoid walking on wet floors.  Avoid hot utensils and knives.  Position shelves so they are not too high or low.  Place commonly used objects within easy reach.  If necessary, use a sturdy step stool with a grab bar when  reaching.  Keep electrical cables out of the way.  Do not use floor polish or wax that makes floors slippery. If you must use wax, use non-skid floor wax.  Remove throw rugs and tripping hazards from the floor. STAIRWAYS  Never leave objects on stairs.  Place handrails on both sides of stairways and use them. Fix any loose handrails. Make sure handrails on both sides of the stairways are as long as the stairs.  Check carpeting to make sure it is firmly attached along stairs. Make repairs to worn or loose carpet promptly.  Avoid placing throw rugs at the top or bottom of stairways, or properly secure the rug with carpet tape to prevent slippage. Get rid of throw rugs, if possible.  Have an electrician put in a light switch at the top and bottom of the stairs. OTHER FALL PREVENTION TIPS  Wear low-heel or rubber-soled shoes that are supportive and fit well. Wear closed toe shoes.  When using a stepladder, make sure it is fully opened and both spreaders are firmly locked. Do not climb a closed stepladder.  Add color or contrast paint or tape to grab bars and handrails in your home. Place contrasting color strips on first and last steps.  Learn and use mobility aids as needed. Install an electrical emergency response system.  Turn on lights to avoid dark areas. Replace light bulbs that burn out immediately. Get light switches that glow.  Arrange furniture to create clear pathways. Keep furniture in the same place.  Firmly attach carpet with non-skid or double-sided tape.  Eliminate uneven floor surfaces.  Select a carpet pattern that does not visually hide the edge of steps.  Be aware of all pets. OTHER HOME SAFETY TIPS  Set the water temperature for 120 F (48.8 C).  Keep emergency numbers on or near the telephone.  Keep smoke detectors on every level of the home and near sleeping areas. Document Released: 02/23/2002 Document Revised: 09/04/2011 Document Reviewed:  05/25/2011 [facility] Patient Information 2015 [place], [place]. This information is not intended to replace advice given to you by your health care provider. Make sure you discuss any questions you have with your health care provider.   Preventive Care for Adults Ages 66 and over  Blood pressure check.** / Every 1 to 2 years.  Lipid and cholesterol check.**/ Every 5 years beginning at age 66.  Lung cancer screening. / Every year if you are aged 15-80 years and have a 30-pack-year history of smoking and currently smoke or have quit within the past 15 years. Yearly screening is stopped once you have quit smoking for at least 15 years or develop a health problem that would prevent you from having lung cancer treatment.  Fecal occult blood test (FOBT) of stool. / Every year beginning at age 66 and continuing until age 101. You may not have to do this test if you get a colonoscopy every 10 years.  Flexible sigmoidoscopy** or colonoscopy.** / Every 5 years for a flexible sigmoidoscopy or every 10 years for a colonoscopy beginning at age 66 and continuing until age 30.  Hepatitis C blood test.** / For all people born from [birth year] through 1967 and any individual with known risks for hepatitis C.  Abdominal aortic aneurysm (AAA) screening.** / A one-time screening for ages 66 to 25 years who are current or former smokers.  Skin self-exam. / Monthly.  Influenza vaccine. / Every year.  Tetanus, diphtheria, and acellular pertussis (Tdap/Td) vaccine.** / 1 dose of Td every 10 years.  Varicella vaccine.** / Consult your health care provider.  Zoster vaccine.** / 1 dose for adults aged 66 years or older.  Pneumococcal 13-valent conjugate (PCV13) vaccine.** / Consult your health care provider.  Pneumococcal polysaccharide (PPSV23) vaccine.** / 1 dose for all adults aged 58 years and older.  Meningococcal vaccine.** / Consult your health care provider.  Hepatitis A vaccine.** / Consult your health care  provider.  Hepatitis B vaccine.** / Consult your health care provider.  Haemophilus influenzae type b (Hib) vaccine.** / Consult your health care provider. **Family history and personal history of risk and conditions may change your health care provider's recommendations. Document Released: 05/01/2001 Document Revised: 03/10/2013 Document Reviewed: 07/31/2010 [facility] Patient Information 2015 [place], [place]. This information is not intended to replace advice given to you by your health care provider. Make sure you discuss any questions you have with your health care provider.

## 2014-09-03 NOTE — Assessment & Plan Note (Signed)
Checking labs, on diet only

## 2014-09-03 NOTE — Assessment & Plan Note (Addendum)
Not taking meloxicam, DC from med list Sees Dr. Nelva Bush, status post local injections, back pain is still an issue The patient request a referral for physical therapy actually at [facility], states that he told with a physical therapist there and they can help him.

## 2014-09-03 NOTE — Assessment & Plan Note (Signed)
+   Stress related to family issues, sees counseling, will sees psychiatry, they are considering the diagnosis of ADD.

## 2014-09-03 NOTE — Assessment & Plan Note (Signed)
Essentially asymptomatic, very rarely uses albuterol. Not using Qvar, DC from med list

## 2014-09-03 NOTE — Assessment & Plan Note (Signed)
Symptoms well controlled with hydrochlorothiazide, Terazosin and metoprolol. Check a BMP and CBC

## 2014-09-06 ENCOUNTER — Telehealth: Payer: Self-pay | Admitting: Internal Medicine

## 2014-09-06 DIAGNOSIS — R52 Pain, unspecified: Secondary | ICD-10-CM

## 2014-09-06 NOTE — Telephone Encounter (Signed)
Referral placed for OT.  

## 2014-09-06 NOTE — Telephone Encounter (Signed)
Caller name: Sherif Millspaugh Relationship to patient:  self Can be reached: [phone]  Reason for call: Pt said that he is needing referral for occupational therapy. He said we submitted for physical therapy and we need to redo the referral for him to [facility], occupational therapy.

## 2014-09-07 DIAGNOSIS — F900 Attention-deficit hyperactivity disorder, predominantly inattentive type: Secondary | ICD-10-CM | POA: Diagnosis not present

## 2014-09-07 DIAGNOSIS — F413 Other mixed anxiety disorders: Secondary | ICD-10-CM | POA: Diagnosis not present

## 2014-09-13 ENCOUNTER — Other Ambulatory Visit: Payer: Self-pay

## 2014-09-14 DIAGNOSIS — F413 Other mixed anxiety disorders: Secondary | ICD-10-CM | POA: Diagnosis not present

## 2014-09-16 DIAGNOSIS — M6200 Separation of muscle (nontraumatic), unspecified site: Secondary | ICD-10-CM | POA: Diagnosis not present

## 2014-09-16 DIAGNOSIS — M62838 Other muscle spasm: Secondary | ICD-10-CM | POA: Diagnosis not present

## 2014-09-16 DIAGNOSIS — M5440 Lumbago with sciatica, unspecified side: Secondary | ICD-10-CM | POA: Diagnosis not present

## 2014-09-16 DIAGNOSIS — R278 Other lack of coordination: Secondary | ICD-10-CM | POA: Diagnosis not present

## 2014-10-01 DIAGNOSIS — R278 Other lack of coordination: Secondary | ICD-10-CM | POA: Diagnosis not present

## 2014-10-01 DIAGNOSIS — M5440 Lumbago with sciatica, unspecified side: Secondary | ICD-10-CM | POA: Diagnosis not present

## 2014-10-01 DIAGNOSIS — M62838 Other muscle spasm: Secondary | ICD-10-CM | POA: Diagnosis not present

## 2014-10-01 DIAGNOSIS — M6200 Separation of muscle (nontraumatic), unspecified site: Secondary | ICD-10-CM | POA: Diagnosis not present

## 2014-10-07 NOTE — Telephone Encounter (Addendum)
Received OT plans from [facility], they requested Dr. Larose Kells review, sign plan and fax back to [facility] at [phone] ATTN: Medical Records. Received fax confirmation on 10/07/2014 at 0933. OT plans sent for scanning into chart.

## 2014-10-08 DIAGNOSIS — M5440 Lumbago with sciatica, unspecified side: Secondary | ICD-10-CM | POA: Diagnosis not present

## 2014-10-08 DIAGNOSIS — M6200 Separation of muscle (nontraumatic), unspecified site: Secondary | ICD-10-CM | POA: Diagnosis not present

## 2014-10-08 DIAGNOSIS — M62838 Other muscle spasm: Secondary | ICD-10-CM | POA: Diagnosis not present

## 2014-10-08 DIAGNOSIS — R278 Other lack of coordination: Secondary | ICD-10-CM | POA: Diagnosis not present

## 2014-10-14 ENCOUNTER — Other Ambulatory Visit: Payer: Self-pay | Admitting: Internal Medicine

## 2014-10-16 ENCOUNTER — Other Ambulatory Visit: Payer: Self-pay | Admitting: Internal Medicine

## 2014-10-20 DIAGNOSIS — M62838 Other muscle spasm: Secondary | ICD-10-CM | POA: Diagnosis not present

## 2014-10-20 DIAGNOSIS — M5440 Lumbago with sciatica, unspecified side: Secondary | ICD-10-CM | POA: Diagnosis not present

## 2014-10-20 DIAGNOSIS — M542 Cervicalgia: Secondary | ICD-10-CM | POA: Diagnosis not present

## 2014-10-20 DIAGNOSIS — R278 Other lack of coordination: Secondary | ICD-10-CM | POA: Diagnosis not present

## 2014-10-29 ENCOUNTER — Ambulatory Visit (INDEPENDENT_AMBULATORY_CARE_PROVIDER_SITE_OTHER): Payer: Medicare Other | Admitting: Podiatry

## 2014-10-29 ENCOUNTER — Encounter: Payer: Self-pay | Admitting: Podiatry

## 2014-10-29 VITALS — BP 145/81 | HR 49 | Resp 18

## 2014-10-29 DIAGNOSIS — L309 Dermatitis, unspecified: Secondary | ICD-10-CM

## 2014-10-29 DIAGNOSIS — M2020 Hallux rigidus, unspecified foot: Secondary | ICD-10-CM

## 2014-10-29 DIAGNOSIS — M205X9 Other deformities of toe(s) (acquired), unspecified foot: Secondary | ICD-10-CM

## 2014-10-29 NOTE — Progress Notes (Signed)
Subjective:     Patient ID: Kirk Sutton, male   DOB: 13-May-1950, 64 y.o.   MRN: [id number]  HPIThis patient presents for treatment of his feet.  He says his heel callus has returned and is fizzuring.  He says the dermatitis on his ankles has returned and he needs medicine.  Finally some pain is noted under the ball of his left foot.  He desires treatment of these conditions.   Review of Systems     Objective:   Physical Exam GENERAL APPEARANCE: Alert, conversant. Appropriately groomed. No acute distress.  VASCULAR: Pedal pulses palpable at  [facility] and PT bilateral.  Capillary refill time is immediate to all digits,  Normal temperature gradient.  Digital hair growth is present bilateral  NEUROLOGIC: sensation is normal to 5.07 monofilament at 5/5 sites bilateral.  Light touch is intact bilateral, Muscle strength normal.  MUSCULOSKELETAL: acceptable muscle strength, tone and stability bilateral.  Intrinsic muscluature intact bilateral.  Rectus appearance of foot and digits noted bilateral. Hallux limitus 1st MPJ  Right greater than left.  Pain noted on palpation through his left forefoot.  No redness or swelling or pain on ROM  2-5  DERMATOLOGIC: skin color, texture, and turgor are within normal limits.  No preulcerative lesions or ulcers  are seen, no interdigital maceration noted.  No open lesions present.  Digital nails are asymptomatic. No drainage noted.Keratoderma climacticum heels B/L.      Assessment:     Dermatitis  Keratoderma climacticum  Hallux Limitus B/L     Plan:     ROV  Prescribed Mobic 15 mg, Lac-hydrin 12 5 and lidex cream.  RTC prn.

## 2014-11-01 ENCOUNTER — Telehealth: Payer: Self-pay | Admitting: Podiatry

## 2014-11-01 NOTE — Telephone Encounter (Signed)
PT CALLED UPSET SAID HE SEEN DR Prudence Davidson EARLY Friday MORNING AND WAS TO HAVE 3 MEDICATIONS CALLED INTO PHARMACY AND SAID THEY WERE NOT.  PER JENNIFER SHE DID CALL THEM IN LATER THAT DAY.PLEASE CALL PT

## 2014-11-02 NOTE — Telephone Encounter (Signed)
Left message asking pt if he received his medications.  Pt called again and states he did receive the medications.

## 2014-11-04 DIAGNOSIS — M62838 Other muscle spasm: Secondary | ICD-10-CM | POA: Diagnosis not present

## 2014-11-04 DIAGNOSIS — F4323 Adjustment disorder with mixed anxiety and depressed mood: Secondary | ICD-10-CM | POA: Diagnosis not present

## 2014-11-04 DIAGNOSIS — M5440 Lumbago with sciatica, unspecified side: Secondary | ICD-10-CM | POA: Diagnosis not present

## 2014-11-04 DIAGNOSIS — M542 Cervicalgia: Secondary | ICD-10-CM | POA: Diagnosis not present

## 2014-11-04 DIAGNOSIS — R278 Other lack of coordination: Secondary | ICD-10-CM | POA: Diagnosis not present

## 2014-11-09 DIAGNOSIS — F413 Other mixed anxiety disorders: Secondary | ICD-10-CM | POA: Diagnosis not present

## 2014-11-09 DIAGNOSIS — F900 Attention-deficit hyperactivity disorder, predominantly inattentive type: Secondary | ICD-10-CM | POA: Diagnosis not present

## 2014-11-12 DIAGNOSIS — M5440 Lumbago with sciatica, unspecified side: Secondary | ICD-10-CM | POA: Diagnosis not present

## 2014-11-12 DIAGNOSIS — M542 Cervicalgia: Secondary | ICD-10-CM | POA: Diagnosis not present

## 2014-11-12 DIAGNOSIS — M62838 Other muscle spasm: Secondary | ICD-10-CM | POA: Diagnosis not present

## 2014-11-12 DIAGNOSIS — R278 Other lack of coordination: Secondary | ICD-10-CM | POA: Diagnosis not present

## 2014-11-18 DIAGNOSIS — B078 Other viral warts: Secondary | ICD-10-CM | POA: Diagnosis not present

## 2014-11-18 DIAGNOSIS — L2084 Intrinsic (allergic) eczema: Secondary | ICD-10-CM | POA: Diagnosis not present

## 2014-11-18 DIAGNOSIS — L570 Actinic keratosis: Secondary | ICD-10-CM | POA: Diagnosis not present

## 2014-11-18 DIAGNOSIS — L821 Other seborrheic keratosis: Secondary | ICD-10-CM | POA: Diagnosis not present

## 2014-12-02 DIAGNOSIS — M6200 Separation of muscle (nontraumatic), unspecified site: Secondary | ICD-10-CM | POA: Diagnosis not present

## 2014-12-02 DIAGNOSIS — M5440 Lumbago with sciatica, unspecified side: Secondary | ICD-10-CM | POA: Diagnosis not present

## 2014-12-02 DIAGNOSIS — M542 Cervicalgia: Secondary | ICD-10-CM | POA: Diagnosis not present

## 2014-12-02 DIAGNOSIS — R278 Other lack of coordination: Secondary | ICD-10-CM | POA: Diagnosis not present

## 2014-12-02 DIAGNOSIS — M62838 Other muscle spasm: Secondary | ICD-10-CM | POA: Diagnosis not present

## 2014-12-09 DIAGNOSIS — H10413 Chronic giant papillary conjunctivitis, bilateral: Secondary | ICD-10-CM | POA: Diagnosis not present

## 2014-12-09 DIAGNOSIS — H35033 Hypertensive retinopathy, bilateral: Secondary | ICD-10-CM | POA: Diagnosis not present

## 2014-12-09 DIAGNOSIS — H524 Presbyopia: Secondary | ICD-10-CM | POA: Diagnosis not present

## 2014-12-09 DIAGNOSIS — H5213 Myopia, bilateral: Secondary | ICD-10-CM | POA: Diagnosis not present

## 2014-12-09 DIAGNOSIS — H2513 Age-related nuclear cataract, bilateral: Secondary | ICD-10-CM | POA: Diagnosis not present

## 2014-12-09 DIAGNOSIS — E119 Type 2 diabetes mellitus without complications: Secondary | ICD-10-CM | POA: Diagnosis not present

## 2014-12-23 DIAGNOSIS — M62838 Other muscle spasm: Secondary | ICD-10-CM | POA: Diagnosis not present

## 2014-12-23 DIAGNOSIS — M6200 Separation of muscle (nontraumatic), unspecified site: Secondary | ICD-10-CM | POA: Diagnosis not present

## 2014-12-23 DIAGNOSIS — F413 Other mixed anxiety disorders: Secondary | ICD-10-CM | POA: Diagnosis not present

## 2014-12-23 DIAGNOSIS — R278 Other lack of coordination: Secondary | ICD-10-CM | POA: Diagnosis not present

## 2014-12-23 DIAGNOSIS — M5440 Lumbago with sciatica, unspecified side: Secondary | ICD-10-CM | POA: Diagnosis not present

## 2014-12-30 ENCOUNTER — Ambulatory Visit (INDEPENDENT_AMBULATORY_CARE_PROVIDER_SITE_OTHER): Payer: Medicare Other | Admitting: Podiatry

## 2014-12-30 ENCOUNTER — Encounter: Payer: Self-pay | Admitting: Podiatry

## 2014-12-30 VITALS — BP 155/88 | HR 50 | Resp 14 | Ht 70.0 in | Wt 225.0 lb

## 2014-12-30 DIAGNOSIS — G5762 Lesion of plantar nerve, left lower limb: Secondary | ICD-10-CM

## 2014-12-30 NOTE — Progress Notes (Signed)
   Subjective:    Patient ID: Kirk Sutton, male    DOB: [birth year], 64 y.o.   MRN: [id number]  HPIThis patient presents to the office with pain through his forefoot left foot.  He says he has been very active and has generalized pain which is worsening.  He is experiencing sharp pain through his left forefoot.  No history of trauma.  He already wears orthoses.  He desires evaluation and treatment.    Review of Systems  Musculoskeletal: Positive for back pain.       Joint pain       Objective:   Physical Exam Physical Exam GENERAL APPEARANCE: Alert, conversant. Appropriately groomed. No acute distress.  VASCULAR: Pedal pulses palpable at [facility] and PT bilateral. Capillary refill time is immediate to all digits, Normal temperature gradient. Digital hair growth is present bilateral  NEUROLOGIC: sensation is normal to 5.07 monofilament at 5/5 sites bilateral. Light touch is intact bilateral, Muscle strength normal.  MUSCULOSKELETAL: acceptable muscle strength, tone and stability bilateral. Intrinsic muscluature intact bilateral. Rectus appearance of foot and digits noted bilateral. Hallux limitus 1st MPJ Right greater than left. Pain noted on palpation through his left forefoot. No redness or swelling or pain on ROM 2-5 left foot. Palpable pain through second and third interspace left foot.  DERMATOLOGIC: skin color, texture, and turgor are within normal limits. No preulcerative lesions or ulcers are seen, no interdigital maceration noted. No open lesions present. Digital nails are asymptomatic. No drainage noted.Keratoderma climacticum heels B/L.        Assessment & Plan:  Neuroma 2,3 interspace left foot.   TX  ROV  Injection therapy neuroma left foot..  RTC prn

## 2015-01-06 DIAGNOSIS — F413 Other mixed anxiety disorders: Secondary | ICD-10-CM | POA: Diagnosis not present

## 2015-01-06 DIAGNOSIS — M5440 Lumbago with sciatica, unspecified side: Secondary | ICD-10-CM | POA: Diagnosis not present

## 2015-01-06 DIAGNOSIS — M62838 Other muscle spasm: Secondary | ICD-10-CM | POA: Diagnosis not present

## 2015-01-06 DIAGNOSIS — R278 Other lack of coordination: Secondary | ICD-10-CM | POA: Diagnosis not present

## 2015-01-06 DIAGNOSIS — R102 Pelvic and perineal pain: Secondary | ICD-10-CM | POA: Diagnosis not present

## 2015-01-06 DIAGNOSIS — M6200 Separation of muscle (nontraumatic), unspecified site: Secondary | ICD-10-CM | POA: Diagnosis not present

## 2015-01-17 DIAGNOSIS — M62838 Other muscle spasm: Secondary | ICD-10-CM | POA: Diagnosis not present

## 2015-01-17 DIAGNOSIS — R278 Other lack of coordination: Secondary | ICD-10-CM | POA: Diagnosis not present

## 2015-01-17 DIAGNOSIS — R102 Pelvic and perineal pain: Secondary | ICD-10-CM | POA: Diagnosis not present

## 2015-01-17 DIAGNOSIS — M5440 Lumbago with sciatica, unspecified side: Secondary | ICD-10-CM | POA: Diagnosis not present

## 2015-01-18 DIAGNOSIS — R278 Other lack of coordination: Secondary | ICD-10-CM | POA: Diagnosis not present

## 2015-01-18 DIAGNOSIS — M62838 Other muscle spasm: Secondary | ICD-10-CM | POA: Diagnosis not present

## 2015-01-18 DIAGNOSIS — M5440 Lumbago with sciatica, unspecified side: Secondary | ICD-10-CM | POA: Diagnosis not present

## 2015-01-18 DIAGNOSIS — R102 Pelvic and perineal pain: Secondary | ICD-10-CM | POA: Diagnosis not present

## 2015-01-26 ENCOUNTER — Telehealth: Payer: Self-pay | Admitting: Internal Medicine

## 2015-01-26 MED ORDER — ALBUTEROL SULFATE HFA 108 (90 BASE) MCG/ACT IN AERS: 2.0000 | INHALATION_SPRAY | Freq: Four times a day (QID) | RESPIRATORY_TRACT | Status: DC | PRN

## 2015-01-26 NOTE — Telephone Encounter (Signed)
Caller name: Wacey  Relationship to patient: Self   Can be reached: [phone]  Pharmacy:  Reason for call: Pt says that he would like to renew Rx on his PROAIR medication. Pt says that he like to have some on standby when this time of season occur because of his possible bronchitis.

## 2015-01-26 NOTE — Telephone Encounter (Signed)
Sorry,   Pharmacy : [id number] - [place], [address] AT [place] OF [address]

## 2015-01-26 NOTE — Telephone Encounter (Signed)
Rx sent, #1 inhaler and 4 refills.

## 2015-02-09 DIAGNOSIS — R278 Other lack of coordination: Secondary | ICD-10-CM | POA: Diagnosis not present

## 2015-02-09 DIAGNOSIS — R102 Pelvic and perineal pain: Secondary | ICD-10-CM | POA: Diagnosis not present

## 2015-02-09 DIAGNOSIS — M6200 Separation of muscle (nontraumatic), unspecified site: Secondary | ICD-10-CM | POA: Diagnosis not present

## 2015-02-09 DIAGNOSIS — M62838 Other muscle spasm: Secondary | ICD-10-CM | POA: Diagnosis not present

## 2015-02-09 DIAGNOSIS — N400 Enlarged prostate without lower urinary tract symptoms: Secondary | ICD-10-CM | POA: Diagnosis not present

## 2015-02-09 DIAGNOSIS — M542 Cervicalgia: Secondary | ICD-10-CM | POA: Diagnosis not present

## 2015-02-17 DIAGNOSIS — N138 Other obstructive and reflux uropathy: Secondary | ICD-10-CM | POA: Diagnosis not present

## 2015-02-17 DIAGNOSIS — Z6831 Body mass index (BMI) 31.0-31.9, adult: Secondary | ICD-10-CM | POA: Diagnosis not present

## 2015-02-17 DIAGNOSIS — N401 Enlarged prostate with lower urinary tract symptoms: Secondary | ICD-10-CM | POA: Diagnosis not present

## 2015-02-22 ENCOUNTER — Telehealth: Payer: Self-pay

## 2015-02-22 NOTE — Telephone Encounter (Addendum)
Patient states he was in dentist office and his pulse was taken. Ranged 44-49. Would like to know in your opinion if Prozac  dosage being increased is causing his pulse to be low. States he has an appointment with you soon. Just wants your opinion.Patient advised to go to ED for evaluation.

## 2015-02-23 ENCOUNTER — Telehealth: Payer: Self-pay

## 2015-02-23 ENCOUNTER — Ambulatory Visit (INDEPENDENT_AMBULATORY_CARE_PROVIDER_SITE_OTHER): Payer: Medicare Other | Admitting: Internal Medicine

## 2015-02-23 ENCOUNTER — Encounter: Payer: Self-pay | Admitting: Internal Medicine

## 2015-02-23 VITALS — BP 162/78 | HR 48 | Temp 98.3°F | Ht 70.0 in | Wt 225.2 lb

## 2015-02-23 DIAGNOSIS — Z1159 Encounter for screening for other viral diseases: Secondary | ICD-10-CM | POA: Diagnosis not present

## 2015-02-23 DIAGNOSIS — Z23 Encounter for immunization: Secondary | ICD-10-CM

## 2015-02-23 DIAGNOSIS — F411 Generalized anxiety disorder: Secondary | ICD-10-CM

## 2015-02-23 DIAGNOSIS — I10 Essential (primary) hypertension: Secondary | ICD-10-CM | POA: Diagnosis not present

## 2015-02-23 DIAGNOSIS — Z09 Encounter for follow-up examination after completed treatment for conditions other than malignant neoplasm: Secondary | ICD-10-CM

## 2015-02-23 LAB — BASIC METABOLIC PANEL
BUN: 16 mg/dL (ref 6–23)
CO2: 31 mEq/L (ref 19–32)
Calcium: 9.5 mg/dL (ref 8.4–10.5)
Chloride: 101 mEq/L (ref 96–112)
Creatinine, Ser: 1.11 mg/dL (ref 0.40–1.50)
GFR: 70.25 mL/min (ref >60.00)
Glucose, Bld: 110 mg/dL — ABNORMAL HIGH (ref 70–99)
POTASSIUM: 4.1 meq/L (ref 3.5–5.1)
Sodium: 138 mEq/L (ref 135–145)

## 2015-02-23 LAB — HEPATITIS C ANTIBODY: HCV AB: NEGATIVE

## 2015-02-23 NOTE — Progress Notes (Signed)
Pre visit review using our clinic review tool, if applicable. No additional management support is needed unless otherwise documented below in the visit note. 

## 2015-02-23 NOTE — Telephone Encounter (Signed)
Please call the patient: His pulse is in the low side likely because he takes metoprolol and it has been in the low side before. No need to go to the ER unless he has symptoms: Dizziness, severe weakness, chest pain, difficulty breathing. We can assess that when he comes back to the office

## 2015-02-23 NOTE — Progress Notes (Signed)
Subjective:    Patient ID: Kirk Sutton, male    DOB: [birth year], 64 y.o.   MRN: [id number]  DOS:  02/23/2015 Type of visit - description : Acute visit Interval history: Here because bradycardia: Went to see his dentist last week, pulse was 44. He was feeling well. He check his blood pressure consistently, this morning was slightly high at 156/95, pulse 67. Recheck his BP and it was 157/85 with a pulse of 61. Also, few weeks ago saw psychiatry, feeling much better w/ Prozac.    Review of Systems  Denies chest pain or difficulty breathing No syncope or presyncopal feeling. No weakness. From time to time he gets slightly dizzy when he stands up quickly, not a new issue.  Past Medical History  Diagnosis Date  . Asthma   . Hypertension   . Allergic rhinitis   . Hyperlipidemia   . Chronic prostatitis     sees urology  . Diabetes mellitus     type 11 (a1c 6.0 03/2008)  . Psoriasis     @ hands, sees derm    Past Surgical History  Procedure Laterality Date  . Hernia repair    . Appendectomy    . Fertility surgery  1979  . Testicle surgery      undescended repair   . Shoulder surgery  2008    left  . Nasal septum surgery  2009    Social History   Social History  . Marital Status: Married    Spouse Name: N/A  . Number of Children: 2  . Years of Education: N/A   Occupational History  . Water treatment Materials engineer    Social History Main Topics  . Smoking status: Never Smoker   . Smokeless tobacco: Never Used  . Alcohol Use: Yes     Comment: socially  . Drug Use: No  . Sexual Activity: Not on file   Other Topics Concern  . Not on file   Social History Narrative   Household-- pt, wife, daughter and 4 G-children        Medication List       This list is accurate as of: 02/23/15 11:59 PM.  Always use your most recent med list.               albuterol 108 (90 BASE) MCG/ACT inhaler  Commonly known as:  PROAIR HFA  Inhale 2 puffs into the  lungs every 6 (six) hours as needed.     ammonium lactate 12 % lotion  Commonly known as:  LAC-HYDRIN  Apply to feet twice daily.     beclomethasone 80 MCG/ACT inhaler  Commonly known as:  QVAR  Inhale 2 puffs into the lungs 2 (two) times daily as needed.     dextromethorphan-guaiFENesin 30-600 MG 12hr tablet  Commonly known as:  MUCINEX DM  Take 1 tablet by mouth 2 (two) times daily as needed for cough.     ECOTRIN 325 MG EC tablet  Generic drug:  aspirin  Take 325 mg by mouth every other day.     fluocinonide cream 0.05 %  Commonly known as:  LIDEX  Apply externally to the affected area twice daily (For hands and feet)     FLUoxetine 40 MG capsule  Commonly known as:  PROZAC  Take 1 tablet daily     fluticasone 50 MCG/ACT nasal spray  Commonly known as:  FLONASE  Place 2 sprays into both nostrils daily.  hydrochlorothiazide 25 MG tablet  Commonly known as:  HYDRODIURIL  Take 1 tablet (25 mg total) by mouth daily.     hydrocortisone 2.5 % ointment  Apply topically 2 (two) times daily.     loratadine 10 MG tablet  Commonly known as:  CLARITIN  Take 10 mg by mouth daily.     metoprolol succinate 50 MG 24 hr tablet  Commonly known as:  TOPROL-XL  Take 1 tablet (50 mg total) by mouth daily.     NON FORMULARY  Isogenics daily.     PAZEO 0.7 % Soln  Generic drug:  Olopatadine HCl  INT 1 GTT IN OU QD     PROTEGRA PO  Take by mouth.     terazosin 1 MG capsule  Commonly known as:  HYTRIN  Take 3 capsules (3 mg total) by mouth daily.     traMADol 50 MG tablet  Commonly known as:  ULTRAM  Take 50-100 mg by mouth every 4 (four) hours as needed.     vitamin E 400 UNIT capsule  Generic drug:  vitamin E  Take 400 Units by mouth daily.           Objective:   Physical Exam BP 162/78 mmHg  Pulse 48  Temp(Src) 98.3 F (36.8 C) (Oral)  Ht 5\' 10"  (1.778 m)  Wt 225 lb 4 oz (102.173 kg)  BMI 32.32 kg/m2  SpO2 95% General:   Well developed, well nourished  . NAD.  HEENT:  Normocephalic . Face symmetric, atraumatic Lungs:  CTA B Normal respiratory effort, no intercostal retractions, no accessory muscle use. Heart: RRR,  no murmur.  No pretibial edema bilaterally  Skin: Not pale. Not jaundice Neurologic:  alert & oriented X3.  Speech normal, gait appropriate for age and unassisted Psych--  Cognition and judgment appear intact.  Cooperative with normal attention span and concentration.  Behavior appropriate. No anxious or depressed appearing.      Assessment & Plan:   Assessment> Prediabetes  HTN Hyperlipidemia Anxiety Dr Fay Records (psych) Rx prozac ~12-2014  Asthma MSK: Chronic back pain, pain medication as needed Chronic prostatitis, sees urology Psoriasis, hands, sees dermatology  PLAN: HTN: BPs consistently normal in the ambulatory setting. He is bradycardic from time to time but asymptomatic. Recommend not to change his medications, continue monitoring BPs, pulse. If he has any symptoms he will let me know. See instructions Anxiety: saw psychiatry, on Prozac, doing great. Back pain: Doing OT, feeling great, not taking pain medication at this point. RTC 6 months, CPX

## 2015-02-23 NOTE — Telephone Encounter (Signed)
Patient has already changed his appointment from tomorrow to today. Has question about medication and not comfortable with his pulse although better today.

## 2015-02-23 NOTE — Patient Instructions (Signed)
Get your blood work before you leave    Check the  blood pressure 2 or 3 times a  weekly  Be sure your blood pressure is between 110/65 and  145/85.  if it is consistently higher or lower, let me know  If you have pulse is less than 45 or you have symptoms of slow pulse (weakness, dizziness, severe fatigue): Call the office   Next visit  for a physical  exam in 6 months, fasting.    Please schedule an appointment at the front desk

## 2015-02-23 NOTE — Telephone Encounter (Signed)
Made follow up call to patient states pulse is 61-67 this am after going to [facility]. Appointment scheduled for patient to be seen today.

## 2015-02-24 DIAGNOSIS — Z09 Encounter for follow-up examination after completed treatment for conditions other than malignant neoplasm: Secondary | ICD-10-CM | POA: Insufficient documentation

## 2015-02-24 NOTE — Assessment & Plan Note (Signed)
HTN: BPs consistently normal in the ambulatory setting. He is bradycardic from time to time but asymptomatic. Recommend not to change his medications, continue monitoring BPs, pulse. If he has any symptoms he will let me know. See instructions Anxiety: saw psychiatry, on Prozac, doing great. Back pain: Doing OT, feeling great, not taking pain medication at this point. RTC 6 months, CPX

## 2015-03-07 ENCOUNTER — Ambulatory Visit: Payer: BLUE CROSS/BLUE SHIELD | Admitting: Internal Medicine

## 2015-03-11 DIAGNOSIS — M542 Cervicalgia: Secondary | ICD-10-CM | POA: Diagnosis not present

## 2015-03-11 DIAGNOSIS — M5440 Lumbago with sciatica, unspecified side: Secondary | ICD-10-CM | POA: Diagnosis not present

## 2015-03-11 DIAGNOSIS — M62838 Other muscle spasm: Secondary | ICD-10-CM | POA: Diagnosis not present

## 2015-03-11 DIAGNOSIS — R278 Other lack of coordination: Secondary | ICD-10-CM | POA: Diagnosis not present

## 2015-03-11 DIAGNOSIS — R102 Pelvic and perineal pain: Secondary | ICD-10-CM | POA: Diagnosis not present

## 2015-03-30 ENCOUNTER — Encounter: Payer: Self-pay | Admitting: Podiatry

## 2015-03-30 ENCOUNTER — Ambulatory Visit (INDEPENDENT_AMBULATORY_CARE_PROVIDER_SITE_OTHER): Payer: Commercial Managed Care - HMO | Admitting: Podiatry

## 2015-03-30 DIAGNOSIS — L309 Dermatitis, unspecified: Secondary | ICD-10-CM

## 2015-03-30 MED ORDER — AMMONIUM LACTATE 12 % EX LOTN: 1.0000 "application " | TOPICAL_LOTION | CUTANEOUS | Status: DC | PRN

## 2015-03-30 NOTE — Addendum Note (Signed)
Addended by: Ezzard Flax, Leighann Amadon L on: 03/30/2015 05:07 PM   Modules accepted: Orders, Medications

## 2015-03-30 NOTE — Progress Notes (Signed)
Subjective:     Patient ID: Kirk Sutton, male   DOB: Jul 13, 1950, 65 y.o.   MRN: [id number]  HPI this patient presents to the office stating that he is developing excessive amount of fissuring on the bottom of both feet. He states his heels have always been present and have the worst fissures. He notices he has now developed them in the forefeet of both feet. There is no evidence of any redness, swelling, drainage or infection noted at the sites. He says he has been exercising and it is worsening his condition. He has been previously given Lac-Hydrin to apply to his feet. He presents the office today for an evaluation and treatment of this condition   Review of Systems     Objective:   Physical Exam Physical Exam GENERAL APPEARANCE: Alert, conversant. Appropriately groomed. No acute distress.  VASCULAR: Pedal pulses palpable at [facility] and PT bilateral. Capillary refill time is immediate to all digits, Normal temperature gradient. Digital hair growth is present bilateral  NEUROLOGIC: sensation is normal to 5.07 monofilament at 5/5 sites bilateral. Light touch is intact bilateral, Muscle strength normal.  MUSCULOSKELETAL: acceptable muscle strength, tone and stability bilateral. Intrinsic muscluature intact bilateral. Rectus appearance of foot and digits noted bilateral. Hallux limitus 1st MPJ Right greater than left. Pain noted on palpation through his left forefoot. No redness or swelling or pain on ROM 2-5  DERMATOLOGIC: skin color, texture, and turgor are within normal limits. No preulcerative lesions or ulcers are seen, no interdigital maceration noted. No open lesions present. Digital nails are asymptomatic. No drainage noted.Keratoderma climacticum heels B/L There is fissuring both forefeet.    Assessment:     Dermatitis.     Plan:     ROV  Refill on lac-hydrin.  Use vaseline after showering.  Use chapstick in the fissures themselves.  RTC prn   Gardiner Barefoot DPM

## 2015-04-01 DIAGNOSIS — M5440 Lumbago with sciatica, unspecified side: Secondary | ICD-10-CM | POA: Diagnosis not present

## 2015-04-01 DIAGNOSIS — M6200 Separation of muscle (nontraumatic), unspecified site: Secondary | ICD-10-CM | POA: Diagnosis not present

## 2015-04-01 DIAGNOSIS — R278 Other lack of coordination: Secondary | ICD-10-CM | POA: Diagnosis not present

## 2015-04-01 DIAGNOSIS — M62838 Other muscle spasm: Secondary | ICD-10-CM | POA: Diagnosis not present

## 2015-04-04 ENCOUNTER — Ambulatory Visit (INDEPENDENT_AMBULATORY_CARE_PROVIDER_SITE_OTHER): Payer: Commercial Managed Care - HMO | Admitting: Internal Medicine

## 2015-04-04 ENCOUNTER — Encounter: Payer: Self-pay | Admitting: Internal Medicine

## 2015-04-04 VITALS — BP 132/82 | HR 45 | Temp 98.2°F | Ht 70.0 in | Wt 237.4 lb

## 2015-04-04 DIAGNOSIS — J45901 Unspecified asthma with (acute) exacerbation: Secondary | ICD-10-CM | POA: Diagnosis not present

## 2015-04-04 MED ORDER — PREDNISONE 10 MG PO TABS: 10.0000 mg | ORAL_TABLET | Freq: Every day | ORAL | Status: DC

## 2015-04-04 MED ORDER — HYDROCODONE-HOMATROPINE 5-1.5 MG/5ML PO SYRP: 5.0000 mL | ORAL_SOLUTION | Freq: Every evening | ORAL | Status: DC | PRN

## 2015-04-04 NOTE — Progress Notes (Signed)
Pre visit review using our clinic review tool, if applicable. No additional management support is needed unless otherwise documented below in the visit note. 

## 2015-04-04 NOTE — Progress Notes (Signed)
Subjective:    Patient ID: Kirk Sutton, male    DOB: 09/23/1950, 65 y.o.   MRN: [id number]  DOS:  04/04/2015 Type of visit - description : Acute visit Interval history: Symptoms started 3 days ago with a scratchy throat, low-grade fever, some chills, increased cough and chest congestion. On Mucinex. Using albuterol as needed. She has Qvar but has not been using it bc asthma is usually very good.    Review of Systems  Denies nausea vomiting diarrhea. No myalgias Past Medical History  Diagnosis Date  . Asthma   . Hypertension   . Allergic rhinitis   . Hyperlipidemia   . Chronic prostatitis     sees urology  . Diabetes mellitus     type 11 (a1c 6.0 03/2008)  . Psoriasis     @ hands, sees derm    Past Surgical History  Procedure Laterality Date  . Hernia repair    . Appendectomy    . Fertility surgery  1979  . Testicle surgery      undescended repair   . Shoulder surgery  2008    left  . Nasal septum surgery  2009    Social History   Social History  . Marital Status: Married    Spouse Name: N/A  . Number of Children: 2  . Years of Education: N/A   Occupational History  . Water treatment Materials engineer    Social History Main Topics  . Smoking status: Never Smoker   . Smokeless tobacco: Never Used  . Alcohol Use: Yes     Comment: socially  . Drug Use: No  . Sexual Activity: Not on file   Other Topics Concern  . Not on file   Social History Narrative   Household-- pt, wife, daughter and 4 G-children        Medication List       This list is accurate as of: 04/04/15 11:59 PM.  Always use your most recent med list.               Acai Berry 500 MG Caps  Take 1 capsule by mouth daily.     albuterol 108 (90 Base) MCG/ACT inhaler  Commonly known as:  PROAIR HFA  Inhale 2 puffs into the lungs every 6 (six) hours as needed.     ammonium lactate 12 % lotion  Commonly known as:  AMLACTIN  Apply 1 application topically as needed for  dry skin.     beclomethasone 80 MCG/ACT inhaler  Commonly known as:  QVAR  Inhale 2 puffs into the lungs 2 (two) times daily as needed. Reported on 04/04/2015     dextromethorphan-guaiFENesin 30-600 MG 12hr tablet  Commonly known as:  MUCINEX DM  Take 1 tablet by mouth 2 (two) times daily as needed for cough.     ECOTRIN 325 MG EC tablet  Generic drug:  aspirin  Take 325 mg by mouth every other day. Taking on M and F     fluocinonide cream 0.05 %  Commonly known as:  LIDEX  Apply externally to the affected area twice daily (For hands and feet)     FLUoxetine 40 MG capsule  Commonly known as:  PROZAC  Take 1 tablet daily     fluticasone 50 MCG/ACT nasal spray  Commonly known as:  FLONASE  Place 2 sprays into both nostrils daily.     hydrochlorothiazide 25 MG tablet  Commonly known as:  HYDRODIURIL  Take 1  tablet (25 mg total) by mouth daily.     HYDROcodone-homatropine 5-1.5 MG/5ML syrup  Commonly known as:  HYCODAN  Take 5 mLs by mouth at bedtime as needed for cough.     hydrocortisone 2.5 % ointment  Apply topically 2 (two) times daily.     loratadine 10 MG tablet  Commonly known as:  CLARITIN  Take 10 mg by mouth daily.     metoprolol succinate 50 MG 24 hr tablet  Commonly known as:  TOPROL-XL  Take 1 tablet (50 mg total) by mouth daily.     NON FORMULARY  Isogenics daily.     PAZEO 0.7 % Soln  Generic drug:  Olopatadine HCl  INT 1 GTT IN OU QD     predniSONE 10 MG tablet  Commonly known as:  DELTASONE  Take 1 tablet (10 mg total) by mouth daily. 2 tabs a day x 5 days     PROTEGRA PO  Take by mouth.     terazosin 1 MG capsule  Commonly known as:  HYTRIN  Take 3 capsules (3 mg total) by mouth daily.     traMADol 50 MG tablet  Commonly known as:  ULTRAM  Take 50-100 mg by mouth every 4 (four) hours as needed.     vitamin E 400 UNIT capsule  Generic drug:  vitamin E  Take 400 Units by mouth daily.           Objective:   Physical Exam BP  132/82 mmHg  Pulse 45  Temp(Src) 98.2 F (36.8 C) (Oral)  Ht 5\' 10"  (1.778 m)  Wt 237 lb 6 oz (107.673 kg)  BMI 34.06 kg/m2  SpO2 97% General:   Well developed, well nourished . NAD.  HEENT:  Normocephalic . Face symmetric, atraumatic. TMs: both are bulge, some fluid level on the left, no red or discharge Lungs:  Rhonchi bilaterally, expiratory time increase, few wheezing with cough. Normal respiratory effort, no intercostal retractions, no accessory muscle use. Heart: RRR,  no murmur.  No pretibial edema bilaterally  Skin: Not pale. Not jaundice Neurologic:  alert & oriented X3.  Speech normal, gait appropriate for age and unassisted Psych--  Cognition and judgment appear intact.  Cooperative with normal attention span and concentration.  Behavior appropriate. No anxious or depressed appearing.      Assessment & Plan:   Assessment> Prediabetes  HTN Hyperlipidemia Anxiety Dr Fay Records (psych) Rx prozac ~12-2014  Asthma: used symbicort before (~2015) , now qvar and alb "prn" MSK: Chronic back pain, pain medication as needed, sees Dr Nelva Bush Chronic prostatitis, sees urology Psoriasis, hands, sees dermatology  PLAN: Asthma exacerbation:he has been asymptomatic for a while until the last 3 days,has not been using Qvar. Currently has a URI,   Plan: Mucinex, restart Qvar for 6 weeks, albuterol as needed, cough suppression with hydrocodone (drowsiness discussed ), Flonase. Also small amount of prednisone, if not better will let me know, abx? See instructions RTC as schedule 08-2015

## 2015-04-04 NOTE — Patient Instructions (Addendum)
Rest, fluids , tylenol  For cough:  Take Mucinex DM twice a day as needed until better Hydrocodone at night   For nasal congestion: Use OTC Nasocort or Flonase : 2 nasal sprays on each side of the nose in the morning until you feel better  For asthma: Qvar daily for 6 weeks Albuterol as needed  Prednisone x 5 days    Call if not gradually better over the next  10 days  Call anytime if the symptoms are severe

## 2015-04-07 ENCOUNTER — Telehealth: Payer: Self-pay | Admitting: Internal Medicine

## 2015-04-07 MED ORDER — BECLOMETHASONE DIPROPIONATE 80 MCG/ACT IN AERS: 2.0000 | INHALATION_SPRAY | Freq: Two times a day (BID) | RESPIRATORY_TRACT | Status: DC | PRN

## 2015-04-07 NOTE — Telephone Encounter (Signed)
Rx sent 

## 2015-04-07 NOTE — Telephone Encounter (Signed)
Caller name: Self  Can be reached: [phone]  Pharmacy:  [facility] [id number] - [place], [address] AT [facility] [address] [phone] (Phone) [phone] (Fax)        Reason for call: Request refill on beclomethasone (QVAR) 80 MCG/ACT inhaler [id number]

## 2015-04-11 ENCOUNTER — Telehealth: Payer: Self-pay | Admitting: Internal Medicine

## 2015-04-11 NOTE — Telephone Encounter (Signed)
Patient called stating that he is coughing up blood and has been doing this all weekend but had not been to the ER. Transferred patient to [facility] for advise.

## 2015-04-11 NOTE — Telephone Encounter (Signed)
[facility] - Client TELEPHONE ADVICE RECORD [facility]  Patient Name: Kirk Sutton  DOB: Jan 08, 1951    Initial Comment Caller States he was seen in the office last week, over the weekend started coughing up blood   Nurse Assessment  Nurse: Wynetta Emery, RN, Baker Janus Date/Time Eilene Ghazi Time): 04/11/2015 8:18:34 AM  Confirm and document reason for call. If symptomatic, describe symptoms. You must click the next button to save text entered. ---Doren Custard seen in office on Monday and continues to have constant cough. taking medication he gave him but Saturday coughing so hard he is coughing so hard at times coughing blood up not all the time.  Has the patient traveled out of the country within the last 30 days? ---No  Does the patient have any new or worsening symptoms? ---Yes  Will a triage be completed? ---Yes  Related visit to physician within the last 2 weeks? ---Yes  Does the PT have any chronic conditions? (i.e. diabetes, asthma, etc.) ---No  Is this a behavioral health or substance abuse call? ---No     Guidelines    Guideline Title Affirmed Question Affirmed Notes  Coughing Up Blood Coughing up rusty-colored sputum    Final Disposition User   See Physician within 24 Hours Wynetta Emery, RN, Baker Janus    Comments  NOTE: appt given for 04/12/2015 Kathlene November, MD 245pm C/o rust color sputum at times   Referrals  REFERRED TO PCP OFFICE   Disagree/Comply: Comply

## 2015-04-12 ENCOUNTER — Ambulatory Visit (HOSPITAL_BASED_OUTPATIENT_CLINIC_OR_DEPARTMENT_OTHER)
Admission: RE | Admit: 2015-04-12 | Discharge: 2015-04-12 | Disposition: A | Discharge status: Home / Self Care | Payer: Commercial Managed Care - HMO | Source: Ambulatory Visit | Attending: Internal Medicine | Admitting: Internal Medicine

## 2015-04-12 ENCOUNTER — Ambulatory Visit (INDEPENDENT_AMBULATORY_CARE_PROVIDER_SITE_OTHER): Payer: Commercial Managed Care - HMO | Admitting: Internal Medicine

## 2015-04-12 ENCOUNTER — Encounter: Payer: Self-pay | Admitting: Internal Medicine

## 2015-04-12 VITALS — BP 142/78 | HR 56 | Temp 97.8°F | Ht 70.0 in | Wt 233.0 lb

## 2015-04-12 DIAGNOSIS — J45901 Unspecified asthma with (acute) exacerbation: Secondary | ICD-10-CM | POA: Diagnosis not present

## 2015-04-12 DIAGNOSIS — R042 Hemoptysis: Secondary | ICD-10-CM

## 2015-04-12 DIAGNOSIS — R05 Cough: Secondary | ICD-10-CM | POA: Diagnosis not present

## 2015-04-12 MED ORDER — PREDNISONE 10 MG PO TABS: ORAL_TABLET | ORAL | Status: DC

## 2015-04-12 MED ORDER — HYDROCODONE-HOMATROPINE 5-1.5 MG/5ML PO SYRP: 5.0000 mL | ORAL_SOLUTION | Freq: Every evening | ORAL | Status: DC | PRN

## 2015-04-12 MED ORDER — ALBUTEROL SULFATE HFA 108 (90 BASE) MCG/ACT IN AERS: 2.0000 | INHALATION_SPRAY | Freq: Four times a day (QID) | RESPIRATORY_TRACT | Status: DC | PRN

## 2015-04-12 MED ORDER — AZITHROMYCIN 250 MG PO TABS: ORAL_TABLET | ORAL | Status: DC

## 2015-04-12 NOTE — Patient Instructions (Signed)
Stop by the first floor and get the XR   Continue Qvar  Albuterol as needed for wheezing  Prednisone for a few days  Z-Pak an antibiotic: Start taking it after we call you with the  x-ray report  If you see more blood when you cough: Call the office immediately  Also call if fever, chills, chest pain or if you are not back to normal in 2 weeks.   come back to the office in 6 weeks for a checkup

## 2015-04-12 NOTE — Progress Notes (Signed)
Pre visit review using our clinic review tool, if applicable. No additional management support is needed unless otherwise documented below in the visit note. 

## 2015-04-12 NOTE — Progress Notes (Signed)
Subjective:    Patient ID: Kirk Sutton, male    DOB: 02-20-1951, 65 y.o.   MRN: [id number]  DOS:  04/12/2015 Type of visit - description : Acute  Interval history: Was seen 10 days ago with cough, treated with prednisone and Qvar. Cough is better but not completely gone. He is here because during the weekend he cough up sputum, yellow in color with a very small amount of red blood described as specks. Has not has not seen any of that today.   Review of Systems No fever chills. No runny nose or sore throat Very little sinus congestion No chest pain or difficulty breathing. No recent airplane trips or prolonged car trip. No calf pain or swelling.   Past Medical History  Diagnosis Date  . Asthma   . Hypertension   . Allergic rhinitis   . Hyperlipidemia   . Chronic prostatitis     sees urology  . Diabetes mellitus     type 11 (a1c 6.0 03/2008)  . Psoriasis     @ hands, sees derm    Past Surgical History  Procedure Laterality Date  . Hernia repair    . Appendectomy    . Fertility surgery  1979  . Testicle surgery      undescended repair   . Shoulder surgery  2008    left  . Nasal septum surgery  2009    Social History   Social History  . Marital Status: Married    Spouse Name: N/A  . Number of Children: 2  . Years of Education: N/A   Occupational History  . Water treatment Materials engineer    Social History Main Topics  . Smoking status: Never Smoker   . Smokeless tobacco: Never Used  . Alcohol Use: Yes     Comment: socially  . Drug Use: No  . Sexual Activity: Not on file   Other Topics Concern  . Not on file   Social History Narrative   Household-- pt, wife, daughter and 4 G-children        Medication List       This list is accurate as of: 04/12/15 11:59 PM.  Always use your most recent med list.               Acai Berry 500 MG Caps  Take 1 capsule by mouth daily.     albuterol 108 (90 Base) MCG/ACT inhaler  Commonly known  as:  PROAIR HFA  Inhale 2 puffs into the lungs every 6 (six) hours as needed.     ammonium lactate 12 % lotion  Commonly known as:  AMLACTIN  Apply 1 application topically as needed for dry skin.     azithromycin 250 MG tablet  Commonly known as:  ZITHROMAX Z-PAK  2 tabs a day the first day, then 1 tab a day x 4 days     beclomethasone 80 MCG/ACT inhaler  Commonly known as:  QVAR  Inhale 2 puffs into the lungs 2 (two) times daily as needed.     dextromethorphan-guaiFENesin 30-600 MG 12hr tablet  Commonly known as:  MUCINEX DM  Take 1 tablet by mouth 2 (two) times daily as needed for cough.     ECOTRIN 325 MG EC tablet  Generic drug:  aspirin  Take 325 mg by mouth every other day. Taking on M and F     fluocinonide cream 0.05 %  Commonly known as:  LIDEX  Apply externally to  the affected area twice daily (For hands and feet)     FLUoxetine 40 MG capsule  Commonly known as:  PROZAC  Take 1 tablet daily     fluticasone 50 MCG/ACT nasal spray  Commonly known as:  FLONASE  Place 2 sprays into both nostrils daily.     hydrochlorothiazide 25 MG tablet  Commonly known as:  HYDRODIURIL  Take 1 tablet (25 mg total) by mouth daily.     HYDROcodone-homatropine 5-1.5 MG/5ML syrup  Commonly known as:  HYCODAN  Take 5 mLs by mouth at bedtime as needed for cough.     hydrocortisone 2.5 % ointment  Apply topically 2 (two) times daily. Reported on 04/12/2015     loratadine 10 MG tablet  Commonly known as:  CLARITIN  Take 10 mg by mouth daily.     metoprolol succinate 50 MG 24 hr tablet  Commonly known as:  TOPROL-XL  Take 1 tablet (50 mg total) by mouth daily.     NON FORMULARY  Isogenics daily.     PAZEO 0.7 % Soln  Generic drug:  Olopatadine HCl  INT 1 GTT IN OU QD     predniSONE 10 MG tablet  Commonly known as:  DELTASONE  4 tablets x 2 days, 3 tabs x 2 days, 2 tabs x 2 days, 1 tab x 2 days     PROTEGRA PO  Take by mouth.     terazosin 1 MG capsule  Commonly known  as:  HYTRIN  Take 3 capsules (3 mg total) by mouth daily.     traMADol 50 MG tablet  Commonly known as:  ULTRAM  Take 50-100 mg by mouth every 4 (four) hours as needed.     vitamin E 400 UNIT capsule  Generic drug:  vitamin E  Take 400 Units by mouth daily.           Objective:   Physical Exam BP 142/78 mmHg  Pulse 56  Temp(Src) 97.8 F (36.6 C) (Oral)  Ht 5\' 10"  (1.778 m)  Wt 233 lb (105.688 kg)  BMI 33.43 kg/m2  SpO2 95% General:   Well developed, well nourished . NAD.  HEENT:  Normocephalic . Face symmetric, atraumatic. Neck: No LAD is, no supraclavicular mass Lungs:  Mild amount of rhonchi bilaterally, prolonged expiratory time. No crackles Normal respiratory effort, no intercostal retractions, no accessory muscle use. Heart: RRR,  no murmur.  No pretibial edema bilaterally  Skin: Not pale. Not jaundice Neurologic:  alert & oriented X3.  Speech normal, gait appropriate for age and unassisted Psych--  Cognition and judgment appear intact.  Cooperative with normal attention span and concentration.  Behavior appropriate. No anxious or depressed appearing.      Assessment & Plan:   Assessment> Prediabetes  HTN Hyperlipidemia Anxiety Dr Fay Records (psych) Rx prozac ~12-2014  Asthma: used symbicort before (~2015) , now qvar and alb "prn" MSK: Chronic back pain, pain medication as needed, sees Dr Nelva Bush Chronic prostatitis, sees urology Psoriasis, hands, sees dermatology  PLAN: Hemoptysis, in the setting of bronchitis, asthma: Small amount of hemoptysis for 3 days, now resolving in the context of asthma exacerbation-bronchitis. Patient never been a smoker. Plan: Continue Qvar, encouraged to use albuterol as needed, Z-Pak (if pneumonia, consider Avelox) Chest x-ray Call anytime if he continue having hemoptysis, chest pain, high fever. Call if go back to normal in 2 or 3 weeks. RTC 6 weeks for a checkup

## 2015-04-22 DIAGNOSIS — N411 Chronic prostatitis: Secondary | ICD-10-CM | POA: Diagnosis not present

## 2015-04-22 DIAGNOSIS — Z6832 Body mass index (BMI) 32.0-32.9, adult: Secondary | ICD-10-CM | POA: Diagnosis not present

## 2015-04-22 DIAGNOSIS — N400 Enlarged prostate without lower urinary tract symptoms: Secondary | ICD-10-CM | POA: Diagnosis not present

## 2015-04-29 DIAGNOSIS — M62838 Other muscle spasm: Secondary | ICD-10-CM | POA: Diagnosis not present

## 2015-04-29 DIAGNOSIS — M6200 Separation of muscle (nontraumatic), unspecified site: Secondary | ICD-10-CM | POA: Diagnosis not present

## 2015-04-29 DIAGNOSIS — R102 Pelvic and perineal pain: Secondary | ICD-10-CM | POA: Diagnosis not present

## 2015-04-29 DIAGNOSIS — R278 Other lack of coordination: Secondary | ICD-10-CM | POA: Diagnosis not present

## 2015-05-19 DIAGNOSIS — M5440 Lumbago with sciatica, unspecified side: Secondary | ICD-10-CM | POA: Diagnosis not present

## 2015-05-19 DIAGNOSIS — R278 Other lack of coordination: Secondary | ICD-10-CM | POA: Diagnosis not present

## 2015-05-19 DIAGNOSIS — R102 Pelvic and perineal pain: Secondary | ICD-10-CM | POA: Diagnosis not present

## 2015-05-19 DIAGNOSIS — M62838 Other muscle spasm: Secondary | ICD-10-CM | POA: Diagnosis not present

## 2015-05-19 DIAGNOSIS — M6200 Separation of muscle (nontraumatic), unspecified site: Secondary | ICD-10-CM | POA: Diagnosis not present

## 2015-05-24 NOTE — Telephone Encounter (Signed)
Pre-Visit Call completed. 

## 2015-05-31 ENCOUNTER — Encounter: Payer: Self-pay | Admitting: Internal Medicine

## 2015-05-31 ENCOUNTER — Ambulatory Visit (INDEPENDENT_AMBULATORY_CARE_PROVIDER_SITE_OTHER): Payer: Commercial Managed Care - HMO | Admitting: Internal Medicine

## 2015-05-31 VITALS — BP 122/68 | HR 50 | Temp 97.8°F | Ht 70.0 in | Wt 236.5 lb

## 2015-05-31 DIAGNOSIS — I10 Essential (primary) hypertension: Secondary | ICD-10-CM

## 2015-05-31 DIAGNOSIS — J4521 Mild intermittent asthma with (acute) exacerbation: Secondary | ICD-10-CM

## 2015-05-31 DIAGNOSIS — R042 Hemoptysis: Secondary | ICD-10-CM

## 2015-05-31 DIAGNOSIS — Z09 Encounter for follow-up examination after completed treatment for conditions other than malignant neoplasm: Secondary | ICD-10-CM

## 2015-05-31 DIAGNOSIS — R21 Rash and other nonspecific skin eruption: Secondary | ICD-10-CM

## 2015-05-31 MED ORDER — ALBUTEROL SULFATE HFA 108 (90 BASE) MCG/ACT IN AERS: 2.0000 | INHALATION_SPRAY | Freq: Four times a day (QID) | RESPIRATORY_TRACT | Status: DC | PRN

## 2015-05-31 MED ORDER — FLUTICASONE PROPIONATE (INHAL) 50 MCG/BLIST IN AEPB: 1.0000 | INHALATION_SPRAY | Freq: Two times a day (BID) | RESPIRATORY_TRACT | Status: DC

## 2015-05-31 MED ORDER — METOPROLOL SUCCINATE ER 50 MG PO TB24: 50.0000 mg | ORAL_TABLET | Freq: Every day | ORAL | Status: DC

## 2015-05-31 NOTE — Progress Notes (Signed)
Pre visit review using our clinic review tool, if applicable. No additional management support is needed unless otherwise documented below in the visit note. 

## 2015-05-31 NOTE — Progress Notes (Signed)
Subjective:    Patient ID: Kirk Sutton, male    DOB: 09-03-1950, 65 y.o.   MRN: [id number]  DOS:  05/31/2015 Type of visit - description : f/u  Interval history: Follow-up from previous visit, no further hemoptysis. Also, yesterday he check his BP twice, got 170/100, repeated BP systolic was 99991111. He reports is taking meloxicam pretty frequently for feet pain. Qvar has become very expensive, alternatives ?[name] Kitchen  Review of Systems  Denies fever chills or weight loss No chest pain or difficulty breathing  Past Medical History  Diagnosis Date  . Asthma   . Hypertension   . Allergic rhinitis   . Hyperlipidemia   . Chronic prostatitis     sees urology  . Diabetes mellitus     type 11 (a1c 6.0 03/2008)  . Psoriasis     @ hands, sees derm    Past Surgical History  Procedure Laterality Date  . Hernia repair    . Appendectomy    . Fertility surgery  1979  . Testicle surgery      undescended repair   . Shoulder surgery  2008    left  . Nasal septum surgery  2009    Social History   Social History  . Marital Status: Married    Spouse Name: N/A  . Number of Children: 2  . Years of Education: N/A   Occupational History  . Water treatment Materials engineer    Social History Main Topics  . Smoking status: Never Smoker   . Smokeless tobacco: Never Used  . Alcohol Use: Yes     Comment: socially  . Drug Use: No  . Sexual Activity: Not on file   Other Topics Concern  . Not on file   Social History Narrative   Household-- pt, wife, daughter and 4 G-children        Medication List       This list is accurate as of: 05/31/15  3:16 PM.  Always use your most recent med list.               Acai Berry 500 MG Caps  Take 1 capsule by mouth daily. Reported on 05/31/2015     albuterol 108 (90 Base) MCG/ACT inhaler  Commonly known as:  PROAIR HFA  Inhale 2 puffs into the lungs every 6 (six) hours as needed.     ammonium lactate 12 % lotion  Commonly known  as:  AMLACTIN  Apply 1 application topically as needed for dry skin.     aspirin 81 MG tablet  Take 81 mg by mouth daily.     clobetasol ointment 0.05 %  Commonly known as:  TEMOVATE  Apply 1 application topically 2 (two) times daily. For hands     FLUoxetine 40 MG capsule  Commonly known as:  PROZAC  Take 1 tablet daily     fluticasone 50 MCG/ACT nasal spray  Commonly known as:  FLONASE  Place 2 sprays into both nostrils daily.     fluticasone 50 MCG/BLIST diskus inhaler  Commonly known as:  FLOVENT DISKUS  Inhale 1 puff into the lungs 2 (two) times daily.     hydrochlorothiazide 25 MG tablet  Commonly known as:  HYDRODIURIL  Take 1 tablet (25 mg total) by mouth daily.     hydrocortisone 2.5 % ointment  Apply topically 2 (two) times daily. Reported on 05/31/2015     loratadine 10 MG tablet  Commonly known as:  CLARITIN  Take 10 mg by mouth daily.     meloxicam 15 MG tablet  Commonly known as:  MOBIC  Take 15 mg by mouth daily as needed for pain.     metoprolol succinate 50 MG 24 hr tablet  Commonly known as:  TOPROL-XL  Take 1 tablet (50 mg total) by mouth daily.     NON FORMULARY  Reported on 05/31/2015     PAZEO 0.7 % Soln  Generic drug:  Olopatadine HCl  INT 1 GTT IN OU QD     PROTEGRA PO  Take by mouth.     terazosin 1 MG capsule  Commonly known as:  HYTRIN  Take 3 capsules (3 mg total) by mouth daily.     traMADol 50 MG tablet  Commonly known as:  ULTRAM  Take 50-100 mg by mouth every 4 (four) hours as needed.     vitamin E 400 UNIT capsule  Generic drug:  vitamin E  Take 400 Units by mouth daily.           Objective:   Physical Exam BP 122/68 mmHg  Pulse 50  Temp(Src) 97.8 F (36.6 C) (Oral)  Ht 5\' 10"  (1.778 m)  Wt 236 lb 8 oz (107.276 kg)  BMI 33.93 kg/m2  SpO2 97% General:   Well developed, well nourished . NAD.  HEENT:  Normocephalic . Face symmetric, atraumatic Lungs:  CTA B Normal respiratory effort, no intercostal  retractions, no accessory muscle use. Heart: RRR,  no murmur.  No pretibial edema bilaterally  Skin: Not pale. Not jaundice Neurologic:  alert & oriented X3.  Speech normal, gait appropriate for age and unassisted Psych--  Cognition and judgment appear intact.  Cooperative with normal attention span and concentration.  Behavior appropriate. No anxious or depressed appearing.      Assessment & Plan:   Assessment> Prediabetes  HTN Hyperlipidemia Anxiety Dr Fay Records (psych) Rx prozac ~12-2014  Asthma: used symbicort before (~2015) , now qvar and alb "prn" MSK: Chronic back pain, pain medication as needed, sees Dr Nelva Bush Chronic prostatitis, sees urology Psoriasis, hands, sees dermatology  PLAN: Hemoptysis in the setting of bronchitis: Symptoms completely resolved. Chest x-ray was negative HTN: BP elevated yesterday but very good today, continue HCTZ, Toprol. Refill beta blockers, check BPs, see instructions Asthma: Well controlled, Qvar expensive, try Flovent MSK: Taking meloxicam almost daily, recommend to minimize its use, okay to   use Tylenol as needed Aspirin: Having some capillary fragility with 325 mg, change to 81 mg RTC 08-2015, CPX

## 2015-05-31 NOTE — Patient Instructions (Signed)
  GO TO THE FRONT DESK Schedule your next appointment for a physical exam  When? By [id number]  Fasting?  Yes    ----- Check the  blood pressure 2 or 3 times a   Week  Be sure your blood pressure is between 110/65 and  145/85. If it is consistently higher or lower, let me know  Minimize  the use of meloxicam, okay to take Tylenol  Instead of Qvar I send a prescription for Flovent, if that is not covered by your insurance let us know

## 2015-05-31 NOTE — Assessment & Plan Note (Signed)
Hemoptysis in the setting of bronchitis: Symptoms completely resolved. Chest x-ray was negative HTN: BP elevated yesterday but very good today, continue HCTZ, Toprol. Refill beta blockers, check BPs, see instructions Asthma: Well controlled, Qvar expensive, try Flovent MSK: Taking meloxicam almost daily, recommend to minimize its use, okay to   use Tylenol as needed Aspirin: Having some capillary fragility with 325 mg, change to 81 mg RTC 08-2015, CPX

## 2015-06-01 DIAGNOSIS — F331 Major depressive disorder, recurrent, moderate: Secondary | ICD-10-CM | POA: Diagnosis not present

## 2015-06-02 DIAGNOSIS — M62838 Other muscle spasm: Secondary | ICD-10-CM | POA: Diagnosis not present

## 2015-06-02 DIAGNOSIS — M5440 Lumbago with sciatica, unspecified side: Secondary | ICD-10-CM | POA: Diagnosis not present

## 2015-06-02 DIAGNOSIS — R102 Pelvic and perineal pain: Secondary | ICD-10-CM | POA: Diagnosis not present

## 2015-06-02 DIAGNOSIS — R278 Other lack of coordination: Secondary | ICD-10-CM | POA: Diagnosis not present

## 2015-06-02 DIAGNOSIS — M6200 Separation of muscle (nontraumatic), unspecified site: Secondary | ICD-10-CM | POA: Diagnosis not present

## 2015-06-14 DIAGNOSIS — L2084 Intrinsic (allergic) eczema: Secondary | ICD-10-CM | POA: Diagnosis not present

## 2015-06-22 DIAGNOSIS — M5440 Lumbago with sciatica, unspecified side: Secondary | ICD-10-CM | POA: Diagnosis not present

## 2015-06-22 DIAGNOSIS — M62838 Other muscle spasm: Secondary | ICD-10-CM | POA: Diagnosis not present

## 2015-06-22 DIAGNOSIS — M6200 Separation of muscle (nontraumatic), unspecified site: Secondary | ICD-10-CM | POA: Diagnosis not present

## 2015-06-22 DIAGNOSIS — R278 Other lack of coordination: Secondary | ICD-10-CM | POA: Diagnosis not present

## 2015-06-22 DIAGNOSIS — R102 Pelvic and perineal pain: Secondary | ICD-10-CM | POA: Diagnosis not present

## 2015-07-08 ENCOUNTER — Other Ambulatory Visit: Payer: Self-pay | Admitting: Internal Medicine

## 2015-07-12 ENCOUNTER — Other Ambulatory Visit: Payer: Self-pay | Admitting: Internal Medicine

## 2015-07-12 NOTE — Telephone Encounter (Signed)
Refill sent per [facility] refill protocol/SLS  

## 2015-07-14 DIAGNOSIS — M6200 Separation of muscle (nontraumatic), unspecified site: Secondary | ICD-10-CM | POA: Diagnosis not present

## 2015-07-14 DIAGNOSIS — M62838 Other muscle spasm: Secondary | ICD-10-CM | POA: Diagnosis not present

## 2015-07-14 DIAGNOSIS — M5440 Lumbago with sciatica, unspecified side: Secondary | ICD-10-CM | POA: Diagnosis not present

## 2015-07-14 DIAGNOSIS — R278 Other lack of coordination: Secondary | ICD-10-CM | POA: Diagnosis not present

## 2015-07-27 DIAGNOSIS — M6200 Separation of muscle (nontraumatic), unspecified site: Secondary | ICD-10-CM | POA: Diagnosis not present

## 2015-07-27 DIAGNOSIS — R278 Other lack of coordination: Secondary | ICD-10-CM | POA: Diagnosis not present

## 2015-07-27 DIAGNOSIS — R102 Pelvic and perineal pain: Secondary | ICD-10-CM | POA: Diagnosis not present

## 2015-07-27 DIAGNOSIS — M62838 Other muscle spasm: Secondary | ICD-10-CM | POA: Diagnosis not present

## 2015-08-12 DIAGNOSIS — R102 Pelvic and perineal pain: Secondary | ICD-10-CM | POA: Diagnosis not present

## 2015-08-12 DIAGNOSIS — M6200 Separation of muscle (nontraumatic), unspecified site: Secondary | ICD-10-CM | POA: Diagnosis not present

## 2015-08-12 DIAGNOSIS — M62838 Other muscle spasm: Secondary | ICD-10-CM | POA: Diagnosis not present

## 2015-08-12 DIAGNOSIS — R278 Other lack of coordination: Secondary | ICD-10-CM | POA: Diagnosis not present

## 2015-08-12 DIAGNOSIS — M5440 Lumbago with sciatica, unspecified side: Secondary | ICD-10-CM | POA: Diagnosis not present

## 2015-08-17 ENCOUNTER — Telehealth: Payer: Self-pay

## 2015-08-17 NOTE — Telephone Encounter (Signed)
PT orders reviewed signed and faxed back to [facility] at [phone]. Orders sent for scanning.

## 2015-08-18 DIAGNOSIS — H04123 Dry eye syndrome of bilateral lacrimal glands: Secondary | ICD-10-CM | POA: Diagnosis not present

## 2015-08-18 DIAGNOSIS — H10413 Chronic giant papillary conjunctivitis, bilateral: Secondary | ICD-10-CM | POA: Diagnosis not present

## 2015-08-22 DIAGNOSIS — H04123 Dry eye syndrome of bilateral lacrimal glands: Secondary | ICD-10-CM | POA: Diagnosis not present

## 2015-08-22 DIAGNOSIS — H10413 Chronic giant papillary conjunctivitis, bilateral: Secondary | ICD-10-CM | POA: Diagnosis not present

## 2015-08-24 DIAGNOSIS — F411 Generalized anxiety disorder: Secondary | ICD-10-CM | POA: Diagnosis not present

## 2015-08-24 DIAGNOSIS — R208 Other disturbances of skin sensation: Secondary | ICD-10-CM | POA: Diagnosis not present

## 2015-08-24 DIAGNOSIS — E668 Other obesity: Secondary | ICD-10-CM | POA: Diagnosis not present

## 2015-09-07 ENCOUNTER — Encounter: Payer: Self-pay | Admitting: Internal Medicine

## 2015-09-07 ENCOUNTER — Telehealth: Payer: Self-pay | Admitting: Internal Medicine

## 2015-09-07 ENCOUNTER — Ambulatory Visit (INDEPENDENT_AMBULATORY_CARE_PROVIDER_SITE_OTHER): Payer: Commercial Managed Care - HMO | Admitting: Internal Medicine

## 2015-09-07 VITALS — BP 120/70 | HR 57 | Temp 98.5°F | Ht 70.0 in | Wt 234.4 lb

## 2015-09-07 DIAGNOSIS — Z1211 Encounter for screening for malignant neoplasm of colon: Secondary | ICD-10-CM

## 2015-09-07 DIAGNOSIS — R739 Hyperglycemia, unspecified: Secondary | ICD-10-CM

## 2015-09-07 DIAGNOSIS — Z0000 Encounter for general adult medical examination without abnormal findings: Secondary | ICD-10-CM

## 2015-09-07 LAB — LIPID PANEL
CHOLESTEROL: 132 mg/dL (ref 0–200)
HDL: 40.2 mg/dL (ref >39.00)
LDL CALC: 72 mg/dL (ref 0–99)
NonHDL: 91.53
TRIGLYCERIDES: 98 mg/dL (ref 0.0–149.0)
Total CHOL/HDL Ratio: 3
VLDL: 19.6 mg/dL (ref 0.0–40.0)

## 2015-09-07 LAB — BASIC METABOLIC PANEL
BUN: 23 mg/dL (ref 6–23)
CALCIUM: 9 mg/dL (ref 8.4–10.5)
CHLORIDE: 103 meq/L (ref 96–112)
CO2: 30 mEq/L (ref 19–32)
CREATININE: 1.1 mg/dL (ref 0.40–1.50)
GFR: 70.87 mL/min (ref >60.00)
Glucose, Bld: 112 mg/dL — ABNORMAL HIGH (ref 70–99)
Potassium: 3.5 mEq/L (ref 3.5–5.1)
Sodium: 138 mEq/L (ref 135–145)

## 2015-09-07 LAB — TSH: TSH: 0.94 u[IU]/mL (ref 0.35–4.50)

## 2015-09-07 LAB — HEMOGLOBIN A1C: Hgb A1c MFr Bld: 5.8 % (ref 4.6–6.5)

## 2015-09-07 NOTE — Progress Notes (Signed)
Subjective:    Patient ID: Kirk Sutton, male    DOB: May 14, 1950, 65 y.o.   MRN: [id number]  DOS:  09/07/2015 Type of visit - description : cpx Interval history: Chronic medical issues also discussed    Review of Systems  Constitutional: No fever. No chills. No unexplained wt changes. No unusual sweats  HEENT:  dental problems, no ear discharge, no facial swelling, no voice changes. No eye discharge, no eye  redness , no  intolerance to light   Respiratory: No wheezing , no  difficulty breathing. No cough , no mucus production  Cardiovascular: No CP, no leg swelling , no  Palpitations  GI: no nausea, no vomiting, no diarrhea , no  abdominal pain.  No blood in the stools. No dysphagia, no odynophagia    Endocrine: No polyphagia, no polyuria , no polydipsia  GU: No dysuria, gross hematuria, difficulty urinating. No urinary urgency, no frequency.  Musculoskeletal:  occ pain @ hand joints, no red -swelling  Skin: No change in the color of the skin, palor , no  Rash  Allergic, immunologic: No environmental allergies , no  food allergies  Neurological: No dizziness no  syncope. No headaches. No diplopia, no slurred, no slurred speech, no motor deficits, no facial  Numbness  Hematological: No enlarged lymph nodes, no easy bruising , no unusual bleedings  Psychiatry: No suicidal ideas, no hallucinations, no beavior problems, no confusion.  No unusual/severe anxiety, no depression ` Past Medical History  Diagnosis Date  . Asthma   . Hypertension   . Allergic rhinitis   . Hyperlipidemia   . Chronic prostatitis     sees urology  . Diabetes mellitus     type 11 (a1c 6.0 03/2008)  . Psoriasis     @ hands, sees derm    Past Surgical History  Procedure Laterality Date  . Hernia repair    . Appendectomy    . Fertility surgery  1979  . Testicle surgery      undescended repair   . Shoulder surgery  2008    left  . Nasal septum surgery  2009    Social History   Social  History  . Marital Status: Married    Spouse Name: N/A  . Number of Children: 2  . Years of Education: N/A   Occupational History  . Water treatment Materials engineer    Social History Main Topics  . Smoking status: Never Smoker   . Smokeless tobacco: Never Used  . Alcohol Use: Yes     Comment: socially  . Drug Use: No  . Sexual Activity: Not on file   Other Topics Concern  . Not on file   Social History Narrative   Household-- pt, wife      Family History  Problem Relation Age of Onset  . Diabetes Sister   . Hypertension Mother   . Hypertension Sister   . Heart attack Father     x 3 onset age 22, died at 77  . Colon polyps Mother   . Colon polyps Father   . Colon cancer Neg Hx   . Prostate cancer Neg Hx   . Cancer Father     bladder       Medication List       This list is accurate as of: 09/07/15  5:56 PM.  Always use your most recent med list.               Acai  Berry 500 MG Caps  Take 1 capsule by mouth daily. Reported on 09/07/2015     acetaminophen 500 MG tablet  Commonly known as:  TYLENOL  Take 500 mg by mouth daily as needed for moderate pain.     albuterol 108 (90 Base) MCG/ACT inhaler  Commonly known as:  PROAIR HFA  Inhale 2 puffs into the lungs every 6 (six) hours as needed.     ammonium lactate 12 % lotion  Commonly known as:  AMLACTIN  Apply 1 application topically as needed for dry skin.     aspirin 81 MG tablet  Take 81 mg by mouth daily.     clobetasol ointment 0.05 %  Commonly known as:  TEMOVATE  Apply 1 application topically 2 (two) times daily. For hands     FLUoxetine 40 MG capsule  Commonly known as:  PROZAC  Take 1 tablet daily     fluticasone 50 MCG/ACT nasal spray  Commonly known as:  FLONASE  Place 2 sprays into both nostrils daily.     hydrochlorothiazide 25 MG tablet  Commonly known as:  HYDRODIURIL  TAKE 1 TABLET BY MOUTH DAILY     hydrocortisone 2.5 % ointment  Apply topically 2 (two) times  daily. Reported on 05/31/2015     loratadine 10 MG tablet  Commonly known as:  CLARITIN  Take 10 mg by mouth daily.     metoprolol succinate 50 MG 24 hr tablet  Commonly known as:  TOPROL-XL  Take 1 tablet (50 mg total) by mouth daily.     NON FORMULARY  Reported on 05/31/2015     PAZEO 0.7 % Soln  Generic drug:  Olopatadine HCl  INT 1 GTT IN OU QD     PROTEGRA PO  Take by mouth.     terazosin 1 MG capsule  Commonly known as:  HYTRIN  Take 3 capsules (3 mg total) by mouth daily.     vitamin E 400 UNIT capsule  Generic drug:  vitamin E  Take 400 Units by mouth daily.           Objective:   Physical Exam BP 120/70 mmHg  Pulse 57  Temp(Src) 98.5 F (36.9 C) (Oral)  Ht 5\' 10"  (1.778 m)  Wt 234 lb 6 oz (106.312 kg)  BMI 33.63 kg/m2  SpO2 98%  General:   Well developed, well nourished . NAD.  Neck: No  thyromegaly  HEENT:  Normocephalic . Face symmetric, atraumatic Lungs:  CTA B Normal respiratory effort, no intercostal retractions, no accessory muscle use. Heart: RRR,  no murmur.  No pretibial edema bilaterally  Abdomen:  Not distended, soft, non-tender. No rebound or rigidity.   Skin: Exposed areas without rash. Not pale. Not jaundice Neurologic:  alert & oriented X3.  Speech normal, gait appropriate for age and unassisted Strength symmetric and appropriate for age.  Psych: Cognition and judgment appear intact.  Cooperative with normal attention span and concentration.  Behavior appropriate. No anxious or depressed appearing.    Assessment & Plan:   Assessment> Prediabetes  HTN Hyperlipidemia Anxiety Dr Fay Records (psych) Rx prozac ~12-2014  Asthma: used symbicort before (~2015) , now on alb "prn" MSK: Chronic back pain, pain medication as needed, used to see Dr Nelva Bush, did PT @ [facility] [phone], better Chronic prostatitis, sees urology Psoriasis, hands, sees dermatology  PLAN: Prediabetes: Check A1c HTN: Reports good ambulatory BPs. BP today is  good, no change Hyperlipidemia: Check labs, on no medications Asthma: Currently not an issue,  not using Qvar, on albuterol as needed. MSK: Chronic back pain better after he did physical therapy in the last few months Recently seen with hemoptysis: In the context of bronchitis, no further symptoms RTC 6-8 months

## 2015-09-07 NOTE — Telephone Encounter (Addendum)
Pt previously informed me that he was having easy bruising on hands when taking Aspirin 81 mg daily, I had instructed him to go to every other day to see if he noticed any difference. Pt informed me during OV this AM that bruising had gotten somewhat better but still getting them on his L hand around his knuckles. Please advise.

## 2015-09-07 NOTE — Telephone Encounter (Signed)
°  [phone]  Please call patient and inform him if he needs to decrease his Asprin. States he is having bruising and it was not addressed during his visit.

## 2015-09-07 NOTE — Assessment & Plan Note (Signed)
Prediabetes: Check A1c HTN: Reports good ambulatory BPs. BP today is good, no change Hyperlipidemia: Check labs, on no medications Asthma: Currently not an issue, not using Qvar, on albuterol as needed. MSK: Chronic back pain better after he did physical therapy in the last few months Recently seen with hemoptysis: In the context of bronchitis, no further symptoms RTC 6-8 months

## 2015-09-07 NOTE — Patient Instructions (Signed)
GO TO THE LAB : Get the blood work     GO TO THE FRONT DESK Schedule your next appointment for a  Routine check in 6 to 8 months       Check the  blood pressure 2 or 3 times a   Be sure your blood pressure is between 110/65 and  145/85. If it is consistently higher or lower, let me know

## 2015-09-07 NOTE — Progress Notes (Signed)
Pre visit review using our clinic review tool, if applicable. No additional management support is needed unless otherwise documented below in the visit note. 

## 2015-09-07 NOTE — Assessment & Plan Note (Addendum)
Td 2013;  PNM 23--2015; prevnar-- 2016;   Zostavax 2013;  Colonoscopy Dr Vladimir Faster 2005 (-), overdue for a Cscope, referral sent to [facility] urology yearly    Diet-exercise discussed

## 2015-09-08 NOTE — Telephone Encounter (Addendum)
Advise patient: Few months ago we decrease aspirin 325 mg to 81 mg,  he is now taking 1 tablet every other day and still has bruises on the hand. Recommend to decrease aspirin to twice a week. If he has any open sore or non healing skin sores  >>> refer to derm Also, labs are very good, we are releasing them to [facility]

## 2015-09-08 NOTE — Telephone Encounter (Signed)
Spoke w/ Pt, he is actually taking Aspirin 81 mg every day, instructed him to cut to every other day to see if that helps w/ bruising if not cut to twice weekly. Instructed to call if any open sores, or non healing skin sores/bruises. Informed him of lab results and that they were also released to [place]. Pt verbalized understanding.

## 2015-10-09 ENCOUNTER — Other Ambulatory Visit: Payer: Self-pay | Admitting: Internal Medicine

## 2015-11-05 ENCOUNTER — Other Ambulatory Visit: Payer: Self-pay | Admitting: Podiatry

## 2015-11-11 ENCOUNTER — Ambulatory Visit (INDEPENDENT_AMBULATORY_CARE_PROVIDER_SITE_OTHER): Payer: Commercial Managed Care - HMO | Admitting: *Deleted

## 2015-11-11 ENCOUNTER — Encounter: Payer: Self-pay | Admitting: *Deleted

## 2015-11-11 VITALS — BP 144/80 | HR 52 | Resp 18 | Ht 70.0 in | Wt 233.8 lb

## 2015-11-11 DIAGNOSIS — I10 Essential (primary) hypertension: Secondary | ICD-10-CM

## 2015-11-11 DIAGNOSIS — Z0000 Encounter for general adult medical examination without abnormal findings: Secondary | ICD-10-CM | POA: Diagnosis not present

## 2015-11-11 NOTE — Patient Instructions (Signed)
Continue to eat heart healthy diet (full of fruits, vegetables, whole grains, lean protein, water--limit salt, fat, and sugar intake) and increase physical activity as tolerated.  Continue doing brain stimulating activities (puzzles, reading, adult coloring books, staying active) to keep memory sharp.   Follow-up with Dr. Larose Kells as scheduled. Get your flu shot at next appointment.  Keep a log of your home blood pressures and bring it to your next appointment.  Bring a copy of your advanced directives to your next office visit.

## 2015-11-11 NOTE — Progress Notes (Addendum)
Subjective:   Kirk Sutton is a 65 y.o. male who presents for Medicare Annual/Subsequent preventive examination.  Review of Systems:  No ROS.  Medicare Wellness Visit.  Cardiac Risk Factors include: advanced age (>26men, >33 women);male gender;hypertension  Sleep patterns: Sleeps about 7 hours nightly. No sleep issues. Gets up 1-2x nightly to void.   Home Safety/Smoke Alarms: Lives in single family with home wife, has adult children who moved out recently. No trouble navigating steps. Feels safe in home. Smoke alarms and security system in home.      Living environment; residence and Firearm Safety: Stored in a safe place. Seat Belt Safety/Bike Helmet: Wears seat belt.      Counseling:   Eye Exam- Dr. Bing Plume yearly or as needed. Wears contacts. No issues reported. Next appointment in September. Dental- Sees Dr. Isabelle Course regularly. Is having some dental sensitivity.  Teeth are intermittently "achy." Has seen his dentist and states he discussed w/ Dr. Larose Kells at [facility].   Male:   CCS- last 07/12/03 w/ Dr. Havery Moros, normal. Due for repeat, orders placed by Dr. Larose Kells at [facility] in June. Pt planning to schedule prior to end of calendar year.   PSA- Follows w/ Dr. Thomasene Mohair at [facility] Results  Component Value Date   PSA 0.01 01/24/2009      Objective:    Vitals: BP (!) 144/80   Pulse (!) 52   Resp 18   Ht 5\' 10"  (1.778 m)   Wt 233 lb 12.8 oz (106.1 kg)   SpO2 97%   BMI 33.55 kg/m   Body mass index is 33.55 kg/m.  Tobacco History  Smoking Status  . Never Smoker  Smokeless Tobacco  . Never Used     Counseling given: Not Answered   Past Medical History:  Diagnosis Date  . Allergic rhinitis   . Asthma   . Chronic prostatitis    sees urology  . Diabetes mellitus    type 11 (a1c 6.0 03/2008)  . Hyperlipidemia   . Hypertension   . Psoriasis    @ hands, sees derm   Past Surgical History:  Procedure Laterality Date  . APPENDECTOMY    . FERTILITY SURGERY  1979  . HERNIA REPAIR     . NASAL SEPTUM SURGERY  2009  . SHOULDER SURGERY  2008   left  . TESTICLE SURGERY     undescended repair    Family History  Problem Relation Age of Onset  . Diabetes Sister   . Hypertension Mother   . Colon polyps Mother   . Hypertension Sister   . Heart attack Father     x 3 onset age 63, died at 60  . Colon polyps Father   . Cancer Father     bladder  . Colon cancer Neg Hx   . Prostate cancer Neg Hx    History  Sexual Activity  . Sexual activity: Not on file    Outpatient Encounter Prescriptions as of 11/11/2015  Medication Sig  . acetaminophen (TYLENOL) 500 MG tablet Take 500 mg by mouth daily as needed for moderate pain.  [name] Kitchen albuterol (PROAIR HFA) 108 (90 Base) MCG/ACT inhaler Inhale 2 puffs into the lungs every 6 (six) hours as needed.  [name] Kitchen ammonium lactate (AMLACTIN) 12 % lotion Apply 1 application topically as needed for dry skin.  [name] Kitchen aspirin 81 MG tablet Take 81 mg by mouth once a week.   . clobetasol ointment (TEMOVATE) [id number]-[id number] % Apply 1 application topically 2 (two) times  daily. For hands  . dextromethorphan-guaiFENesin (MUCINEX DM) 30-600 MG 12hr tablet Take 1 tablet by mouth daily as needed for cough.  [name] Kitchen FLUoxetine (PROZAC) 40 MG capsule Take 1 tablet daily  . hydrochlorothiazide (HYDRODIURIL) 25 MG tablet Take 1 tablet (25 mg total) by mouth daily.  [name] Kitchen loratadine (CLARITIN) 10 MG tablet Take 10 mg by mouth daily.    . Menthol, Topical Analgesic, (BIOFREEZE EX) Apply topically.  . metoprolol succinate (TOPROL-XL) 50 MG 24 hr tablet Take 1 tablet (50 mg total) by mouth daily.  . Multiple Vitamins-Minerals (PROTEGRA PO) Take by mouth.    [name] Kitchen PAZEO 0.7 % SOLN INT 1 GTT IN OU QD  . terazosin (HYTRIN) 1 MG capsule Take 3 capsules (3 mg total) by mouth daily.  . vitamin E (VITAMIN E) 400 UNIT capsule Take 400 Units by mouth daily.    . [DISCONTINUED] Acai Berry 500 MG CAPS Take 1 capsule by mouth daily. Reported on 09/07/2015  . [DISCONTINUED] ammonium lactate (AMLACTIN) 12 %  cream APPLY TO THE AFFECTED AREA AS DIRECTED (Patient not taking: Reported on 11/11/2015)  . [DISCONTINUED] fluticasone (FLONASE) 50 MCG/ACT nasal spray Place 2 sprays into both nostrils daily. (Patient not taking: Reported on 09/07/2015)  . [DISCONTINUED] hydrocortisone 2.5 % ointment Apply topically 2 (two) times daily. Reported on 05/31/2015  . [DISCONTINUED] NON FORMULARY Reported on 05/31/2015   No facility-administered encounter medications on file as of 11/11/2015.     Activities of Daily Living In your present state of health, do you have any difficulty performing the following activities: 11/11/2015 09/07/2015  Hearing? N N  Vision? N N  Difficulty concentrating or making decisions? N N  Walking or climbing stairs? N N  Dressing or bathing? N N  Doing errands, shopping? N N  Preparing Food and eating ? N -  Using the Toilet? N -  In the past six months, have you accidently leaked urine? N -  Do you have problems with loss of bowel control? N -  Managing your Medications? N -  Managing your Finances? N -  Housekeeping or managing your Housekeeping? N -  Some recent data might be hidden    Patient Care Team: Colon Branch, MD as PCP - General Kerry Fort, MD as Consulting Physician (Urology) Calvert Cantor, MD as Consulting Physician (Ophthalmology) Orbie Hurst as Consulting Physician (Dermatology) Gardiner Barefoot, DPM as Consulting Physician (Podiatry)   Assessment:    Physical assessment deferred to PCP.  Exercise Activities and Dietary recommendations Current Exercise Habits: Home exercise routine, Type of exercise: walking;yoga, Time (Minutes): 40, Frequency (Times/Week): 3, Weekly Exercise (Minutes/Week): 120, Exercise limited by: None identified  Diet (meal preparation, eat out, water intake, caffeinated beverages, dairy products, fruits and vegetables): Drinks water and greens tea/unsweetened tea. Rarely drinks soda. Has cut back on coffee intake. Is trying to lose  weight by cutting carbs and concentrated sweets and practicing good portion control. Breakfast: Frosted Mini Wheats Lunch: Trying to eat vegetables.  Dinner: Usually eats at home. Maybe sandwich on whole wheat bread.       Goals    . Increase physical activity    . Weight (lb) < 215 lb (97.5 kg)      Fall Risk Fall Risk  11/11/2015 09/07/2015 09/03/2014 09/01/2013 05/06/2013  Falls in the past year? No No No No No   Depression Screen PHQ 2/9 Scores 11/11/2015 09/07/2015 09/03/2014 09/01/2013  PHQ - 2 Score 0 0 0 0    Cognitive Testing  MMSE - Mini Mental State Exam 11/11/2015  Orientation to time 5  Orientation to Place 5  Registration 3  Attention/ Calculation 5  Recall 3  Language- name 2 objects 2  Language- repeat 1  Language- follow 3 step command 3  Language- read & follow direction 1  Write a sentence 1  Copy design 1  Total score 30    Immunization History  Administered Date(s) Administered  . H1N1 03/31/2008  . Influenza Split 01/22/2011, 01/10/2012  . Influenza Whole 01/17/2007, 12/24/2007, 11/17/2009  . Influenza, High Dose Seasonal PF 02/23/2015  . Influenza,inj,Quad PF,36+ Mos 01/20/2013, 03/08/2014  . Pneumococcal Conjugate-13 09/03/2014  . Pneumococcal Polysaccharide-23 09/01/2013  . Td 03/19/2001  . Tdap 08/22/2011  . Zoster 08/22/2011   Screening Tests Health Maintenance  Topic Date Due  . OPHTHALMOLOGY EXAM  10/17/2009  . FOOT EXAM  02/16/2010  . URINE MICROALBUMIN  07/20/2011  . INFLUENZA VACCINE  10/18/2015  . HEMOGLOBIN A1C  03/08/2016  . COLONOSCOPY  03/19/2017  . TETANUS/TDAP  08/21/2021  . ZOSTAVAX  Completed  . Hepatitis C Screening  Completed  . PNA vac Low Risk Adult  Completed      Plan:    Continue to eat heart healthy diet (full of fruits, vegetables, whole grains, lean protein, water--limit salt, fat, and sugar intake) and increase physical activity as tolerated.  Continue doing brain stimulating activities (puzzles, reading, adult  coloring books, staying active) to keep memory sharp.   Follow-up with Dr. Larose Kells as scheduled. Get your flu shot at next appointment.  Keep a log of your home blood pressures and bring it to your next appointment.  Bring a copy of your advanced directives to your next office visit.  During the course of the visit the patient was educated and counseled about the following appropriate screening and preventive services:   Vaccines to include Pneumoccal, Influenza, Hepatitis B, Td, Zostavax, HCV  Cardiovascular Disease  Colorectal cancer screening  Diabetes screening  Prostate Cancer Screening  Glaucoma screening  Nutrition counseling   Patient Instructions (the written plan) was given to the patient.    Dorrene German, RN  11/11/2015  Kathlene November, MD

## 2015-11-11 NOTE — Assessment & Plan Note (Signed)
BP elevated today. Pt is asymptomatic and reports compliance w/ prescribed medications. Has already taken morning meds today. Checks BP regularly at home and reports it is normally 130s/70s. Reports he did not sleep well last night due to his granddaughter spending the night and feels this might be contributing to today's reading. Follow-up appt scheduled w/ PCP.   BP Readings from Last 3 Encounters:  11/11/15 (!) 144/80  09/07/15 120/70  05/31/15 122/68

## 2015-11-11 NOTE — Progress Notes (Signed)
Pre visit review using our clinic review tool, if applicable. No additional management support is needed unless otherwise documented below in the visit note. 

## 2015-11-14 ENCOUNTER — Telehealth: Payer: Self-pay | Admitting: Gastroenterology

## 2015-11-14 ENCOUNTER — Encounter: Payer: Self-pay | Admitting: Internal Medicine

## 2015-11-14 NOTE — Telephone Encounter (Signed)
COLON REPORT REVIEWED BY DR. Havery Moros AND APPROVED SPOKE W/PT--WANTS [place] TO CALL BACK IN A MONTH TO SCH'D         We have contacted patient several times. Patiens states that he will callback to schedule.

## 2015-11-16 ENCOUNTER — Telehealth: Payer: Self-pay | Admitting: Internal Medicine

## 2015-11-16 DIAGNOSIS — M79644 Pain in right finger(s): Secondary | ICD-10-CM

## 2015-11-16 DIAGNOSIS — M79645 Pain in left finger(s): Secondary | ICD-10-CM

## 2015-11-16 DIAGNOSIS — J3489 Other specified disorders of nose and nasal sinuses: Secondary | ICD-10-CM

## 2015-11-16 NOTE — Telephone Encounter (Signed)
Caller name:Quron Rosenkranz  Relationship to patient:self Can be reached:[phone] Pharmacy:  Reason for call:Requesting referral to ENT, sinus. And referral for hand pain, both thumbs, [facility]

## 2015-11-16 NOTE — Telephone Encounter (Signed)
Okay to place referrals? 

## 2015-11-16 NOTE — Telephone Encounter (Signed)
Okay referrals if so desired however offer him a visit, it may be something simple that I can help him with.

## 2015-11-16 NOTE — Telephone Encounter (Signed)
Spoke w/ Pt, he informed me that he does have a BP follow-up w/ PCP on 9/18 and is needing more immediate relief. Informed him PCP will be out of office for the majority of next week and informed him that I will place ENT and hand specialist/ortho referral and to keep scheduled follow-up w/ PCP. Pt verbalized understanding.

## 2015-11-24 DIAGNOSIS — M79642 Pain in left hand: Secondary | ICD-10-CM | POA: Diagnosis not present

## 2015-11-24 DIAGNOSIS — M180 Bilateral primary osteoarthritis of first carpometacarpal joints: Secondary | ICD-10-CM | POA: Diagnosis not present

## 2015-11-24 DIAGNOSIS — M79641 Pain in right hand: Secondary | ICD-10-CM | POA: Diagnosis not present

## 2015-11-29 DIAGNOSIS — F331 Major depressive disorder, recurrent, moderate: Secondary | ICD-10-CM | POA: Diagnosis not present

## 2015-12-01 DIAGNOSIS — K1379 Other lesions of oral mucosa: Secondary | ICD-10-CM | POA: Diagnosis not present

## 2015-12-01 DIAGNOSIS — J309 Allergic rhinitis, unspecified: Secondary | ICD-10-CM | POA: Diagnosis not present

## 2015-12-05 ENCOUNTER — Ambulatory Visit (INDEPENDENT_AMBULATORY_CARE_PROVIDER_SITE_OTHER): Payer: Commercial Managed Care - HMO | Admitting: Internal Medicine

## 2015-12-05 ENCOUNTER — Encounter: Payer: Self-pay | Admitting: Internal Medicine

## 2015-12-05 VITALS — BP 120/78 | HR 78 | Temp 98.1°F | Resp 14 | Ht 70.0 in | Wt 232.1 lb

## 2015-12-05 DIAGNOSIS — J4521 Mild intermittent asthma with (acute) exacerbation: Secondary | ICD-10-CM | POA: Diagnosis not present

## 2015-12-05 DIAGNOSIS — R7303 Prediabetes: Secondary | ICD-10-CM | POA: Diagnosis not present

## 2015-12-05 DIAGNOSIS — I10 Essential (primary) hypertension: Secondary | ICD-10-CM

## 2015-12-05 NOTE — Patient Instructions (Signed)
GO TO THE FRONT DESK Schedule your next appointment for a  Check up in 6 months   Check the  blood pressure 2 or 3 times a month  Be sure your blood pressure is between 110/65 and  145/85. If it is consistently higher or lower, let me know

## 2015-12-05 NOTE — Progress Notes (Signed)
Pre visit review using our clinic review tool, if applicable. No additional management support is needed unless otherwise documented below in the visit note. 

## 2015-12-05 NOTE — Progress Notes (Signed)
Subjective:    Patient ID: Kirk Sutton, male    DOB: April 29, 1950, 65 y.o.   MRN: [id number]  DOS:  12/05/2015 Type of visit - description : rov Interval history: Prediabetes: Doing well with diet and exercise. Has problems with mouth pain, saw ENT few days ago, no etiology found, to see his dentist soon. Asthma  doing okay until 3 days ago when he developed cough and wheezing. Feeling slightly better with albuterol and Mucinex HTN- good medication compliance, BP today is great  Labs reviewed, not due for any blood work  Review of Systems   denies chest pain or shortness of breath No fever, mild chills?. No sputum production   Past Medical History:  Diagnosis Date  . Allergic rhinitis   . Asthma   . Chronic prostatitis    sees urology  . Diabetes mellitus    type 11 (a1c 6.0 03/2008)  . Hyperlipidemia   . Hypertension   . Psoriasis    @ hands, sees derm    Past Surgical History:  Procedure Laterality Date  . APPENDECTOMY    . FERTILITY SURGERY  1979  . HERNIA REPAIR    . NASAL SEPTUM SURGERY  2009  . SHOULDER SURGERY  2008   left  . TESTICLE SURGERY     undescended repair     Social History   Social History  . Marital status: Married    Spouse name: N/A  . Number of children: 2  . Years of education: N/A   Occupational History  . Water treatment Materials engineer    Social History Main Topics  . Smoking status: Never Smoker  . Smokeless tobacco: Never Used  . Alcohol use Yes     Comment: socially, rare  . Drug use: No  . Sexual activity: Not on file   Other Topics Concern  . Not on file   Social History Narrative   Household-- pt, wife         Medication List       Accurate as of 12/05/15  9:02 AM. Always use your most recent med list.          acetaminophen 500 MG tablet Commonly known as:  TYLENOL Take 500 mg by mouth daily as needed for moderate pain.   albuterol 108 (90 Base) MCG/ACT inhaler Commonly known as:  PROAIR  HFA Inhale 2 puffs into the lungs every 6 (six) hours as needed.   ammonium lactate 12 % lotion Commonly known as:  AMLACTIN Apply 1 application topically as needed for dry skin.   aspirin 81 MG tablet Take 81 mg by mouth once a week.   BIOFREEZE EX Apply topically.   clobetasol ointment 0.05 % Commonly known as:  TEMOVATE Apply 1 application topically 2 (two) times daily. For hands   dextromethorphan-guaiFENesin 30-600 MG 12hr tablet Commonly known as:  MUCINEX DM Take 1 tablet by mouth daily as needed for cough.   FLONASE SENSIMIST NA Place into the nose daily as needed.   FLUoxetine 40 MG capsule Commonly known as:  PROZAC Take 1 tablet daily   hydrochlorothiazide 25 MG tablet Commonly known as:  HYDRODIURIL Take 1 tablet (25 mg total) by mouth daily.   loratadine 10 MG tablet Commonly known as:  CLARITIN Take 10 mg by mouth daily.   metoprolol succinate 50 MG 24 hr tablet Commonly known as:  TOPROL-XL Take 1 tablet (50 mg total) by mouth daily.   PAZEO 0.7 % Soln Generic  drug:  Olopatadine HCl INT 1 GTT IN OU QD   PROTEGRA PO Take by mouth.   pyridOXINE 100 MG tablet Commonly known as:  VITAMIN B-6 Take 100 mg by mouth daily.   terazosin 1 MG capsule Commonly known as:  HYTRIN Take 3 capsules (3 mg total) by mouth daily.   vitamin E 400 UNIT capsule Generic drug:  vitamin E Take 400 Units by mouth daily.          Objective:   Physical Exam BP 120/78 (BP Location: Left Arm, Patient Position: Sitting, Cuff Size: Normal)   Pulse 78   Temp 98.1 F (36.7 C) (Oral)   Resp 14   Ht 5\' 10"  (1.778 m)   Wt 232 lb 2 oz (105.3 kg)   SpO2 93%   BMI 33.31 kg/m  General:   Well developed, well nourished . NAD.  HEENT:  Normocephalic . Face symmetric, atraumatic. TMs normal, throat symmetric, no red. Sinuses no TTP Lungs:  Few rhonchi with cough and prolonged expiratory time but no crackles. No actual wheezing Normal respiratory effort, no  intercostal retractions, no accessory muscle use. Heart: RRR,  no murmur.  No pretibial edema bilaterally  Skin: Not pale. Not jaundice Neurologic:  alert & oriented X3.  Speech normal, gait appropriate for age and unassisted Psych--  Cognition and judgment appear intact.  Cooperative with normal attention span and concentration.  Behavior appropriate. No anxious or depressed appearing.      Assessment & Plan:    Assessment> Prediabetes  HTN Hyperlipidemia Anxiety Dr Fay Records (psych) Rx prozac ~12-2014  Asthma: used symbicort before (~2015) , now on alb "prn" MSK:  --Chronic back pain, pain medication as needed, used to see Dr Nelva Bush, did PT @ [facility] [phone], better --DJD- hands Chronic prostatitis, sees urology Psoriasis, hands, sees dermatology  PLAN: Prediabetes: Doing well with diet and exercise, last A1c satisfactory HTN: BP today is great, no change. Asthma: Well controlled except for the last 3 days, has an intercurrent bronchitis. Continue albuterol, Mucinex, call if not better. To get a flu shot as soon as he is better DJD: Had a local injection at the base of the thumb at orthopedic surgery, slightly better. Follow-up as scheduled by 03-2016

## 2015-12-05 NOTE — Assessment & Plan Note (Signed)
Prediabetes: Doing well with diet and exercise, last A1c satisfactory HTN: BP today is great, no change. Asthma: Well controlled except for the last 3 days, has an intercurrent bronchitis. Continue albuterol, Mucinex, call if not better. To get a flu shot as soon as he is better DJD: Had a local injection at the base of the thumb at orthopedic surgery, slightly better. Follow-up as scheduled by 03-2016

## 2015-12-15 DIAGNOSIS — H04123 Dry eye syndrome of bilateral lacrimal glands: Secondary | ICD-10-CM | POA: Diagnosis not present

## 2015-12-15 DIAGNOSIS — H35033 Hypertensive retinopathy, bilateral: Secondary | ICD-10-CM | POA: Diagnosis not present

## 2015-12-15 DIAGNOSIS — H10413 Chronic giant papillary conjunctivitis, bilateral: Secondary | ICD-10-CM | POA: Diagnosis not present

## 2015-12-15 DIAGNOSIS — H5213 Myopia, bilateral: Secondary | ICD-10-CM | POA: Diagnosis not present

## 2015-12-15 DIAGNOSIS — H524 Presbyopia: Secondary | ICD-10-CM | POA: Diagnosis not present

## 2015-12-15 DIAGNOSIS — H52223 Regular astigmatism, bilateral: Secondary | ICD-10-CM | POA: Diagnosis not present

## 2015-12-15 DIAGNOSIS — H2513 Age-related nuclear cataract, bilateral: Secondary | ICD-10-CM | POA: Diagnosis not present

## 2015-12-29 ENCOUNTER — Encounter: Payer: Self-pay | Admitting: Medical

## 2015-12-29 ENCOUNTER — Ambulatory Visit (INDEPENDENT_AMBULATORY_CARE_PROVIDER_SITE_OTHER): Payer: Commercial Managed Care - HMO | Admitting: Medical

## 2015-12-29 VITALS — BP 131/80 | HR 57 | Temp 98.6°F | Ht 70.0 in | Wt 235.0 lb

## 2015-12-29 DIAGNOSIS — J309 Allergic rhinitis, unspecified: Secondary | ICD-10-CM

## 2015-12-29 DIAGNOSIS — H6981 Other specified disorders of Eustachian tube, right ear: Secondary | ICD-10-CM | POA: Diagnosis not present

## 2015-12-29 NOTE — Progress Notes (Signed)
Pre visit review using our clinic tool,if applicable. No additional management support is needed unless otherwise documented below in the visit note.  

## 2015-12-29 NOTE — Patient Instructions (Addendum)
You appear to have allergic rhinitis with eustachian tube dysfunction. I want you to continue your flonase and claritin. Before your flights coming up would recommend use of afrin. But no more than 2 days use.   I don't see any active infection in ears and your sinuses  don't appear infected presently.   If you have problems over the week end. Please update [place] on Monday.  Follow up in 7 days or as needed

## 2015-12-29 NOTE — Progress Notes (Signed)
Subjective:    Patient ID: Kirk Sutton, male    DOB: [birth year], 65 y.o.   MRN: [id number]  HPI  Pt states 3 days ago had some rt ear pressure recently. Tuesday was worse. Then states yesterday ear pressure cleared. Pt feels some mild nasal congestion.   Pt has been feeling some pnd on and off.   Pt is about fly to [facility] tomorrow. Pt will be visiting grandson this weekend. Then come back on Monday.   Pt state history of some allergies in fall. Pt is using flonase in morning. Also using claritin.  Pt has some proair yesterday. Pt has not been wheezing. No chest congestion reported.   Pt not having productive cough. Cough other day after eating spicy food.   Pt has been using mouth guard and slight sore gum.  Expresses concern that he may get sick while traveling. So wanted to get checked out before.   Review of Systems  Constitutional: Negative for chills, fatigue and fever.  HENT: Positive for congestion and postnasal drip. Negative for ear pain, rhinorrhea, sinus pressure, sneezing, sore throat and trouble swallowing.   Respiratory: Positive for cough. Negative for choking, shortness of breath and wheezing.   Cardiovascular: Negative for chest pain and palpitations.  Gastrointestinal: Negative for abdominal pain.  Musculoskeletal: Negative for back pain, myalgias and neck stiffness.   Past Medical History:  Diagnosis Date  . Allergic rhinitis   . Asthma   . Chronic prostatitis    sees urology  . Diabetes mellitus    type 11 (a1c 6.0 03/2008)  . Hyperlipidemia   . Hypertension   . Psoriasis    @ hands, sees derm     Social History   Social History  . Marital status: Married    Spouse name: N/A  . Number of children: 2  . Years of education: N/A   Occupational History  . Water treatment Materials engineer    Social History Main Topics  . Smoking status: Never Smoker  . Smokeless tobacco: Never Used  . Alcohol use Yes     Comment: socially, rare    . Drug use: No  . Sexual activity: Not on file   Other Topics Concern  . Not on file   Social History Narrative   Household-- pt, wife     Past Surgical History:  Procedure Laterality Date  . APPENDECTOMY    . FERTILITY SURGERY  1979  . HERNIA REPAIR    . NASAL SEPTUM SURGERY  2009  . SHOULDER SURGERY  2008   left  . TESTICLE SURGERY     undescended repair     Family History  Problem Relation Age of Onset  . Diabetes Sister   . Hypertension Mother   . Colon polyps Mother   . Hypertension Sister   . Heart attack Father     x 3 onset age 8, died at 32  . Colon polyps Father   . Cancer Father     bladder  . Colon cancer Neg Hx   . Prostate cancer Neg Hx     Allergies  Allergen Reactions  . Cetirizine Hcl     REACTION: prostatitis  . Fexofenadine     REACTION: causes prostatitis    Current Outpatient Prescriptions on File Prior to Visit  Medication Sig Dispense Refill  . acetaminophen (TYLENOL) 500 MG tablet Take 500 mg by mouth daily as needed for moderate pain.    [name] Kitchen albuterol (PROAIR HFA)  108 (90 Base) MCG/ACT inhaler Inhale 2 puffs into the lungs every 6 (six) hours as needed. 8.5 g 3  . ammonium lactate (AMLACTIN) 12 % lotion Apply 1 application topically as needed for dry skin. 400 g 1  . aspirin 81 MG tablet Take 81 mg by mouth once a week.     . clobetasol ointment (TEMOVATE) [id number] % Apply 1 application topically 2 (two) times daily. For hands    . dextromethorphan-guaiFENesin (MUCINEX DM) 30-600 MG 12hr tablet Take 1 tablet by mouth daily as needed for cough.    [name] Kitchen FLUoxetine (PROZAC) 40 MG capsule Take 1 tablet daily  2  . Fluticasone Furoate (FLONASE SENSIMIST NA) Place into the nose daily as needed.    . hydrochlorothiazide (HYDRODIURIL) 25 MG tablet Take 1 tablet (25 mg total) by mouth daily. 90 tablet 2  . loratadine (CLARITIN) 10 MG tablet Take 10 mg by mouth daily.      . Menthol, Topical Analgesic, (BIOFREEZE EX) Apply topically.    . metoprolol  succinate (TOPROL-XL) 50 MG 24 hr tablet Take 1 tablet (50 mg total) by mouth daily. 90 tablet 2  . Multiple Vitamins-Minerals (PROTEGRA PO) Take by mouth.      [name] Kitchen PAZEO 0.7 % SOLN INT 1 GTT IN OU QD  3  . pyridOXINE (VITAMIN B-6) 100 MG tablet Take 100 mg by mouth daily.    [name] Kitchen terazosin (HYTRIN) 1 MG capsule Take 3 capsules (3 mg total) by mouth daily. 270 capsule 2  . vitamin E (VITAMIN E) 400 UNIT capsule Take 400 Units by mouth daily.       No current facility-administered medications on file prior to visit.     BP 131/80   Pulse (!) 57   Temp 98.6 F (37 C) (Oral)   Ht 5\' 10"  (1.778 m)   Wt 235 lb (106.6 kg)   SpO2 97%   BMI 33.72 kg/m       Objective:   Physical Exam  General  Mental Status - Alert. General Appearance - Well groomed. Not in acute distress.  Skin Rashes- No Rashes.  HEENT Head- Normal. Ear Auditory Canal - Left- Normal. Right - Normal.Tympanic Membrane- Left- Normal. Right- Normal. Eye Sclera/Conjunctiva- Left- Normal. Right- Normal. Nose & Sinuses Nasal Mucosa- Left-  Boggy and Congested. Right-  Boggy and  Congested.Bilateral no  maxillary and  No frontal sinus pressure. Mouth & Throat Lips: Upper Lip- Normal: no dryness, cracking, pallor, cyanosis, or vesicular eruption. Lower Lip-Normal: no dryness, cracking, pallor, cyanosis or vesicular eruption. Buccal Mucosa- Bilateral- No Aphthous ulcers. Oropharynx- No Discharge or Erythema. Tonsils: Characteristics- Bilateral- No Erythema or Congestion. Size/Enlargement- Bilateral- No enlargement. Discharge- bilateral-None.  Neck Neck- Supple. No Masses.   Chest and Lung Exam Auscultation: Breath Sounds:-Clear even and unlabored.  Cardiovascular Auscultation:Rythm- Regular, rate and rhythm. Murmurs & Other Heart Sounds:Ausculatation of the heart reveal- No Murmurs.  Lymphatic Head & Neck General Head & Neck Lymphatics: Bilateral: Description- No Localized lymphadenopathy.      Assessment &  Plan:  You appear to have allergic rhinitis with eustachian tube dysfunction. I want you to continue your flonase and claritin. Before your flights coming up would recommend use of afrin. But no more than 2 days use.   I don't see any active infection in ears and your sinuses don't appear infected presently.   If you have problems over the week end. Please update [place] on Monday.  Follow up in 7 days or as needed  [place],  Percell Miller, PA-C

## 2016-01-13 ENCOUNTER — Ambulatory Visit (INDEPENDENT_AMBULATORY_CARE_PROVIDER_SITE_OTHER): Payer: Commercial Managed Care - HMO | Admitting: Behavioral Health

## 2016-01-13 DIAGNOSIS — Z23 Encounter for immunization: Secondary | ICD-10-CM | POA: Diagnosis not present

## 2016-01-13 DIAGNOSIS — M1812 Unilateral primary osteoarthritis of first carpometacarpal joint, left hand: Secondary | ICD-10-CM | POA: Diagnosis not present

## 2016-01-13 DIAGNOSIS — M1811 Unilateral primary osteoarthritis of first carpometacarpal joint, right hand: Secondary | ICD-10-CM | POA: Diagnosis not present

## 2016-02-20 DIAGNOSIS — N400 Enlarged prostate without lower urinary tract symptoms: Secondary | ICD-10-CM | POA: Diagnosis not present

## 2016-02-23 ENCOUNTER — Other Ambulatory Visit: Payer: Self-pay

## 2016-02-23 MED ORDER — METOPROLOL SUCCINATE ER 50 MG PO TB24: 50.0000 mg | ORAL_TABLET | Freq: Every day | ORAL | 1 refills | Status: DC

## 2016-02-28 DIAGNOSIS — N400 Enlarged prostate without lower urinary tract symptoms: Secondary | ICD-10-CM | POA: Diagnosis not present

## 2016-03-06 DIAGNOSIS — L821 Other seborrheic keratosis: Secondary | ICD-10-CM | POA: Diagnosis not present

## 2016-03-06 DIAGNOSIS — L2084 Intrinsic (allergic) eczema: Secondary | ICD-10-CM | POA: Diagnosis not present

## 2016-03-06 DIAGNOSIS — D225 Melanocytic nevi of trunk: Secondary | ICD-10-CM | POA: Diagnosis not present

## 2016-03-20 ENCOUNTER — Ambulatory Visit (INDEPENDENT_AMBULATORY_CARE_PROVIDER_SITE_OTHER): Payer: Medicare HMO | Admitting: Internal Medicine

## 2016-03-20 ENCOUNTER — Encounter: Payer: Self-pay | Admitting: Internal Medicine

## 2016-03-20 VITALS — BP 122/76 | HR 66 | Temp 98.1°F | Resp 14 | Ht 70.0 in | Wt 230.5 lb

## 2016-03-20 DIAGNOSIS — J0100 Acute maxillary sinusitis, unspecified: Secondary | ICD-10-CM

## 2016-03-20 MED ORDER — BENZONATATE 200 MG PO CAPS: 200.0000 mg | ORAL_CAPSULE | Freq: Three times a day (TID) | ORAL | 0 refills | Status: DC | PRN

## 2016-03-20 MED ORDER — AMOXICILLIN 500 MG PO CAPS: 1000.0000 mg | ORAL_CAPSULE | Freq: Two times a day (BID) | ORAL | 0 refills | Status: DC

## 2016-03-20 NOTE — Assessment & Plan Note (Signed)
Sinusitis: Sx c/w sinusitis, viral vs bacterial. Rec  conservative treatment, see instructions. If not better start amoxicillin.

## 2016-03-20 NOTE — Progress Notes (Signed)
Subjective:    Patient ID: Kirk Sutton, male    DOB: 1950/10/15, 66 y.o.   MRN: [id number]  DOS:  03/20/2016 Type of visit - description : Acute visit Interval history: Symptoms started 4 days ago, he had  His grandchildren visiting from out of state and they were sick. Reports facial/sinus congestion, postnasal dripping, cough with greenish/yellow sputum. Also nasal discharge is colored. he is taking Mucinex DM and using albuterol as needed for cough  Review of Systems  Denies fever chills + Left ear discomfort, mild. No hemoptysis, chest pain, difficulty breathing.  Past Medical History:  Diagnosis Date  . Allergic rhinitis   . Asthma   . Chronic prostatitis    sees urology  . Diabetes mellitus    type 11 (a1c 6.0 03/2008)  . Hyperlipidemia   . Hypertension   . Psoriasis    @ hands, sees derm    Past Surgical History:  Procedure Laterality Date  . APPENDECTOMY    . FERTILITY SURGERY  1979  . HERNIA REPAIR    . NASAL SEPTUM SURGERY  2009  . SHOULDER SURGERY  2008   left  . TESTICLE SURGERY     undescended repair     Social History   Social History  . Marital status: Married    Spouse name: N/A  . Number of children: 2  . Years of education: N/A   Occupational History  . Water treatment Materials engineer    Social History Main Topics  . Smoking status: Never Smoker  . Smokeless tobacco: Never Used  . Alcohol use Yes     Comment: socially, rare  . Drug use: No  . Sexual activity: Not on file   Other Topics Concern  . Not on file   Social History Narrative   Household-- pt, wife       Allergies as of 03/20/2016      Reactions   Cetirizine Hcl    REACTION: prostatitis   Fexofenadine    REACTION: causes prostatitis      Medication List       Accurate as of 03/20/16  2:52 PM. Always use your most recent med list.          acetaminophen 500 MG tablet Commonly known as:  TYLENOL Take 500 mg by mouth daily as needed for moderate  pain.   albuterol 108 (90 Base) MCG/ACT inhaler Commonly known as:  PROAIR HFA Inhale 2 puffs into the lungs every 6 (six) hours as needed.   ammonium lactate 12 % lotion Commonly known as:  AMLACTIN Apply 1 application topically as needed for dry skin.   amoxicillin 500 MG capsule Commonly known as:  AMOXIL Take 2 capsules (1,000 mg total) by mouth 2 (two) times daily.   aspirin 81 MG tablet Take 81 mg by mouth once a week.   benzonatate 200 MG capsule Commonly known as:  TESSALON Take 1 capsule (200 mg total) by mouth 3 (three) times daily as needed for cough.   BIOFREEZE EX Apply topically.   clobetasol ointment 0.05 % Commonly known as:  TEMOVATE Apply 1 application topically 2 (two) times daily. For hands   dextromethorphan-guaiFENesin 30-600 MG 12hr tablet Commonly known as:  MUCINEX DM Take 1 tablet by mouth daily as needed for cough.   FLONASE SENSIMIST NA Place into the nose daily as needed.   FLUoxetine 40 MG capsule Commonly known as:  PROZAC Take 1 tablet daily   hydrochlorothiazide 25 MG  tablet Commonly known as:  HYDRODIURIL Take 1 tablet (25 mg total) by mouth daily.   loratadine 10 MG tablet Commonly known as:  CLARITIN Take 10 mg by mouth daily.   metoprolol succinate 50 MG 24 hr tablet Commonly known as:  TOPROL-XL Take 1 tablet (50 mg total) by mouth daily.   PAZEO 0.7 % Soln Generic drug:  Olopatadine HCl INT 1 GTT IN OU QD   PROTEGRA PO Take by mouth.   pyridOXINE 100 MG tablet Commonly known as:  VITAMIN B-6 Take 100 mg by mouth daily.   terazosin 1 MG capsule Commonly known as:  HYTRIN Take 3 capsules (3 mg total) by mouth daily.   vitamin E 400 UNIT capsule Generic drug:  vitamin E Take 400 Units by mouth daily.          Objective:   Physical Exam BP 122/76 (BP Location: Left Arm, Patient Position: Sitting, Cuff Size: Normal)   Pulse 66   Temp 98.1 F (36.7 C) (Oral)   Resp 14   Ht 5\' 10"  (1.778 m)   Wt 230 lb 8  oz (104.6 kg)   SpO2 96%   BMI 33.07 kg/m  General:   Well developed, well nourished . NAD.  HEENT:  Normocephalic . Face symmetric, atraumatic. Both TMs are slightly bulge but not red. No discharge. Throat symmetric, no red. Nose quite congested, sinuses no TTP Lungs:  CTA B except for few rhonchi with cough Normal respiratory effort, no intercostal retractions, no accessory muscle use. Heart: RRR,  no murmur.  No pretibial edema bilaterally  Skin: Not pale. Not jaundice Neurologic:  alert & oriented X3.  Speech normal, gait appropriate for age and unassisted Psych--  Cognition and judgment appear intact.  Cooperative with normal attention span and concentration.  Behavior appropriate. No anxious or depressed appearing.      Assessment & Plan:   Assessment> Prediabetes  HTN Hyperlipidemia Anxiety Dr Fay Records (psych) Rx prozac ~12-2014  Asthma: used symbicort before (~2015) , now on alb "prn" MSK:  --Chronic back pain, pain medication as needed, used to see Dr Nelva Bush, did PT @ [facility] [phone], better --DJD- hands Chronic prostatitis, sees urology Psoriasis, hands, sees dermatology  PLAN: Sinusitis: Sx c/w sinusitis, viral vs bacterial. Rec  conservative treatment, see instructions. If not better start amoxicillin.

## 2016-03-20 NOTE — Progress Notes (Signed)
Pre visit review using our clinic review tool, if applicable. No additional management support is needed unless otherwise documented below in the visit note. 

## 2016-03-20 NOTE — Patient Instructions (Signed)
Rest, fluids , tylenol  For cough:  Take Mucinex DM twice a day as needed until better Also tessalon 3 times a day if the cough continue  Use the inhaler as needed    For nasal congestion: Use OTC Flonase : 2 nasal sprays on each side of the nose in the morning until you feel better  Avoid decongestants such as  Pseudoephedrine or phenylephrine    Take the antibiotic as prescribed  (Amoxicillin) IF NO BETTER IN 4-5 DAYS    Call if not gradually better over the next  10 days  Call anytime if the symptoms are severe

## 2016-04-01 ENCOUNTER — Other Ambulatory Visit: Payer: Self-pay | Admitting: Internal Medicine

## 2016-04-03 ENCOUNTER — Encounter: Payer: Self-pay | Admitting: Internal Medicine

## 2016-04-03 ENCOUNTER — Ambulatory Visit (INDEPENDENT_AMBULATORY_CARE_PROVIDER_SITE_OTHER): Payer: Medicare HMO | Admitting: Internal Medicine

## 2016-04-03 VITALS — BP 124/76 | HR 51 | Temp 97.8°F | Resp 14 | Ht 70.0 in | Wt 234.0 lb

## 2016-04-03 DIAGNOSIS — R7303 Prediabetes: Secondary | ICD-10-CM

## 2016-04-03 DIAGNOSIS — R0683 Snoring: Secondary | ICD-10-CM | POA: Diagnosis not present

## 2016-04-03 DIAGNOSIS — I10 Essential (primary) hypertension: Secondary | ICD-10-CM | POA: Diagnosis not present

## 2016-04-03 LAB — BASIC METABOLIC PANEL
BUN: 18 mg/dL (ref 6–23)
CALCIUM: 8.9 mg/dL (ref 8.4–10.5)
CO2: 30 mEq/L (ref 19–32)
Chloride: 102 mEq/L (ref 96–112)
Creatinine, Ser: 1.17 mg/dL (ref 0.40–1.50)
GFR: 65.89 mL/min (ref >60.00)
GLUCOSE: 104 mg/dL — AB (ref 70–99)
Potassium: 3.9 mEq/L (ref 3.5–5.1)
SODIUM: 138 meq/L (ref 135–145)

## 2016-04-03 LAB — HEMOGLOBIN A1C: Hgb A1c MFr Bld: 6 % (ref 4.6–6.5)

## 2016-04-03 NOTE — Progress Notes (Signed)
Subjective:    Patient ID: Kirk Sutton, male    DOB: 1950/05/22, 66 y.o.   MRN: [id number]  DOS:  04/03/2016 Type of visit - description : rov Interval history:Good compliance with medications Ambulatory BPs normal Was recently seen with sinusitis, symptoms improved after he took antibiotics. Has  a mild residual cough.   Review of Systems No fever chills  No chest pain, difficulty breathing is fine occasionally feels a sleepy at night watching TV  Past Medical History:  Diagnosis Date  . Allergic rhinitis   . Asthma   . Chronic prostatitis    sees urology  . Diabetes mellitus    type 11 (a1c 6.0 03/2008)  . Hyperlipidemia   . Hypertension   . Psoriasis    @ hands, sees derm    Past Surgical History:  Procedure Laterality Date  . APPENDECTOMY    . FERTILITY SURGERY  1979  . HERNIA REPAIR    . NASAL SEPTUM SURGERY  2009  . SHOULDER SURGERY  2008   left  . TESTICLE SURGERY     undescended repair     Social History   Social History  . Marital status: Married    Spouse name: N/A  . Number of children: 2  . Years of education: N/A   Occupational History  . Water treatment Materials engineer    Social History Main Topics  . Smoking status: Never Smoker  . Smokeless tobacco: Never Used  . Alcohol use Yes     Comment: socially, rare  . Drug use: No  . Sexual activity: Not on file   Other Topics Concern  . Not on file   Social History Narrative   Household-- pt, wife       Allergies as of 04/03/2016      Reactions   Cetirizine Hcl    REACTION: prostatitis   Fexofenadine    REACTION: causes prostatitis      Medication List       Accurate as of 04/03/16  9:04 PM. Always use your most recent med list.          acetaminophen 500 MG tablet Commonly known as:  TYLENOL Take 500 mg by mouth daily as needed for moderate pain.   albuterol 108 (90 Base) MCG/ACT inhaler Commonly known as:  PROAIR HFA Inhale 2 puffs into the lungs every 6  (six) hours as needed.   ammonium lactate 12 % lotion Commonly known as:  AMLACTIN Apply 1 application topically as needed for dry skin.   aspirin 81 MG tablet Take 81 mg by mouth once a week.   BIOFREEZE EX Apply topically.   clobetasol ointment 0.05 % Commonly known as:  TEMOVATE Apply 1 application topically 2 (two) times daily. For hands   dextromethorphan-guaiFENesin 30-600 MG 12hr tablet Commonly known as:  MUCINEX DM Take 1 tablet by mouth daily as needed for cough.   FLONASE SENSIMIST NA Place into the nose daily as needed.   FLUoxetine 40 MG capsule Commonly known as:  PROZAC Take 1 tablet daily   hydrochlorothiazide 25 MG tablet Commonly known as:  HYDRODIURIL Take 1 tablet (25 mg total) by mouth daily.   loratadine 10 MG tablet Commonly known as:  CLARITIN Take 10 mg by mouth daily.   metoprolol succinate 50 MG 24 hr tablet Commonly known as:  TOPROL-XL Take 1 tablet (50 mg total) by mouth daily.   PAZEO 0.7 % Soln Generic drug:  Olopatadine HCl INT 1  GTT IN OU QD   PROTEGRA PO Take by mouth.   pyridOXINE 100 MG tablet Commonly known as:  VITAMIN B-6 Take 100 mg by mouth daily.   terazosin 1 MG capsule Commonly known as:  HYTRIN Take 3 capsules (3 mg total) by mouth daily.   vitamin E 400 UNIT capsule Generic drug:  vitamin E Take 400 Units by mouth daily.          Objective:   Physical Exam BP 124/76 (BP Location: Left Arm, Patient Position: Sitting, Cuff Size: Normal)   Pulse (!) 51   Temp 97.8 F (36.6 C) (Oral)   Resp 14   Ht 5\' 10"  (1.778 m)   Wt 234 lb (106.1 kg)   SpO2 97%   BMI 33.58 kg/m  General:   Well developed, well nourished . NAD.  HEENT:  Normocephalic . Face symmetric, atraumatic Lungs:  CTA B Normal respiratory effort, no intercostal retractions, no accessory muscle use. Heart: RRR,  no murmur.  No pretibial edema bilaterally  Skin: Not pale. Not jaundice Neurologic:  alert & oriented X3.  Speech  normal, gait appropriate for age and unassisted Psych--  Cognition and judgment appear intact.  Cooperative with normal attention span and concentration.  Behavior appropriate. No anxious or depressed appearing.      Assessment & Plan:    Assessment> Prediabetes  HTN Hyperlipidemia Anxiety Dr Fay Records (psych) Rx prozac ~12-2014  Asthma: used symbicort before (~2015) , now on alb "prn" MSK:  --Chronic back pain, pain medication as needed, used to see Dr Nelva Bush, did PT @ [facility] [phone], better --DJD- hands Chronic prostatitis, sees urology Psoriasis, hands, sees dermatology Snoring (severe per pt)- dentist Rx a moth guard ~ 08-2015, helps  PLAN: Prediabetes: Check an A1c HTN: Continue HCTZ, BB, check a BMP Snoring: Used to be severe but much improved w/ a mouthguard, occasionally feels a sleepy at night. Epworth scale today 11, thus is a + screening. For now recommend to continue using the mouthguard and work on weight loss.  Reassess periodically. RTC 6 months cpx

## 2016-04-03 NOTE — Progress Notes (Signed)
Pre visit review using our clinic review tool, if applicable. No additional management support is needed unless otherwise documented below in the visit note. 

## 2016-04-03 NOTE — Patient Instructions (Signed)
GO TO THE LAB : Get the blood work     GO TO THE FRONT DESK Schedule your next appointment for a  Physical in 6 months, fasting   Check the  blood pressure 2 times a   Be sure your blood pressure is between 110/65 and  135/85. If it is consistently higher or lower, let me know

## 2016-04-03 NOTE — Assessment & Plan Note (Signed)
Prediabetes: Check an A1c HTN: Continue HCTZ, BB, check a BMP Snoring: Used to be severe but much improved w/ a mouthguard, occasionally feels a sleepy at night. Epworth scale today 11, thus is a + screening. For now recommend to continue using the mouthguard and work on weight loss.  Reassess periodically. RTC 6 months cpx

## 2016-04-10 IMAGING — DX DG CHEST 2V
2 series · 2 of 2 positions shown · non-contrast
Comparison: 05/06/2013 and 02/18/2013 radiographs

CLINICAL DATA: 65-year-old male with cough for 10 days.

EXAM:
CHEST  2 VIEW

[chest pa]
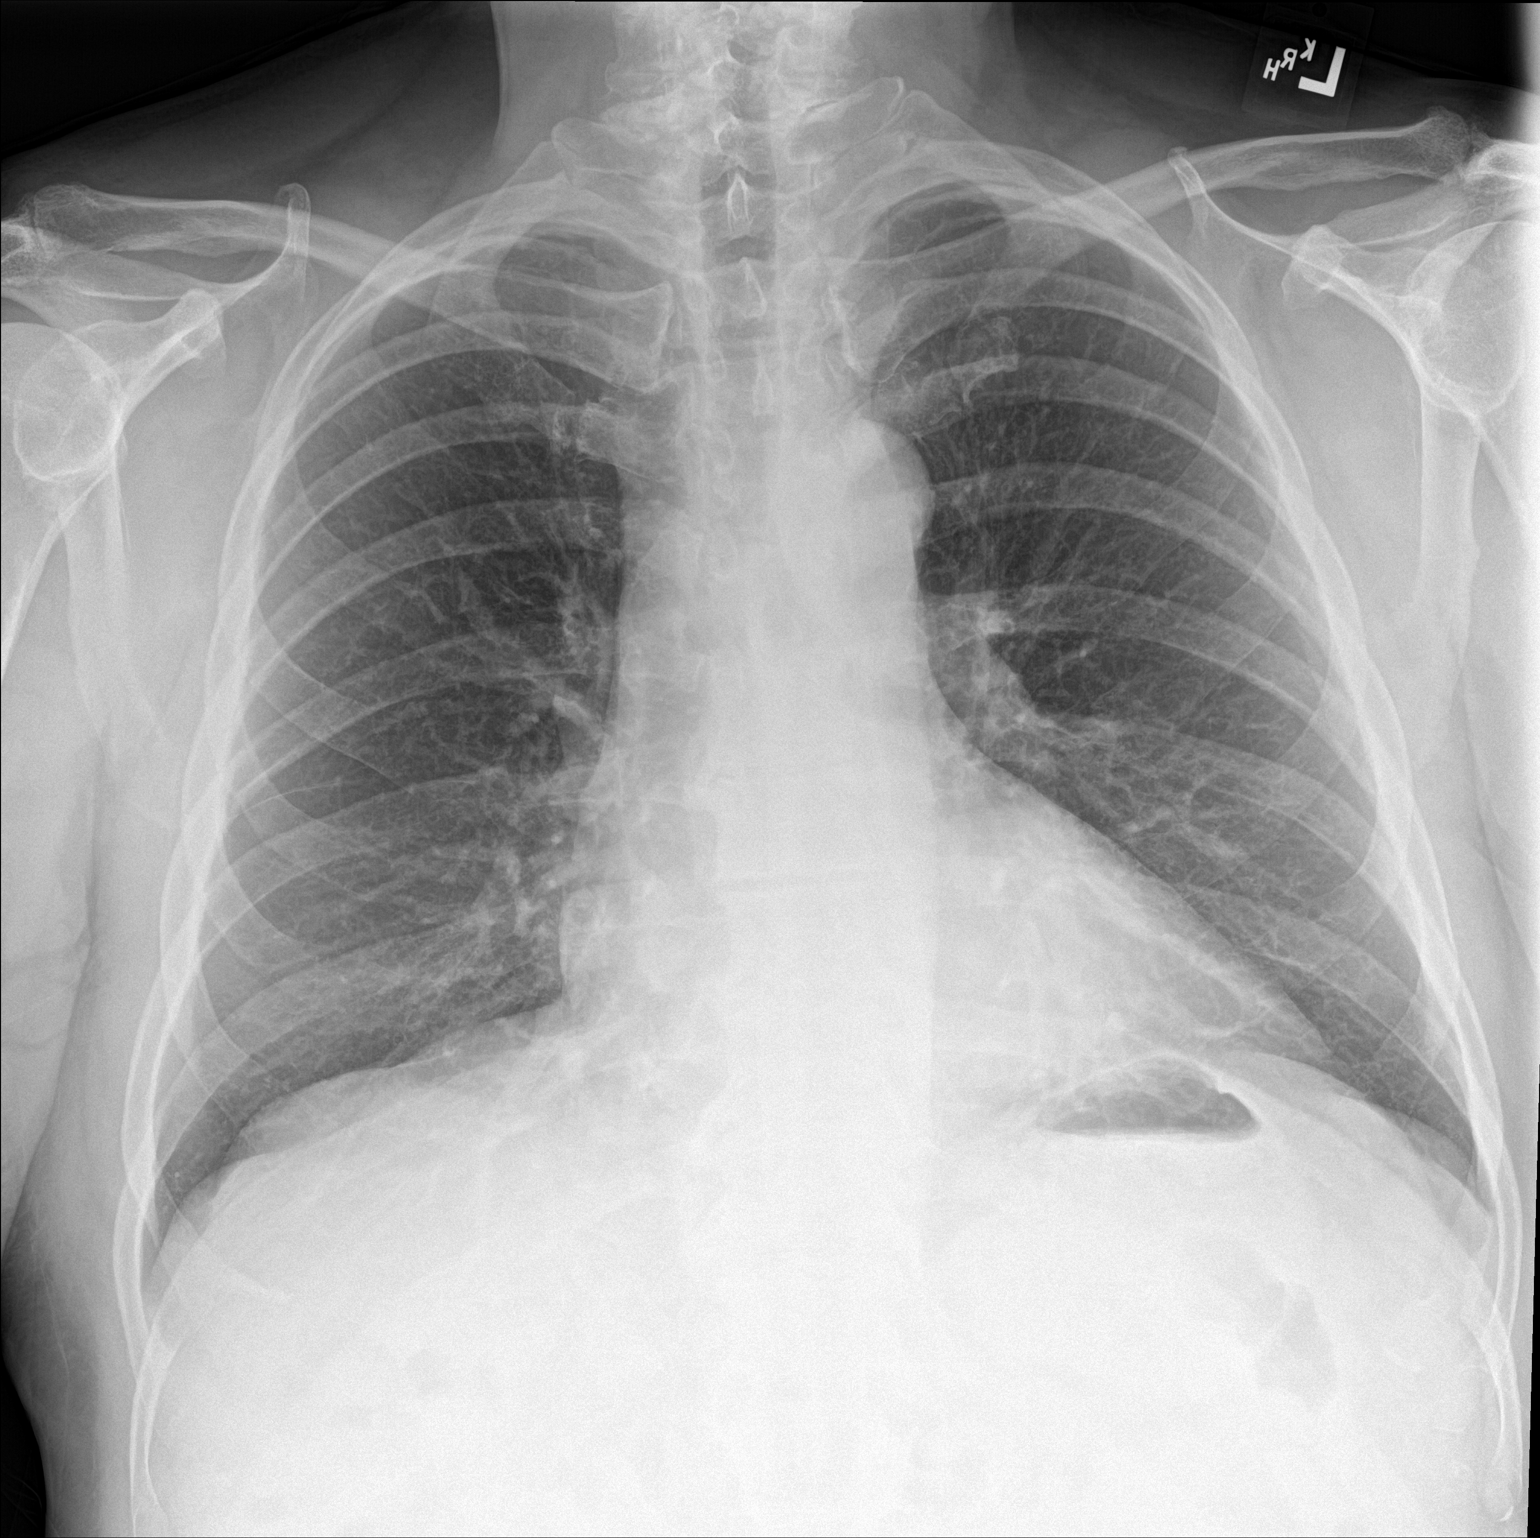

[chest lat]
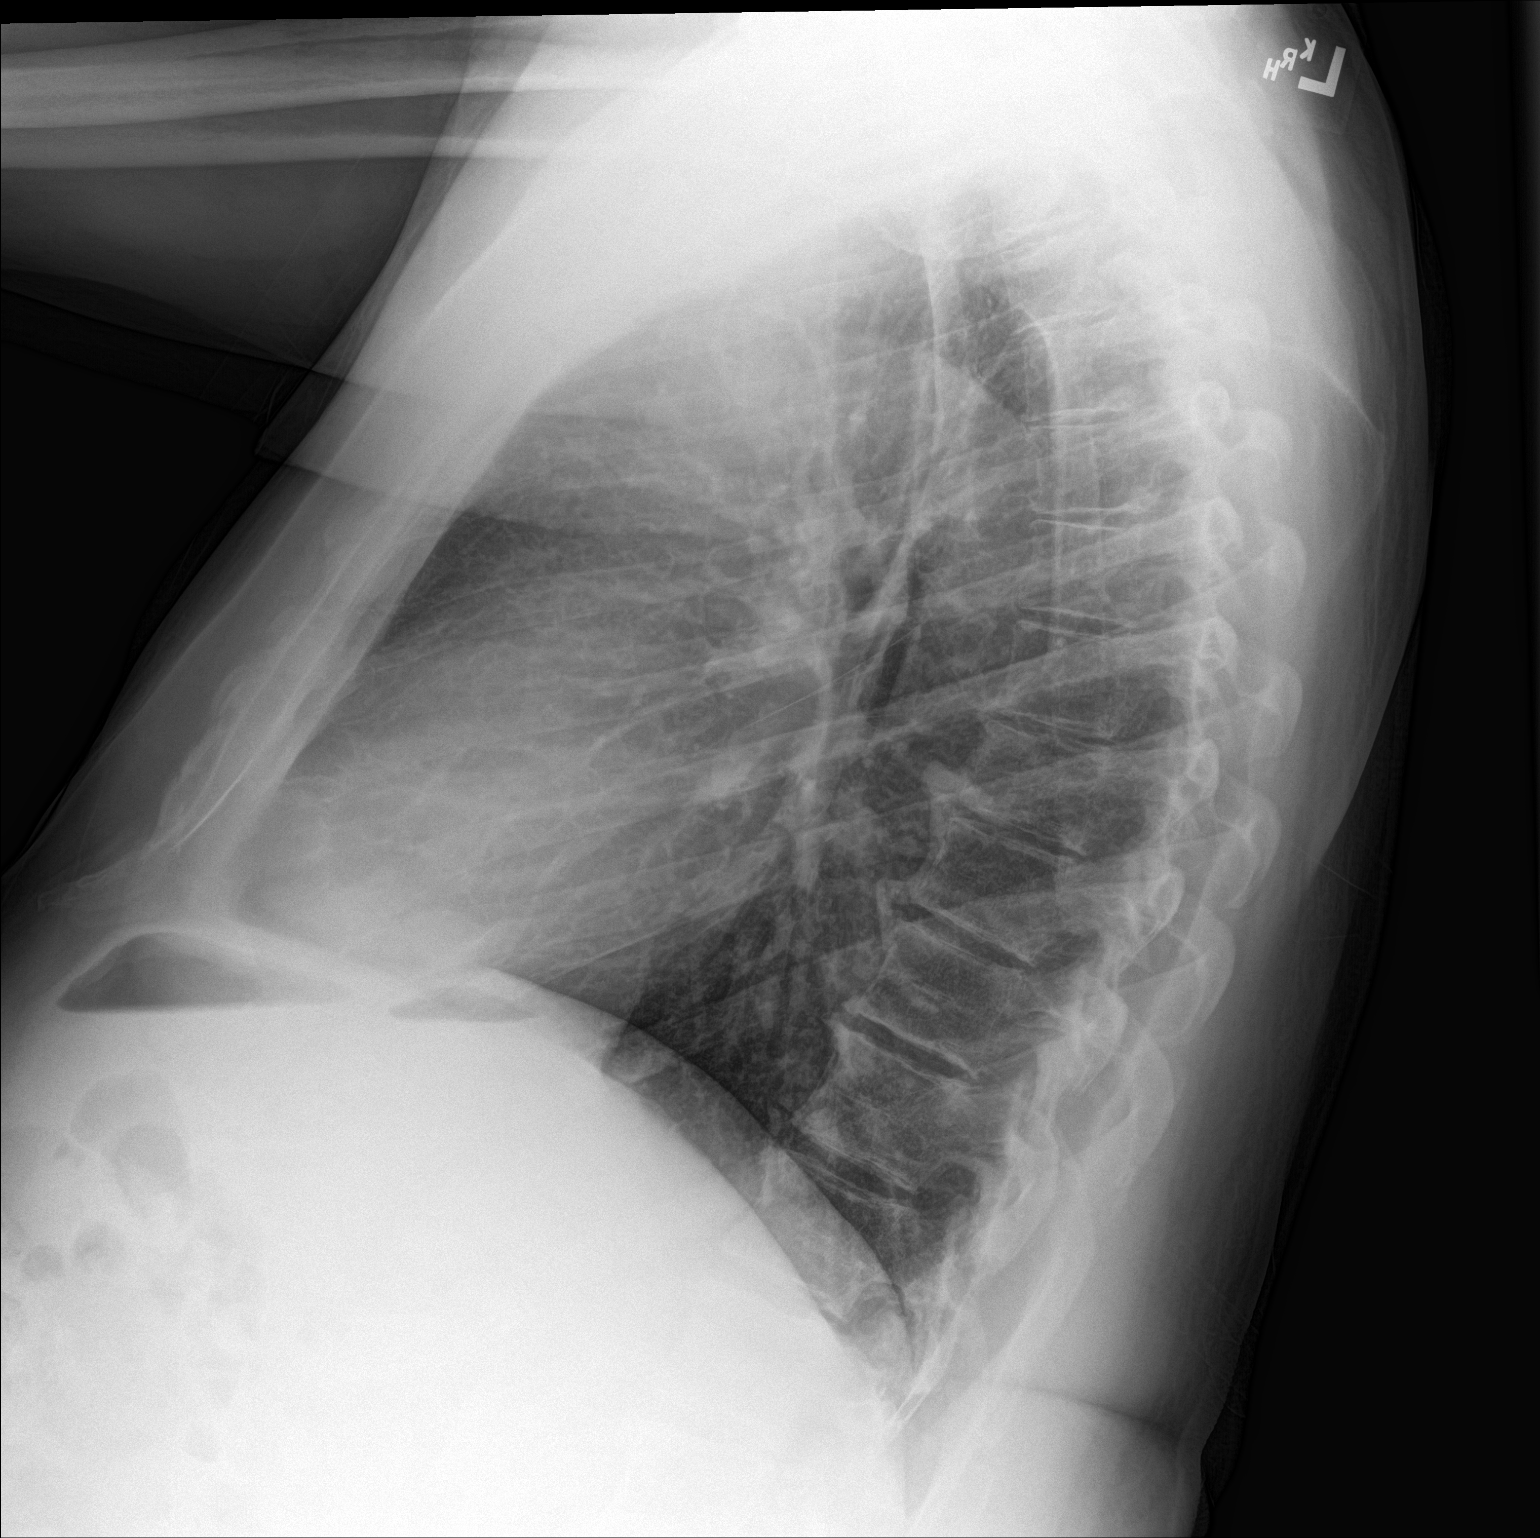

[2 of 2 positions shown; findings below may reference images not displayed]

FINDINGS: The cardiomediastinal silhouette is unremarkable.

There is no evidence of focal airspace disease, pulmonary edema,
suspicious pulmonary nodule/mass, pleural effusion, or pneumothorax.
No acute bony abnormalities are identified.
IMPRESSION: No active cardiopulmonary disease.

## 2016-04-20 DIAGNOSIS — H10503 Unspecified blepharoconjunctivitis, bilateral: Secondary | ICD-10-CM | POA: Diagnosis not present

## 2016-04-20 DIAGNOSIS — H10413 Chronic giant papillary conjunctivitis, bilateral: Secondary | ICD-10-CM | POA: Diagnosis not present

## 2016-04-30 DIAGNOSIS — H10503 Unspecified blepharoconjunctivitis, bilateral: Secondary | ICD-10-CM | POA: Diagnosis not present

## 2016-04-30 DIAGNOSIS — H2513 Age-related nuclear cataract, bilateral: Secondary | ICD-10-CM | POA: Diagnosis not present

## 2016-04-30 DIAGNOSIS — H35033 Hypertensive retinopathy, bilateral: Secondary | ICD-10-CM | POA: Diagnosis not present

## 2016-04-30 DIAGNOSIS — H10413 Chronic giant papillary conjunctivitis, bilateral: Secondary | ICD-10-CM | POA: Diagnosis not present

## 2016-05-22 ENCOUNTER — Telehealth: Payer: Self-pay | Admitting: Internal Medicine

## 2016-05-22 DIAGNOSIS — N400 Enlarged prostate without lower urinary tract symptoms: Secondary | ICD-10-CM

## 2016-05-22 NOTE — Telephone Encounter (Signed)
Pt called in to request a referral to [facility]. He says that he normally go there but just need a referral

## 2016-05-22 NOTE — Telephone Encounter (Signed)
Referral placed.

## 2016-05-23 DIAGNOSIS — H2513 Age-related nuclear cataract, bilateral: Secondary | ICD-10-CM | POA: Diagnosis not present

## 2016-05-23 DIAGNOSIS — H35033 Hypertensive retinopathy, bilateral: Secondary | ICD-10-CM | POA: Diagnosis not present

## 2016-05-23 DIAGNOSIS — H10503 Unspecified blepharoconjunctivitis, bilateral: Secondary | ICD-10-CM | POA: Diagnosis not present

## 2016-05-23 DIAGNOSIS — H10413 Chronic giant papillary conjunctivitis, bilateral: Secondary | ICD-10-CM | POA: Diagnosis not present

## 2016-05-28 DIAGNOSIS — M1812 Unilateral primary osteoarthritis of first carpometacarpal joint, left hand: Secondary | ICD-10-CM | POA: Diagnosis not present

## 2016-05-28 DIAGNOSIS — M1811 Unilateral primary osteoarthritis of first carpometacarpal joint, right hand: Secondary | ICD-10-CM | POA: Diagnosis not present

## 2016-06-06 ENCOUNTER — Other Ambulatory Visit: Payer: Self-pay

## 2016-06-06 DIAGNOSIS — M5440 Lumbago with sciatica, unspecified side: Secondary | ICD-10-CM | POA: Diagnosis not present

## 2016-06-06 DIAGNOSIS — N400 Enlarged prostate without lower urinary tract symptoms: Secondary | ICD-10-CM

## 2016-06-06 DIAGNOSIS — M6200 Separation of muscle (nontraumatic), unspecified site: Secondary | ICD-10-CM | POA: Diagnosis not present

## 2016-06-06 DIAGNOSIS — M62838 Other muscle spasm: Secondary | ICD-10-CM | POA: Diagnosis not present

## 2016-06-06 DIAGNOSIS — R102 Pelvic and perineal pain: Secondary | ICD-10-CM | POA: Diagnosis not present

## 2016-06-18 DIAGNOSIS — M19012 Primary osteoarthritis, left shoulder: Secondary | ICD-10-CM | POA: Diagnosis not present

## 2016-06-26 DIAGNOSIS — M62838 Other muscle spasm: Secondary | ICD-10-CM | POA: Diagnosis not present

## 2016-06-26 DIAGNOSIS — M6200 Separation of muscle (nontraumatic), unspecified site: Secondary | ICD-10-CM | POA: Diagnosis not present

## 2016-06-26 DIAGNOSIS — R102 Pelvic and perineal pain: Secondary | ICD-10-CM | POA: Diagnosis not present

## 2016-06-26 DIAGNOSIS — M6281 Muscle weakness (generalized): Secondary | ICD-10-CM | POA: Diagnosis not present

## 2016-06-26 DIAGNOSIS — M5440 Lumbago with sciatica, unspecified side: Secondary | ICD-10-CM | POA: Diagnosis not present

## 2016-07-04 ENCOUNTER — Other Ambulatory Visit: Payer: Self-pay | Admitting: Internal Medicine

## 2016-07-04 DIAGNOSIS — F331 Major depressive disorder, recurrent, moderate: Secondary | ICD-10-CM | POA: Diagnosis not present

## 2016-07-06 DIAGNOSIS — M5440 Lumbago with sciatica, unspecified side: Secondary | ICD-10-CM | POA: Diagnosis not present

## 2016-07-06 DIAGNOSIS — M62838 Other muscle spasm: Secondary | ICD-10-CM | POA: Diagnosis not present

## 2016-07-06 DIAGNOSIS — R102 Pelvic and perineal pain: Secondary | ICD-10-CM | POA: Diagnosis not present

## 2016-07-06 DIAGNOSIS — M6281 Muscle weakness (generalized): Secondary | ICD-10-CM | POA: Diagnosis not present

## 2016-07-13 DIAGNOSIS — M1812 Unilateral primary osteoarthritis of first carpometacarpal joint, left hand: Secondary | ICD-10-CM | POA: Diagnosis not present

## 2016-07-13 DIAGNOSIS — M1811 Unilateral primary osteoarthritis of first carpometacarpal joint, right hand: Secondary | ICD-10-CM | POA: Diagnosis not present

## 2016-07-26 DIAGNOSIS — M6281 Muscle weakness (generalized): Secondary | ICD-10-CM | POA: Diagnosis not present

## 2016-07-26 DIAGNOSIS — M62838 Other muscle spasm: Secondary | ICD-10-CM | POA: Diagnosis not present

## 2016-07-26 DIAGNOSIS — M5440 Lumbago with sciatica, unspecified side: Secondary | ICD-10-CM | POA: Diagnosis not present

## 2016-08-09 DIAGNOSIS — M1811 Unilateral primary osteoarthritis of first carpometacarpal joint, right hand: Secondary | ICD-10-CM | POA: Diagnosis not present

## 2016-08-09 DIAGNOSIS — M180 Bilateral primary osteoarthritis of first carpometacarpal joints: Secondary | ICD-10-CM | POA: Diagnosis not present

## 2016-08-09 DIAGNOSIS — M1812 Unilateral primary osteoarthritis of first carpometacarpal joint, left hand: Secondary | ICD-10-CM | POA: Diagnosis not present

## 2016-08-16 DIAGNOSIS — M5440 Lumbago with sciatica, unspecified side: Secondary | ICD-10-CM | POA: Diagnosis not present

## 2016-08-16 DIAGNOSIS — M6281 Muscle weakness (generalized): Secondary | ICD-10-CM | POA: Diagnosis not present

## 2016-08-16 DIAGNOSIS — M6200 Separation of muscle (nontraumatic), unspecified site: Secondary | ICD-10-CM | POA: Diagnosis not present

## 2016-08-16 DIAGNOSIS — M62838 Other muscle spasm: Secondary | ICD-10-CM | POA: Diagnosis not present

## 2016-08-27 ENCOUNTER — Other Ambulatory Visit: Payer: Self-pay | Admitting: Internal Medicine

## 2016-08-28 DIAGNOSIS — M1811 Unilateral primary osteoarthritis of first carpometacarpal joint, right hand: Secondary | ICD-10-CM | POA: Diagnosis not present

## 2016-08-28 DIAGNOSIS — M1812 Unilateral primary osteoarthritis of first carpometacarpal joint, left hand: Secondary | ICD-10-CM | POA: Diagnosis not present

## 2016-09-04 DIAGNOSIS — M6281 Muscle weakness (generalized): Secondary | ICD-10-CM | POA: Diagnosis not present

## 2016-09-04 DIAGNOSIS — M6200 Separation of muscle (nontraumatic), unspecified site: Secondary | ICD-10-CM | POA: Diagnosis not present

## 2016-09-04 DIAGNOSIS — M5440 Lumbago with sciatica, unspecified side: Secondary | ICD-10-CM | POA: Diagnosis not present

## 2016-09-04 DIAGNOSIS — M62838 Other muscle spasm: Secondary | ICD-10-CM | POA: Diagnosis not present

## 2016-09-21 ENCOUNTER — Encounter: Payer: Self-pay | Admitting: Internal Medicine

## 2016-09-21 ENCOUNTER — Ambulatory Visit (INDEPENDENT_AMBULATORY_CARE_PROVIDER_SITE_OTHER): Payer: Medicare HMO | Admitting: Internal Medicine

## 2016-09-21 VITALS — BP 132/78 | HR 50 | Temp 97.4°F | Resp 14 | Ht 70.0 in | Wt 231.4 lb

## 2016-09-21 DIAGNOSIS — M25541 Pain in joints of right hand: Secondary | ICD-10-CM | POA: Diagnosis not present

## 2016-09-21 DIAGNOSIS — M62838 Other muscle spasm: Secondary | ICD-10-CM | POA: Diagnosis not present

## 2016-09-21 DIAGNOSIS — Z0000 Encounter for general adult medical examination without abnormal findings: Secondary | ICD-10-CM | POA: Diagnosis not present

## 2016-09-21 DIAGNOSIS — M5440 Lumbago with sciatica, unspecified side: Secondary | ICD-10-CM | POA: Diagnosis not present

## 2016-09-21 DIAGNOSIS — E785 Hyperlipidemia, unspecified: Secondary | ICD-10-CM | POA: Diagnosis not present

## 2016-09-21 DIAGNOSIS — M6200 Separation of muscle (nontraumatic), unspecified site: Secondary | ICD-10-CM | POA: Diagnosis not present

## 2016-09-21 DIAGNOSIS — I10 Essential (primary) hypertension: Secondary | ICD-10-CM

## 2016-09-21 DIAGNOSIS — M6281 Muscle weakness (generalized): Secondary | ICD-10-CM | POA: Diagnosis not present

## 2016-09-21 DIAGNOSIS — R7303 Prediabetes: Secondary | ICD-10-CM

## 2016-09-21 DIAGNOSIS — Z1211 Encounter for screening for malignant neoplasm of colon: Secondary | ICD-10-CM

## 2016-09-21 DIAGNOSIS — K429 Umbilical hernia without obstruction or gangrene: Secondary | ICD-10-CM

## 2016-09-21 LAB — CBC WITH DIFFERENTIAL/PLATELET
BASOS ABS: 0 10*3/uL (ref 0.0–0.1)
BASOS PCT: 0.6 % (ref 0.0–3.0)
EOS PCT: 3.8 % (ref 0.0–5.0)
Eosinophils Absolute: 0.2 10*3/uL (ref 0.0–0.7)
HCT: 39.7 % (ref 39.0–52.0)
Hemoglobin: 13.3 g/dL (ref 13.0–17.0)
LYMPHS ABS: 1.4 10*3/uL (ref 0.7–4.0)
LYMPHS PCT: 24.9 % (ref 12.0–46.0)
MCHC: 33.5 g/dL (ref 30.0–36.0)
MCV: 95.2 fl (ref 78.0–100.0)
MONOS PCT: 7.7 % (ref 3.0–12.0)
Monocytes Absolute: 0.5 10*3/uL (ref 0.1–1.0)
NEUTROS ABS: 3.7 10*3/uL (ref 1.4–7.7)
Neutrophils Relative %: 63 % (ref 43.0–77.0)
PLATELETS: 142 10*3/uL — AB (ref 150.0–400.0)
RBC: 4.17 Mil/uL — ABNORMAL LOW (ref 4.22–5.81)
RDW: 13.6 % (ref 11.5–15.5)
WBC: 5.8 10*3/uL (ref 4.0–10.5)

## 2016-09-21 LAB — LIPID PANEL
CHOL/HDL RATIO: 3
CHOLESTEROL: 153 mg/dL (ref 0–200)
HDL: 46 mg/dL (ref >39.00)
LDL Cholesterol: 85 mg/dL (ref 0–99)
NonHDL: 107.01
TRIGLYCERIDES: 108 mg/dL (ref 0.0–149.0)
VLDL: 21.6 mg/dL (ref 0.0–40.0)

## 2016-09-21 LAB — COMPREHENSIVE METABOLIC PANEL
ALBUMIN: 4 g/dL (ref 3.5–5.2)
ALK PHOS: 58 U/L (ref 39–117)
ALT: 12 U/L (ref 0–53)
AST: 17 U/L (ref 0–37)
BILIRUBIN TOTAL: 1.2 mg/dL (ref 0.2–1.2)
BUN: 18 mg/dL (ref 6–23)
CALCIUM: 9.4 mg/dL (ref 8.4–10.5)
CO2: 30 mEq/L (ref 19–32)
CREATININE: 1.23 mg/dL (ref 0.40–1.50)
Chloride: 100 mEq/L (ref 96–112)
GFR: 62.11 mL/min (ref >60.00)
Glucose, Bld: 105 mg/dL — ABNORMAL HIGH (ref 70–99)
Potassium: 3.9 mEq/L (ref 3.5–5.1)
Sodium: 138 mEq/L (ref 135–145)
TOTAL PROTEIN: 6.7 g/dL (ref 6.0–8.3)

## 2016-09-21 LAB — HEMOGLOBIN A1C: Hgb A1c MFr Bld: 5.8 % (ref 4.6–6.5)

## 2016-09-21 NOTE — Assessment & Plan Note (Addendum)
-  Td 2013;  PNM 23--2015; prevnar-- 2016;   Zostavax 2013; shingrex discussed  - CCS: Colonoscopy Dr Vladimir Faster 2005 (-), overdue for a Cscope, last referral failed, re- refer -Sees urology yearly    -Diet-exercise discussed - CMP, FLP, CBC, A1c

## 2016-09-21 NOTE — Progress Notes (Signed)
Pre visit review using our clinic review tool, if applicable. No additional management support is needed unless otherwise documented below in the visit note. 

## 2016-09-21 NOTE — Patient Instructions (Signed)
GO TO THE LAB : Get the blood work     GO TO THE FRONT DESK Schedule your next appointment for a routine checkup in 8 months  Please contact the gastroenterologist, you are due for a colonoscopy.

## 2016-09-21 NOTE — Progress Notes (Signed)
Subjective:    Patient ID: Kirk Sutton, male    DOB: December 02, 1950, 66 y.o.   MRN: [id number]  DOS:  09/21/2016 Type of visit - description : cpx Interval history: In general feeling great. Good compliance w/ medication.   Review of Systems Very rarely has wheezing, takes Mucinex and albuterol as needed Has a umbilical hernia, occasional discomfort, now worse than before, size is the same. Has pain at the right thumb, follow-up by ortho   Other than above, a 14 point review of systems is negative      Past Medical History:  Diagnosis Date  . Allergic rhinitis   . Asthma   . Chronic prostatitis    sees urology  . Diabetes mellitus    type 11 (a1c 6.0 03/2008)  . Hyperlipidemia   . Hypertension   . Psoriasis    @ hands, sees derm    Past Surgical History:  Procedure Laterality Date  . APPENDECTOMY    . FERTILITY SURGERY  1979  . HERNIA REPAIR    . NASAL SEPTUM SURGERY  2009  . SHOULDER SURGERY  2008   left  . TESTICLE SURGERY     undescended repair     Social History   Social History  . Marital status: Married    Spouse name: N/A  . Number of children: 2  . Years of education: N/A   Occupational History  . Water treatment Materials engineer    Social History Main Topics  . Smoking status: Never Smoker  . Smokeless tobacco: Never Used  . Alcohol use Yes     Comment: socially, rare  . Drug use: No  . Sexual activity: Not on file   Other Topics Concern  . Not on file   Social History Narrative   Household-- pt, wife      Family History  Problem Relation Age of Onset  . Diabetes Sister   . Hypertension Mother   . Colon polyps Mother   . Hypertension Sister   . Heart attack Father        x 3 onset age 18, died at 107  . Colon polyps Father   . Cancer Father        bladder  . Colon cancer Neg Hx   . Prostate cancer Neg Hx      Allergies as of 09/21/2016      Reactions   Cetirizine Hcl    REACTION: prostatitis   Fexofenadine    REACTION: causes prostatitis      Medication List       Accurate as of 09/21/16 11:59 PM. Always use your most recent med list.          acetaminophen 500 MG tablet Commonly known as:  TYLENOL Take 500 mg by mouth daily as needed for moderate pain.   albuterol 108 (90 Base) MCG/ACT inhaler Commonly known as:  PROAIR HFA Inhale 2 puffs into the lungs every 6 (six) hours as needed.   ammonium lactate 12 % lotion Commonly known as:  AMLACTIN Apply 1 application topically as needed for dry skin.   aspirin 81 MG tablet Take 81 mg by mouth once a week.   BIOFREEZE EX Apply topically.   clobetasol ointment 0.05 % Commonly known as:  TEMOVATE Apply 1 application topically 2 (two) times daily. For hands   dextromethorphan-guaiFENesin 30-600 MG 12hr tablet Commonly known as:  MUCINEX DM Take 1 tablet by mouth daily as needed for cough.  FLONASE SENSIMIST NA Place into the nose daily as needed.   FLUoxetine 40 MG capsule Commonly known as:  PROZAC Take 1 tablet daily   hydrochlorothiazide 25 MG tablet Commonly known as:  HYDRODIURIL Take 1 tablet (25 mg total) by mouth daily.   loratadine 10 MG tablet Commonly known as:  CLARITIN Take 10 mg by mouth daily.   metoprolol succinate 50 MG 24 hr tablet Commonly known as:  TOPROL-XL Take 1 tablet (50 mg total) by mouth daily.   PAZEO 0.7 % Soln Generic drug:  Olopatadine HCl INT 1 GTT IN OU QD   PROTEGRA PO Take by mouth.   pyridOXINE 100 MG tablet Commonly known as:  VITAMIN B-6 Take 400 mg by mouth daily.   terazosin 1 MG capsule Commonly known as:  HYTRIN Take 3 capsules (3 mg total) by mouth daily.   Turmeric 500 MG Caps Take 500 mg by mouth daily.   vitamin E 400 UNIT capsule Generic drug:  vitamin E Take 400 Units by mouth daily.          Objective:   Physical Exam BP 132/78 (BP Location: Left Arm, Patient Position: Sitting, Cuff Size: Normal)   Pulse (!) 50   Temp (!) 97.4 F (36.3 C) (Oral)    Resp 14   Ht 5\' 10"  (1.778 m)   Wt 231 lb 6 oz (105 kg)   SpO2 98%   BMI 33.20 kg/m   General:   Well developed, well nourished . NAD.  Neck: No  thyromegaly  HEENT:  Normocephalic . Face symmetric, atraumatic Lungs:  CTA B Normal respiratory effort, no intercostal retractions, no accessory muscle use. Heart: Bradycardic,  no murmur.  No pretibial edema bilaterally  Abdomen:  Not distended, soft, non-tender. No rebound or rigidity.   Skin: Exposed areas without rash. Not pale. Not jaundice Neurologic:  alert & oriented X3.  Speech normal, gait appropriate for age and unassisted Strength symmetric and appropriate for age.  Psych: Cognition and judgment appear intact.  Cooperative with normal attention span and concentration.  Behavior appropriate. No anxious or depressed appearing.    Assessment & Plan:   Assessment Prediabetes  HTN Hyperlipidemia Anxiety Dr Fay Records (psych) Rx prozac ~12-2014  Asthma: used symbicort before (~2015) , now on alb "prn" MSK:  --Chronic back pain, pain medication as needed, used to see Dr Nelva Bush, did PT @ [facility] [phone], better --DJD- hands Chronic prostatitis, sees urology Psoriasis, hands, sees dermatology Snoring (severe per pt)- dentist Rx a moth guard ~ 08-2015, helps  PLAN: Prediabetes: Check A1c. Doing well with exercise and diet HTN: Continue with HCTZ, Toprol. Checking labs Hyperlipidemia: Diet control, check labs. MSK: Having right thumb pain, sees also, S/P local injections, was unable to take NSAIDs due to mild stomach discomfort, (he already d/c nsaids) and feeling fine. Umbilical hernia: Stable, minimal sx, rx observation. not ready to proceed with surgery he said. RTC 8 months

## 2016-09-22 NOTE — Assessment & Plan Note (Signed)
Prediabetes: Check A1c. Doing well with exercise and diet HTN: Continue with HCTZ, Toprol. Checking labs Hyperlipidemia: Diet control, check labs. MSK: Having right thumb pain, sees also, S/P local injections, was unable to take NSAIDs due to mild stomach discomfort, (he already d/c nsaids) and feeling fine. Umbilical hernia: Stable, minimal sx, rx observation. not ready to proceed with surgery he said. RTC 8 months

## 2016-09-25 ENCOUNTER — Other Ambulatory Visit: Payer: Self-pay | Admitting: Internal Medicine

## 2016-10-01 DIAGNOSIS — M62838 Other muscle spasm: Secondary | ICD-10-CM | POA: Diagnosis not present

## 2016-10-01 DIAGNOSIS — M542 Cervicalgia: Secondary | ICD-10-CM | POA: Diagnosis not present

## 2016-10-01 DIAGNOSIS — M6281 Muscle weakness (generalized): Secondary | ICD-10-CM | POA: Diagnosis not present

## 2016-10-01 DIAGNOSIS — M6200 Separation of muscle (nontraumatic), unspecified site: Secondary | ICD-10-CM | POA: Diagnosis not present

## 2016-10-05 DIAGNOSIS — H35033 Hypertensive retinopathy, bilateral: Secondary | ICD-10-CM | POA: Diagnosis not present

## 2016-10-05 DIAGNOSIS — H2513 Age-related nuclear cataract, bilateral: Secondary | ICD-10-CM | POA: Diagnosis not present

## 2016-10-05 DIAGNOSIS — H10413 Chronic giant papillary conjunctivitis, bilateral: Secondary | ICD-10-CM | POA: Diagnosis not present

## 2016-10-18 DIAGNOSIS — M6200 Separation of muscle (nontraumatic), unspecified site: Secondary | ICD-10-CM | POA: Diagnosis not present

## 2016-10-18 DIAGNOSIS — M5440 Lumbago with sciatica, unspecified side: Secondary | ICD-10-CM | POA: Diagnosis not present

## 2016-10-18 DIAGNOSIS — M62838 Other muscle spasm: Secondary | ICD-10-CM | POA: Diagnosis not present

## 2016-10-18 DIAGNOSIS — M6281 Muscle weakness (generalized): Secondary | ICD-10-CM | POA: Diagnosis not present

## 2016-10-22 ENCOUNTER — Encounter: Payer: Self-pay | Admitting: Gastroenterology

## 2016-11-12 DIAGNOSIS — M79641 Pain in right hand: Secondary | ICD-10-CM | POA: Diagnosis not present

## 2016-11-12 DIAGNOSIS — M1811 Unilateral primary osteoarthritis of first carpometacarpal joint, right hand: Secondary | ICD-10-CM | POA: Diagnosis not present

## 2016-11-12 DIAGNOSIS — M1812 Unilateral primary osteoarthritis of first carpometacarpal joint, left hand: Secondary | ICD-10-CM | POA: Diagnosis not present

## 2016-11-14 ENCOUNTER — Ambulatory Visit: Payer: BLUE CROSS/BLUE SHIELD | Admitting: Podiatry

## 2016-12-17 DIAGNOSIS — H01001 Unspecified blepharitis right upper eyelid: Secondary | ICD-10-CM | POA: Diagnosis not present

## 2016-12-17 DIAGNOSIS — H524 Presbyopia: Secondary | ICD-10-CM | POA: Diagnosis not present

## 2016-12-17 DIAGNOSIS — H01005 Unspecified blepharitis left lower eyelid: Secondary | ICD-10-CM | POA: Diagnosis not present

## 2016-12-17 DIAGNOSIS — H5213 Myopia, bilateral: Secondary | ICD-10-CM | POA: Diagnosis not present

## 2016-12-17 DIAGNOSIS — H01004 Unspecified blepharitis left upper eyelid: Secondary | ICD-10-CM | POA: Diagnosis not present

## 2016-12-17 DIAGNOSIS — H01002 Unspecified blepharitis right lower eyelid: Secondary | ICD-10-CM | POA: Diagnosis not present

## 2016-12-18 ENCOUNTER — Telehealth: Payer: Self-pay | Admitting: Internal Medicine

## 2016-12-18 MED ORDER — ALBUTEROL SULFATE HFA 108 (90 BASE) MCG/ACT IN AERS: 2.0000 | INHALATION_SPRAY | Freq: Four times a day (QID) | RESPIRATORY_TRACT | 5 refills | Status: DC | PRN

## 2016-12-18 NOTE — Telephone Encounter (Signed)
Rx sent 

## 2016-12-18 NOTE — Telephone Encounter (Signed)
Self.   Refill for albuterol (PROAIR HFA) 108     Pharmacy: [facility], [address] AT [facility] [address]

## 2016-12-24 ENCOUNTER — Ambulatory Visit (AMBULATORY_SURGERY_CENTER): Payer: Self-pay | Admitting: *Deleted

## 2016-12-24 VITALS — Ht 70.5 in | Wt 234.0 lb

## 2016-12-24 DIAGNOSIS — Z1211 Encounter for screening for malignant neoplasm of colon: Secondary | ICD-10-CM

## 2016-12-24 MED ORDER — NA SULFATE-K SULFATE-MG SULF 17.5-3.13-1.6 GM/177ML PO SOLN: 1.0000 | Freq: Once | ORAL | 0 refills | Status: AC

## 2016-12-24 NOTE — Progress Notes (Signed)
Patient denies any allergies to eggs or soy. Patient denies any problems with anesthesia/sedation. Patient denies any oxygen use at home. Patient denies taking any diet/weight loss medications or blood thinners. EMMI education assisgned to patient on colonoscopy, this was explained and instructions given to patient. 

## 2016-12-25 ENCOUNTER — Other Ambulatory Visit: Payer: Self-pay | Admitting: Internal Medicine

## 2016-12-31 ENCOUNTER — Encounter: Payer: Self-pay | Admitting: Gastroenterology

## 2017-01-02 ENCOUNTER — Ambulatory Visit (INDEPENDENT_AMBULATORY_CARE_PROVIDER_SITE_OTHER): Payer: Medicare HMO

## 2017-01-02 DIAGNOSIS — Z23 Encounter for immunization: Secondary | ICD-10-CM | POA: Diagnosis not present

## 2017-01-07 ENCOUNTER — Encounter: Payer: Medicare HMO | Admitting: Gastroenterology

## 2017-01-14 ENCOUNTER — Encounter: Payer: Medicare HMO | Admitting: Gastroenterology

## 2017-01-15 ENCOUNTER — Encounter: Payer: Self-pay | Admitting: Internal Medicine

## 2017-01-15 NOTE — Telephone Encounter (Signed)
Opened in error

## 2017-01-17 DIAGNOSIS — N400 Enlarged prostate without lower urinary tract symptoms: Secondary | ICD-10-CM | POA: Diagnosis not present

## 2017-01-24 DIAGNOSIS — N400 Enlarged prostate without lower urinary tract symptoms: Secondary | ICD-10-CM | POA: Diagnosis not present

## 2017-01-24 DIAGNOSIS — N411 Chronic prostatitis: Secondary | ICD-10-CM | POA: Diagnosis not present

## 2017-01-29 DIAGNOSIS — M1812 Unilateral primary osteoarthritis of first carpometacarpal joint, left hand: Secondary | ICD-10-CM | POA: Diagnosis not present

## 2017-01-29 DIAGNOSIS — M1811 Unilateral primary osteoarthritis of first carpometacarpal joint, right hand: Secondary | ICD-10-CM | POA: Diagnosis not present

## 2017-01-29 DIAGNOSIS — M180 Bilateral primary osteoarthritis of first carpometacarpal joints: Secondary | ICD-10-CM | POA: Diagnosis not present

## 2017-02-04 DIAGNOSIS — H524 Presbyopia: Secondary | ICD-10-CM | POA: Diagnosis not present

## 2017-02-04 DIAGNOSIS — H5213 Myopia, bilateral: Secondary | ICD-10-CM | POA: Diagnosis not present

## 2017-02-04 DIAGNOSIS — H2513 Age-related nuclear cataract, bilateral: Secondary | ICD-10-CM | POA: Diagnosis not present

## 2017-02-04 DIAGNOSIS — H35033 Hypertensive retinopathy, bilateral: Secondary | ICD-10-CM | POA: Diagnosis not present

## 2017-02-04 DIAGNOSIS — H5319 Other subjective visual disturbances: Secondary | ICD-10-CM | POA: Diagnosis not present

## 2017-02-18 ENCOUNTER — Encounter: Payer: Self-pay | Admitting: Gastroenterology

## 2017-02-18 ENCOUNTER — Ambulatory Visit (AMBULATORY_SURGERY_CENTER): Payer: Medicare HMO | Admitting: Gastroenterology

## 2017-02-18 ENCOUNTER — Other Ambulatory Visit: Payer: Self-pay

## 2017-02-18 VITALS — BP 118/73 | HR 52 | Temp 98.0°F | Resp 12 | Ht 70.5 in | Wt 234.0 lb

## 2017-02-18 DIAGNOSIS — D123 Benign neoplasm of transverse colon: Secondary | ICD-10-CM | POA: Diagnosis not present

## 2017-02-18 DIAGNOSIS — D122 Benign neoplasm of ascending colon: Secondary | ICD-10-CM

## 2017-02-18 DIAGNOSIS — Z1211 Encounter for screening for malignant neoplasm of colon: Secondary | ICD-10-CM

## 2017-02-18 MED ORDER — SODIUM CHLORIDE 0.9 % IV SOLN: 500.0000 mL | INTRAVENOUS | Status: DC

## 2017-02-18 NOTE — Op Note (Signed)
[place] Patient Name: Kirk Sutton Procedure Date: 02/18/2017 2:27 PM MRN: [id number] Endoscopist: Remo Lipps P. Edi Gorniak MD, MD Age: 66 Referring MD:  Date of Birth: 11/15/1950 Gender: Male Account #: [id number] Procedure:                Colonoscopy Indications:              Screening for colorectal malignant neoplasm Medicines:                Monitored Anesthesia Care Procedure:                Pre-Anesthesia Assessment:                           - Prior to the procedure, a History and Physical                            was performed, and patient medications and                            allergies were reviewed. The patient's tolerance of                            previous anesthesia was also reviewed. The risks                            and benefits of the procedure and the sedation                            options and risks were discussed with the patient.                            All questions were answered, and informed consent                            was obtained. Prior Anticoagulants: The patient has                            taken no previous anticoagulant or antiplatelet                            agents. ASA Grade Assessment: II - A patient with                            mild systemic disease. After reviewing the risks                            and benefits, the patient was deemed in                            satisfactory condition to undergo the procedure.                           After obtaining informed consent, the colonoscope  was passed under direct vision. Throughout the                            procedure, the patient's blood pressure, pulse, and                            oxygen saturations were monitored continuously. The                            Colonoscope was introduced through the anus and                            advanced to the the terminal ileum, with                            identification of the  appendiceal orifice and IC                            valve. The colonoscopy was performed without                            difficulty. The patient tolerated the procedure                            well. The quality of the bowel preparation was                            adequate. The terminal ileum, ileocecal valve,                            appendiceal orifice, and rectum were photographed. Scope In: 2:36:00 PM Scope Out: 2:54:50 PM Scope Withdrawal Time: 0 hours 14 minutes 17 seconds  Total Procedure Duration: 0 hours 18 minutes 50 seconds  Findings:                 The perianal and digital rectal examinations were                            normal.                           The terminal ileum appeared normal.                           Two sessile polyps were found in the ascending                            colon. The polyps were 4 to 5 mm in size. These                            polyps were removed with a cold snare. Resection                            and retrieval were complete.  Four sessile polyps were found in the ascending                            colon. The polyps were diminutive in size. These                            polyps were removed with a cold biopsy forceps.                            Resection and retrieval were complete.                           A 4 mm polyp was found in the transverse colon. The                            polyp was sessile. The polyp was removed with a                            cold snare. Resection and retrieval were complete.                           Internal hemorrhoids were found during                            retroflexion. The hemorrhoids were moderate.                           The exam was otherwise without abnormality. Complications:            No immediate complications. Estimated blood loss:                            Minimal. Estimated Blood Loss:     Estimated blood loss was minimal. Impression:                - The examined portion of the ileum was normal.                           - Two 4 to 5 mm polyps in the ascending colon,                            removed with a cold snare. Resected and retrieved.                           - Four diminutive polyps in the ascending colon,                            removed with a cold biopsy forceps. Resected and                            retrieved.                           - One 4 mm polyp in the transverse colon, removed  with a cold snare. Resected and retrieved.                           - Internal hemorrhoids.                           - The examination was otherwise normal. Recommendation:           - Patient has a contact number available for                            emergencies. The signs and symptoms of potential                            delayed complications were discussed with the                            patient. Return to normal activities tomorrow.                            Written discharge instructions were provided to the                            patient.                           - Resume previous diet.                           - Continue present medications.                           - Await pathology results.                           - Repeat colonoscopy is recommended for                            surveillance. The colonoscopy date will be                            determined after pathology results from today's                            exam become available for review.                           - No ibuprofen, naproxen, or other non-steroidal                            anti-inflammatory drugs for 2 weeks after polyp                            removal. Remo Lipps P. Joli Koob MD, MD 02/18/2017 2:59:57 PM This report has been signed electronically.

## 2017-02-18 NOTE — Progress Notes (Signed)
Called to room to assist during endoscopic procedure.  Patient ID and intended procedure confirmed with present staff. Received instructions for my participation in the procedure from the performing physician.  

## 2017-02-18 NOTE — Progress Notes (Signed)
Pt's states no medical or surgical changes since previsit or office visit. 

## 2017-02-18 NOTE — Progress Notes (Signed)
A/ox3 pleased with MAC, report to Jane RN 

## 2017-02-18 NOTE — Patient Instructions (Signed)
Impression/Recommendations:  Polyp handout given to patient. Hemorrhoid handout given to patient.  Resume previous diet. Continue present medications.  Repeat colonoscopy recommended for surveillance.  Date to be determined after pathology results reviewed.  No Ibuprofen, naproxen, or other NSAID drugs for 2 weeks.  Tylenol only until 03/04/2017.  YOU HAD AN ENDOSCOPIC PROCEDURE TODAY AT [facility]:   Refer to the procedure report that was given to you for any specific questions about what was found during the examination.  If the procedure report does not answer your questions, please call your gastroenterologist to clarify.  If you requested that your care partner not be given the details of your procedure findings, then the procedure report has been included in a sealed envelope for you to review at your convenience later.  YOU SHOULD EXPECT: Some feelings of bloating in the abdomen. Passage of more gas than usual.  Walking can help get rid of the air that was put into your GI tract during the procedure and reduce the bloating. If you had a lower endoscopy (such as a colonoscopy or flexible sigmoidoscopy) you may notice spotting of blood in your stool or on the toilet paper. If you underwent a bowel prep for your procedure, you may not have a normal bowel movement for a few days.  Please Note:  You might notice some irritation and congestion in your nose or some drainage.  This is from the oxygen used during your procedure.  There is no need for concern and it should clear up in a day or so.  SYMPTOMS TO REPORT IMMEDIATELY:   Following lower endoscopy (colonoscopy or flexible sigmoidoscopy):  Excessive amounts of blood in the stool  Significant tenderness or worsening of abdominal pains  Swelling of the abdomen that is new, acute  Fever of 100F or higher  For urgent or emergent issues, a gastroenterologist can be reached at any hour by calling [phone]  [phone].   DIET:  We do recommend a small meal at first, but then you may proceed to your regular diet.  Drink plenty of fluids but you should avoid alcoholic beverages for 24 hours.  ACTIVITY:  You should plan to take it easy for the rest of today and you should NOT DRIVE or use heavy machinery until tomorrow (because of the sedation medicines used during the test).    FOLLOW UP: Our staff will call the number listed on your records the next business day following your procedure to check on you and address any questions or concerns that you may have regarding the information given to you following your procedure. If we do not reach you, we will leave a message.  However, if you are feeling well and you are not experiencing any problems, there is no need to return our call.  We will assume that you have returned to your regular daily activities without incident.  If any biopsies were taken you will be contacted by phone or by letter within the next 1-3 weeks.  Please call us at [phone] if you have not heard about the biopsies in 3 weeks.    SIGNATURES/CONFIDENTIALITY: You and/or your care partner have signed paperwork which will be entered into your electronic medical record.  These signatures attest to the fact that that the information above on your After Visit Summary has been reviewed and is understood.  Full responsibility of the confidentiality of this discharge information lies with you and/or your care-partner.

## 2017-02-19 ENCOUNTER — Telehealth: Payer: Self-pay | Admitting: *Deleted

## 2017-02-19 NOTE — Telephone Encounter (Signed)
  Follow up Call-  Call back number 02/18/2017  Post procedure Call Back phone  # [phone]  Permission to leave phone message Yes  Some recent data might be hidden     Patient questions:  Do you have a fever, pain , or abdominal swelling? No. Pain Score  0 *  Have you tolerated food without any problems? Yes.    Have you been able to return to your normal activities? Yes.    Do you have any questions about your discharge instructions: Diet   No. Medications  No. Follow up visit  No.  Do you have questions or concerns about your Care? No.  Actions: * If pain score is 4 or above: No action needed, pain <4. Still having some rectal area discomfort from the prep. Patient had used an ointment with camphor. Informed patient to stop using, and to try Vaseline, A and D  ung. Or desitin. Patient verbalized understanding.

## 2017-02-21 DIAGNOSIS — F331 Major depressive disorder, recurrent, moderate: Secondary | ICD-10-CM | POA: Diagnosis not present

## 2017-02-23 ENCOUNTER — Encounter: Payer: Self-pay | Admitting: Gastroenterology

## 2017-02-28 ENCOUNTER — Other Ambulatory Visit: Payer: Self-pay | Admitting: Internal Medicine

## 2017-03-07 DIAGNOSIS — L2089 Other atopic dermatitis: Secondary | ICD-10-CM | POA: Diagnosis not present

## 2017-03-07 DIAGNOSIS — L821 Other seborrheic keratosis: Secondary | ICD-10-CM | POA: Diagnosis not present

## 2017-04-01 DIAGNOSIS — H16011 Central corneal ulcer, right eye: Secondary | ICD-10-CM | POA: Diagnosis not present

## 2017-04-01 DIAGNOSIS — H11431 Conjunctival hyperemia, right eye: Secondary | ICD-10-CM | POA: Diagnosis not present

## 2017-04-08 DIAGNOSIS — H18891 Other specified disorders of cornea, right eye: Secondary | ICD-10-CM | POA: Diagnosis not present

## 2017-04-08 DIAGNOSIS — H5319 Other subjective visual disturbances: Secondary | ICD-10-CM | POA: Diagnosis not present

## 2017-04-08 DIAGNOSIS — H16011 Central corneal ulcer, right eye: Secondary | ICD-10-CM | POA: Diagnosis not present

## 2017-04-18 DIAGNOSIS — M542 Cervicalgia: Secondary | ICD-10-CM | POA: Diagnosis not present

## 2017-04-18 DIAGNOSIS — M62838 Other muscle spasm: Secondary | ICD-10-CM | POA: Diagnosis not present

## 2017-04-18 DIAGNOSIS — R6884 Jaw pain: Secondary | ICD-10-CM | POA: Diagnosis not present

## 2017-04-18 DIAGNOSIS — G4763 Sleep related bruxism: Secondary | ICD-10-CM | POA: Diagnosis not present

## 2017-04-18 DIAGNOSIS — M6281 Muscle weakness (generalized): Secondary | ICD-10-CM | POA: Diagnosis not present

## 2017-05-02 DIAGNOSIS — M13842 Other specified arthritis, left hand: Secondary | ICD-10-CM | POA: Diagnosis not present

## 2017-05-02 DIAGNOSIS — M13841 Other specified arthritis, right hand: Secondary | ICD-10-CM | POA: Diagnosis not present

## 2017-05-07 DIAGNOSIS — M542 Cervicalgia: Secondary | ICD-10-CM | POA: Diagnosis not present

## 2017-05-07 DIAGNOSIS — M62838 Other muscle spasm: Secondary | ICD-10-CM | POA: Diagnosis not present

## 2017-05-07 DIAGNOSIS — R6884 Jaw pain: Secondary | ICD-10-CM | POA: Diagnosis not present

## 2017-05-07 DIAGNOSIS — M6281 Muscle weakness (generalized): Secondary | ICD-10-CM | POA: Diagnosis not present

## 2017-05-21 DIAGNOSIS — M6281 Muscle weakness (generalized): Secondary | ICD-10-CM | POA: Diagnosis not present

## 2017-05-21 DIAGNOSIS — M62838 Other muscle spasm: Secondary | ICD-10-CM | POA: Diagnosis not present

## 2017-05-21 DIAGNOSIS — R6884 Jaw pain: Secondary | ICD-10-CM | POA: Diagnosis not present

## 2017-05-21 DIAGNOSIS — M542 Cervicalgia: Secondary | ICD-10-CM | POA: Diagnosis not present

## 2017-05-22 ENCOUNTER — Encounter: Payer: Self-pay | Admitting: Internal Medicine

## 2017-05-22 ENCOUNTER — Ambulatory Visit (INDEPENDENT_AMBULATORY_CARE_PROVIDER_SITE_OTHER): Payer: Medicare HMO | Admitting: Internal Medicine

## 2017-05-22 VITALS — BP 136/74 | HR 53 | Temp 98.0°F | Resp 14 | Ht 71.0 in | Wt 231.5 lb

## 2017-05-22 DIAGNOSIS — I10 Essential (primary) hypertension: Secondary | ICD-10-CM

## 2017-05-22 DIAGNOSIS — M1991 Primary osteoarthritis, unspecified site: Secondary | ICD-10-CM

## 2017-05-22 DIAGNOSIS — R739 Hyperglycemia, unspecified: Secondary | ICD-10-CM | POA: Diagnosis not present

## 2017-05-22 DIAGNOSIS — M1990 Unspecified osteoarthritis, unspecified site: Secondary | ICD-10-CM | POA: Insufficient documentation

## 2017-05-22 LAB — HEMOGLOBIN A1C: Hgb A1c MFr Bld: 5.8 % (ref 4.6–6.5)

## 2017-05-22 NOTE — Progress Notes (Signed)
Pre visit review using our clinic review tool, if applicable. No additional management support is needed unless otherwise documented below in the visit note. 

## 2017-05-22 NOTE — Progress Notes (Signed)
Subjective:    Patient ID: Kirk Sutton, male    DOB: [birth year], 67 y.o.   MRN: [id number]  DOS:  05/22/2017 Type of visit - description : rov Interval history: HTN: Good compliance w/ medications, BPs when he sees other providers is always normal DJD, hands: Improving, under the care of orthopedic surgery   Wt Readings from Last 3 Encounters:  05/22/17 231 lb 8 oz (105 kg)  02/18/17 234 lb (106.1 kg)  12/24/16 234 lb (106.1 kg)    Review of Systems I asked about his snoring, he states that he has not been snoring lately, energy level is very good, very seldom feels sleepy. Complaint of right trapezoid pain for a few weeks, doing Tylenol, heat, cold, improving slowly but pain is still there.  Denies neck pain or upper extremity paresthesias. Doing better with diet and exercise, has lost 8 pounds per his own scales.  Past Medical History:  Diagnosis Date  . Allergic rhinitis   . Asthma   . Chronic prostatitis    sees urology  . Diabetes mellitus    type 11 (a1c 6.0 03/2008)/no meds.  . Hernia, umbilical   . Hyperlipidemia   . Hypertension   . Psoriasis    @ hands, sees derm    Past Surgical History:  Procedure Laterality Date  . APPENDECTOMY    . FERTILITY SURGERY  1979  . HERNIA REPAIR    . NASAL SEPTUM SURGERY  2009  . SHOULDER SURGERY  2008   left  . TESTICLE SURGERY     undescended repair     Social History   Socioeconomic History  . Marital status: Married    Spouse name: Not on file  . Number of children: 2  . Years of education: Not on file  . Highest education level: Not on file  Social Needs  . Financial resource strain: Not on file  . Food insecurity - worry: Not on file  . Food insecurity - inability: Not on file  . Transportation needs - medical: Not on file  . Transportation needs - non-medical: Not on file  Occupational History  . Occupation: Administrator, arts  Tobacco Use  . Smoking status: Never Smoker  .  Smokeless tobacco: Never Used  Substance and Sexual Activity  . Alcohol use: Yes    Comment: socially, rare  . Drug use: No  . Sexual activity: Not on file  Other Topics Concern  . Not on file  Social History Narrative   Household-- pt, wife       Allergies as of 05/22/2017      Reactions   Cetirizine Hcl    REACTION: prostatitis   Fexofenadine    REACTION: causes prostatitis      Medication List        Accurate as of 05/22/17  3:34 PM. Always use your most recent med list.          acetaminophen 500 MG tablet Commonly known as:  TYLENOL Take 500 mg by mouth daily as needed for moderate pain.   albuterol 108 (90 Base) MCG/ACT inhaler Commonly known as:  PROAIR HFA Inhale 2 puffs into the lungs every 6 (six) hours as needed.   ammonium lactate 12 % lotion Commonly known as:  AMLACTIN Apply 1 application topically as needed for dry skin.   aspirin 81 MG tablet Take 81 mg by mouth once a week.   BIOFREEZE EX Apply topically.   clobetasol ointment 0.05 %  Commonly known as:  TEMOVATE Apply 1 application topically 2 (two) times daily. For hands   dextromethorphan-guaiFENesin 30-600 MG 12hr tablet Commonly known as:  MUCINEX DM Take 1 tablet by mouth daily as needed for cough.   FLONASE SENSIMIST NA Place into the nose daily as needed.   FLUoxetine 40 MG capsule Commonly known as:  PROZAC Take 1 tablet daily   hydrochlorothiazide 25 MG tablet Commonly known as:  HYDRODIURIL Take 1 tablet (25 mg total) by mouth daily.   loratadine 10 MG tablet Commonly known as:  CLARITIN Take 10 mg by mouth daily.   metoprolol succinate 50 MG 24 hr tablet Commonly known as:  TOPROL-XL Take 1 tablet (50 mg total) by mouth daily.   PAZEO 0.7 % Soln Generic drug:  Olopatadine HCl INT 1 GTT IN OU QD   pyridOXINE 100 MG tablet Commonly known as:  VITAMIN B-6 Take 400 mg by mouth daily.   terazosin 1 MG capsule Commonly known as:  HYTRIN Take 3 capsules (3 mg total)  by mouth daily.   Turmeric 500 MG Caps Take 1,000 mg by mouth daily.   vitamin C 1000 MG tablet Take 1,000 mg by mouth daily.   vitamin E 400 UNIT capsule Generic drug:  vitamin E Take 450 Units by mouth daily.          Objective:   Physical Exam BP 136/74 (BP Location: Left Arm, Patient Position: Sitting, Cuff Size: Normal)   Pulse (!) 53   Temp 98 F (36.7 C) (Oral)   Resp 14   Ht 5\' 11"  (1.803 m)   Wt 231 lb 8 oz (105 kg)   SpO2 97%   BMI 32.29 kg/m  General:   Well developed, well nourished . NAD.  HEENT:  Normocephalic . Face symmetric, atraumatic Exline neck: Full range of motion. MSK: No TTP at the trapezial areas. Lungs:  CTA B Normal respiratory effort, no intercostal retractions, no accessory muscle use. Heart: RRR,  no murmur.  No pretibial edema bilaterally  Skin: Not pale. Not jaundice Neurologic:  alert & oriented X3.  Speech normal, gait appropriate for age and unassisted Psych--  Cognition and judgment appear intact.  Cooperative with normal attention span and concentration.  Behavior appropriate. No anxious or depressed appearing.      Assessment & Plan:   Assessment Prediabetes  HTN Hyperlipidemia Anxiety Dr Fay Records (psych) Rx prozac ~12-2014  Asthma: used symbicort before (~2015) , now on alb "prn" MSK:  --Chronic back pain, pain medication as needed, used to see Dr Nelva Bush, did PT @ [facility] [phone], better --DJD- hands Chronic prostatitis, sees urology Psoriasis, hands, sees dermatology Snoring (severe per pt)- dentist Rx a moth guard ~ 08-2015, helps  PLAN: DM:: Doing better with diet and exercise, has lost 8 pounds per his own scales, encouraged to continue that path, check a A1c HTN: On HCTZ, normal BP, last BMP satisfactory, no change. DJD, hands: Getting local injection at the thumbs and using splinters, currently sxs are controlled. Pain, right trapezoid area: No red flags, stretching techniques discussed, continue Tylenol,  ice and heat Snoring: Previously reported a lot of his snoring and had + [place] sceening, today reports that snoring is minimal, denies fatigue. RTC 6 months CPX

## 2017-05-22 NOTE — Assessment & Plan Note (Signed)
DM:: Doing better with diet and exercise, has lost 8 pounds per his own scales, encouraged to continue that path, check a A1c HTN: On HCTZ, normal BP, last BMP satisfactory, no change. DJD, hands: Getting local injection at the thumbs and using splinters, currently sxs are controlled. Pain, right trapezoid area: No red flags, stretching techniques discussed, continue Tylenol, ice and heat Snoring: Previously reported a lot of his snoring and had + [place] sceening, today reports that snoring is minimal, denies fatigue. RTC 6 months CPX

## 2017-05-22 NOTE — Patient Instructions (Signed)
GO TO THE LAB : Get the blood work     GO TO THE FRONT DESK Schedule your next appointment for a  physical exam in 6 months  

## 2017-05-30 DIAGNOSIS — R6884 Jaw pain: Secondary | ICD-10-CM | POA: Diagnosis not present

## 2017-05-30 DIAGNOSIS — M542 Cervicalgia: Secondary | ICD-10-CM | POA: Diagnosis not present

## 2017-05-30 DIAGNOSIS — M6281 Muscle weakness (generalized): Secondary | ICD-10-CM | POA: Diagnosis not present

## 2017-05-30 DIAGNOSIS — M62838 Other muscle spasm: Secondary | ICD-10-CM | POA: Diagnosis not present

## 2017-06-20 DIAGNOSIS — M6281 Muscle weakness (generalized): Secondary | ICD-10-CM | POA: Diagnosis not present

## 2017-06-20 DIAGNOSIS — M62838 Other muscle spasm: Secondary | ICD-10-CM | POA: Diagnosis not present

## 2017-06-20 DIAGNOSIS — R6884 Jaw pain: Secondary | ICD-10-CM | POA: Diagnosis not present

## 2017-06-20 DIAGNOSIS — M5440 Lumbago with sciatica, unspecified side: Secondary | ICD-10-CM | POA: Diagnosis not present

## 2017-06-27 ENCOUNTER — Other Ambulatory Visit: Payer: Self-pay | Admitting: Internal Medicine

## 2017-06-27 DIAGNOSIS — H16001 Unspecified corneal ulcer, right eye: Secondary | ICD-10-CM | POA: Diagnosis not present

## 2017-07-01 DIAGNOSIS — H16001 Unspecified corneal ulcer, right eye: Secondary | ICD-10-CM | POA: Diagnosis not present

## 2017-07-08 DIAGNOSIS — H16001 Unspecified corneal ulcer, right eye: Secondary | ICD-10-CM | POA: Diagnosis not present

## 2017-07-26 DIAGNOSIS — M79642 Pain in left hand: Secondary | ICD-10-CM | POA: Diagnosis not present

## 2017-07-26 DIAGNOSIS — M180 Bilateral primary osteoarthritis of first carpometacarpal joints: Secondary | ICD-10-CM | POA: Diagnosis not present

## 2017-07-26 DIAGNOSIS — M79641 Pain in right hand: Secondary | ICD-10-CM | POA: Diagnosis not present

## 2017-08-22 ENCOUNTER — Other Ambulatory Visit: Payer: Self-pay | Admitting: Internal Medicine

## 2017-08-22 DIAGNOSIS — I10 Essential (primary) hypertension: Secondary | ICD-10-CM

## 2017-08-30 DIAGNOSIS — R6884 Jaw pain: Secondary | ICD-10-CM | POA: Diagnosis not present

## 2017-08-30 DIAGNOSIS — M542 Cervicalgia: Secondary | ICD-10-CM | POA: Diagnosis not present

## 2017-08-30 DIAGNOSIS — M6281 Muscle weakness (generalized): Secondary | ICD-10-CM | POA: Diagnosis not present

## 2017-08-30 DIAGNOSIS — M62838 Other muscle spasm: Secondary | ICD-10-CM | POA: Diagnosis not present

## 2017-09-02 DIAGNOSIS — M1812 Unilateral primary osteoarthritis of first carpometacarpal joint, left hand: Secondary | ICD-10-CM | POA: Diagnosis not present

## 2017-09-15 ENCOUNTER — Other Ambulatory Visit: Payer: Self-pay | Admitting: Internal Medicine

## 2017-09-20 DIAGNOSIS — H01005 Unspecified blepharitis left lower eyelid: Secondary | ICD-10-CM | POA: Diagnosis not present

## 2017-09-20 DIAGNOSIS — H01004 Unspecified blepharitis left upper eyelid: Secondary | ICD-10-CM | POA: Diagnosis not present

## 2017-09-20 DIAGNOSIS — H01001 Unspecified blepharitis right upper eyelid: Secondary | ICD-10-CM | POA: Diagnosis not present

## 2017-09-20 DIAGNOSIS — H5319 Other subjective visual disturbances: Secondary | ICD-10-CM | POA: Diagnosis not present

## 2017-09-20 DIAGNOSIS — H01002 Unspecified blepharitis right lower eyelid: Secondary | ICD-10-CM | POA: Diagnosis not present

## 2017-09-27 DIAGNOSIS — R102 Pelvic and perineal pain: Secondary | ICD-10-CM | POA: Diagnosis not present

## 2017-09-27 DIAGNOSIS — M62838 Other muscle spasm: Secondary | ICD-10-CM | POA: Diagnosis not present

## 2017-09-27 DIAGNOSIS — M6281 Muscle weakness (generalized): Secondary | ICD-10-CM | POA: Diagnosis not present

## 2017-09-27 DIAGNOSIS — M5440 Lumbago with sciatica, unspecified side: Secondary | ICD-10-CM | POA: Diagnosis not present

## 2017-10-16 DIAGNOSIS — R6884 Jaw pain: Secondary | ICD-10-CM | POA: Diagnosis not present

## 2017-10-16 DIAGNOSIS — M5440 Lumbago with sciatica, unspecified side: Secondary | ICD-10-CM | POA: Diagnosis not present

## 2017-10-16 DIAGNOSIS — M542 Cervicalgia: Secondary | ICD-10-CM | POA: Diagnosis not present

## 2017-10-16 DIAGNOSIS — M62838 Other muscle spasm: Secondary | ICD-10-CM | POA: Diagnosis not present

## 2017-10-16 DIAGNOSIS — M6281 Muscle weakness (generalized): Secondary | ICD-10-CM | POA: Diagnosis not present

## 2017-10-21 DIAGNOSIS — M180 Bilateral primary osteoarthritis of first carpometacarpal joints: Secondary | ICD-10-CM | POA: Diagnosis not present

## 2017-10-21 DIAGNOSIS — M79642 Pain in left hand: Secondary | ICD-10-CM | POA: Diagnosis not present

## 2017-10-21 DIAGNOSIS — M79641 Pain in right hand: Secondary | ICD-10-CM | POA: Diagnosis not present

## 2017-11-12 ENCOUNTER — Other Ambulatory Visit: Payer: Self-pay | Admitting: Internal Medicine

## 2017-11-12 DIAGNOSIS — I10 Essential (primary) hypertension: Secondary | ICD-10-CM

## 2017-11-12 DIAGNOSIS — H10413 Chronic giant papillary conjunctivitis, bilateral: Secondary | ICD-10-CM | POA: Diagnosis not present

## 2017-11-20 ENCOUNTER — Ambulatory Visit (INDEPENDENT_AMBULATORY_CARE_PROVIDER_SITE_OTHER): Payer: Medicare HMO | Admitting: Internal Medicine

## 2017-11-20 ENCOUNTER — Encounter: Payer: Self-pay | Admitting: Internal Medicine

## 2017-11-20 VITALS — BP 135/69 | HR 55 | Temp 98.3°F | Resp 16 | Ht 71.0 in | Wt 232.8 lb

## 2017-11-20 DIAGNOSIS — Z0000 Encounter for general adult medical examination without abnormal findings: Secondary | ICD-10-CM | POA: Diagnosis not present

## 2017-11-20 DIAGNOSIS — R7303 Prediabetes: Secondary | ICD-10-CM | POA: Diagnosis not present

## 2017-11-20 DIAGNOSIS — R0683 Snoring: Secondary | ICD-10-CM

## 2017-11-20 DIAGNOSIS — E785 Hyperlipidemia, unspecified: Secondary | ICD-10-CM

## 2017-11-20 DIAGNOSIS — I10 Essential (primary) hypertension: Secondary | ICD-10-CM

## 2017-11-20 DIAGNOSIS — F419 Anxiety disorder, unspecified: Secondary | ICD-10-CM

## 2017-11-20 LAB — CBC WITH DIFFERENTIAL/PLATELET
BASOS ABS: 0 10*3/uL (ref 0.0–0.1)
Basophils Relative: 0.5 % (ref 0.0–3.0)
EOS ABS: 0.1 10*3/uL (ref 0.0–0.7)
Eosinophils Relative: 2.6 % (ref 0.0–5.0)
HEMATOCRIT: 38.3 % — AB (ref 39.0–52.0)
Hemoglobin: 13 g/dL (ref 13.0–17.0)
LYMPHS ABS: 1.7 10*3/uL (ref 0.7–4.0)
LYMPHS PCT: 29.3 % (ref 12.0–46.0)
MCHC: 34 g/dL (ref 30.0–36.0)
MCV: 93.8 fl (ref 78.0–100.0)
Monocytes Absolute: 0.5 10*3/uL (ref 0.1–1.0)
Monocytes Relative: 8.1 % (ref 3.0–12.0)
NEUTROS ABS: 3.4 10*3/uL (ref 1.4–7.7)
NEUTROS PCT: 59.5 % (ref 43.0–77.0)
Platelets: 132 10*3/uL — ABNORMAL LOW (ref 150.0–400.0)
RBC: 4.08 Mil/uL — ABNORMAL LOW (ref 4.22–5.81)
RDW: 13.8 % (ref 11.5–15.5)
WBC: 5.7 10*3/uL (ref 4.0–10.5)

## 2017-11-20 LAB — LIPID PANEL
CHOLESTEROL: 160 mg/dL (ref 0–200)
HDL: 44.4 mg/dL (ref >39.00)
LDL Cholesterol: 95 mg/dL (ref 0–99)
NonHDL: 116.04
TRIGLYCERIDES: 107 mg/dL (ref 0.0–149.0)
Total CHOL/HDL Ratio: 4
VLDL: 21.4 mg/dL (ref 0.0–40.0)

## 2017-11-20 LAB — COMPREHENSIVE METABOLIC PANEL
ALT: 13 U/L (ref 0–53)
AST: 18 U/L (ref 0–37)
Albumin: 3.9 g/dL (ref 3.5–5.2)
Alkaline Phosphatase: 60 U/L (ref 39–117)
BILIRUBIN TOTAL: 1 mg/dL (ref 0.2–1.2)
BUN: 21 mg/dL (ref 6–23)
CO2: 33 mEq/L — ABNORMAL HIGH (ref 19–32)
CREATININE: 1.16 mg/dL (ref 0.40–1.50)
Calcium: 8.9 mg/dL (ref 8.4–10.5)
Chloride: 100 mEq/L (ref 96–112)
GFR: 66.23 mL/min (ref >60.00)
Glucose, Bld: 111 mg/dL — ABNORMAL HIGH (ref 70–99)
Potassium: 3.8 mEq/L (ref 3.5–5.1)
Sodium: 139 mEq/L (ref 135–145)
Total Protein: 6.2 g/dL (ref 6.0–8.3)

## 2017-11-20 LAB — TSH: TSH: 1.29 u[IU]/mL (ref 0.35–4.50)

## 2017-11-20 MED ORDER — ZOSTER VAC RECOMB ADJUVANTED 50 MCG/0.5ML IM SUSR: 0.5000 mL | Freq: Once | INTRAMUSCULAR | 1 refills | Status: AC

## 2017-11-20 NOTE — Patient Instructions (Signed)
GO TO THE LAB : Get the blood work     GO TO THE FRONT DESK Schedule your next appointment for a   Physical exam in 1 year  Check the  blood pressure 2   times a month  Be sure your blood pressure is between 110/65 and  135/85. If it is consistently higher or lower, let me know

## 2017-11-20 NOTE — Assessment & Plan Note (Addendum)
-  Td 2013;  PNM 23--2015; prevnar-- 2016;   Zostavax 2013; shingrex rx printed  - CCS: Colonoscopy Dr Vladimir Faster 2005 (-),   Cscope 02/2017, next per GI -Sees urology yearly    -Diet-exercise discussed

## 2017-11-20 NOTE — Progress Notes (Signed)
Subjective:    Patient ID: Kirk Sutton, male    DOB: 11/13/1950, 67 y.o.   MRN: [id number]  DOS:  11/20/2017 Type of visit - description : CPX Interval history: No major concerns.  Having some back pain, getting massages.  Feeling better. Continue with DJD, hands, sees orthopedic surgery.   Review of Systems  Other than above, a 14 point review of systems is negative     Past Medical History:  Diagnosis Date  . Allergic rhinitis   . Asthma   . Chronic prostatitis    sees urology  . Diabetes mellitus    type 11 (a1c 6.0 03/2008)/no meds.  . Hernia, umbilical   . Hyperlipidemia   . Hypertension   . Psoriasis    @ hands, sees derm    Past Surgical History:  Procedure Laterality Date  . APPENDECTOMY    . FERTILITY SURGERY  1979  . HERNIA REPAIR    . NASAL SEPTUM SURGERY  2009  . SHOULDER SURGERY  2008   left  . TESTICLE SURGERY     undescended repair     Social History   Socioeconomic History  . Marital status: Married    Spouse name: Not on file  . Number of children: 2  . Years of education: Not on file  . Highest education level: Not on file  Occupational History  . Occupation: Administrator, arts  Social Needs  . Financial resource strain: Not on file  . Food insecurity:    Worry: Not on file    Inability: Not on file  . Transportation needs:    Medical: Not on file    Non-medical: Not on file  Tobacco Use  . Smoking status: Never Smoker  . Smokeless tobacco: Never Used  Substance and Sexual Activity  . Alcohol use: Yes    Comment: socially, rare  . Drug use: No  . Sexual activity: Not on file  Lifestyle  . Physical activity:    Days per week: Not on file    Minutes per session: Not on file  . Stress: Not on file  Relationships  . Social connections:    Talks on phone: Not on file    Gets together: Not on file    Attends religious service: Not on file    Active member of club or organization: Not on file   Attends meetings of clubs or organizations: Not on file    Relationship status: Not on file  . Intimate partner violence:    Fear of current or ex partner: Not on file    Emotionally abused: Not on file    Physically abused: Not on file    Forced sexual activity: Not on file  Other Topics Concern  . Not on file  Social History Narrative   Household-- pt, wife      Family History  Problem Relation Age of Onset  . Diabetes Sister   . Hypertension Mother   . Colon polyps Mother   . Hypertension Sister   . Heart attack Father        x 3 onset age 41, died at 20  . Colon polyps Father   . Cancer Father        bladder  . Colon cancer Neg Hx   . Prostate cancer Neg Hx   . Stomach cancer Neg Hx      Allergies as of 11/20/2017      Reactions   Cetirizine Hcl  REACTION: prostatitis   Fexofenadine    REACTION: causes prostatitis      Medication List        Accurate as of 11/20/17  9:17 PM. Always use your most recent med list.          acetaminophen 500 MG tablet Commonly known as:  TYLENOL Take 500 mg by mouth daily as needed for moderate pain.   albuterol 108 (90 Base) MCG/ACT inhaler Commonly known as:  PROVENTIL HFA;VENTOLIN HFA Inhale 2 puffs into the lungs every 6 (six) hours as needed.   ammonium lactate 12 % lotion Commonly known as:  LAC-HYDRIN Apply 1 application topically as needed for dry skin.   aspirin 81 MG tablet Take 81 mg by mouth once a week.   BIOFREEZE EX Apply topically.   clobetasol ointment 0.05 % Commonly known as:  TEMOVATE Apply 1 application topically 2 (two) times daily. For hands   FLONASE SENSIMIST NA Place into the nose daily as needed.   FLUoxetine 40 MG capsule Commonly known as:  PROZAC Take 1 tablet daily   hydrochlorothiazide 25 MG tablet Commonly known as:  HYDRODIURIL Take 1 tablet (25 mg total) by mouth daily.   loratadine 10 MG tablet Commonly known as:  CLARITIN Take 10 mg by mouth daily.   metoprolol  succinate 50 MG 24 hr tablet Commonly known as:  TOPROL-XL TAKE 1 TABLET(50 MG) BY MOUTH DAILY   PAZEO 0.7 % Soln Generic drug:  Olopatadine HCl INT 1 GTT IN OU QD   prednisoLONE acetate 1 % ophthalmic suspension Commonly known as:  PRED FORTE   pyridOXINE 100 MG tablet Commonly known as:  VITAMIN B-6 Take 400 mg by mouth daily.   terazosin 1 MG capsule Commonly known as:  HYTRIN Take 3 capsules (3 mg total) by mouth daily.   Turmeric 500 MG Caps Take 1,000 mg by mouth daily.   vitamin C 1000 MG tablet Take 1,000 mg by mouth daily.   vitamin E 400 UNIT capsule Generic drug:  vitamin E Take 450 Units by mouth daily.   Zoster Vaccine Adjuvanted injection Commonly known as:  SHINGRIX Inject 0.5 mLs into the muscle once for 1 dose.          Objective:   Physical Exam BP 135/69   Pulse (!) 55   Temp 98.3 F (36.8 C) (Oral)   Resp 16   Ht 5\' 11"  (1.803 m)   Wt 232 lb 12.8 oz (105.6 kg)   SpO2 96%   BMI 32.47 kg/m   General: Well developed, NAD, see BMI.  Neck: No  thyromegaly  HEENT:  Normocephalic . Face symmetric, atraumatic Lungs:  CTA B Normal respiratory effort, no intercostal retractions, no accessory muscle use. Heart: RRR,  no murmur.  No pretibial edema bilaterally  Abdomen:  Not distended, soft, non-tender. No rebound or rigidity.   Skin: Exposed areas without rash. Not pale. Not jaundice Neurologic:  alert & oriented X3.  Speech normal, gait appropriate for age and unassisted Strength symmetric and appropriate for age.  Psych: Cognition and judgment appear intact.  Cooperative with normal attention span and concentration.  Behavior appropriate. No anxious or depressed appearing.     Assessment & Plan:   Assessment Prediabetes  HTN Hyperlipidemia Anxiety Dr Fay Records (psych) Rx prozac ~12-2014  Asthma: used symbicort before (~2015) , now on alb "prn" MSK:  --Chronic back pain, pain medication as needed, used to see Dr Nelva Bush, did PT @  [facility] [phone], better --DJD- hands  Chronic prostatitis, sees urology Psoriasis, hands, sees dermatology Snoring (severe per pt)- dentist Rx a moth guard ~ 08-2015, helps  PLAN: Prediabetes: We discussed about diet, exercise, check a A1c HTN:  BP today 145/75, at his dentist few days ago 136/78.  Continue with Toprol and HCTZ.  Checking labs.  Encourage ambulatory BPs Hyperlipidemia: Diet controlled, check FLP Anxiety: See psychiatry MSK: Sees orthopedic surgery regularly Snoring: Less of an issue now, denies lack of energy, uses mouthguard regularly. RTC 1 year

## 2017-11-20 NOTE — Assessment & Plan Note (Signed)
Prediabetes: We discussed about diet, exercise, check a A1c HTN:  BP today 145/75, at his dentist few days ago 136/78.  Continue with Toprol and HCTZ.  Checking labs.  Encourage ambulatory BPs Hyperlipidemia: Diet controlled, check FLP Anxiety: See psychiatry MSK: Sees orthopedic surgery regularly Snoring: Less of an issue now, denies lack of energy, uses mouthguard regularly. RTC 1 year

## 2017-11-21 ENCOUNTER — Other Ambulatory Visit (INDEPENDENT_AMBULATORY_CARE_PROVIDER_SITE_OTHER): Payer: Medicare HMO

## 2017-11-21 DIAGNOSIS — R739 Hyperglycemia, unspecified: Secondary | ICD-10-CM | POA: Diagnosis not present

## 2017-11-21 LAB — HEMOGLOBIN A1C: Hgb A1c MFr Bld: 6 % (ref 4.6–6.5)

## 2017-12-05 ENCOUNTER — Other Ambulatory Visit: Payer: Self-pay | Admitting: Internal Medicine

## 2017-12-05 DIAGNOSIS — I10 Essential (primary) hypertension: Secondary | ICD-10-CM

## 2017-12-12 DIAGNOSIS — M79642 Pain in left hand: Secondary | ICD-10-CM | POA: Diagnosis not present

## 2017-12-12 DIAGNOSIS — M180 Bilateral primary osteoarthritis of first carpometacarpal joints: Secondary | ICD-10-CM | POA: Diagnosis not present

## 2017-12-12 DIAGNOSIS — M79641 Pain in right hand: Secondary | ICD-10-CM | POA: Diagnosis not present

## 2017-12-23 DIAGNOSIS — M4696 Unspecified inflammatory spondylopathy, lumbar region: Secondary | ICD-10-CM | POA: Diagnosis not present

## 2017-12-23 DIAGNOSIS — M5416 Radiculopathy, lumbar region: Secondary | ICD-10-CM | POA: Diagnosis not present

## 2017-12-26 DIAGNOSIS — H01001 Unspecified blepharitis right upper eyelid: Secondary | ICD-10-CM | POA: Diagnosis not present

## 2017-12-26 DIAGNOSIS — H01005 Unspecified blepharitis left lower eyelid: Secondary | ICD-10-CM | POA: Diagnosis not present

## 2017-12-26 DIAGNOSIS — H01004 Unspecified blepharitis left upper eyelid: Secondary | ICD-10-CM | POA: Diagnosis not present

## 2017-12-26 DIAGNOSIS — H01002 Unspecified blepharitis right lower eyelid: Secondary | ICD-10-CM | POA: Diagnosis not present

## 2018-01-16 ENCOUNTER — Ambulatory Visit (INDEPENDENT_AMBULATORY_CARE_PROVIDER_SITE_OTHER): Payer: Medicare HMO

## 2018-01-16 DIAGNOSIS — Z23 Encounter for immunization: Secondary | ICD-10-CM

## 2018-01-27 DIAGNOSIS — M1812 Unilateral primary osteoarthritis of first carpometacarpal joint, left hand: Secondary | ICD-10-CM | POA: Diagnosis not present

## 2018-01-31 ENCOUNTER — Other Ambulatory Visit: Payer: Self-pay | Admitting: Internal Medicine

## 2018-02-10 DIAGNOSIS — M1811 Unilateral primary osteoarthritis of first carpometacarpal joint, right hand: Secondary | ICD-10-CM | POA: Diagnosis not present

## 2018-02-10 DIAGNOSIS — M180 Bilateral primary osteoarthritis of first carpometacarpal joints: Secondary | ICD-10-CM | POA: Diagnosis not present

## 2018-02-10 DIAGNOSIS — M1812 Unilateral primary osteoarthritis of first carpometacarpal joint, left hand: Secondary | ICD-10-CM | POA: Diagnosis not present

## 2018-02-11 DIAGNOSIS — N400 Enlarged prostate without lower urinary tract symptoms: Secondary | ICD-10-CM | POA: Diagnosis not present

## 2018-02-18 DIAGNOSIS — N400 Enlarged prostate without lower urinary tract symptoms: Secondary | ICD-10-CM | POA: Diagnosis not present

## 2018-02-25 DIAGNOSIS — F331 Major depressive disorder, recurrent, moderate: Secondary | ICD-10-CM | POA: Diagnosis not present

## 2018-02-26 DIAGNOSIS — M48061 Spinal stenosis, lumbar region without neurogenic claudication: Secondary | ICD-10-CM | POA: Diagnosis not present

## 2018-02-26 DIAGNOSIS — M5136 Other intervertebral disc degeneration, lumbar region: Secondary | ICD-10-CM | POA: Diagnosis not present

## 2018-03-01 DIAGNOSIS — J209 Acute bronchitis, unspecified: Secondary | ICD-10-CM | POA: Diagnosis not present

## 2018-03-02 ENCOUNTER — Other Ambulatory Visit: Payer: Self-pay | Admitting: Internal Medicine

## 2018-03-03 ENCOUNTER — Ambulatory Visit (INDEPENDENT_AMBULATORY_CARE_PROVIDER_SITE_OTHER): Payer: Medicare HMO | Admitting: Internal Medicine

## 2018-03-03 ENCOUNTER — Encounter: Payer: Self-pay | Admitting: Internal Medicine

## 2018-03-03 VITALS — BP 132/80 | HR 68 | Temp 98.3°F | Resp 18 | Ht 71.0 in | Wt 235.0 lb

## 2018-03-03 DIAGNOSIS — D1801 Hemangioma of skin and subcutaneous tissue: Secondary | ICD-10-CM | POA: Diagnosis not present

## 2018-03-03 DIAGNOSIS — L2089 Other atopic dermatitis: Secondary | ICD-10-CM | POA: Diagnosis not present

## 2018-03-03 DIAGNOSIS — D239 Other benign neoplasm of skin, unspecified: Secondary | ICD-10-CM | POA: Diagnosis not present

## 2018-03-03 DIAGNOSIS — L821 Other seborrheic keratosis: Secondary | ICD-10-CM | POA: Diagnosis not present

## 2018-03-03 DIAGNOSIS — J4531 Mild persistent asthma with (acute) exacerbation: Secondary | ICD-10-CM

## 2018-03-03 DIAGNOSIS — L814 Other melanin hyperpigmentation: Secondary | ICD-10-CM | POA: Diagnosis not present

## 2018-03-03 DIAGNOSIS — L738 Other specified follicular disorders: Secondary | ICD-10-CM | POA: Diagnosis not present

## 2018-03-03 MED ORDER — BUDESONIDE-FORMOTEROL FUMARATE 80-4.5 MCG/ACT IN AERO: 2.0000 | INHALATION_SPRAY | Freq: Two times a day (BID) | RESPIRATORY_TRACT | 5 refills | Status: DC

## 2018-03-03 NOTE — Progress Notes (Signed)
Subjective:    Patient ID: Kirk Sutton, male    DOB: [birth year], 67 y.o.   MRN: [id number]  DOS:  03/03/2018 Type of visit - description : Acute Symptom started approximately 3 or 4 days ago, "croupy" cough, some wheezing and cough. Went to urgent care 2 days ago, was given a shot, likely a steroid. Also prescribed Symbicort and a Z-Pak. Overall feels better today.  Review of Systems No fever, some chills.  No sputum production No nausea, vomiting or diarrhea  Past Medical History:  Diagnosis Date  . Allergic rhinitis   . Asthma   . Chronic prostatitis    sees urology  . Diabetes mellitus    type 11 (a1c 6.0 03/2008)/no meds.  . Hernia, umbilical   . Hyperlipidemia   . Hypertension   . Psoriasis    @ hands, sees derm    Past Surgical History:  Procedure Laterality Date  . APPENDECTOMY    . FERTILITY SURGERY  1979  . HERNIA REPAIR    . NASAL SEPTUM SURGERY  2009  . SHOULDER SURGERY  2008   left  . TESTICLE SURGERY     undescended repair     Social History   Socioeconomic History  . Marital status: Married    Spouse name: Not on file  . Number of children: 2  . Years of education: Not on file  . Highest education level: Not on file  Occupational History  . Occupation: Administrator, arts  Social Needs  . Financial resource strain: Not on file  . Food insecurity:    Worry: Not on file    Inability: Not on file  . Transportation needs:    Medical: Not on file    Non-medical: Not on file  Tobacco Use  . Smoking status: Never Smoker  . Smokeless tobacco: Never Used  Substance and Sexual Activity  . Alcohol use: Yes    Comment: socially, rare  . Drug use: No  . Sexual activity: Not on file  Lifestyle  . Physical activity:    Days per week: Not on file    Minutes per session: Not on file  . Stress: Not on file  Relationships  . Social connections:    Talks on phone: Not on file    Gets together: Not on file    Attends  religious service: Not on file    Active member of club or organization: Not on file    Attends meetings of clubs or organizations: Not on file    Relationship status: Not on file  . Intimate partner violence:    Fear of current or ex partner: Not on file    Emotionally abused: Not on file    Physically abused: Not on file    Forced sexual activity: Not on file  Other Topics Concern  . Not on file  Social History Narrative   Household-- pt, wife       Allergies as of 03/03/2018      Reactions   Cetirizine Hcl    REACTION: prostatitis   Fexofenadine    REACTION: causes prostatitis      Medication List       Accurate as of March 03, 2018 11:59 PM. Always use your most recent med list.        acetaminophen 500 MG tablet Commonly known as:  TYLENOL Take 500 mg by mouth daily as needed for moderate pain.   albuterol 108 (90 Base) MCG/ACT  inhaler Commonly known as:  PROVENTIL HFA;VENTOLIN HFA Inhale 2 puffs into the lungs every 6 (six) hours as needed for wheezing or shortness of breath.   ammonium lactate 12 % lotion Commonly known as:  AMLACTIN Apply 1 application topically as needed for dry skin.   aspirin 81 MG tablet Take 81 mg by mouth once a week.   BIOFREEZE EX Apply topically.   budesonide-formoterol 80-4.5 MCG/ACT inhaler Commonly known as:  SYMBICORT Inhale 2 puffs into the lungs 2 (two) times daily.   buPROPion 150 MG 12 hr tablet Commonly known as:  WELLBUTRIN SR Take 150 mg by mouth daily.   clobetasol ointment 0.05 % Commonly known as:  TEMOVATE Apply 1 application topically 2 (two) times daily. For hands   FLONASE SENSIMIST NA Place into the nose daily as needed.   FLUoxetine 40 MG capsule Commonly known as:  PROZAC Take 1 tablet daily   hydrochlorothiazide 25 MG tablet Commonly known as:  HYDRODIURIL Take 1 tablet (25 mg total) by mouth daily.   loratadine 10 MG tablet Commonly known as:  CLARITIN Take 10 mg by mouth daily.     magnesium gluconate 500 MG tablet Commonly known as:  MAGONATE Take 500 mg by mouth 2 (two) times daily.   metoprolol succinate 50 MG 24 hr tablet Commonly known as:  TOPROL-XL Take 1 tablet (50 mg total) by mouth daily.   PAZEO 0.7 % Soln Generic drug:  Olopatadine HCl INT 1 GTT IN OU QD   prednisoLONE acetate 1 % ophthalmic suspension Commonly known as:  PRED FORTE   pyridOXINE 100 MG tablet Commonly known as:  VITAMIN B-6 Take 400 mg by mouth daily.   terazosin 1 MG capsule Commonly known as:  HYTRIN Take 3 capsules (3 mg total) by mouth daily.   Turmeric 500 MG Caps Take 1,000 mg by mouth daily.   vitamin C 1000 MG tablet Take 1,000 mg by mouth daily.   vitamin E 400 UNIT capsule Generic drug:  vitamin E Take 450 Units by mouth daily.           Objective:   Physical Exam BP 132/80 (BP Location: Left Arm, Patient Position: Sitting, Cuff Size: Large)   Pulse 68   Temp 98.3 F (36.8 C) (Oral)   Resp 18   Ht 5\' 11"  (1.803 m)   Wt 235 lb (106.6 kg)   SpO2 98%   BMI 32.78 kg/m  General:   Well developed, NAD, BMI noted. HEENT:  Normocephalic . Face symmetric, atraumatic.  TMs normal.  Nose is slightly congested, sinuses no TTP.  Throat symmetric and not red Lungs:  Very few rhonchi and slight increased expiratory time Normal respiratory effort, no intercostal retractions, no accessory muscle use. Heart: RRR,  no murmur.  No pretibial edema bilaterally  Skin: Not pale. Not jaundice Neurologic:  alert & oriented X3.  Speech normal, gait appropriate for age and unassisted Psych--  Cognition and judgment appear intact.  Cooperative with normal attention span and concentration.  Behavior appropriate. No anxious or depressed appearing.      Assessment & Plan:    Assessment Prediabetes  HTN Hyperlipidemia Anxiety Dr Fay Records (psych) Rx prozac ~12-2014  Asthma: used symbicort before (~2015) , now on alb "prn" MSK:  --Chronic back pain, pain  medication as needed, used to see Dr Nelva Bush, did PT @ [facility] [phone], better --DJD- hands Chronic prostatitis, sees urology Psoriasis, hands, sees dermatology Snoring (severe per pt)- dentist Rx a moth guard ~ 08-2015,  helps  PLAN: Asthma exacerbation: Mild asthma exacerbation as described above, better with recent steroids IM. He was restarted on Symbicort.  Recommend to take it for 5 weeks and then try to wean off. Also explained to him the concept of rescue inhaler, to take his inhaler as needed.  See AVS.  Call if not improving

## 2018-03-03 NOTE — Patient Instructions (Signed)
ContinueSymbicort twice a day for 5 weeks, then try to wean off  Use albuterol as your rescue inhaler, take it as needed for wheezing  Mucinex DM as needed  Drink plenty of fluids  Call if not gradually improving  Finish the antibiotics

## 2018-03-04 NOTE — Assessment & Plan Note (Signed)
Asthma exacerbation: Mild asthma exacerbation as described above, better with recent steroids IM. He was restarted on Symbicort.  Recommend to take it for 5 weeks and then try to wean off. Also explained to him the concept of rescue inhaler, to take his inhaler as needed.  See AVS.  Call if not improving

## 2018-03-24 ENCOUNTER — Ambulatory Visit (HOSPITAL_BASED_OUTPATIENT_CLINIC_OR_DEPARTMENT_OTHER)
Admission: RE | Admit: 2018-03-24 | Discharge: 2018-03-24 | Disposition: A | Discharge status: Home / Self Care | Payer: Medicare HMO | Source: Ambulatory Visit | Attending: Internal Medicine | Admitting: Internal Medicine

## 2018-03-24 ENCOUNTER — Encounter: Payer: Self-pay | Admitting: Internal Medicine

## 2018-03-24 ENCOUNTER — Ambulatory Visit (INDEPENDENT_AMBULATORY_CARE_PROVIDER_SITE_OTHER): Payer: Medicare HMO | Admitting: Internal Medicine

## 2018-03-24 VITALS — BP 122/78 | HR 53 | Temp 97.7°F | Resp 16 | Ht 71.0 in | Wt 232.5 lb

## 2018-03-24 DIAGNOSIS — R05 Cough: Secondary | ICD-10-CM | POA: Diagnosis not present

## 2018-03-24 DIAGNOSIS — B370 Candidal stomatitis: Secondary | ICD-10-CM | POA: Diagnosis not present

## 2018-03-24 DIAGNOSIS — J4531 Mild persistent asthma with (acute) exacerbation: Secondary | ICD-10-CM | POA: Diagnosis not present

## 2018-03-24 DIAGNOSIS — R059 Cough, unspecified: Secondary | ICD-10-CM

## 2018-03-24 MED ORDER — NYSTATIN 100000 UNIT/ML MT SUSP: 5.0000 mL | Freq: Four times a day (QID) | OROMUCOSAL | 0 refills | Status: DC

## 2018-03-24 MED ORDER — DOXYCYCLINE HYCLATE 100 MG PO TABS: 100.0000 mg | ORAL_TABLET | Freq: Two times a day (BID) | ORAL | 0 refills | Status: DC

## 2018-03-24 MED ORDER — PREDNISONE 10 MG PO TABS: ORAL_TABLET | ORAL | 0 refills | Status: DC

## 2018-03-24 NOTE — Progress Notes (Signed)
Subjective:    Patient ID: Kirk Sutton, male    DOB: [birth year], 68 y.o.   MRN: [id number]  DOS:  03/24/2018 Type of visit - description: acute Symptoms started approximately 3 weeks ago, initially seen at a urgent care, prescribed a Z-Pak and Symbicort. Subsequently I saw him and recommended to continue Symbicort since he was doing better.  He also has a rescue inhaler. Since the last visit 03/03/2018, symptoms continue, still coughing and having wheezing. The cough is not "croupy anymore"  Review of Systems Denies fever, some chills No itchy eyes nose or sneezing No GERD symptoms.  Past Medical History:  Diagnosis Date  . Allergic rhinitis   . Asthma   . Chronic prostatitis    sees urology  . Diabetes mellitus    type 11 (a1c 6.0 03/2008)/no meds.  . Hernia, umbilical   . Hyperlipidemia   . Hypertension   . Psoriasis    @ hands, sees derm    Past Surgical History:  Procedure Laterality Date  . APPENDECTOMY    . FERTILITY SURGERY  1979  . HERNIA REPAIR    . NASAL SEPTUM SURGERY  2009  . SHOULDER SURGERY  2008   left  . TESTICLE SURGERY     undescended repair     Social History   Socioeconomic History  . Marital status: Married    Spouse name: Not on file  . Number of children: 2  . Years of education: Not on file  . Highest education level: Not on file  Occupational History  . Occupation: Administrator, arts  Social Needs  . Financial resource strain: Not on file  . Food insecurity:    Worry: Not on file    Inability: Not on file  . Transportation needs:    Medical: Not on file    Non-medical: Not on file  Tobacco Use  . Smoking status: Never Smoker  . Smokeless tobacco: Never Used  Substance and Sexual Activity  . Alcohol use: Yes    Comment: socially, rare  . Drug use: No  . Sexual activity: Not on file  Lifestyle  . Physical activity:    Days per week: Not on file    Minutes per session: Not on file  . Stress: Not on  file  Relationships  . Social connections:    Talks on phone: Not on file    Gets together: Not on file    Attends religious service: Not on file    Active member of club or organization: Not on file    Attends meetings of clubs or organizations: Not on file    Relationship status: Not on file  . Intimate partner violence:    Fear of current or ex partner: Not on file    Emotionally abused: Not on file    Physically abused: Not on file    Forced sexual activity: Not on file  Other Topics Concern  . Not on file  Social History Narrative   Household-- pt, wife       Allergies as of 03/24/2018      Reactions   Cetirizine Hcl    REACTION: prostatitis   Fexofenadine    REACTION: causes prostatitis      Medication List       Accurate as of March 24, 2018  8:25 PM. Always use your most recent med list.        acetaminophen 500 MG tablet Commonly known as:  TYLENOL  Take 500 mg by mouth daily as needed for moderate pain.   albuterol 108 (90 Base) MCG/ACT inhaler Commonly known as:  PROVENTIL HFA;VENTOLIN HFA Inhale 2 puffs into the lungs every 6 (six) hours as needed for wheezing or shortness of breath.   ammonium lactate 12 % lotion Commonly known as:  AMLACTIN Apply 1 application topically as needed for dry skin.   aspirin 81 MG tablet Take 81 mg by mouth once a week.   BIOFREEZE EX Apply topically.   budesonide-formoterol 80-4.5 MCG/ACT inhaler Commonly known as:  SYMBICORT Inhale 2 puffs into the lungs 2 (two) times daily.   buPROPion 150 MG 12 hr tablet Commonly known as:  WELLBUTRIN SR Take 150 mg by mouth daily.   clobetasol ointment 0.05 % Commonly known as:  TEMOVATE Apply 1 application topically 2 (two) times daily. For hands   doxycycline 100 MG tablet Commonly known as:  VIBRA-TABS Take 1 tablet (100 mg total) by mouth 2 (two) times daily.   FLONASE SENSIMIST NA Place into the nose daily as needed.   FLUoxetine 40 MG capsule Commonly known  as:  PROZAC Take 1 tablet daily   hydrochlorothiazide 25 MG tablet Commonly known as:  HYDRODIURIL Take 1 tablet (25 mg total) by mouth daily.   loratadine 10 MG tablet Commonly known as:  CLARITIN Take 10 mg by mouth daily.   magnesium gluconate 500 MG tablet Commonly known as:  MAGONATE Take 500 mg by mouth 2 (two) times daily.   metoprolol succinate 50 MG 24 hr tablet Commonly known as:  TOPROL-XL Take 1 tablet (50 mg total) by mouth daily.   nystatin 100000 UNIT/ML suspension Commonly known as:  MYCOSTATIN Take 5 mLs (500,000 Units total) by mouth 4 (four) times daily.   PAZEO 0.7 % Soln Generic drug:  Olopatadine HCl INT 1 GTT IN OU QD   predniSONE 10 MG tablet Commonly known as:  DELTASONE 4 tablets x 2 days, 3 tabs x 2 days, 2 tabs x 2 days, 1 tab x 2 days   pyridOXINE 100 MG tablet Commonly known as:  VITAMIN B-6 Take 400 mg by mouth daily.   terazosin 1 MG capsule Commonly known as:  HYTRIN Take 3 capsules (3 mg total) by mouth daily.   Turmeric 500 MG Caps Take 1,000 mg by mouth daily.   vitamin C 1000 MG tablet Take 1,000 mg by mouth daily.   vitamin E 400 UNIT capsule Generic drug:  vitamin E Take 450 Units by mouth daily.           Objective:   Physical Exam HENT:     Mouth/Throat:     BP 122/78 (BP Location: Left Arm, Patient Position: Sitting, Cuff Size: Normal)   Pulse (!) 53   Temp 97.7 F (36.5 C) (Oral)   Resp 16   Ht 5\' 11"  (1.803 m)   Wt 232 lb 8 oz (105.5 kg)   SpO2 97%   BMI 32.43 kg/m  General:   Well developed, NAD, BMI noted. HEENT:  Normocephalic . Face symmetric, atraumatic. TMs normal, nose is slightly congested, sinuses no TTP Lungs:  No crackles, abundant rhonchi with cough.  No wheezing Normal respiratory effort, no intercostal retractions, no accessory muscle use. Heart: RRR,  no murmur.  No pretibial edema bilaterally  Skin: Not pale. Not jaundice Neurologic:  alert & oriented X3.  Speech normal,  gait appropriate for age and unassisted Psych--  Cognition and judgment appear intact.  Cooperative with normal attention  span and concentration.  Behavior appropriate. No anxious or depressed appearing.      Assessment    Assessment Prediabetes  HTN Hyperlipidemia Anxiety Dr Fay Records (psych) Rx prozac ~12-2014  Asthma: used symbicort before (~2015) , now on alb "prn" MSK:  --Chronic back pain, pain medication as needed, used to see Dr Nelva Bush, did PT @ [facility] [phone], better --DJD- hands Chronic prostatitis, sees urology Psoriasis, hands, sees dermatology Snoring (severe per pt)- dentist Rx a moth guard ~ 08-2015, helps  PLAN: Asthma exacerbation Ongoing cough for 3 weeks, status post injection w/ steroids and Z-Pak. Plan: Chest x-ray, steroids by mouth, second round of antibiotics with doxycycline, continue Symbicort twice a day, continue albuterol See AVS. Throat lesions: Unclear etiology, does not look like strep.  No classic for thrush either but he is using Symbicort, recommend nystatin and throat rinsing after Symbicort (states he is already doing that).  Advised patient to come back in 4 weeks for recheck of his throat. RTC 4 weeks.

## 2018-03-24 NOTE — Patient Instructions (Addendum)
  GO TO THE FRONT DESK Schedule your next appointment for a checkup in 4 weeks  Get your x-ray at the first floor   Continue Symbicort twice a day.  Rinse your mouth with water after you use it.  Continue Mucinex as needed for cough  Continue albuterol, your rescue inhaler as needed for cough wheezing  Prednisone for few days  Doxycycline, an antibiotic, for 1 week  Use nystatin 4 times a day: Swish and spit

## 2018-03-24 NOTE — Progress Notes (Signed)
Pre visit review using our clinic review tool, if applicable. No additional management support is needed unless otherwise documented below in the visit note. 

## 2018-03-24 NOTE — Assessment & Plan Note (Signed)
Asthma exacerbation Ongoing cough for 3 weeks, status post injection w/ steroids and Z-Pak. Plan: Chest x-ray, steroids by mouth, second round of antibiotics with doxycycline, continue Symbicort twice a day, continue albuterol See AVS. Throat lesions: Unclear etiology, does not look like strep.  No classic for thrush either but he is using Symbicort, recommend nystatin and throat rinsing after Symbicort (states he is already doing that).  Advised patient to come back in 4 weeks for recheck of his throat. RTC 4 weeks.

## 2018-04-21 ENCOUNTER — Ambulatory Visit: Payer: Medicare HMO | Admitting: Internal Medicine

## 2018-04-21 ENCOUNTER — Telehealth: Payer: Self-pay | Admitting: *Deleted

## 2018-04-21 NOTE — Telephone Encounter (Signed)
Noted  

## 2018-04-21 NOTE — Telephone Encounter (Signed)
Copied from [facility] [id number]. Topic: Quick Communication - Appointment Cancellation >> Apr 21, 2018  8:03 AM Lennox Solders wrote: Patient called to cancel appointment scheduled for paz. Patient {HAS rsc to department's PEC pool. Pt had to take his wife to [place] for colonoscopy today

## 2018-04-22 DIAGNOSIS — M5136 Other intervertebral disc degeneration, lumbar region: Secondary | ICD-10-CM | POA: Diagnosis not present

## 2018-04-22 DIAGNOSIS — M79642 Pain in left hand: Secondary | ICD-10-CM | POA: Diagnosis not present

## 2018-04-22 DIAGNOSIS — M180 Bilateral primary osteoarthritis of first carpometacarpal joints: Secondary | ICD-10-CM | POA: Diagnosis not present

## 2018-04-22 DIAGNOSIS — M79641 Pain in right hand: Secondary | ICD-10-CM | POA: Diagnosis not present

## 2018-04-29 ENCOUNTER — Ambulatory Visit (INDEPENDENT_AMBULATORY_CARE_PROVIDER_SITE_OTHER): Payer: Medicare HMO | Admitting: Internal Medicine

## 2018-04-29 ENCOUNTER — Encounter: Payer: Self-pay | Admitting: Internal Medicine

## 2018-04-29 VITALS — BP 122/78 | HR 56 | Temp 97.7°F | Resp 16 | Ht 71.0 in | Wt 234.1 lb

## 2018-04-29 DIAGNOSIS — M545 Low back pain, unspecified: Secondary | ICD-10-CM

## 2018-04-29 DIAGNOSIS — G8929 Other chronic pain: Secondary | ICD-10-CM

## 2018-04-29 DIAGNOSIS — J45909 Unspecified asthma, uncomplicated: Secondary | ICD-10-CM

## 2018-04-29 NOTE — Patient Instructions (Addendum)
Happy belated Rudene Anda!  Please schedule Medicare Wellness visit with Glenard Haring.    If you have cough or wheezing, start taking albuterol as needed  If you are taking albuterol more than 5 or 6 times a week, restart Symbicort

## 2018-04-29 NOTE — Progress Notes (Signed)
Subjective:    Patient ID: Kirk Sutton, male    DOB: Jun 01, 1950, 68 y.o.   MRN: [id number]  DOS:  04/29/2018 Type of visit - description: Follow-up, see last visit Asthma: Definitely improved.  He does not need Ventolin anymore.  He stopped Symbicort and currently does not plan to go back on it. Back pain: Had recently local injection.  Helping. Mouth lesion: Cleared  Review of Systems No fever chills No sinus pain Having some problems with TMJ but under the care of a dentist.  Doing better. Currently with no cough or wheezing.  Past Medical History:  Diagnosis Date  . Allergic rhinitis   . Asthma   . Chronic prostatitis    sees urology  . Diabetes mellitus    type 11 (a1c 6.0 03/2008)/no meds.  . Hernia, umbilical   . Hyperlipidemia   . Hypertension   . Psoriasis    @ hands, sees derm    Past Surgical History:  Procedure Laterality Date  . APPENDECTOMY    . FERTILITY SURGERY  1979  . HERNIA REPAIR    . NASAL SEPTUM SURGERY  2009  . SHOULDER SURGERY  2008   left  . TESTICLE SURGERY     undescended repair     Social History   Socioeconomic History  . Marital status: Married    Spouse name: Not on file  . Number of children: 2  . Years of education: Not on file  . Highest education level: Not on file  Occupational History  . Occupation: Administrator, arts  Social Needs  . Financial resource strain: Not on file  . Food insecurity:    Worry: Not on file    Inability: Not on file  . Transportation needs:    Medical: Not on file    Non-medical: Not on file  Tobacco Use  . Smoking status: Never Smoker  . Smokeless tobacco: Never Used  Substance and Sexual Activity  . Alcohol use: Yes    Comment: socially, rare  . Drug use: No  . Sexual activity: Not on file  Lifestyle  . Physical activity:    Days per week: Not on file    Minutes per session: Not on file  . Stress: Not on file  Relationships  . Social connections:    Talks  on phone: Not on file    Gets together: Not on file    Attends religious service: Not on file    Active member of club or organization: Not on file    Attends meetings of clubs or organizations: Not on file    Relationship status: Not on file  . Intimate partner violence:    Fear of current or ex partner: Not on file    Emotionally abused: Not on file    Physically abused: Not on file    Forced sexual activity: Not on file  Other Topics Concern  . Not on file  Social History Narrative   Household-- pt, wife       Allergies as of 04/29/2018      Reactions   Cetirizine Hcl    REACTION: prostatitis   Fexofenadine    REACTION: causes prostatitis      Medication List       Accurate as of April 29, 2018  8:03 AM. Always use your most recent med list.        acetaminophen 500 MG tablet Commonly known as:  TYLENOL Take 500 mg by  mouth daily as needed for moderate pain.   albuterol 108 (90 Base) MCG/ACT inhaler Commonly known as:  PROVENTIL HFA;VENTOLIN HFA Inhale 2 puffs into the lungs every 6 (six) hours as needed for wheezing or shortness of breath.   ammonium lactate 12 % lotion Commonly known as:  AMLACTIN Apply 1 application topically as needed for dry skin.   aspirin 81 MG tablet Take 81 mg by mouth once a week.   BIOFREEZE EX Apply topically.   budesonide-formoterol 80-4.5 MCG/ACT inhaler Commonly known as:  SYMBICORT Inhale 2 puffs into the lungs 2 (two) times daily.   buPROPion 150 MG 12 hr tablet Commonly known as:  WELLBUTRIN SR Take 150 mg by mouth daily.   clobetasol ointment 0.05 % Commonly known as:  TEMOVATE Apply 1 application topically 2 (two) times daily. For hands   doxycycline 100 MG tablet Commonly known as:  VIBRA-TABS Take 1 tablet (100 mg total) by mouth 2 (two) times daily.   FLONASE SENSIMIST NA Place into the nose daily as needed.   FLUoxetine 40 MG capsule Commonly known as:  PROZAC Take 1 tablet daily     hydrochlorothiazide 25 MG tablet Commonly known as:  HYDRODIURIL Take 1 tablet (25 mg total) by mouth daily.   loratadine 10 MG tablet Commonly known as:  CLARITIN Take 10 mg by mouth daily.   magnesium gluconate 500 MG tablet Commonly known as:  MAGONATE Take 500 mg by mouth 2 (two) times daily.   metoprolol succinate 50 MG 24 hr tablet Commonly known as:  TOPROL-XL Take 1 tablet (50 mg total) by mouth daily.   nystatin 100000 UNIT/ML suspension Commonly known as:  MYCOSTATIN Take 5 mLs (500,000 Units total) by mouth 4 (four) times daily.   PAZEO 0.7 % Soln Generic drug:  Olopatadine HCl INT 1 GTT IN OU QD   predniSONE 10 MG tablet Commonly known as:  DELTASONE 4 tablets x 2 days, 3 tabs x 2 days, 2 tabs x 2 days, 1 tab x 2 days   pyridOXINE 100 MG tablet Commonly known as:  VITAMIN B-6 Take 400 mg by mouth daily.   terazosin 1 MG capsule Commonly known as:  HYTRIN Take 3 capsules (3 mg total) by mouth daily.   Turmeric 500 MG Caps Take 1,000 mg by mouth daily.   vitamin C 1000 MG tablet Take 1,000 mg by mouth daily.   vitamin E 400 UNIT capsule Generic drug:  vitamin E Take 450 Units by mouth daily.           Objective:   Physical Exam BP 122/78 (BP Location: Left Arm, Patient Position: Sitting, Cuff Size: Normal)   Pulse (!) 56   Temp 97.7 F (36.5 C) (Oral)   Resp 16   Ht 5\' 11"  (1.803 m)   Wt 234 lb 2 oz (106.2 kg)   SpO2 96%   BMI 32.65 kg/m  General:   Well developed, NAD, BMI noted. HEENT:  Normocephalic . Face symmetric, atraumatic. Mouth: Hard soft palate look normal. Lungs:  CTA B Normal respiratory effort, no intercostal retractions, no accessory muscle use. Skin: Not pale. Not jaundice Neurologic:  alert & oriented X3.  Speech normal, gait appropriate for age and unassisted Psych--  Cognition and judgment appear intact.  Cooperative with normal attention span and concentration.  Behavior appropriate. No anxious or depressed  appearing.      Assessment     Assessment Prediabetes  HTN Hyperlipidemia Anxiety Dr Fay Records (psych) Rx prozac [phone]  Asthma: used symbicort before (~2015) , now on alb "prn" MSK:  --Chronic back pain, pain medication as needed, used to see Dr Nelva Bush, did PT @ [facility] [phone], better --DJD- hands Chronic prostatitis, sees urology Psoriasis, hands, sees dermatology Snoring (severe per pt)- dentist Rx a moth guard ~ 08-2015, helps  PLAN: Asthma: Since the last office visit, chest x-ray was okay, took a second round of antibiotics.  Currently doing great.  Has not been taking daily Symbicort or albuterol.  Plan: Albuterol as needed: Restart Symbicort if needed.  See AVS Palate  lesions: Resolved Chronic back pain: Recently had a local injection, that seemed to help. Follow-up in September as already scheduled

## 2018-04-29 NOTE — Progress Notes (Signed)
Pre visit review using our clinic review tool, if applicable. No additional management support is needed unless otherwise documented below in the visit note. 

## 2018-04-30 DIAGNOSIS — G8929 Other chronic pain: Secondary | ICD-10-CM | POA: Insufficient documentation

## 2018-04-30 DIAGNOSIS — M549 Dorsalgia, unspecified: Secondary | ICD-10-CM

## 2018-04-30 NOTE — Assessment & Plan Note (Signed)
Asthma: Since the last office visit, chest x-ray was okay, took a second round of antibiotics.  Currently doing great.  Has not been taking daily Symbicort or albuterol.  Plan: Albuterol as needed: Restart Symbicort if needed.  See AVS Palate  lesions: Resolved Chronic back pain: Recently had a local injection, that seemed to help. Follow-up in September as already scheduled

## 2018-05-09 DIAGNOSIS — M5136 Other intervertebral disc degeneration, lumbar region: Secondary | ICD-10-CM | POA: Diagnosis not present

## 2018-05-09 DIAGNOSIS — M48061 Spinal stenosis, lumbar region without neurogenic claudication: Secondary | ICD-10-CM | POA: Diagnosis not present

## 2018-05-15 DIAGNOSIS — M79641 Pain in right hand: Secondary | ICD-10-CM | POA: Diagnosis not present

## 2018-05-15 DIAGNOSIS — M79642 Pain in left hand: Secondary | ICD-10-CM | POA: Diagnosis not present

## 2018-05-15 DIAGNOSIS — M180 Bilateral primary osteoarthritis of first carpometacarpal joints: Secondary | ICD-10-CM | POA: Diagnosis not present

## 2018-06-03 DIAGNOSIS — H02052 Trichiasis without entropian right lower eyelid: Secondary | ICD-10-CM | POA: Diagnosis not present

## 2018-06-03 DIAGNOSIS — H10413 Chronic giant papillary conjunctivitis, bilateral: Secondary | ICD-10-CM | POA: Diagnosis not present

## 2018-07-07 DIAGNOSIS — H10413 Chronic giant papillary conjunctivitis, bilateral: Secondary | ICD-10-CM | POA: Diagnosis not present

## 2018-07-07 DIAGNOSIS — H01002 Unspecified blepharitis right lower eyelid: Secondary | ICD-10-CM | POA: Diagnosis not present

## 2018-07-07 DIAGNOSIS — H01001 Unspecified blepharitis right upper eyelid: Secondary | ICD-10-CM | POA: Diagnosis not present

## 2018-07-07 DIAGNOSIS — H01004 Unspecified blepharitis left upper eyelid: Secondary | ICD-10-CM | POA: Diagnosis not present

## 2018-07-21 DIAGNOSIS — H11431 Conjunctival hyperemia, right eye: Secondary | ICD-10-CM | POA: Diagnosis not present

## 2018-07-21 DIAGNOSIS — H5711 Ocular pain, right eye: Secondary | ICD-10-CM | POA: Diagnosis not present

## 2018-07-21 DIAGNOSIS — H04123 Dry eye syndrome of bilateral lacrimal glands: Secondary | ICD-10-CM | POA: Diagnosis not present

## 2018-07-29 DIAGNOSIS — M5136 Other intervertebral disc degeneration, lumbar region: Secondary | ICD-10-CM | POA: Diagnosis not present

## 2018-08-05 ENCOUNTER — Ambulatory Visit: Payer: Self-pay | Admitting: *Deleted

## 2018-08-05 NOTE — Telephone Encounter (Signed)
Needs virtual visit 

## 2018-08-05 NOTE — Telephone Encounter (Signed)
Patient is calling with concerns that he found out he had been exposed to positive COVID 13 days ago ( contact was very minimal)- he does not have symptoms and has been working all this time. He does take proper precautions and wears mask and gloves at job. He is very careful. He is going to watch for symptoms and avoid contact with people for the next 2 days- he will call if anything changes. Reviewed COVID protocols and symptoms with patient.  Reason for Disposition . [1] COVID-19 EXPOSURE (Close Contact) within last 14 days AND [2] NO cough, fever, or breathing difficulty  Answer Assessment - Initial Assessment Questions 1. CLOSE CONTACT: "Who is the person with the confirmed or suspected COVID-19 infection that you were exposed to?"     Client of company 2. PLACE of CONTACT: "Where were you when you were exposed to COVID-19?" (e.g., home, school, medical waiting room; which city?)     At plant where client works 3. TYPE of CONTACT: "How much contact was there?" (e.g., sitting next to, live in same house, work in same office, same building)     No more than 3-6 feet away wearing protective mask and gloves 4. DURATION of CONTACT: "How long were you in contact with the COVID-19 patient?" (e.g., a few seconds, passed by person, a few minutes, live with the patient)     No more than 20 minutes 5. DATE of CONTACT: "When did you have contact with a COVID-19 patient?" (e.g., how many days ago)     5/6 6. TRAVEL: "Have you traveled out of the country recently?" If so, "When and where?"     * Also ask about out-of-state travel, since the CDC has identified some high risk cities for community spread in the [place].     * Note: Travel becomes less relevant if there is widespread community transmission where the patient lives.     no 7. COMMUNITY SPREAD: "Are there lots of cases of COVID-19 (community spread) where you live?" (See public health department website, if unsure)   * MAJOR community spread: high  number of cases; numbers of cases are increasing; many people hospitalized.   * MINOR community spread: low number of cases; not increasing; few or no people hospitalized     Minor spread 8. SYMPTOMS: "Do you have any symptoms?" (e.g., fever, cough, breathing difficulty)     No symptoms 9. PREGNANCY OR POSTPARTUM: "Is there any chance you are pregnant?" "When was your last menstrual period?" "Did you deliver in the last 2 weeks?"     n/a 10. HIGH RISK: "Do you have any heart or lung problems? Do you have a weak immune system?" (e.g., CHF, COPD, asthma, HIV positive, chemotherapy, renal failure, diabetes mellitus, sickle cell anemia)       no  Protocols used: CORONAVIRUS (COVID-19) EXPOSURE-A-AH

## 2018-08-06 ENCOUNTER — Ambulatory Visit (INDEPENDENT_AMBULATORY_CARE_PROVIDER_SITE_OTHER): Payer: Medicare HMO | Admitting: Internal Medicine

## 2018-08-06 ENCOUNTER — Encounter: Payer: Self-pay | Admitting: Internal Medicine

## 2018-08-06 DIAGNOSIS — Z20828 Contact with and (suspected) exposure to other viral communicable diseases: Secondary | ICD-10-CM

## 2018-08-06 DIAGNOSIS — Z20822 Contact with and (suspected) exposure to covid-19: Secondary | ICD-10-CM

## 2018-08-06 NOTE — Progress Notes (Signed)
Subjective:    Patient ID: Kirk Sutton, male    DOB: 22-Apr-1950, 68 y.o.   MRN: [id number]  DOS:  08/06/2018 Type of visit - description: Virtual Visit via Video Note  I connected with@ on 08/06/18 at  9:00 AM EDT by a video enabled telemedicine application and verified that I am speaking with the correct person using two identifiers.   THIS ENCOUNTER IS A VIRTUAL VISIT DUE TO COVID-19 - PATIENT WAS NOT SEEN IN THE OFFICE. PATIENT HAS CONSENTED TO VIRTUAL VISIT / TELEMEDICINE VISIT   Location of patient: home  Location of provider: office  I discussed the limitations of evaluation and management by telemedicine and the availability of in person appointments. The patient expressed understanding and agreed to proceed.  History of Present Illness: Acute The patient visit clients on daily basis, he uses a mask consistently. On 07/23/2018 he visit the client, unfortunately this individual was not wearing a mask. Yesterday, he found out that this particular person was COVID-19 positive. [name] is feeling well. We reviewed together his medications: Good compliance without apparent side effects    Review of Systems No fever chills, the patient take his temperature sporadically No chest pain no difficulty breathing No nausea, vomiting, diarrhea Has a history of asthma, takes Symbicort and Proventil on an as-needed basis, currently very seldom.   Past Medical History:  Diagnosis Date  . Allergic rhinitis   . Asthma   . Chronic prostatitis    sees urology  . Diabetes mellitus    type 11 (a1c 6.0 03/2008)/no meds.  . Hernia, umbilical   . Hyperlipidemia   . Hypertension   . Psoriasis    @ hands, sees derm    Past Surgical History:  Procedure Laterality Date  . APPENDECTOMY    . FERTILITY SURGERY  1979  . HERNIA REPAIR    . NASAL SEPTUM SURGERY  2009  . SHOULDER SURGERY  2008   left  . TESTICLE SURGERY     undescended repair     Social History   Socioeconomic History   . Marital status: Married    Spouse name: Not on file  . Number of children: 2  . Years of education: Not on file  . Highest education level: Not on file  Occupational History  . Occupation: Administrator, arts  Social Needs  . Financial resource strain: Not on file  . Food insecurity:    Worry: Not on file    Inability: Not on file  . Transportation needs:    Medical: Not on file    Non-medical: Not on file  Tobacco Use  . Smoking status: Never Smoker  . Smokeless tobacco: Never Used  Substance and Sexual Activity  . Alcohol use: Yes    Comment: socially, rare  . Drug use: No  . Sexual activity: Not on file  Lifestyle  . Physical activity:    Days per week: Not on file    Minutes per session: Not on file  . Stress: Not on file  Relationships  . Social connections:    Talks on phone: Not on file    Gets together: Not on file    Attends religious service: Not on file    Active member of club or organization: Not on file    Attends meetings of clubs or organizations: Not on file    Relationship status: Not on file  . Intimate partner violence:    Fear of current or ex  partner: Not on file    Emotionally abused: Not on file    Physically abused: Not on file    Forced sexual activity: Not on file  Other Topics Concern  . Not on file  Social History Narrative   Household-- pt, wife       Allergies as of 08/06/2018      Reactions   Cetirizine Hcl    REACTION: prostatitis   Fexofenadine    REACTION: causes prostatitis      Medication List       Accurate as of Aug 06, 2018  9:06 AM. If you have any questions, ask your nurse or doctor.        acetaminophen 500 MG tablet Commonly known as:  TYLENOL Take 500 mg by mouth daily as needed for moderate pain.   albuterol 108 (90 Base) MCG/ACT inhaler Commonly known as:  VENTOLIN HFA Inhale 2 puffs into the lungs every 6 (six) hours as needed for wheezing or shortness of breath.   ammonium  lactate 12 % lotion Commonly known as:  AmLactin Apply 1 application topically as needed for dry skin.   aspirin 81 MG tablet Take 81 mg by mouth once a week.   BIOFREEZE EX Apply topically.   budesonide-formoterol 80-4.5 MCG/ACT inhaler Commonly known as:  SYMBICORT Inhale 2 puffs into the lungs 2 (two) times daily.   buPROPion 150 MG 12 hr tablet Commonly known as:  WELLBUTRIN SR Take 150 mg by mouth daily.   clobetasol ointment 0.05 % Commonly known as:  TEMOVATE Apply 1 application topically 2 (two) times daily. For hands   FLONASE SENSIMIST NA Place into the nose daily as needed.   FLUoxetine 40 MG capsule Commonly known as:  PROZAC Take 1 tablet daily   hydrochlorothiazide 25 MG tablet Commonly known as:  HYDRODIURIL Take 1 tablet (25 mg total) by mouth daily.   loratadine 10 MG tablet Commonly known as:  CLARITIN Take 10 mg by mouth daily.   magnesium gluconate 500 MG tablet Commonly known as:  MAGONATE Take 500 mg by mouth 2 (two) times daily.   metoprolol succinate 50 MG 24 hr tablet Commonly known as:  TOPROL-XL Take 1 tablet (50 mg total) by mouth daily.   Pazeo 0.7 % Soln Generic drug:  Olopatadine HCl INT 1 GTT IN OU QD   pyridOXINE 100 MG tablet Commonly known as:  VITAMIN B-6 Take 400 mg by mouth daily.   terazosin 1 MG capsule Commonly known as:  HYTRIN Take 3 capsules (3 mg total) by mouth daily.   Turmeric 500 MG Caps Take 1,000 mg by mouth daily.   vitamin C 1000 MG tablet Take 1,000 mg by mouth daily.   vitamin E 400 UNIT capsule Generic drug:  vitamin E Take 450 Units by mouth daily.           Objective:   Physical Exam There were no vitals taken for this visit. This is a virtual video assessment, alert oriented x3, no apparent distress    Assessment     Assessment Prediabetes  HTN Hyperlipidemia Anxiety Dr Fay Records (psych) Rx prozac ~12-2014  Asthma: used symbicort before (~2015) , now on alb "prn" MSK:   --Chronic back pain, pain medication as needed, used to see Dr Nelva Bush, did PT @ [facility] [phone], better --DJD- hands Chronic prostatitis, sees urology Psoriasis, hands, sees dermatology Snoring (severe per pt)- dentist Rx a moth guard ~ 08-2015, helps  PLAN: Virtual visit COVID-19 exposure: 14 days ago  he was in contact with, with positive patient, since then, [place] remain asymptomatic.  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 At this point I have no reason to believe he is infected or sick. Plan: Continue following all COVID precautions, check temperature daily, watch for increased respiratory symptoms or GI symptoms.  Call if he has problems Asthma: Well-controlled, hardly ever uses albuterol or Symbicort.

## 2018-08-07 NOTE — Assessment & Plan Note (Signed)
Virtual visit COVID-19 exposure: 14 days ago he was in contact with, with positive patient, since then, [place] remain asymptomatic.  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 At this point I have no reason to believe he is infected or sick. Plan: Continue following all COVID precautions, check temperature daily, watch for increased respiratory symptoms or GI symptoms.  Call if he has problems Asthma: Well-controlled, hardly ever uses albuterol or Symbicort.

## 2018-08-16 ENCOUNTER — Other Ambulatory Visit: Payer: Self-pay | Admitting: Internal Medicine

## 2018-08-20 DIAGNOSIS — M79641 Pain in right hand: Secondary | ICD-10-CM | POA: Diagnosis not present

## 2018-08-20 DIAGNOSIS — M79642 Pain in left hand: Secondary | ICD-10-CM | POA: Diagnosis not present

## 2018-08-20 DIAGNOSIS — M1811 Unilateral primary osteoarthritis of first carpometacarpal joint, right hand: Secondary | ICD-10-CM | POA: Diagnosis not present

## 2018-08-20 DIAGNOSIS — M1812 Unilateral primary osteoarthritis of first carpometacarpal joint, left hand: Secondary | ICD-10-CM | POA: Diagnosis not present

## 2018-08-20 DIAGNOSIS — M180 Bilateral primary osteoarthritis of first carpometacarpal joints: Secondary | ICD-10-CM | POA: Diagnosis not present

## 2018-08-26 DIAGNOSIS — F331 Major depressive disorder, recurrent, moderate: Secondary | ICD-10-CM | POA: Diagnosis not present

## 2018-09-29 DIAGNOSIS — H10413 Chronic giant papillary conjunctivitis, bilateral: Secondary | ICD-10-CM | POA: Diagnosis not present

## 2018-09-29 DIAGNOSIS — H01001 Unspecified blepharitis right upper eyelid: Secondary | ICD-10-CM | POA: Diagnosis not present

## 2018-09-29 DIAGNOSIS — H01004 Unspecified blepharitis left upper eyelid: Secondary | ICD-10-CM | POA: Diagnosis not present

## 2018-09-29 DIAGNOSIS — H01002 Unspecified blepharitis right lower eyelid: Secondary | ICD-10-CM | POA: Diagnosis not present

## 2018-10-10 ENCOUNTER — Telehealth: Payer: Self-pay

## 2018-10-10 NOTE — Telephone Encounter (Signed)
Copied from [place] [id number]. Topic: General - Other >> Oct 10, 2018 11:39 AM Rainey Pines A wrote: Patient stated that he wanted a callback from nurse in regards to how to administer the symbicort he was prescribed.

## 2018-10-13 NOTE — Telephone Encounter (Signed)
Tried calling Pt- no answer- unable to leave message.

## 2018-10-21 DIAGNOSIS — M1812 Unilateral primary osteoarthritis of first carpometacarpal joint, left hand: Secondary | ICD-10-CM | POA: Diagnosis not present

## 2018-10-23 ENCOUNTER — Ambulatory Visit (INDEPENDENT_AMBULATORY_CARE_PROVIDER_SITE_OTHER): Payer: Medicare HMO | Admitting: Internal Medicine

## 2018-10-23 ENCOUNTER — Other Ambulatory Visit: Payer: Self-pay

## 2018-10-23 DIAGNOSIS — J40 Bronchitis, not specified as acute or chronic: Secondary | ICD-10-CM | POA: Diagnosis not present

## 2018-10-23 DIAGNOSIS — J4531 Mild persistent asthma with (acute) exacerbation: Secondary | ICD-10-CM

## 2018-10-23 MED ORDER — AZITHROMYCIN 250 MG PO TABS: ORAL_TABLET | ORAL | 0 refills | Status: DC

## 2018-10-23 NOTE — Assessment & Plan Note (Signed)
Bronchitis with asthma exacerbation: Symptoms started 2 weeks ago, slightly worse for the last 2 days, similar to previous bronchitis. We talk about possibly COVID-19 testing but patient declined, we agreed that we will do it if he is not better. Plan:  Continue Symbicort (he restarted it today) twice a day Pro-air as needed Z-Pak Mucinex DM Call if not gradually better

## 2018-10-23 NOTE — Progress Notes (Signed)
Subjective:    Patient ID: Kirk Sutton, male    DOB: 06/24/1950, 68 y.o.   MRN: [id number]  DOS:  10/23/2018 Type of visit - description: Attempted  to make this a video visit, due to technical difficulties from the patient side it was not possible  thus we proceeded with a Virtual Visit via Telephone    I connected with@   by telephone and verified that I am speaking with the correct person using two identifiers.  THIS ENCOUNTER IS A VIRTUAL VISIT DUE TO COVID-19 - PATIENT WAS NOT SEEN IN THE OFFICE. PATIENT HAS CONSENTED TO VIRTUAL VISIT / TELEMEDICINE VISIT   Location of patient: home  Location of provider: office  I discussed the limitations, risks, security and privacy concerns of performing an evaluation and management service by telephone and the availability of in person appointments. I also discussed with the patient that there may be a patient responsible charge related to this service. The patient expressed understanding and agreed to proceed.   History of Present Illness: Acute Symptoms started 2 weeks ago with mild cough, some wheezing and a "croupy cough". Symptoms were stable until 2 days ago when they started to increase. Today he decided to restart Symbicort.     Review of Systems He checks his temperature: No fever No sinus pain or congestion No malaise No chest pain or difficulty breathing.  No lower extremity edema No nausea, vomiting, diarrhea  Past Medical History:  Diagnosis Date  . Allergic rhinitis   . Asthma   . Chronic prostatitis    sees urology  . Diabetes mellitus    type 11 (a1c 6.0 03/2008)/no meds.  . Hernia, umbilical   . Hyperlipidemia   . Hypertension   . Psoriasis    @ hands, sees derm    Past Surgical History:  Procedure Laterality Date  . APPENDECTOMY    . FERTILITY SURGERY  1979  . HERNIA REPAIR    . NASAL SEPTUM SURGERY  2009  . SHOULDER SURGERY  2008   left  . TESTICLE SURGERY     undescended repair     Social  History   Socioeconomic History  . Marital status: Married    Spouse name: Not on file  . Number of children: 2  . Years of education: Not on file  . Highest education level: Not on file  Occupational History  . Occupation: Administrator, arts  Social Needs  . Financial resource strain: Not on file  . Food insecurity    Worry: Not on file    Inability: Not on file  . Transportation needs    Medical: Not on file    Non-medical: Not on file  Tobacco Use  . Smoking status: Never Smoker  . Smokeless tobacco: Never Used  Substance and Sexual Activity  . Alcohol use: Yes    Comment: socially, rare  . Drug use: No  . Sexual activity: Not on file  Lifestyle  . Physical activity    Days per week: Not on file    Minutes per session: Not on file  . Stress: Not on file  Relationships  . Social Herbalist on phone: Not on file    Gets together: Not on file    Attends religious service: Not on file    Active member of club or organization: Not on file    Attends meetings of clubs or organizations: Not on file    Relationship  status: Not on file  . Intimate partner violence    Fear of current or ex partner: Not on file    Emotionally abused: Not on file    Physically abused: Not on file    Forced sexual activity: Not on file  Other Topics Concern  . Not on file  Social History Narrative   Household-- pt, wife       Allergies as of 10/23/2018      Reactions   Cetirizine Hcl    REACTION: prostatitis   Fexofenadine    REACTION: causes prostatitis      Medication List       Accurate as of October 23, 2018 11:42 AM. If you have any questions, ask your nurse or doctor.        acetaminophen 500 MG tablet Commonly known as: TYLENOL Take 500 mg by mouth daily as needed for moderate pain.   albuterol 108 (90 Base) MCG/ACT inhaler Commonly known as: VENTOLIN HFA Inhale 2 puffs into the lungs every 6 (six) hours as needed for wheezing or  shortness of breath.   ammonium lactate 12 % lotion Commonly known as: AmLactin Apply 1 application topically as needed for dry skin.   aspirin 81 MG tablet Take 81 mg by mouth once a week.   BIOFREEZE EX Apply topically.   budesonide-formoterol 80-4.5 MCG/ACT inhaler Commonly known as: SYMBICORT Inhale 2 puffs into the lungs 2 (two) times daily.   buPROPion 150 MG 12 hr tablet Commonly known as: WELLBUTRIN SR Take 150 mg by mouth daily.   clobetasol ointment 0.05 % Commonly known as: TEMOVATE Apply 1 application topically 2 (two) times daily. For hands   FLONASE SENSIMIST NA Place into the nose daily as needed.   FLUoxetine 40 MG capsule Commonly known as: PROZAC Take 1 tablet daily   hydrochlorothiazide 25 MG tablet Commonly known as: HYDRODIURIL TAKE 1 TABLET(25 MG) BY MOUTH DAILY   loratadine 10 MG tablet Commonly known as: CLARITIN Take 10 mg by mouth daily.   magnesium gluconate 500 MG tablet Commonly known as: MAGONATE Take 500 mg by mouth 2 (two) times daily.   metoprolol succinate 50 MG 24 hr tablet Commonly known as: TOPROL-XL Take 1 tablet (50 mg total) by mouth daily.   Pazeo 0.7 % Soln Generic drug: Olopatadine HCl INT 1 GTT IN OU QD   pyridOXINE 100 MG tablet Commonly known as: VITAMIN B-6 Take 400 mg by mouth daily.   terazosin 1 MG capsule Commonly known as: HYTRIN Take 3 capsules (3 mg total) by mouth daily.   Turmeric 500 MG Caps Take 1,000 mg by mouth daily.   vitamin C 1000 MG tablet Take 1,000 mg by mouth daily.   vitamin E 400 UNIT capsule Generic drug: vitamin E Take 450 Units by mouth daily.           Objective:   Physical Exam There were no vitals taken for this visit. This is phone virtual visit, alert oriented x3, speaking in  complete sentences    Assessment     Assessment Prediabetes  HTN Hyperlipidemia Anxiety Dr Fay Records (psych) Rx prozac ~12-2014  Asthma: used symbicort before (~2015) , now on alb "prn"  MSK:  --Chronic back pain, pain medication as needed, used to see Dr Nelva Bush, did PT @ [facility] [phone], better --DJD- hands Chronic prostatitis, sees urology Psoriasis, hands, sees dermatology Snoring (severe per pt)- dentist Rx a moth guard ~ 08-2015, helps  PLAN: Bronchitis with asthma exacerbation: Symptoms started 2  weeks ago, slightly worse for the last 2 days, similar to previous bronchitis. We talk about possibly COVID-19 testing but patient declined, we agreed that we will do it if he is not better. Plan:  Continue Symbicort (he restarted it today) twice a day Pro-air as needed Z-Pak Mucinex DM Call if not gradually better  I discussed the assessment and treatment plan with the patient. The patient was provided an opportunity to ask questions and all were answered. The patient agreed with the plan and demonstrated an understanding of the instructions.   The patient was advised to call back or seek an in-person evaluation if the symptoms worsen or if the condition fails to improve as anticipated.  I provided 15  minutes of non-face-to-face time during this encounter.  Kathlene November, MD

## 2018-11-09 ENCOUNTER — Other Ambulatory Visit: Payer: Self-pay | Admitting: Internal Medicine

## 2018-11-17 ENCOUNTER — Other Ambulatory Visit: Payer: Self-pay

## 2018-11-17 DIAGNOSIS — Z20822 Contact with and (suspected) exposure to covid-19: Secondary | ICD-10-CM

## 2018-11-17 DIAGNOSIS — R6889 Other general symptoms and signs: Secondary | ICD-10-CM | POA: Diagnosis not present

## 2018-11-19 LAB — NOVEL CORONAVIRUS, NAA: SARS-CoV-2, NAA: NOT DETECTED

## 2018-11-25 ENCOUNTER — Encounter: Payer: Medicare HMO | Admitting: Internal Medicine

## 2018-12-09 DIAGNOSIS — M5136 Other intervertebral disc degeneration, lumbar region: Secondary | ICD-10-CM | POA: Diagnosis not present

## 2018-12-18 DIAGNOSIS — M1812 Unilateral primary osteoarthritis of first carpometacarpal joint, left hand: Secondary | ICD-10-CM | POA: Diagnosis not present

## 2018-12-18 DIAGNOSIS — M1811 Unilateral primary osteoarthritis of first carpometacarpal joint, right hand: Secondary | ICD-10-CM | POA: Diagnosis not present

## 2018-12-18 DIAGNOSIS — M79642 Pain in left hand: Secondary | ICD-10-CM | POA: Diagnosis not present

## 2018-12-18 DIAGNOSIS — M79641 Pain in right hand: Secondary | ICD-10-CM | POA: Diagnosis not present

## 2018-12-19 ENCOUNTER — Other Ambulatory Visit: Payer: Self-pay

## 2018-12-22 ENCOUNTER — Encounter: Payer: Self-pay | Admitting: Internal Medicine

## 2018-12-22 ENCOUNTER — Ambulatory Visit (INDEPENDENT_AMBULATORY_CARE_PROVIDER_SITE_OTHER): Payer: Medicare HMO | Admitting: Internal Medicine

## 2018-12-22 ENCOUNTER — Other Ambulatory Visit: Payer: Self-pay

## 2018-12-22 VITALS — BP 158/86 | HR 59 | Temp 96.6°F | Resp 16 | Ht 71.0 in | Wt 232.0 lb

## 2018-12-22 DIAGNOSIS — I10 Essential (primary) hypertension: Secondary | ICD-10-CM

## 2018-12-22 DIAGNOSIS — Z0001 Encounter for general adult medical examination with abnormal findings: Secondary | ICD-10-CM | POA: Diagnosis not present

## 2018-12-22 DIAGNOSIS — Z23 Encounter for immunization: Secondary | ICD-10-CM

## 2018-12-22 DIAGNOSIS — Z0000 Encounter for general adult medical examination without abnormal findings: Secondary | ICD-10-CM

## 2018-12-22 LAB — COMPREHENSIVE METABOLIC PANEL
ALT: 14 U/L (ref 0–53)
AST: 17 U/L (ref 0–37)
Albumin: 4.2 g/dL (ref 3.5–5.2)
Alkaline Phosphatase: 66 U/L (ref 39–117)
BUN: 14 mg/dL (ref 6–23)
CO2: 32 mEq/L (ref 19–32)
Calcium: 9.4 mg/dL (ref 8.4–10.5)
Chloride: 100 mEq/L (ref 96–112)
Creatinine, Ser: 1.2 mg/dL (ref 0.40–1.50)
GFR: 59.73 mL/min — ABNORMAL LOW (ref >60.00)
Glucose, Bld: 94 mg/dL (ref 70–99)
Potassium: 3.8 mEq/L (ref 3.5–5.1)
Sodium: 139 mEq/L (ref 135–145)
Total Bilirubin: 1 mg/dL (ref 0.2–1.2)
Total Protein: 6.4 g/dL (ref 6.0–8.3)

## 2018-12-22 LAB — LIPID PANEL
Cholesterol: 170 mg/dL (ref 0–200)
HDL: 46.3 mg/dL (ref >39.00)
LDL Cholesterol: 99 mg/dL (ref 0–99)
NonHDL: 123.86
Total CHOL/HDL Ratio: 4
Triglycerides: 122 mg/dL (ref 0.0–149.0)
VLDL: 24.4 mg/dL (ref 0.0–40.0)

## 2018-12-22 LAB — CBC WITH DIFFERENTIAL/PLATELET
Basophils Absolute: 0 10*3/uL (ref 0.0–0.1)
Basophils Relative: 0.4 % (ref 0.0–3.0)
Eosinophils Absolute: 0.1 10*3/uL (ref 0.0–0.7)
Eosinophils Relative: 1.4 % (ref 0.0–5.0)
HCT: 40.2 % (ref 39.0–52.0)
Hemoglobin: 13.5 g/dL (ref 13.0–17.0)
Lymphocytes Relative: 26.1 % (ref 12.0–46.0)
Lymphs Abs: 2.2 10*3/uL (ref 0.7–4.0)
MCHC: 33.7 g/dL (ref 30.0–36.0)
MCV: 95.7 fl (ref 78.0–100.0)
Monocytes Absolute: 0.6 10*3/uL (ref 0.1–1.0)
Monocytes Relative: 6.8 % (ref 3.0–12.0)
Neutro Abs: 5.5 10*3/uL (ref 1.4–7.7)
Neutrophils Relative %: 65.3 % (ref 43.0–77.0)
Platelets: 151 10*3/uL (ref 150.0–400.0)
RBC: 4.2 Mil/uL — ABNORMAL LOW (ref 4.22–5.81)
RDW: 13.3 % (ref 11.5–15.5)
WBC: 8.4 10*3/uL (ref 4.0–10.5)

## 2018-12-22 LAB — TSH: TSH: 1.25 u[IU]/mL (ref 0.35–4.50)

## 2018-12-22 NOTE — Patient Instructions (Addendum)
Please schedule Medicare Wellness with Glenard Haring.   GO TO THE LAB : Get the blood work     GO TO THE FRONT DESK Schedule your next appointment   for physical exam in 1 year v    Check the  blood pressure 2 or 3 times a month vBP GOAL is between 110/65 and  135/85. If it is consistently higher or lower, let me know  v

## 2018-12-22 NOTE — Progress Notes (Signed)
Subjective:    Patient ID: Kirk Sutton, male    DOB: August 27, 1950, 68 y.o.   MRN: [id number]  DOS:  12/22/2018 Type of visit - description: cpx Since the last office visit he is doing well  Wt Readings from Last 3 Encounters:  12/22/18 232 lb (105.2 kg)  04/29/18 234 lb 2 oz (106.2 kg)  03/24/18 232 lb 8 oz (105.5 kg)   BP Readings from Last 3 Encounters:  12/22/18 (!) 158/86  04/29/18 122/78  03/24/18 122/78     Review of Systems  A 14 point review of systems is negative    Past Medical History:  Diagnosis Date  . Allergic rhinitis   . Asthma   . Chronic prostatitis    sees urology  . Diabetes mellitus    type 11 (a1c 6.0 03/2008)/no meds.  . Hernia, umbilical   . Hyperlipidemia   . Hypertension   . Psoriasis    @ hands, sees derm    Past Surgical History:  Procedure Laterality Date  . APPENDECTOMY    . FERTILITY SURGERY  1979  . HERNIA REPAIR    . NASAL SEPTUM SURGERY  2009  . SHOULDER SURGERY  2008   left  . TESTICLE SURGERY     undescended repair     Social History   Socioeconomic History  . Marital status: Married    Spouse name: Not on file  . Number of children: 2  . Years of education: Not on file  . Highest education level: Not on file  Occupational History  . Occupation: Administrator, arts  Social Needs  . Financial resource strain: Not on file  . Food insecurity    Worry: Not on file    Inability: Not on file  . Transportation needs    Medical: Not on file    Non-medical: Not on file  Tobacco Use  . Smoking status: Never Smoker  . Smokeless tobacco: Never Used  Substance and Sexual Activity  . Alcohol use: Yes    Comment: socially, rare  . Drug use: No  . Sexual activity: Not on file  Lifestyle  . Physical activity    Days per week: Not on file    Minutes per session: Not on file  . Stress: Not on file  Relationships  . Social Herbalist on phone: Not on file    Gets together: Not on file     Attends religious service: Not on file    Active member of club or organization: Not on file    Attends meetings of clubs or organizations: Not on file    Relationship status: Not on file  . Intimate partner violence    Fear of current or ex partner: Not on file    Emotionally abused: Not on file    Physically abused: Not on file    Forced sexual activity: Not on file  Other Topics Concern  . Not on file  Social History Narrative   Household-- pt, wife      Family History  Problem Relation Age of Onset  . Diabetes Sister   . Hypertension Mother   . Colon polyps Mother   . Hypertension Sister   . Heart attack Father        x 3 onset age 39, died at 1  . Colon polyps Father   . Cancer Father        bladder  . Colon cancer Neg Hx   .  Prostate cancer Neg Hx   . Stomach cancer Neg Hx      Allergies as of 12/22/2018      Reactions   Cetirizine Hcl    REACTION: prostatitis   Fexofenadine    REACTION: causes prostatitis      Medication List       Accurate as of December 22, 2018 11:59 PM. If you have any questions, ask your nurse or doctor.        STOP taking these medications   azithromycin 250 MG tablet Commonly known as: Zithromax Z-Pak Stopped by: Kathlene November, MD     TAKE these medications   acetaminophen 500 MG tablet Commonly known as: TYLENOL Take 500 mg by mouth daily as needed for moderate pain.   albuterol 108 (90 Base) MCG/ACT inhaler Commonly known as: VENTOLIN HFA Inhale 2 puffs into the lungs every 6 (six) hours as needed for wheezing or shortness of breath.   ammonium lactate 12 % lotion Commonly known as: AmLactin Apply 1 application topically as needed for dry skin.   aspirin 81 MG tablet Take 81 mg by mouth 3 (three) times a week.   BIOFREEZE EX Apply topically.   budesonide-formoterol 80-4.5 MCG/ACT inhaler Commonly known as: SYMBICORT Inhale 2 puffs into the lungs 2 (two) times daily.   buPROPion 150 MG 12 hr tablet Commonly  known as: WELLBUTRIN SR Take 150 mg by mouth daily.   clobetasol ointment 0.05 % Commonly known as: TEMOVATE Apply 1 application topically 2 (two) times daily. For hands   FLONASE SENSIMIST NA Place into the nose daily as needed.   FLUoxetine 40 MG capsule Commonly known as: PROZAC Take 1 tablet daily   hydrochlorothiazide 25 MG tablet Commonly known as: HYDRODIURIL Take 1 tablet (25 mg total) by mouth daily.   loratadine 10 MG tablet Commonly known as: CLARITIN Take 10 mg by mouth daily.   magnesium gluconate 500 MG tablet Commonly known as: MAGONATE Take 500 mg by mouth 2 (two) times daily.   metoprolol succinate 50 MG 24 hr tablet Commonly known as: TOPROL-XL Take 1 tablet (50 mg total) by mouth daily.   Pazeo 0.7 % Soln Generic drug: Olopatadine HCl INT 1 GTT IN OU QD   pyridOXINE 100 MG tablet Commonly known as: VITAMIN B-6 Take 400 mg by mouth daily.   terazosin 1 MG capsule Commonly known as: HYTRIN Take 3 capsules (3 mg total) by mouth daily.   Turmeric 500 MG Caps Take 1,000 mg by mouth daily.   vitamin C 1000 MG tablet Take 1,000 mg by mouth daily.   vitamin E 400 UNIT capsule Generic drug: vitamin E Take 450 Units by mouth daily.           Objective:   Physical Exam BP (!) 158/86 (BP Location: Left Arm, Patient Position: Sitting, Cuff Size: Normal)   Pulse (!) 59   Temp (!) 96.6 F (35.9 C) (Temporal)   Resp 16   Ht 5\' 11"  (1.803 m)   Wt 232 lb (105.2 kg)   SpO2 97%   BMI 32.36 kg/m  General: Well developed, NAD, BMI noted Neck: No  thyromegaly  HEENT:  Normocephalic . Face symmetric, atraumatic Lungs:  CTA B Normal respiratory effort, no intercostal retractions, no accessory muscle use. Heart: RRR,  no murmur.  No pretibial edema bilaterally  Abdomen:  Not distended, soft, non-tender. No rebound or rigidity. + Umbilical hernia, about 1 inch in diameter, reducible, nontender. Skin: Exposed areas without rash. Not pale.  Not  jaundice Neurologic:  alert & oriented X3.  Speech normal, gait appropriate for age and unassisted Strength symmetric and appropriate for age.  Psych: Cognition and judgment appear intact.  Cooperative with normal attention span and concentration.  Behavior appropriate. No anxious or depressed appearing.     Assessment     Assessment Prediabetes  HTN Hyperlipidemia Anxiety Dr Fay Records (psych) Rx prozac ~12-2014  Asthma: used symbicort before (~2015) , now on alb "prn" MSK:  --Chronic back pain, pain medication as needed, used to see Dr Nelva Bush, did PT @ [facility] [phone], better --DJD- hands Chronic prostatitis, sees urology Psoriasis, hands, sees dermatology Snoring (severe per pt)- dentist Rx a moth guard ~ 08-2015, helps  PLAN: Here for CPX Prediabetes: Trying to do better with diet and exercise, praised! check A1c HTN: BP slightly elevated today, typically okay, good compliance with HCTZ, metoprolol, terazosin.  Checking labs, recommend to check BPs twice a month, call if not at goal, see AVS EKG: NSR, no acute changes Asthma: Hardly ever has symptoms, uses Symbicort as needed, hardly ever uses albuterol Umbilical hernia: He is well aware of red flag symptoms. MSK: Follow-up elsewhere. Psoriasis: To see dermatology soon, he has a couple of spots in the skin that concerns him. RTC 1 year as long as BPs are okay

## 2018-12-22 NOTE — Assessment & Plan Note (Signed)
-  Td 2013 -  PNM 23--2015 - prevnar-- 2016 - Zostavax 2013 - shingrex rx printed last year, has not gotten it yet but plans to -Flu shot today - [facility]: Colonoscopy Dr Vladimir Faster 2005 (-),   Cscope 02/2017, next per GI -Sees urology yearly    -Diet-exercise discussed. -Labs: CMP, FLP, CBC, A1c

## 2018-12-22 NOTE — Progress Notes (Signed)
Pre visit review using our clinic review tool, if applicable. No additional management support is needed unless otherwise documented below in the visit note. 

## 2018-12-24 ENCOUNTER — Telehealth: Payer: Self-pay

## 2018-12-24 NOTE — Telephone Encounter (Signed)
Add on faxed to [facility] lab for A1c.

## 2018-12-24 NOTE — Telephone Encounter (Signed)
Pt calling regarding labs. Please advise.

## 2018-12-24 NOTE — Telephone Encounter (Signed)
Copied from [place] [id number]. Topic: Quick Communication - Lab Results (Clinic Use ONLY) >> Dec 24, 2018  3:58 PM [place] wrote: CRM LAB RESULTS: pt was wanting to touch base with Dr Mamie Nick nurse about lab results from Monday call [phone] for FU

## 2018-12-24 NOTE — Telephone Encounter (Signed)
See results, they are good.  Apparently I did not order an A1c, please add on or redraw . thx

## 2018-12-24 NOTE — Assessment & Plan Note (Signed)
Here for CPX Prediabetes: Trying to do better with diet and exercise, praised! check A1c HTN: BP slightly elevated today, typically okay, good compliance with HCTZ, metoprolol, terazosin.  Checking labs, recommend to check BPs twice a month, call if not at goal, see AVS EKG: NSR, no acute changes Asthma: Hardly ever has symptoms, uses Symbicort as needed, hardly ever uses albuterol Umbilical hernia: He is well aware of red flag symptoms. MSK: Follow-up elsewhere. Psoriasis: To see dermatology soon, he has a couple of spots in the skin that concerns him. RTC 1 year as long as BPs are okay

## 2018-12-25 ENCOUNTER — Other Ambulatory Visit (INDEPENDENT_AMBULATORY_CARE_PROVIDER_SITE_OTHER): Payer: Medicare HMO

## 2018-12-25 DIAGNOSIS — R739 Hyperglycemia, unspecified: Secondary | ICD-10-CM

## 2018-12-25 LAB — HEMOGLOBIN A1C: Hgb A1c MFr Bld: 6.1 % (ref 4.6–6.5)

## 2019-01-08 DIAGNOSIS — M180 Bilateral primary osteoarthritis of first carpometacarpal joints: Secondary | ICD-10-CM | POA: Diagnosis not present

## 2019-01-29 ENCOUNTER — Other Ambulatory Visit: Payer: Self-pay | Admitting: Internal Medicine

## 2019-01-29 DIAGNOSIS — I10 Essential (primary) hypertension: Secondary | ICD-10-CM

## 2019-02-04 DIAGNOSIS — H0100A Unspecified blepharitis right eye, upper and lower eyelids: Secondary | ICD-10-CM | POA: Diagnosis not present

## 2019-02-04 DIAGNOSIS — H10413 Chronic giant papillary conjunctivitis, bilateral: Secondary | ICD-10-CM | POA: Diagnosis not present

## 2019-02-04 DIAGNOSIS — H35033 Hypertensive retinopathy, bilateral: Secondary | ICD-10-CM | POA: Diagnosis not present

## 2019-02-11 DIAGNOSIS — N400 Enlarged prostate without lower urinary tract symptoms: Secondary | ICD-10-CM | POA: Diagnosis not present

## 2019-02-18 DIAGNOSIS — N400 Enlarged prostate without lower urinary tract symptoms: Secondary | ICD-10-CM | POA: Diagnosis not present

## 2019-02-24 DIAGNOSIS — F331 Major depressive disorder, recurrent, moderate: Secondary | ICD-10-CM | POA: Diagnosis not present

## 2019-03-03 DIAGNOSIS — D1801 Hemangioma of skin and subcutaneous tissue: Secondary | ICD-10-CM | POA: Diagnosis not present

## 2019-03-03 DIAGNOSIS — L570 Actinic keratosis: Secondary | ICD-10-CM | POA: Diagnosis not present

## 2019-03-03 DIAGNOSIS — L2089 Other atopic dermatitis: Secondary | ICD-10-CM | POA: Diagnosis not present

## 2019-03-03 DIAGNOSIS — L821 Other seborrheic keratosis: Secondary | ICD-10-CM | POA: Diagnosis not present

## 2019-03-03 DIAGNOSIS — D224 Melanocytic nevi of scalp and neck: Secondary | ICD-10-CM | POA: Diagnosis not present

## 2019-03-09 ENCOUNTER — Other Ambulatory Visit: Payer: Self-pay | Admitting: Internal Medicine

## 2019-03-23 DIAGNOSIS — M1811 Unilateral primary osteoarthritis of first carpometacarpal joint, right hand: Secondary | ICD-10-CM | POA: Diagnosis not present

## 2019-03-23 DIAGNOSIS — M1812 Unilateral primary osteoarthritis of first carpometacarpal joint, left hand: Secondary | ICD-10-CM | POA: Diagnosis not present

## 2019-03-23 DIAGNOSIS — M79642 Pain in left hand: Secondary | ICD-10-CM | POA: Diagnosis not present

## 2019-03-23 DIAGNOSIS — M79641 Pain in right hand: Secondary | ICD-10-CM | POA: Diagnosis not present

## 2019-03-23 IMAGING — DX DG CHEST 2V
2 series · 2 of 2 positions shown · non-contrast
Comparison: 04/12/2015

CLINICAL DATA: Cough

EXAM:
CHEST - 2 VIEW

[chest pa]
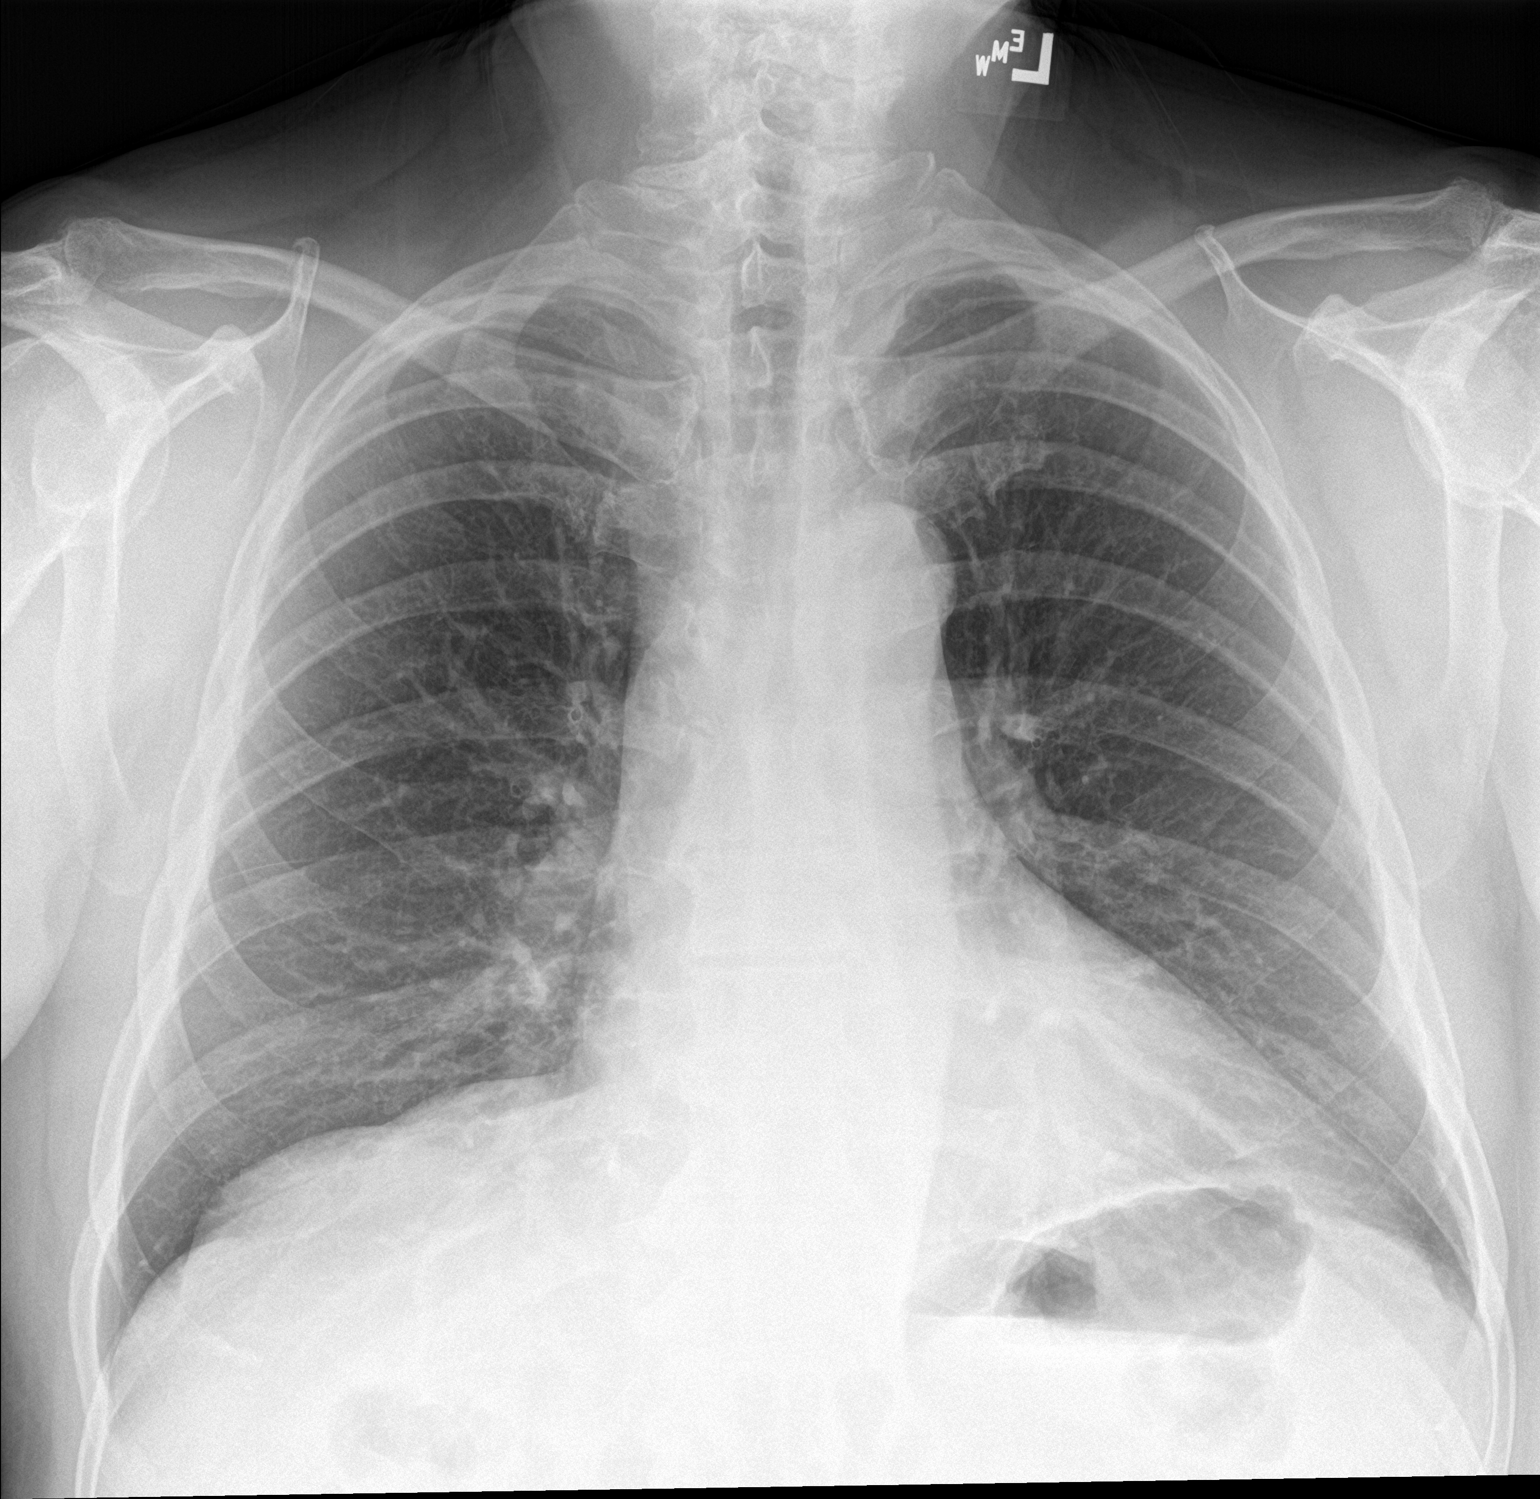

[chest lat]
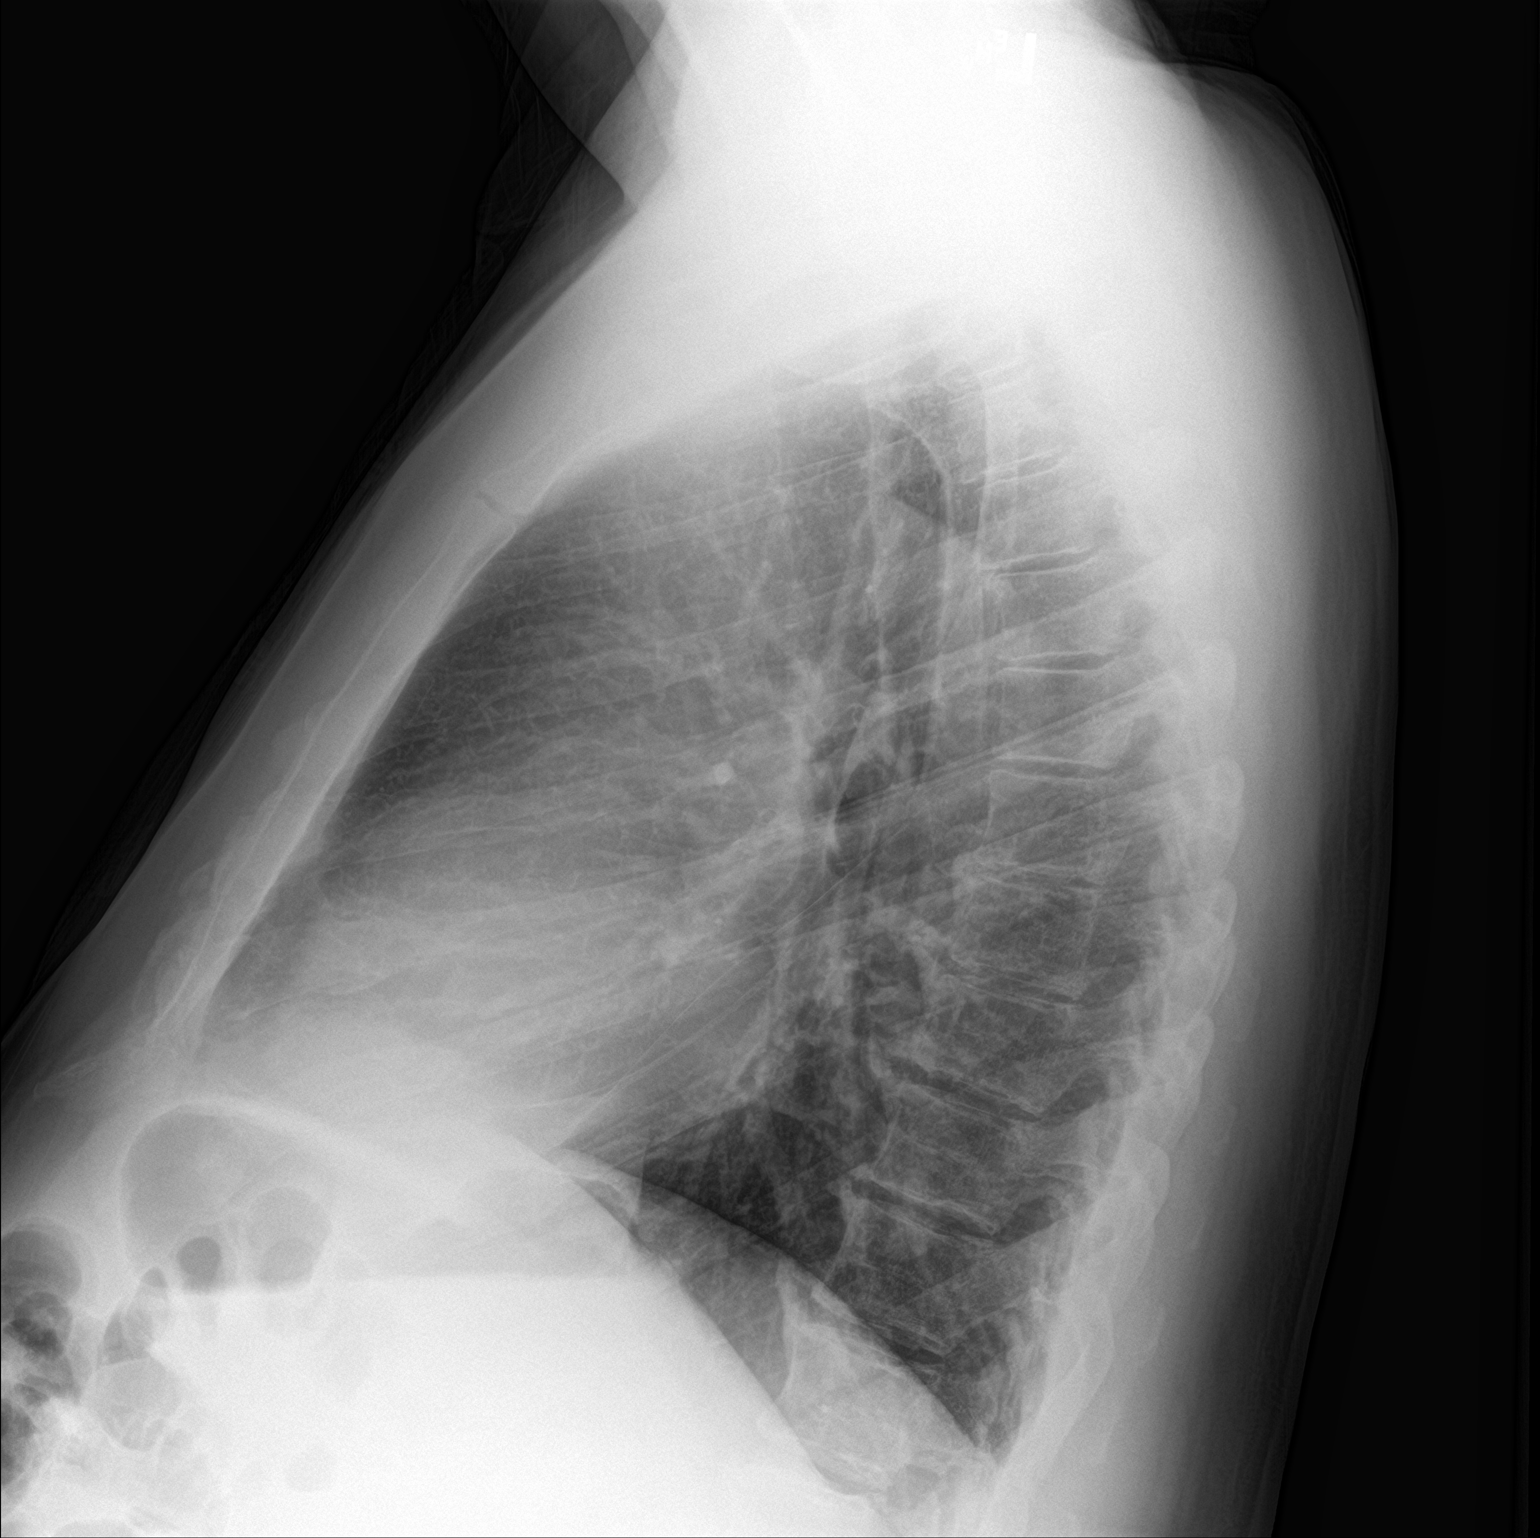

[2 of 2 positions shown; findings below may reference images not displayed]

FINDINGS: The heart size and mediastinal contours are within normal limits.
Both lungs are clear. The visualized skeletal structures are
unremarkable.
IMPRESSION: No active cardiopulmonary disease.

## 2019-03-26 ENCOUNTER — Other Ambulatory Visit: Payer: Self-pay | Admitting: Internal Medicine

## 2019-03-26 NOTE — Progress Notes (Signed)
Virtual Visit via Video Note  I connected with patient on 03/27/19 at  8:00 AM EST by audio enabled telemedicine application and verified that I am speaking with the correct person using two identifiers.   THIS ENCOUNTER IS A VIRTUAL VISIT DUE TO COVID-19 - PATIENT WAS NOT SEEN IN THE OFFICE. PATIENT HAS CONSENTED TO VIRTUAL VISIT / TELEMEDICINE VISIT   Location of patient: home  Location of provider: office  I discussed the limitations of evaluation and management by telemedicine and the availability of in person appointments. The patient expressed understanding and agreed to proceed.   Subjective:   Kirk Sutton is a 69 y.o. male who presents for Medicare Annual/Subsequent preventive examination.  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  Review of Systems:  Home Safety/Smoke Alarms: Feels safe in home. Smoke alarms in place.  Lives with wife in split level home. No trouble w/ stairs.   Male:   CCS- 02/18/17.  Recall in 3 years PSA-  Lab Results  Component Value Date   PSA 0.01 01/24/2009        Objective:    Vitals: Unable to assess. This visit is enabled though telemedicine due to Covid 19.   Advanced Directives 03/27/2019 12/24/2016 11/11/2015  Does Patient Have a Medical Advance Directive? Yes Yes Yes  Type of Paramedic of Hope;Living will [place];Living will Living will  Does patient want to make changes to medical advance directive? No - Patient declined - -  Copy of Center in Chart? No - copy requested - No - copy requested    Tobacco Social History   Tobacco Use  Smoking Status Never Smoker  Smokeless Tobacco Never Used     Counseling given: Not Answered   Clinical Intake: Pain : No/denies pain     Past Medical History:  Diagnosis Date  . Allergic rhinitis   . Asthma   . Chronic prostatitis    sees urology  . Diabetes mellitus    type 11 (a1c  6.0 03/2008)/no meds.  . Hernia, umbilical   . Hyperlipidemia   . Hypertension   . Psoriasis    @ hands, sees derm   Past Surgical History:  Procedure Laterality Date  . APPENDECTOMY    . FERTILITY SURGERY  1979  . HERNIA REPAIR    . NASAL SEPTUM SURGERY  2009  . SHOULDER SURGERY  2008   left  . TESTICLE SURGERY     undescended repair    Family History  Problem Relation Age of Onset  . Diabetes Sister   . Hypertension Mother   . Colon polyps Mother   . Hypertension Sister   . Heart attack Father        x 3 onset age 59, died at 63  . Colon polyps Father   . Cancer Father        bladder  . Colon cancer Neg Hx   . Prostate cancer Neg Hx   . Stomach cancer Neg Hx    Social History   Socioeconomic History  . Marital status: Married    Spouse name: Not on file  . Number of children: 2  . Years of education: Not on file  . Highest education level: Not on file  Occupational History  . Occupation: Administrator, arts  Tobacco Use  . Smoking status: Never Smoker  . Smokeless tobacco: Never Used  Substance and Sexual Activity  .  Alcohol use: Yes    Comment: socially, rare  . Drug use: No  . Sexual activity: Not on file  Other Topics Concern  . Not on file  Social History Narrative   Household-- pt, wife    Social Determinants of Health   Financial Resource Strain:   . Difficulty of Paying Living Expenses: Not on file  Food Insecurity:   . Worried About Charity fundraiser in the Last Year: Not on file  . Ran Out of Food in the Last Year: Not on file  Transportation Needs:   . Lack of Transportation (Medical): Not on file  . Lack of Transportation (Non-Medical): Not on file  Physical Activity:   . Days of Exercise per Week: Not on file  . Minutes of Exercise per Session: Not on file  Stress:   . Feeling of Stress : Not on file  Social Connections:   . Frequency of Communication with Friends and Family: Not on file  . Frequency of  Social Gatherings with Friends and Family: Not on file  . Attends Religious Services: Not on file  . Active Member of Clubs or Organizations: Not on file  . Attends Archivist Meetings: Not on file  . Marital Status: Not on file    Outpatient Encounter Medications as of 03/27/2019  Medication Sig  . acetaminophen (TYLENOL) 500 MG tablet Take 500 mg by mouth daily as needed for moderate pain.  [name] Kitchen albuterol (PROVENTIL HFA;VENTOLIN HFA) 108 (90 Base) MCG/ACT inhaler Inhale 2 puffs into the lungs every 6 (six) hours as needed for wheezing or shortness of breath.  [name] Kitchen ammonium lactate (AMLACTIN) 12 % lotion Apply 1 application topically as needed for dry skin.  . Ascorbic Acid (VITAMIN C) 1000 MG tablet Take 1,000 mg by mouth daily.  [name] Kitchen aspirin 81 MG tablet Take 81 mg by mouth 3 (three) times a week.   . budesonide-formoterol (SYMBICORT) 80-4.5 MCG/ACT inhaler Inhale 2 puffs into the lungs 2 (two) times daily.  [name] Kitchen buPROPion (WELLBUTRIN SR) 150 MG 12 hr tablet Take 300 mg by mouth daily.   . clobetasol ointment (TEMOVATE) [id number] % Apply 1 application topically 2 (two) times daily. For hands  . FLUoxetine (PROZAC) 40 MG capsule Take 1 tablet daily  . Fluticasone Furoate (FLONASE SENSIMIST NA) Place into the nose daily as needed.  . hydrochlorothiazide (HYDRODIURIL) 25 MG tablet Take 1 tablet (25 mg total) by mouth daily.  [name] Kitchen loratadine (CLARITIN) 10 MG tablet Take 10 mg by mouth daily.    . magnesium gluconate (MAGONATE) 500 MG tablet Take 500 mg by mouth 2 (two) times daily.  . Menthol, Topical Analgesic, (BIOFREEZE EX) Apply topically.  . metoprolol succinate (TOPROL-XL) 50 MG 24 hr tablet TAKE 1 TABLET(50 MG) BY MOUTH DAILY  . PAZEO 0.7 % SOLN INT 1 GTT IN OU QD  . pyridOXINE (VITAMIN B-6) 100 MG tablet Take 400 mg by mouth daily.   [name] Kitchen terazosin (HYTRIN) 1 MG capsule Take 3 capsules (3 mg total) by mouth daily.  . Turmeric 500 MG CAPS Take 1,000 mg by mouth daily.   . vitamin E (VITAMIN E) 400  UNIT capsule Take 450 Units by mouth daily.    No facility-administered encounter medications on file as of 03/27/2019.    Activities of Daily Living In your present state of health, do you have any difficulty performing the following activities: 03/27/2019 12/22/2018  Hearing? N N  Vision? N N  Difficulty concentrating or making decisions? N N  Walking or climbing stairs? N N  Dressing or bathing? N N  Doing errands, shopping? N N  Preparing Food and eating ? N -  Using the Toilet? N -  In the past six months, have you accidently leaked urine? N -  Do you have problems with loss of bowel control? N -  Managing your Medications? N -  Managing your Finances? N -  Housekeeping or managing your Housekeeping? N -  Some recent data might be hidden    Patient Care Team: Colon Branch, MD as PCP - General Thomasene Mohair Verner Chol., MD as Consulting Physician (Urology) Calvert Cantor, MD as Consulting Physician (Ophthalmology) Orbie Hurst, MD as Consulting Physician (Dermatology) Gardiner Barefoot, DPM as Consulting Physician (Podiatry) Roseanne Kaufman, MD as Consulting Physician (Orthopedic Surgery) Ricard Dillon, MD (Psychiatry) Suella Broad, MD as Consulting Physician (Physical Medicine and Rehabilitation)   Assessment:   This is a routine wellness examination for [place]. Physical assessment deferred to PCP.  Exercise Activities and Dietary recommendations Current Exercise Habits: The patient does not participate in regular exercise at present, Type of exercise: strength training/weights, Exercise limited by: orthopedic condition(s)(chronic back pain per pt.) Diet (meal preparation, eat out, water intake, caffeinated beverages, dairy products, fruits and vegetables): in general, a "healthy" diet  , well balanced   Goals    . Increase physical activity    . Weight (lb) < 215 lb (97.5 kg)       Fall Risk Fall Risk  03/27/2019 12/22/2018 05/22/2017 11/11/2015 09/07/2015  Falls in the  past year? 0 0 No No No  Follow up Education provided;Falls prevention discussed Falls evaluation completed - - -   Depression Screen PHQ 2/9 Scores 04/29/2018 05/22/2017 11/11/2015 09/07/2015  PHQ - 2 Score 1 0 0 0  PHQ- 9 Score 2 - - -    Cognitive Function Ad8 score reviewed for issues:  Issues making decisions:no  Less interest in hobbies / activities:no  Repeats questions, stories (family complaining):no  Trouble using ordinary gadgets (microwave, computer, phone):no  Forgets the month or year: no  Mismanaging finances: no  Remembering appts:no  Daily problems with thinking and/or memory:no Ad8 score is=0   MMSE - Mini Mental State Exam 11/11/2015  Orientation to time 5  Orientation to Place 5  Registration 3  Attention/ Calculation 5  Recall 3  Language- name 2 objects 2  Language- repeat 1  Language- follow 3 step command 3  Language- read & follow direction 1  Write a sentence 1  Copy design 1  Total score 30        Immunization History  Administered Date(s) Administered  . Fluad Quad(high Dose 65+) 12/22/2018  . H1N1 03/31/2008  . Influenza Split 01/22/2011, 01/10/2012  . Influenza Whole 01/17/2007, 12/24/2007, 11/17/2009  . Influenza, High Dose Seasonal PF 02/23/2015, 01/13/2016, 01/02/2017, 01/16/2018  . Influenza,inj,Quad PF,6+ Mos 01/20/2013, 03/08/2014  . Pneumococcal Conjugate-13 09/03/2014  . Pneumococcal Polysaccharide-23 09/01/2013  . Td 03/19/2001  . Tdap 08/22/2011  . Zoster 08/22/2011    Screening Tests Health Maintenance  Topic Date Due  . COLONOSCOPY  02/19/2020  . TETANUS/TDAP  08/21/2021  . INFLUENZA VACCINE  Completed  . Hepatitis C Screening  Completed  . PNA vac Low Risk Adult  Completed       Plan:   See you next year!  Continue to eat heart healthy diet (full of fruits, vegetables, whole grains, lean protein, water--limit salt, fat, and sugar intake) and increase physical activity as tolerated.  Continue doing brain  stimulating activities (puzzles, reading, adult coloring books, staying active) to keep memory sharp.   Bring a copy of your living will and/or healthcare power of attorney to your next office visit.    I have personally reviewed and noted the following in the patient's chart:   . Medical and social history . Use of alcohol, tobacco or illicit drugs  . Current medications and supplements . Functional ability and status . Nutritional status . Physical activity . Advanced directives . List of other physicians . Hospitalizations, surgeries, and ER visits in previous 12 months . Vitals . Screenings to include cognitive, depression, and falls . Referrals and appointments  In addition, I have reviewed and discussed with patient certain preventive protocols, quality metrics, and best practice recommendations. A written personalized care plan for preventive services as well as general preventive health recommendations were provided to patient.     Shela Nevin, [place]  03/27/2019

## 2019-03-27 ENCOUNTER — Encounter: Payer: Self-pay | Admitting: *Deleted

## 2019-03-27 ENCOUNTER — Ambulatory Visit (INDEPENDENT_AMBULATORY_CARE_PROVIDER_SITE_OTHER): Payer: Medicare HMO | Admitting: *Deleted

## 2019-03-27 ENCOUNTER — Other Ambulatory Visit: Payer: Self-pay

## 2019-03-27 DIAGNOSIS — Z0000 Encounter for general adult medical examination without abnormal findings: Secondary | ICD-10-CM

## 2019-03-27 NOTE — Patient Instructions (Signed)
See you next year!  Continue to eat heart healthy diet (full of fruits, vegetables, whole grains, lean protein, water--limit salt, fat, and sugar intake) and increase physical activity as tolerated.  Continue doing brain stimulating activities (puzzles, reading, adult coloring books, staying active) to keep memory sharp.   Bring a copy of your living will and/or healthcare power of attorney to your next office visit.   Kirk Sutton , Thank you for taking time to come for your Medicare Wellness Visit. I appreciate your ongoing commitment to your health goals. Please review the following plan we discussed and let me know if I can assist you in the future.   These are the goals we discussed: Goals    . DIET - INCREASE WATER INTAKE    . Increase physical activity    . Weight (lb) < 215 lb (97.5 kg)       This is a list of the screening recommended for you and due dates:  Health Maintenance  Topic Date Due  . Colon Cancer Screening  02/19/2020  . Tetanus Vaccine  08/21/2021  . Flu Shot  Completed  .  Hepatitis C: One time screening is recommended by Center for Disease Control  (CDC) for  adults born from [birth year] through 1967.   Completed  . Pneumonia vaccines  Completed    Preventive Care 69 Years and Older, Male Preventive care refers to lifestyle choices and visits with your health care provider that can promote health and wellness. This includes:  A yearly physical exam. This is also called an annual well check.  Regular dental and eye exams.  Immunizations.  Screening for certain conditions.  Healthy lifestyle choices, such as diet and exercise. What can I expect for my preventive care visit? Physical exam Your health care provider will check:  Height and weight. These may be used to calculate body mass index (BMI), which is a measurement that tells if you are at a healthy weight.  Heart rate and blood pressure.  Your skin for abnormal spots. Counseling Your health care  provider may ask you questions about:  Alcohol, tobacco, and drug use.  Emotional well-being.  Home and relationship well-being.  Sexual activity.  Eating habits.  History of falls.  Memory and ability to understand (cognition).  Work and work Statistician. What immunizations do I need?  Influenza (flu) vaccine  This is recommended every year. Tetanus, diphtheria, and pertussis (Tdap) vaccine  You may need a Td booster every 10 years. Varicella (chickenpox) vaccine  You may need this vaccine if you have not already been vaccinated. Zoster (shingles) vaccine  You may need this after age 73. Pneumococcal conjugate (PCV13) vaccine  One dose is recommended after age 4. Pneumococcal polysaccharide (PPSV23) vaccine  One dose is recommended after age 42. Measles, mumps, and rubella (MMR) vaccine  You may need at least one dose of MMR if you were born in 1957 or later. You may also need a second dose. Meningococcal conjugate (MenACWY) vaccine  You may need this if you have certain conditions. Hepatitis A vaccine  You may need this if you have certain conditions or if you travel or work in places where you may be exposed to hepatitis A. Hepatitis B vaccine  You may need this if you have certain conditions or if you travel or work in places where you may be exposed to hepatitis B. Haemophilus influenzae type b (Hib) vaccine  You may need this if you have certain conditions. You may receive  vaccines as individual doses or as more than one vaccine together in one shot (combination vaccines). Talk with your health care provider about the risks and benefits of combination vaccines. What tests do I need? Blood tests  Lipid and cholesterol levels. These may be checked every 5 years, or more frequently depending on your overall health.  Hepatitis C test.  Hepatitis B test. Screening  Lung cancer screening. You may have this screening every year starting at age 25 if you  have a 30-pack-year history of smoking and currently smoke or have quit within the past 15 years.  Colorectal cancer screening. All adults should have this screening starting at age 98 and continuing until age 63. Your health care provider may recommend screening at age 31 if you are at increased risk. You will have tests every 1-10 years, depending on your results and the type of screening test.  Prostate cancer screening. Recommendations will vary depending on your family history and other risks.  Diabetes screening. This is done by checking your blood sugar (glucose) after you have not eaten for a while (fasting). You may have this done every 1-3 years.  Abdominal aortic aneurysm (AAA) screening. You may need this if you are a current or former smoker.  Sexually transmitted disease (STD) testing. Follow these instructions at home: Eating and drinking  Eat a diet that includes fresh fruits and vegetables, whole grains, lean protein, and low-fat dairy products. Limit your intake of foods with high amounts of sugar, saturated fats, and salt.  Take vitamin and mineral supplements as recommended by your health care provider.  Do not drink alcohol if your health care provider tells you not to drink.  If you drink alcohol: ? Limit how much you have to 0-2 drinks a day. ? Be aware of how much alcohol is in your drink. In the [place] one drink equals one 12 oz bottle of beer (355 mL), one 5 oz glass of wine (148 mL), or one 1 oz glass of hard liquor (44 mL). Lifestyle  Take daily care of your teeth and gums.  Stay active. Exercise for at least 30 minutes on 5 or more days each week.  Do not use any products that contain nicotine or tobacco, such as cigarettes, e-cigarettes, and chewing tobacco. If you need help quitting, ask your health care provider.  If you are sexually active, practice safe sex. Use a condom or other form of protection to prevent STIs (sexually transmitted  infections).  Talk with your health care provider about taking a low-dose aspirin or statin. What's next?  Visit your health care provider once a year for a well check visit.  Ask your health care provider how often you should have your eyes and teeth checked.  Stay up to date on all vaccines. This information is not intended to replace advice given to you by your health care provider. Make sure you discuss any questions you have with your health care provider. Document Revised: 02/27/2018 Document Reviewed: 02/27/2018 Elsevier Patient Education  2020 [employer].

## 2019-03-27 NOTE — Telephone Encounter (Signed)
Last OV 18/21 Last refill 01/31/18 # 18 g / 5 Next OV 12/23/19

## 2019-04-20 DIAGNOSIS — M1812 Unilateral primary osteoarthritis of first carpometacarpal joint, left hand: Secondary | ICD-10-CM | POA: Diagnosis not present

## 2019-04-20 DIAGNOSIS — M1811 Unilateral primary osteoarthritis of first carpometacarpal joint, right hand: Secondary | ICD-10-CM | POA: Diagnosis not present

## 2019-04-20 DIAGNOSIS — M180 Bilateral primary osteoarthritis of first carpometacarpal joints: Secondary | ICD-10-CM | POA: Diagnosis not present

## 2019-05-17 ENCOUNTER — Other Ambulatory Visit: Payer: Self-pay | Admitting: Internal Medicine

## 2019-05-21 ENCOUNTER — Ambulatory Visit: Payer: Medicare HMO | Attending: Internal Medicine

## 2019-05-21 DIAGNOSIS — L2089 Other atopic dermatitis: Secondary | ICD-10-CM | POA: Diagnosis not present

## 2019-05-21 DIAGNOSIS — L821 Other seborrheic keratosis: Secondary | ICD-10-CM | POA: Diagnosis not present

## 2019-05-21 DIAGNOSIS — Z23 Encounter for immunization: Secondary | ICD-10-CM

## 2019-05-21 NOTE — Progress Notes (Signed)
   Covid-19 Vaccination Clinic  Name:  Kirk Sutton    MRN: [id number] DOB: 1950-07-05  05/21/2019  [name] was observed post Covid-19 immunization for 15 minutes without incident. He was provided with Vaccine Information Sheet and instruction to access the V-Safe system.   [name] was instructed to call 911 with any severe reactions post vaccine: [name] Kitchen Difficulty breathing  . Swelling of face and throat  . A fast heartbeat  . A bad rash all over body  . Dizziness and weakness   Immunizations Administered    Name Date Dose VIS Date Route   Pfizer COVID-19 Vaccine 05/21/2019  9:05 AM 0.3 mL 02/27/2019 Intramuscular   Manufacturer: [place]   Lot: [id number]   [place]: [id number]

## 2019-06-02 ENCOUNTER — Other Ambulatory Visit: Payer: Self-pay

## 2019-06-02 MED ORDER — HYDROCHLOROTHIAZIDE 25 MG PO TABS: 25.0000 mg | ORAL_TABLET | Freq: Every day | ORAL | 1 refills | Status: DC

## 2019-06-17 ENCOUNTER — Ambulatory Visit: Payer: Medicare HMO

## 2019-06-22 ENCOUNTER — Telehealth: Payer: Self-pay | Admitting: Internal Medicine

## 2019-06-22 ENCOUNTER — Encounter: Payer: Self-pay | Admitting: Internal Medicine

## 2019-06-22 ENCOUNTER — Telehealth (INDEPENDENT_AMBULATORY_CARE_PROVIDER_SITE_OTHER): Payer: Medicare HMO | Admitting: Internal Medicine

## 2019-06-22 VITALS — Ht 71.0 in

## 2019-06-22 DIAGNOSIS — J4531 Mild persistent asthma with (acute) exacerbation: Secondary | ICD-10-CM | POA: Diagnosis not present

## 2019-06-22 MED ORDER — AZITHROMYCIN 250 MG PO TABS: ORAL_TABLET | ORAL | 0 refills | Status: DC

## 2019-06-22 NOTE — Progress Notes (Signed)
Pre visit review using our clinic review tool, if applicable. No additional management support is needed unless otherwise documented below in the visit note. 

## 2019-06-22 NOTE — Chronic Care Management (AMB) (Signed)
°  Chronic Care Management   Note  06/22/2019 Name: Kirk Sutton MRN: [id number] DOB: September 17, 1950  Kirk Sutton is a 69 y.o. year old male who is a primary care patient of Colon Branch, MD. I reached out to Kirk Sutton by phone today in response to a referral sent by Mr. Arden Delcastillo Cadieux's PCP, Colon Branch, MD.   Mr. Mumper was given information about Chronic Care Management services today including:  1. CCM service includes personalized support from designated clinical staff supervised by his physician, including individualized plan of care and coordination with other care providers 2. 24/7 contact phone numbers for assistance for urgent and routine care needs. 3. Service will only be billed when office clinical staff spend 20 minutes or more in a month to coordinate care. 4. Only one practitioner may furnish and bill the service in a calendar month. 5. The patient may stop CCM services at any time (effective at the end of the month) by phone call to the office staff.   Patient agreed to services and verbal consent obtained.   Follow up plan:   Raynicia Dukes [employer] Scheduler

## 2019-06-22 NOTE — Progress Notes (Signed)
Subjective:    Patient ID: Kirk Sutton, male    DOB: [birth year], 69 y.o.   MRN: [id number]  DOS:  06/22/2019 Type of visit - description:     Virtual Visit via Telephone  Attempted  to make this a video visit, due to technical difficulties from the patient side it was not possible  thus we proceeded with a Virtual Visit via Telephone    I connected with above mentioned patient  by telephone and verified that I am speaking with the correct person using two identifiers.  THIS ENCOUNTER IS A VIRTUAL VISIT DUE TO COVID-19 - PATIENT WAS NOT SEEN IN THE OFFICE. PATIENT HAS CONSENTED TO VIRTUAL VISIT / TELEMEDICINE VISIT   Location of patient: home  Location of provider: office  I discussed the limitations, risks, security and privacy concerns of performing an evaluation and management service by telephone and the availability of in person appointments. I also discussed with the patient that there may be a patient responsible charge related to this service. The patient expressed understanding and agreed to proceed.  Acute The patient [place]. Last night he developed a sore throat, it was intense at times.  No difficulty swallowing or breathing. Today he has developed cough, some chest congestion and some wheezing. Good compliance with Symbicort twice a day. No sputum production.  Denies fever chills No nausea or vomiting Mild myalgias but not far from his normal Occasional headaches.    Review of Systems See above   Past Medical History:  Diagnosis Date  . Allergic rhinitis   . Asthma   . Chronic prostatitis    sees urology  . Diabetes mellitus    type 11 (a1c 6.0 03/2008)/no meds.  . Hernia, umbilical   . Hyperlipidemia   . Hypertension   . Psoriasis    @ hands, sees derm    Past Surgical History:  Procedure Laterality Date  . APPENDECTOMY    . FERTILITY SURGERY  1979  . HERNIA REPAIR    . NASAL SEPTUM SURGERY  2009  .  SHOULDER SURGERY  2008   left  . TESTICLE SURGERY     undescended repair     Allergies as of 06/22/2019      Reactions   Cetirizine Hcl    REACTION: prostatitis   Fexofenadine    REACTION: causes prostatitis      Medication List       Accurate as of June 22, 2019 11:59 PM. If you have any questions, ask your nurse or doctor.        acetaminophen 500 MG tablet Commonly known as: TYLENOL Take 500 mg by mouth daily as needed for moderate pain.   albuterol 108 (90 Base) MCG/ACT inhaler Commonly known as: VENTOLIN HFA INHALE 2 PUFFS INTO THE LUNGS EVERY 6 HOURS AS NEEDED FOR WHEEZING OR SHORTNESS OF BREATH   ammonium lactate 12 % lotion Commonly known as: AmLactin Apply 1 application topically as needed for dry skin.   aspirin 81 MG tablet Take 81 mg by mouth 3 (three) times a week.   azithromycin 250 MG tablet Commonly known as: Zithromax Z-Pak 2 tabs a day the first day, then 1 tab a day x 4 days Started by: Kathlene November, MD   Lynett Grimes EX Apply topically.   budesonide-formoterol 80-4.5 MCG/ACT inhaler Commonly known as: SYMBICORT Inhale 2 puffs into the lungs 2 (two) times daily.   buPROPion 150 MG 12 hr  tablet Commonly known as: WELLBUTRIN SR Take 300 mg by mouth daily.   clobetasol ointment 0.05 % Commonly known as: TEMOVATE Apply 1 application topically 2 (two) times daily. For hands   FLONASE SENSIMIST NA Place into the nose daily as needed.   FLUoxetine 40 MG capsule Commonly known as: PROZAC Take 1 tablet daily   hydrochlorothiazide 25 MG tablet Commonly known as: HYDRODIURIL Take 1 tablet (25 mg total) by mouth daily.   loratadine 10 MG tablet Commonly known as: CLARITIN Take 10 mg by mouth daily.   magnesium gluconate 500 MG tablet Commonly known as: MAGONATE Take 500 mg by mouth 2 (two) times daily.   metoprolol succinate 50 MG 24 hr tablet Commonly known as: TOPROL-XL TAKE 1 TABLET(50 MG) BY MOUTH DAILY   [place] 0.7 % Soln Generic drug:  Olopatadine HCl INT 1 GTT IN OU QD   pyridOXINE 100 MG tablet Commonly known as: VITAMIN B-6 Take 400 mg by mouth daily.   terazosin 1 MG capsule Commonly known as: HYTRIN Take 3 capsules (3 mg total) by mouth daily.   Turmeric 500 MG Caps Take 1,000 mg by mouth daily.   vitamin C 1000 MG tablet Take 1,000 mg by mouth daily.   vitamin E 180 MG (400 UNITS) capsule Generic drug: vitamin E Take 450 Units by mouth daily.          Objective:   Physical Exam Ht 5\' 11"  (1.803 m)   BMI 32.36 kg/m  This is a virtual telephone visit, alert oriented x3, few times he coughed during the visit, I did notice some large airway congestion, no wheezing per se.  No respiratory distress, speaking in complete sentences    Assessment    Assessment Prediabetes  HTN Hyperlipidemia Anxiety Dr Fay Records (psych) Rx prozac ~12-2014  Asthma: used symbicort before (~2015) , now on alb "prn" MSK:  --Chronic back pain, pain medication as needed, used to see Dr Nelva Bush, did PT @ [facility] [phone], better --DJD- hands Chronic prostatitis, sees urology Psoriasis, hands, sees dermatology Snoring (severe per pt)- dentist Rx a moth guard ~ 08-2015, helps  PLAN: Asthma exacerbation: Symptoms consistent with asthma exacerbation, he has cough, some wheezing, chest congestion and a sore throat.  No fever chills. Plan: Advised to be checked for Covid, contact information provided. Next Covid injection is tomorrow, recommend to hold for few days until he feels better Continue Symbicort, liberalize the use of albuterol for persistent cough or wheezing Zithromax Continue Mucinex Call if not gradually better, call if symptoms severe. HTN: Reports his BP few days ago was 125/84.     I discussed the assessment and treatment plan with the patient. The patient was provided an opportunity to ask questions and all were answered. The patient agreed with the plan and demonstrated an understanding of the  instructions.   The patient was advised to call back or seek an in-person evaluation if the symptoms worsen or if the condition fails to improve as anticipated.  I provided 20 minutes of non-face-to-face time during this encounter.  Kathlene November, MD

## 2019-06-23 ENCOUNTER — Telehealth: Payer: Self-pay

## 2019-06-23 ENCOUNTER — Ambulatory Visit: Payer: Medicare HMO

## 2019-06-23 NOTE — Telephone Encounter (Signed)
Patient called in needing to speak with Dr. Larose Kells nurse please give the patient a call as soon as possible at [phone] thanks,

## 2019-06-23 NOTE — Telephone Encounter (Signed)
Further information needed please.

## 2019-06-24 ENCOUNTER — Ambulatory Visit: Payer: Medicare HMO | Attending: Internal Medicine

## 2019-06-24 DIAGNOSIS — Z20822 Contact with and (suspected) exposure to covid-19: Secondary | ICD-10-CM

## 2019-06-24 NOTE — Assessment & Plan Note (Signed)
Asthma exacerbation: Symptoms consistent with asthma exacerbation, he has cough, some wheezing, chest congestion and a sore throat.  No fever chills. Plan: Advised to be checked for Covid, contact information provided. Next Covid injection is tomorrow, recommend to hold for few days until he feels better Continue Symbicort, liberalize the use of albuterol for persistent cough or wheezing Zithromax Continue Mucinex Call if not gradually better, call if symptoms severe. HTN: Reports his BP few days ago was 125/84.

## 2019-06-25 LAB — NOVEL CORONAVIRUS, NAA: SARS-CoV-2, NAA: NOT DETECTED

## 2019-06-25 LAB — SARS-COV-2, NAA 2 DAY TAT

## 2019-06-29 ENCOUNTER — Telehealth: Payer: Self-pay | Admitting: Internal Medicine

## 2019-06-29 MED ORDER — HYDROCODONE-HOMATROPINE 5-1.5 MG/5ML PO SYRP: 5.0000 mL | ORAL_SOLUTION | Freq: Two times a day (BID) | ORAL | 0 refills | Status: DC | PRN

## 2019-06-29 NOTE — Telephone Encounter (Signed)
Pt states that he was last seen last Monday.. But he still has his nagging cough.. And would like for Paz to call him something in for his cough.  Please advise . Appt declined

## 2019-06-29 NOTE — Telephone Encounter (Signed)
Please advise 

## 2019-06-29 NOTE — Telephone Encounter (Signed)
Advise patient: He had asthma exacerbation, had Zithromax already, continue Symbicort, albuterol, Mucinex, will add hydrocodone to be used only as needed for persistent cough, will cause drowsiness. He also needed a Covid test, recommend to pursue.

## 2019-06-29 NOTE — Telephone Encounter (Signed)
COVID test negative on 06/24/19. Spoke w/ Pt- informed of recommendations. Pt verbalized understanding.

## 2019-06-30 ENCOUNTER — Telehealth: Payer: Self-pay

## 2019-06-30 NOTE — Telephone Encounter (Signed)
Patient called in to see if Dr. Larose Kells can give him a call. The patient is concerned about taking the second Covid-19 vaccine sense he has a bad cough and he is taking strong medication for his cough. Please call the patient back at [phone]

## 2019-07-01 ENCOUNTER — Ambulatory Visit: Payer: Medicare HMO

## 2019-07-01 NOTE — Telephone Encounter (Signed)
Spoke w/ Pt- informed of recommendations. Pt verbalized understanding.  

## 2019-07-01 NOTE — Telephone Encounter (Signed)
Agree, recommend to postpone Covid vaccination for 2 weeks.  I do not think that will affect in any way that protection he will get from the shot.

## 2019-07-09 NOTE — Telephone Encounter (Signed)
Patient states he is still having cough spells.  And needs to know if something else can be called in for him  Pease advise

## 2019-07-13 ENCOUNTER — Ambulatory Visit: Payer: Medicare HMO | Attending: Internal Medicine

## 2019-07-13 ENCOUNTER — Other Ambulatory Visit: Payer: Self-pay

## 2019-07-13 DIAGNOSIS — E785 Hyperlipidemia, unspecified: Secondary | ICD-10-CM

## 2019-07-13 DIAGNOSIS — I10 Essential (primary) hypertension: Secondary | ICD-10-CM

## 2019-07-13 DIAGNOSIS — Z23 Encounter for immunization: Secondary | ICD-10-CM

## 2019-07-13 DIAGNOSIS — R739 Hyperglycemia, unspecified: Secondary | ICD-10-CM

## 2019-07-13 NOTE — Progress Notes (Signed)
   Covid-19 Vaccination Clinic  Name:  CALIB WEGLEY    MRN: [id number] DOB: Mar 19, 1951  07/13/2019  Mr. Mohring was observed post Covid-19 immunization for 15 minutes without incident. He was provided with Vaccine Information Sheet and instruction to access the V-Safe system.   Mr. Palko was instructed to call 911 with any severe reactions post vaccine: [name] Kitchen Difficulty breathing  . Swelling of face and throat  . A fast heartbeat  . A bad rash all over body  . Dizziness and weakness   Immunizations Administered    Name Date Dose VIS Date Route   Pfizer COVID-19 Vaccine 07/13/2019  1:55 PM 0.3 mL 05/13/2018 Intramuscular   Manufacturer: [place]   Lot: [id number]   [place]: [id number]

## 2019-07-14 NOTE — Telephone Encounter (Signed)
Patient states that he is still having a slight cough sometimes lingering. Its not keeping him up. But he just wanted Paz to be aware

## 2019-07-14 NOTE — Telephone Encounter (Signed)
Okay, thank you!

## 2019-07-14 NOTE — Telephone Encounter (Signed)
FYI

## 2019-07-15 ENCOUNTER — Other Ambulatory Visit: Payer: Self-pay

## 2019-07-15 ENCOUNTER — Ambulatory Visit: Payer: Medicare HMO | Admitting: Pharmacist

## 2019-07-15 DIAGNOSIS — I10 Essential (primary) hypertension: Secondary | ICD-10-CM

## 2019-07-15 DIAGNOSIS — R739 Hyperglycemia, unspecified: Secondary | ICD-10-CM

## 2019-07-15 DIAGNOSIS — E785 Hyperlipidemia, unspecified: Secondary | ICD-10-CM

## 2019-07-15 NOTE — Patient Instructions (Addendum)
Visit Information  Goals Addressed            This Visit's Progress   . A1c goal less than 6.5%      . Blood pressure goal less than 140/90      . Check blood pressure 1-2 [facility] or at [facility] and record readings      . Consider practicing meditation to help with anxiety and to lower blood pressure      . Pharmacy Care Plan       CARE PLAN ENTRY  Current Barriers:  . Chronic Disease Management support, education, and care coordination needs related to  Asthma, Pre-Diabetes, Hypertension, Hyperlipidemia, Anxiety, BPH, Allergies  Pharmacist Clinical Goal(s):  [name] Kitchen Hypertension o Over the next 90 days patient will have a blood pressure less than 140/90 . Pre-Diabetes o Over the next 180 days patient will maintain an a1c of less than 6.5% . Hyperlipidemia/ASCVD Risk o Over the next 180 days patient will lower his ASCVD Risk by controlling blood pressure and/or considering starting a statin medication  Interventions: . Comprehensive medication review performed. . Hypertension o Check blood pressure 1-2 times per week o Discussed benefits of meditation to anxiety and blood pressure . Pre-Diabetes o Discussed importance of diet and exercise to maintain a1c <6.5% . Hyperlipidemia/ASCVD Risk o Discussed importance of lowering ASCVD risk including risk/benefit of initiating statin therapy and controlling blood pressure  Patient Self Care Activities:  . Patient verbalizes understanding of plan to follow as described above, Self administers medications as prescribed, Calls pharmacy for medication refills, and Calls provider office for new concerns or questions . Hypertension o Over the next 90 days patient will check blood pressure 1-2 times per week at [facility] or at home with a working blood pressure cuff and record readings  . Pre-Diabetes o Over the next 180 days patient will limit the amount of carbohydrates eaten . Hyperlipidemia/ASCVD Risk o Over the next 180 days  patient will help to lower their ASCVD risk by lowering their blood pressure (taking blood pressure medication appropriately and practicing meditation to help control anxiety that can increase blood pressure)  Initial goal documentation        Kirk Sutton was given information about Chronic Care Management services today including:  1. CCM service includes personalized support from designated clinical staff supervised by his physician, including individualized plan of care and coordination with other care providers 2. 24/7 contact phone numbers for assistance for urgent and routine care needs. 3. Standard insurance, coinsurance, copays and deductibles apply for chronic care management only during months in which we provide at least 20 minutes of these services. Most insurances cover these services at 100%, however patients may be responsible for any copay, coinsurance and/or deductible if applicable. This service may help you avoid the need for more expensive face-to-face services. 4. Only one practitioner may furnish and bill the service in a calendar month. 5. The patient may stop CCM services at any time (effective at the end of the month) by phone call to the office staff.  Patient agreed to services and verbal consent obtained.   The patient verbalized understanding of instructions provided today and agreed to receive a mailed copy of patient instruction and/or educational materials. Telephone follow up appointment with pharmacy team member scheduled for: 08/20/2019  Melvenia Beam Thelma Lorenzetti, PharmD Clinical Pharmacist [facility] [phone]   Mindfulness-Based Stress Reduction Mindfulness-based stress reduction (MBSR) is a program that helps  people learn to practice mindfulness. Mindfulness is the practice of intentionally paying attention to the present moment. It can be learned and practiced through techniques such as education, breathing exercises, meditation, and  yoga. MBSR includes several mindfulness techniques in one program. MBSR works best when you understand the treatment, are willing to try new things, and can commit to spending time practicing what you learn. MBSR training may include learning about:  How your emotions, thoughts, and reactions affect your body.  New ways to respond to things that cause negative thoughts to start (triggers).  How to notice your thoughts and let go of them.  Practicing awareness of everyday things that you normally do without thinking.  The techniques and goals of different types of meditation. What are the benefits of MBSR? MBSR can have many benefits, which include helping you to:  Develop self-awareness. This refers to knowing and understanding yourself.  Learn skills and attitudes that help you to participate in your own health care.  Learn new ways to care for yourself.  Be more accepting about how things are, and let things go.  Be less judgmental and approach things with an open mind.  Be patient with yourself and trust yourself more. MBSR has also been shown to:  Reduce negative emotions, such as depression and anxiety.  Improve memory and focus.  Change how you sense and approach pain.  Boost your body's ability to fight infections.  Help you connect better with other people.  Improve your sense of well-being. Follow these instructions at home:   Find a local in-person or online MBSR program.  Set aside some time regularly for mindfulness practice.  Find a mindfulness practice that works best for you. This may include one or more of the following: ? Meditation. Meditation involves focusing your mind on a certain thought or activity. ? Breathing awareness exercises. These help you to stay present by focusing on your breath. ? Body scan. For this practice, you lie down and pay attention to each part of your body from head to toe. You can identify tension and soreness and  intentionally relax parts of your body. ? Yoga. Yoga involves stretching and breathing, and it can improve your ability to move and be flexible. It can also provide an experience of testing your body's limits, which can help you release stress. ? Mindful eating. This way of eating involves focusing on the taste, texture, color, and smell of each bite of food. Because this slows down eating and helps you feel full sooner, it can be an important part of a weight-loss plan.  Find a podcast or recording that provides guidance for breathing awareness, body scan, or meditation exercises. You can listen to these any time when you have a free moment to rest without distractions.  Follow your treatment plan as told by your health care provider. This may include taking regular medicines and making changes to your diet or lifestyle as recommended. How to practice mindfulness To do a basic awareness exercise:  Find a comfortable place to sit.  Pay attention to the present moment. Observe your thoughts, feelings, and surroundings just as they are.  Avoid placing judgment on yourself, your feelings, or your surroundings. Make note of any judgment that comes up, and let it go.  Your mind may wander, and that is okay. Make note of when your thoughts drift, and return your attention to the present moment. To do basic mindfulness meditation:  Find a comfortable place to sit. This  may include a stable chair or a firm floor cushion. ? Sit upright with your back straight. Let your arms fall next to your side with your hands resting on your legs. ? If sitting in a chair, rest your feet flat on the floor. ? If sitting on a cushion, cross your legs in front of you.  Keep your head in a neutral position with your chin dropped slightly. Relax your jaw and rest the tip of your tongue on the roof of your mouth. Drop your gaze to the floor. You can close your eyes if you like.  Breathe normally and pay attention to  your breath. Feel the air moving in and out of your nose. Feel your belly expanding and relaxing with each breath.  Your mind may wander, and that is okay. Make note of when your thoughts drift, and return your attention to your breath.  Avoid placing judgment on yourself, your feelings, or your surroundings. Make note of any judgment or feelings that come up, let them go, and bring your attention back to your breath.  When you are ready, lift your gaze or open your eyes. Pay attention to how your body feels after the meditation. Where to find more information You can find more information about MBSR from:  Your health care provider.  Community-based meditation centers or programs.  Programs offered near you. Summary  Mindfulness-based stress reduction (MBSR) is a program that teaches you how to intentionally pay attention to the present moment. It is used with other treatments to help you cope better with daily stress, emotions, and pain.  MBSR focuses on developing self-awareness, which allows you to respond to life stress without judgment or negative emotions.  MBSR programs may involve learning different mindfulness practices, such as breathing exercises, meditation, yoga, body scan, or mindful eating. Find a mindfulness practice that works best for you, and set aside time for it on a regular basis. This information is not intended to replace advice given to you by your health care provider. Make sure you discuss any questions you have with your health care provider. Document Revised: 02/15/2017 Document Reviewed: 07/12/2016 Elsevier Patient Education  [place].  How to Take Your Blood Pressure You can take your blood pressure at home with a machine. You may need to check your blood pressure at home:  To check if you have high blood pressure (hypertension).  To check your blood pressure over time.  To make sure your blood pressure medicine is working. Supplies  needed: You will need a blood pressure machine, or monitor. You can buy one at a drugstore or online. When choosing one:  Choose one with an arm cuff.  Choose one that wraps around your upper arm. Only one finger should fit between your arm and the cuff.  Do not choose one that measures your blood pressure from your wrist or finger. Your doctor can suggest a monitor. How to prepare Avoid these things for 30 minutes before checking your blood pressure:  Drinking caffeine.  Drinking alcohol.  Eating.  Smoking.  Exercising. Five minutes before checking your blood pressure:  Pee.  Sit in a dining chair. Avoid sitting in a soft couch or armchair.  Be quiet. Do not talk. How to take your blood pressure Follow the instructions that came with your machine. If you have a digital blood pressure monitor, these may be the instructions: 1. Sit up straight. 2. Place your feet on the floor. Do not cross your ankles  or legs. 3. Rest your left arm at the level of your heart. You may rest it on a table, desk, or chair. 4. Pull up your shirt sleeve. 5. Wrap the blood pressure cuff around the upper part of your left arm. The cuff should be 1 inch (2.5 cm) above your elbow. It is best to wrap the cuff around bare skin. 6. Fit the cuff snugly around your arm. You should be able to place only one finger between the cuff and your arm. 7. Put the cord inside the groove of your elbow. 8. Press the power button. 9. Sit quietly while the cuff fills with air and loses air. 10. Write down the numbers on the screen. 11. Wait 2-3 minutes and then repeat steps 1-10. What do the numbers mean? Two numbers make up your blood pressure. The first number is called systolic pressure. The second is called diastolic pressure. An example of a blood pressure reading is "120 over 80" (or 120/80). If you are an adult and do not have a medical condition, use this guide to find out if your blood pressure is  normal: Normal  First number: below 120.  Second number: below 80. Elevated  First number: 120-129.  Second number: below 80. Hypertension stage 1  First number: 130-139.  Second number: 80-89. Hypertension stage 2  First number: 140 or above.  Second number: 61 or above. Your blood pressure is above normal even if only the top or bottom number is above normal. Follow these instructions at home:  Check your blood pressure as often as your doctor tells you to.  Take your monitor to your next doctor's appointment. Your doctor will: ? Make sure you are using it correctly. ? Make sure it is working right.  Make sure you understand what your blood pressure numbers should be.  Tell your doctor if your medicines are causing side effects. Contact a doctor if:  Your blood pressure keeps being high. Get help right away if:  Your first blood pressure number is higher than 180.  Your second blood pressure number is higher than 120. This information is not intended to replace advice given to you by your health care provider. Make sure you discuss any questions you have with your health care provider. Document Revised: 02/15/2017 Document Reviewed: 08/12/2015 Elsevier Patient Education  2020 [employer].

## 2019-07-15 NOTE — Chronic Care Management (AMB) (Signed)
Chronic Care Management Pharmacy  Name: SINCEAR TANTON  MRN: [id number] DOB: 06/01/1950   Chief Complaint/ HPI  [name],  69 y.o. , male presents for their Initial CCM visit with the clinical pharmacist via telephone due to COVID-19 Pandemic.  PCP : Colon Branch, MD  Their chronic conditions include: Asthma, Pre-Diabetes, Hypertension, Hyperlipidemia, Anxiety, BPH, Allergies  Office Visits: 06/22/19: Visit w/ Dr. Larose Kells - Asthma exacerbation. Advised to get tested for COVID. Prescribed zpak and continue mucinex  03/27/19: Medicare Annual Wellness Exam w/ Naaman Plummer, RN - Updated goals to increase water intake, increase physical activity and goal weight of <215lbs.   Consult Visit: 05/21/19: Derm visit w/ Dr. Tamala Julian  04/20/19: Hand surgery visit w/ Dr. Amedeo Plenty  02/24/19: Psych visit w/ Dr. [name]  02/18/19: Urology visit w/ Dr. Thomasene Mohair - Discussed sildenafil. No med changes noted.   02/04/19: Osteopathic Med visit w/ Dr. Jerline Pain    Medications: Outpatient Encounter Medications as of 07/15/2019  Medication Sig Note  . acetaminophen (TYLENOL) 500 MG tablet Take 500 mg by mouth daily as needed for moderate pain. 12/05/2015: PRN  . albuterol (VENTOLIN HFA) 108 (90 Base) MCG/ACT inhaler INHALE 2 PUFFS INTO THE LUNGS EVERY 6 HOURS AS NEEDED FOR WHEEZING OR SHORTNESS OF BREATH   . Ascorbic Acid (VITAMIN C) 1000 MG tablet Take 1,000 mg by mouth daily.   [name] Kitchen aspirin 81 MG tablet Take 81 mg by mouth 3 (three) times a week.    . budesonide-formoterol (SYMBICORT) 80-4.5 MCG/ACT inhaler Inhale 2 puffs into the lungs 2 (two) times daily.   [name] Kitchen buPROPion (WELLBUTRIN SR) 150 MG 12 hr tablet Take 300 mg by mouth daily. Bupropion XL 300mg  once daily   . clobetasol ointment (TEMOVATE) AB-123456789 % Apply 1 application topically 2 (two) times daily. For hands   . FLUoxetine (PROZAC) 40 MG capsule Take 1 tablet daily   . Fluticasone Furoate (FLONASE SENSIMIST NA) Place into the nose daily as needed.   .  hydrochlorothiazide (HYDRODIURIL) 25 MG tablet Take 1 tablet (25 mg total) by mouth daily.   [name] Kitchen loratadine (CLARITIN) 10 MG tablet Take 10 mg by mouth daily.     . Magnesium Gluconate 250 MG TABS Take 500 mg by mouth daily.    . metoprolol succinate (TOPROL-XL) 50 MG 24 hr tablet TAKE 1 TABLET(50 MG) BY MOUTH DAILY   . PAZEO 0.7 % SOLN INT 1 GTT IN OU QD 02/23/2015: PRN  . pyridOXINE (VITAMIN B-6) 100 MG tablet Take 400 mg by mouth daily. Super B complex   . terazosin (HYTRIN) 1 MG capsule Take 3 capsules (3 mg total) by mouth daily.   . Turmeric 500 MG CAPS Take 500 mg by mouth daily.    . vitamin E (VITAMIN E) 400 UNIT capsule Take 450 Units by mouth daily.    [name] Kitchen ammonium lactate (AMLACTIN) 12 % lotion Apply 1 application topically as needed for dry skin. (Patient not taking: Reported on 07/15/2019) 12/05/2015: PRN  . azithromycin (ZITHROMAX Z-PAK) 250 MG tablet 2 tabs a Deldrick Linch the first Monchel Pollitt, then 1 tab a Eustace Hur x 4 days (Patient not taking: Reported on 07/15/2019)   . HYDROcodone-homatropine (HYCODAN) 5-1.5 MG/5ML syrup Take 5 mLs by mouth 2 (two) times daily as needed for cough (.  Will cause drowsiness). (Patient not taking: Reported on 07/15/2019)   . Menthol, Topical Analgesic, (BIOFREEZE EX) Apply topically.    No facility-administered encounter medications on file as of 07/15/2019.   SDOH Screenings  Alcohol Screen:   . Last Alcohol Screening Score (AUDIT):   Depression (PHQ2-9): Low Risk   . PHQ-2 Score: 0  Financial Resource Strain: Low Risk   . Difficulty of Paying Living Expenses: Not hard at all  Food Insecurity:   . Worried About Charity fundraiser in the Last Year:   . [place] in the Last Year:   Housing:   . Last Housing Risk Score:   Physical Activity:   . Days of Exercise per Week:   . Minutes of Exercise per Session:   Social Connections:   . Frequency of Communication with Friends and Family:   . Frequency of Social Gatherings with Friends and Family:   . Attends  Religious Services:   . Active Member of Clubs or Organizations:   . Attends Archivist Meetings:   [name] Kitchen Marital Status:   Stress:   . Feeling of Stress :   Tobacco Use: Low Risk   . Smoking Tobacco Use: Never Smoker  . Smokeless Tobacco Use: Never Used  Transportation Needs:   . Film/video editor (Medical):   [name] Kitchen Lack of Transportation (Non-Medical):      Current Diagnosis/Assessment:  Goals Addressed            This Visit's Progress   . A1c goal less than 6.5%      . Blood pressure goal less than 140/90      . Check blood [facility] or at [facility] and record readings      . Consider practicing meditation to help with anxiety and to lower blood pressure      . Pharmacy Care Plan       CARE PLAN ENTRY  Current Barriers:  . Chronic Disease Management support, education, and care coordination needs related to  Asthma, Pre-Diabetes, Hypertension, Hyperlipidemia, Anxiety, BPH, Allergies  Pharmacist Clinical Goal(s):  [name] Kitchen Hypertension o Over the next 90 days patient will have a blood pressure less than 140/90 . Pre-Diabetes o Over the next 180 days patient will maintain an a1c of less than 6.5% . Hyperlipidemia/ASCVD Risk o Over the next 180 days patient will lower his ASCVD Risk by controlling blood pressure and/or considering starting a statin medication  Interventions: . Comprehensive medication review performed. . Hypertension o Check blood pressure 1-2 times per week o Discussed benefits of meditation to anxiety and blood pressure . Pre-Diabetes o Discussed importance of diet and exercise to maintain a1c <6.5% . Hyperlipidemia/ASCVD Risk o Discussed importance of lowering ASCVD risk including risk/benefit of initiating statin therapy and controlling blood pressure  Patient Self Care Activities:  . Patient verbalizes understanding of plan to follow as described above, Self administers medications as prescribed, Calls pharmacy for  medication refills, and Calls provider office for new concerns or questions . Hypertension o Over the next 90 days patient will check blood pressure 1-2 times per [facility] or at home with a working blood pressure cuff and record readings  . Pre-Diabetes o Over the next 180 days patient will limit the amount of carbohydrates eaten . Hyperlipidemia/ASCVD Risk o Over the next 180 days patient will help to lower their ASCVD risk by lowering their blood pressure (taking blood pressure medication appropriately and practicing meditation to help control anxiety that can increase blood pressure)  Initial goal documentation      Social Hx:   Married Uses a pill box. Fills it out weekly   Asthma /  Tobacco   Last spirometry score: None noted   Eosinophil count:   Lab Results  Component Value Date/Time   EOSPCT 1.4 12/22/2018 01:33 PM  %                               Eos (Absolute):  Lab Results  Component Value Date/Time   EOSABS 0.1 12/22/2018 01:33 PM    Tobacco Status:  Social History   Tobacco Use  Smoking Status Never Smoker  Smokeless Tobacco Never Used    Patient has failed these meds in past: None noted  Patient is currently controlled on the following medications: symbicort 80-4.5 2 puffs twice daily, albuterol PRN Using maintenance inhaler regularly? Yes Frequency of rescue inhaler use:  1-2x per week   Has been using albuterol since he has been coughing more causing a wheeze  Rinses mouth with Listerine after using symbicort. Has run out of listerine. Encouraged patient to rinse mouth out with water if he doesn't have listerine.  Plan -Continue current medications   Pre-Diabetes   Recent Relevant Labs: Lab Results  Component Value Date/Time   HGBA1C 6.1 12/25/2018 10:01 AM   HGBA1C 6.0 11/21/2017 09:09 AM   MICROALBUR 0.1 07/20/2010 09:06 AM   MICROALBUR 0.2 02/16/2009 09:27 AM    Patient has failed these meds in past: None noted  Patient is  currently controlled on the following medications: None  Last diabetic Foot exam:  Lab Results  Component Value Date/Time   HMDIABEYEEXA normal 10/17/2008 12:00 AM    Last diabetic Eye exam:  Lab Results  Component Value Date/Time   HMDIABFOOTEX yes 02/16/2009 12:00 AM    Patient states he has been watching what he eats.  His sister is a Microbiologist and has gone over diet with him before. He has incorporated some of her suggestions. H has tried to eliminate french fries and eat greens.  We discussed: diet and exercise extensively  Plan -Continue control with diet and exercise   Hypertension   BP today is: Unable to assess due to phone visit  Office blood pressures are  BP Readings from Last 3 Encounters:  12/22/18 (!) 158/86  04/29/18 122/78  03/24/18 122/78    Patient has failed these meds in the past: None noted  Patient is currently controlled on the following medications: hctz 25mg  daily AM, metoprolol succinate 50mg  daily HS  Patient checks BP at home infrequently  Patient home BP readings are ranging: N/A  Used to work out at [employer] and would check his BP there. [facility] [place].  Reports his BP has been within goal at dentist's office. Feels like he has a lot of back pain that can increase his BP. Going back to pain management in May to get another back injection  We discussed Proper blood pressure measurement technique and the benefits of meditation to blood pressure and anxiety  Plan -Start checking BP 1-2 days per week at the [facility] or at home and record readings -Continue current medications   Hyperlipidemia/ASCVD Risk   Lipid Panel     Component Value Date/Time   CHOL 170 12/22/2018 1333   TRIG 122.0 12/22/2018 1333   TRIG 52 02/20/2006 1053   HDL 46.30 12/22/2018 1333   CHOLHDL 4 12/22/2018 1333   VLDL 24.4 12/22/2018 1333   [place] 99 12/22/2018 1333     The 10-year ASCVD risk score Mikey Bussing DC Brooke Bonito., et  al., 2013) is: 29.6%   Values  used to calculate the score:     Age: 24 years     Sex: Male     Is Non-Hispanic African American: No     Diabetic: No     Tobacco smoker: No     Systolic Blood Pressure: 0000000 mmHg     Is BP treated: Yes     HDL Cholesterol: 46.3 mg/dL     Total Cholesterol: 170 mg/dL   Patient has failed these meds in past: None noted  Patient is currently controlled on the following medications: aspirin 81mg  M, W, F  We discussed:  Risk/benefit of statin therapy in light of patient's ASCVD Risk score  Plan -Continue current medications   Future Plan -Consider statin therapy considering ASCVD Risk. Will work to improve pt's BP to see how much this lowers his ASCVD Risk score.   Anxiety    Patient has failed these meds in past: None noted  Patient is currently controlled on the following medications: fluoxetine 40mg  daily, bupropion XL 300mg  daily  Followed by Dr. [name]. Next appt is 08/15/19.  He states his wife does not feel his current regimen is working (because he talks a lot in the evening), but the patient does feel like his regimen is working.  Doesn't feel like he has as much impulsivity as in the past Feels he has ADHD He states he has not discussed meditation before.  We discussed:  Benefits of meditation and yoga to help with anxiety and relaxation to help with BP  Plan -Continue current medications   BPH    Patient has failed these meds in past: None noted  Patient is currently controlled on the following medications: terazosin 1mg  #3 daily HS  Followed by urology Usually doesn't have to get up at night to urinate, but this week he has gotten up more. He does report that he has been drinking more fluid before going to bed.  Feels his regimen is working.  Plan -Continue current medications   Allergic Rhinitis     Patient has failed these meds in past: None noted  Patient is currently uncontrolled on the following medications: loratadine 10mg  daily AM, Mucinex DM 600mg   BID, flonase daily  Seeing an allergist on 07/20/19. Dr. Salvatore Decent? Feels his current cough could be related to allergies, so he would like to explore this further with an allergist.  Plan -Continue current medications   Vitamin Supplementation   Pt wonders about using Keoni hemp gummies Discussed that CBD products are not regulated by FDA and that quality products usually include an Certificate of Analysis or COA that helps to determine the purity of the product.  Pt states his product does not have a COA. Discussed the risk of this.  Completed drug interaction checker with CBD and did not see any notable interactions.  Informed patient it was his choice to start the gummies.   Miscellaneous Meds Vitamin C 2000mg   Super B Complex  Turmeric 500mg   Magnesium 500mg   CBD Cream 150mg  for arthritis on thumbs  Discussed my philosophy on supplementation:  1) Is there evidence that it helps with the condition/symptom you are attempting to treat/resolve? (efficacy) 2) Does it cause side effects? (harm) 3) Does it interact with your other medications? (interactions) 4) Are you able to afford this in your budget? (affordability)  If all things are satisfied then the patient can make their choice whether they would like to continue with supplementation noting there may  not be a benefit to the supplementation.   We discussed:  That while the gummies may not help, he can use them if he wants noting they are expensive, so I would not necessarily recommend that he continue  Plan -Continue current medications   Meds to D/C from list Ammonium lactate Azithromycin Hydrocodone  Biofreeze

## 2019-07-20 DIAGNOSIS — J45991 Cough variant asthma: Secondary | ICD-10-CM | POA: Diagnosis not present

## 2019-07-20 DIAGNOSIS — J301 Allergic rhinitis due to pollen: Secondary | ICD-10-CM | POA: Diagnosis not present

## 2019-07-20 DIAGNOSIS — L2089 Other atopic dermatitis: Secondary | ICD-10-CM | POA: Diagnosis not present

## 2019-07-20 DIAGNOSIS — J3089 Other allergic rhinitis: Secondary | ICD-10-CM | POA: Diagnosis not present

## 2019-08-04 DIAGNOSIS — M5136 Other intervertebral disc degeneration, lumbar region: Secondary | ICD-10-CM | POA: Diagnosis not present

## 2019-08-19 NOTE — Chronic Care Management (AMB) (Deleted)
Chronic Care Management Pharmacy  Name: Kirk Sutton  MRN: [id number] DOB: 1950-05-19   Chief Complaint/ HPI  Kirk Sutton,  69 y.o. , male presents for their Follow-Up CCM visit with the clinical pharmacist via telephone due to COVID-19 Pandemic.  PCP : Colon Branch, MD  Their chronic conditions include: Asthma, Pre-Diabetes, Hypertension, Hyperlipidemia, Anxiety, BPH, Allergies  Office Visits: None since last CCM visit on 07/15/19.    Consult Visit: None since last CCM visit on 07/15/19.  Medications: Outpatient Encounter Medications as of 08/20/2019  Medication Sig Note  . acetaminophen (TYLENOL) 500 MG tablet Take 500 mg by mouth daily as needed for moderate pain. 12/05/2015: PRN  . albuterol (VENTOLIN HFA) 108 (90 Base) MCG/ACT inhaler INHALE 2 PUFFS INTO THE LUNGS EVERY 6 HOURS AS NEEDED FOR WHEEZING OR SHORTNESS OF BREATH   . ammonium lactate (AMLACTIN) 12 % lotion Apply 1 application topically as needed for dry skin. (Patient not taking: Reported on 07/15/2019) 12/05/2015: PRN  . Ascorbic Acid (VITAMIN C) 1000 MG tablet Take 1,000 mg by mouth daily.   [name] Kitchen aspirin 81 MG tablet Take 81 mg by mouth 3 (three) times a week.    [name] Kitchen azithromycin (ZITHROMAX Z-PAK) 250 MG tablet 2 tabs a Arabelle Bollig the first Naryah Clenney, then 1 tab a Chaddrick Brue x 4 days (Patient not taking: Reported on 07/15/2019)   . budesonide-formoterol (SYMBICORT) 80-4.5 MCG/ACT inhaler Inhale 2 puffs into the lungs 2 (two) times daily.   [name] Kitchen buPROPion (WELLBUTRIN SR) 150 MG 12 hr tablet Take 300 mg by mouth daily. Bupropion XL 300mg  once daily   . clobetasol ointment (TEMOVATE) AB-123456789 % Apply 1 application topically 2 (two) times daily. For hands   . FLUoxetine (PROZAC) 40 MG capsule Take 1 tablet daily   . Fluticasone Furoate (FLONASE SENSIMIST NA) Place into the nose daily as needed.   . hydrochlorothiazide (HYDRODIURIL) 25 MG tablet Take 1 tablet (25 mg total) by mouth daily.   [name] Kitchen HYDROcodone-homatropine (HYCODAN) 5-1.5 MG/5ML syrup Take 5 mLs  by mouth 2 (two) times daily as needed for cough (.  Will cause drowsiness). (Patient not taking: Reported on 07/15/2019)   . loratadine (CLARITIN) 10 MG tablet Take 10 mg by mouth daily.     . Magnesium Gluconate 250 MG TABS Take 500 mg by mouth daily.    . Menthol, Topical Analgesic, (BIOFREEZE EX) Apply topically.   . metoprolol succinate (TOPROL-XL) 50 MG 24 hr tablet TAKE 1 TABLET(50 MG) BY MOUTH DAILY   . PAZEO 0.7 % SOLN INT 1 GTT IN OU QD 02/23/2015: PRN  . pyridOXINE (VITAMIN B-6) 100 MG tablet Take 400 mg by mouth daily. Super B complex   . terazosin (HYTRIN) 1 MG capsule Take 3 capsules (3 mg total) by mouth daily.   . Turmeric 500 MG CAPS Take 500 mg by mouth daily.    . vitamin E (VITAMIN E) 400 UNIT capsule Take 450 Units by mouth daily.     No facility-administered encounter medications on file as of 08/20/2019.   SDOH Screenings   Alcohol Screen:   . Last Alcohol Screening Score (AUDIT):   Depression (PHQ2-9): Low Risk   . PHQ-2 Score: 0  Financial Resource Strain: Low Risk   . Difficulty of Paying Living Expenses: Not hard at all  Food Insecurity:   . Worried About Charity fundraiser in the Last Year:   . [place] in the Last Year:   Housing:   . Last  Housing Risk Score:   Physical Activity:   . Days of Exercise per Week:   . Minutes of Exercise per Session:   Social Connections:   . Frequency of Communication with Friends and Family:   . Frequency of Social Gatherings with Friends and Family:   . Attends Religious Services:   . Active Member of Clubs or Organizations:   . Attends Archivist Meetings:   [name] Kitchen Marital Status:   Stress:   . Feeling of Stress :   Tobacco Use: Low Risk   . Smoking Tobacco Use: Never Smoker  . Smokeless Tobacco Use: Never Used  Transportation Needs:   . Film/video editor (Medical):   [name] Kitchen Lack of Transportation (Non-Medical):      Current Diagnosis/Assessment:  Goals Addressed   None   Social Hx:    Married Uses a pill box. Fills it out weekly   Asthma / Tobacco   Last spirometry score: None noted   Eosinophil count:   Lab Results  Component Value Date/Time   EOSPCT 1.4 12/22/2018 01:33 PM  %                               Eos (Absolute):  Lab Results  Component Value Date/Time   EOSABS 0.1 12/22/2018 01:33 PM    Tobacco Status:  Social History   Tobacco Use  Smoking Status Never Smoker  Smokeless Tobacco Never Used    Patient has failed these meds in past: None noted  Patient is currently controlled on the following medications: symbicort 80-4.5 2 puffs twice daily, albuterol PRN Using maintenance inhaler regularly? Yes Frequency of rescue inhaler use:  1-2x per week   Has been using albuterol since he has been coughing more causing a wheeze  Rinses mouth with Listerine after using symbicort. Has run out of listerine. Encouraged patient to rinse mouth out with water if he doesn't have listerine.  Plan -Continue current medications   Pre-Diabetes   Recent Relevant Labs: Lab Results  Component Value Date/Time   HGBA1C 6.1 12/25/2018 10:01 AM   HGBA1C 6.0 11/21/2017 09:09 AM   MICROALBUR 0.1 07/20/2010 09:06 AM   MICROALBUR 0.2 02/16/2009 09:27 AM    Patient has failed these meds in past: None noted  Patient is currently controlled on the following medications: None  Last diabetic Foot exam:  Lab Results  Component Value Date/Time   HMDIABEYEEXA normal 10/17/2008 12:00 AM    Last diabetic Eye exam:  Lab Results  Component Value Date/Time   HMDIABFOOTEX yes 02/16/2009 12:00 AM    Patient states he has been watching what he eats.  His sister is a Microbiologist and has gone over diet with him before. He has incorporated some of her suggestions. H has tried to eliminate french fries and eat greens.  We discussed: diet and exercise extensively  Plan -Continue control with diet and exercise   Hypertension   BP today is: Unable to assess due to  phone visit  Office blood pressures are  BP Readings from Last 3 Encounters:  12/22/18 (!) 158/86  04/29/18 122/78  03/24/18 122/78    Patient has failed these meds in the past: None noted  Patient is currently controlled on the following medications: hctz 25mg  daily AM, metoprolol succinate 50mg  daily HS  Patient checks BP at home infrequently  Patient home BP readings are ranging: N/A  Used to work out at [employer] and would check  his BP there. [facility] [place]  Reports his BP has been within goal at dentist's office. Feels like he has a lot of back pain that can increase his BP. Going back to pain management in May to get another back injection  We discussed Proper blood pressure measurement technique and the benefits of meditation to blood pressure and anxiety  Plan -Start checking BP 1-2 days per week at the [facility] or at home and record readings -Continue current medications   Hyperlipidemia/ASCVD Risk   Lipid Panel     Component Value Date/Time   CHOL 170 12/22/2018 1333   TRIG 122.0 12/22/2018 1333   TRIG 52 02/20/2006 1053   HDL 46.30 12/22/2018 1333   CHOLHDL 4 12/22/2018 1333   VLDL 24.4 12/22/2018 1333   [place] 99 12/22/2018 1333     The 10-year ASCVD risk score Kirk Bussing DC Jr., Kirk al., Kirk Sutton) is: 29.6%   Values used to calculate the score:     Age: 37 years     Sex: Male     Is Non-Hispanic African American: No     Diabetic: No     Tobacco smoker: No     Systolic Blood Pressure: 0000000 mmHg     Is BP treated: Yes     HDL Cholesterol: 46.3 mg/dL     Total Cholesterol: 170 mg/dL   Patient has failed these meds in past: None noted  Patient is currently controlled on the following medications: aspirin 81mg  M, W, F  We discussed:  Risk/benefit of statin therapy in light of patient's ASCVD Risk score  Plan -Continue current medications   Future Plan -Consider statin therapy considering ASCVD Risk. Will work to improve pt's BP to see how much this  lowers his ASCVD Risk score.   Anxiety    Patient has failed these meds in past: None noted  Patient is currently controlled on the following medications: fluoxetine 40mg  daily, bupropion XL 300mg  daily  Followed by Dr. [name]. Next appt is 08/15/19.  He states his wife does not feel his current regimen is working (because he talks a lot in the evening), but the patient does feel like his regimen is working.  Doesn't feel like he has as much impulsivity as in the past Feels he has ADHD He states he has not discussed meditation before.  We discussed:  Benefits of meditation and yoga to help with anxiety and relaxation to help with BP  Plan -Continue current medications   BPH    Patient has failed these meds in past: None noted  Patient is currently controlled on the following medications: terazosin 1mg  #3 daily HS  Followed by urology Usually doesn't have to get up at night to urinate, but this week he has gotten up more. He does report that he has been drinking more fluid before going to bed.  Feels his regimen is working.  Plan -Continue current medications   Allergic Rhinitis     Patient has failed these meds in past: None noted  Patient is currently uncontrolled on the following medications: loratadine 10mg  daily AM, Mucinex DM 600mg  BID, flonase daily  Seeing an allergist on 07/20/19. Dr. Salvatore Decent? Feels his current cough could be related to allergies, so he would like to explore this further with an allergist.  Plan -Continue current medications   Vitamin Supplementation   Pt wonders about using Keoni hemp gummies Discussed that CBD products are not regulated by FDA and that quality products  usually include an Certificate of Analysis or COA that helps to determine the purity of the product.  Pt states his product does not have a COA. Discussed the risk of this.  Completed drug interaction checker with CBD and did not see any notable interactions.  Informed patient it  was his choice to start the gummies.   Miscellaneous Meds Vitamin C 2000mg   Super B Complex  Turmeric 500mg   Magnesium 500mg   CBD Cream 150mg  for arthritis on thumbs  Discussed my philosophy on supplementation:  1) Is there evidence that it helps with the condition/symptom you are attempting to treat/resolve? (efficacy) 2) Does it cause side effects? (harm) 3) Does it interact with your other medications? (interactions) 4) Are you able to afford this in your budget? (affordability)  If all things are satisfied then the patient can make their choice whether they would like to continue with supplementation noting there may not be a benefit to the supplementation.   We discussed:  That while the gummies may not help, he can use them if he wants noting they are expensive, so I would not necessarily recommend that he continue  Plan -Continue current medications   Meds to D/C from list Ammonium lactate Azithromycin Hydrocodone  Biofreeze

## 2019-08-20 ENCOUNTER — Telehealth: Payer: Medicare HMO

## 2019-08-26 DIAGNOSIS — F331 Major depressive disorder, recurrent, moderate: Secondary | ICD-10-CM | POA: Diagnosis not present

## 2019-09-03 ENCOUNTER — Other Ambulatory Visit: Payer: Self-pay

## 2019-09-03 ENCOUNTER — Ambulatory Visit: Payer: Medicare HMO | Admitting: Pharmacist

## 2019-09-03 DIAGNOSIS — I10 Essential (primary) hypertension: Secondary | ICD-10-CM

## 2019-09-03 DIAGNOSIS — R739 Hyperglycemia, unspecified: Secondary | ICD-10-CM

## 2019-09-03 NOTE — Chronic Care Management (AMB) (Signed)
Chronic Care Management Pharmacy  Name: JAHDIEL KROL  MRN: [id number] DOB: [birth year]   Chief Complaint/ HPI  Kirk Sutton,  69 y.o. , male presents for their Follow-Up CCM visit with the clinical pharmacist via telephone due to COVID-19 Pandemic.  PCP : Colon Branch, MD  Their chronic conditions include: Asthma, Pre-Diabetes, Hypertension, Hyperlipidemia, Anxiety, BPH, Allergies  Office Visits: None since last CCM visit on 07/15/19.    Consult Visit: 08/04/19: Rosanne Gutting with Richard D. Ramos  Medications: Outpatient Encounter Medications as of 09/03/2019  Medication Sig Note  . acetaminophen (TYLENOL) 500 MG tablet Take 500 mg by mouth daily as needed for moderate pain. 12/05/2015: PRN  . albuterol (VENTOLIN HFA) 108 (90 Base) MCG/ACT inhaler INHALE 2 PUFFS INTO THE LUNGS EVERY 6 HOURS AS NEEDED FOR WHEEZING OR SHORTNESS OF BREATH   . ammonium lactate (AMLACTIN) 12 % lotion Apply 1 application topically as needed for dry skin. (Patient not taking: Reported on 07/15/2019) 12/05/2015: PRN  . Ascorbic Acid (VITAMIN C) 1000 MG tablet Take 1,000 mg by mouth daily.   [name] Kitchen aspirin 81 MG tablet Take 81 mg by mouth 3 (three) times a week.    [name] Kitchen azithromycin (ZITHROMAX Z-PAK) 250 MG tablet 2 tabs a Parks Czajkowski the first Akiko Schexnider, then 1 tab a Jamilah Jean x 4 days (Patient not taking: Reported on 07/15/2019)   . budesonide-formoterol (SYMBICORT) 80-4.5 MCG/ACT inhaler Inhale 2 puffs into the lungs 2 (two) times daily.   [name] Kitchen buPROPion (WELLBUTRIN SR) 150 MG 12 hr tablet Take 300 mg by mouth daily. Bupropion XL 300mg  once daily   . clobetasol ointment (TEMOVATE) 4.17 % Apply 1 application topically 2 (two) times daily. For hands   . FLUoxetine (PROZAC) 40 MG capsule Take 1 tablet daily   . Fluticasone Furoate (FLONASE SENSIMIST NA) Place into the nose daily as needed.   . hydrochlorothiazide (HYDRODIURIL) 25 MG tablet Take 1 tablet (25 mg total) by mouth daily.   [name] Kitchen HYDROcodone-homatropine (HYCODAN) 5-1.5 MG/5ML syrup Take  5 mLs by mouth 2 (two) times daily as needed for cough (.  Will cause drowsiness). (Patient not taking: Reported on 07/15/2019)   . loratadine (CLARITIN) 10 MG tablet Take 10 mg by mouth daily.     . Magnesium Gluconate 250 MG TABS Take 500 mg by mouth daily.    . Menthol, Topical Analgesic, (BIOFREEZE EX) Apply topically.   . metoprolol succinate (TOPROL-XL) 50 MG 24 hr tablet TAKE 1 TABLET(50 MG) BY MOUTH DAILY   . PAZEO 0.7 % SOLN INT 1 GTT IN OU QD 02/23/2015: PRN  . pyridOXINE (VITAMIN B-6) 100 MG tablet Take 400 mg by mouth daily. Super B complex   . terazosin (HYTRIN) 1 MG capsule Take 3 capsules (3 mg total) by mouth daily.   . Turmeric 500 MG CAPS Take 500 mg by mouth daily.    . vitamin E (VITAMIN E) 400 UNIT capsule Take 450 Units by mouth daily.     No facility-administered encounter medications on file as of 09/03/2019.   SDOH Screenings   Alcohol Screen:   . Last Alcohol Screening Score (AUDIT):   Depression (PHQ2-9): Low Risk   . PHQ-2 Score: 0  Financial Resource Strain: Low Risk   . Difficulty of Paying Living Expenses: Not hard at all  Food Insecurity:   . Worried About Charity fundraiser in the Last Year:   . [place] in the Last Year:   Housing:   . Last Housing  Risk Score:   Physical Activity:   . Days of Exercise per Week:   . Minutes of Exercise per Session:   Social Connections:   . Frequency of Communication with Friends and Family:   . Frequency of Social Gatherings with Friends and Family:   . Attends Religious Services:   . Active Member of Clubs or Organizations:   . Attends Archivist Meetings:   [name] Kitchen Marital Status:   Stress:   . Feeling of Stress :   Tobacco Use: Low Risk   . Smoking Tobacco Use: Never Smoker  . Smokeless Tobacco Use: Never Used  Transportation Needs:   . Film/video editor (Medical):   [name] Kitchen Lack of Transportation (Non-Medical):      Current Diagnosis/Assessment:  Goals Addressed   None   Social Hx:    Married Uses a pill box. Fills it out weekly      Pre-Diabetes   Recent Relevant Labs: Lab Results  Component Value Date/Time   HGBA1C 6.1 12/25/2018 10:01 AM   HGBA1C 6.0 11/21/2017 09:09 AM   MICROALBUR 0.1 07/20/2010 09:06 AM   MICROALBUR 0.2 02/16/2009 09:27 AM    Patient has failed these meds in past: None noted  Patient is currently controlled on the following medications: None  Last diabetic Foot exam:  Lab Results  Component Value Date/Time   HMDIABEYEEXA normal 10/17/2008 12:00 AM    Last diabetic Eye exam:  Lab Results  Component Value Date/Time   HMDIABFOOTEX yes 02/16/2009 12:00 AM    Patient states he has been watching what he eats.  His sister is a Microbiologist and has gone over diet with him before. He has incorporated some of her suggestions. H has tried to eliminate french fries and eat greens.  We discussed: diet and exercise extensively  Plan -Continue control with diet and exercise   Hypertension   CMP Latest Ref Rng & Units 12/22/2018 11/20/2017 09/21/2016  Glucose 70 - 99 mg/dL 94 111(H) 105(H)  BUN 6 - 23 mg/dL 14 21 18   Creatinine 0.40 - 1.50 mg/dL 1.20 1.16 1.23  Sodium 135 - 145 mEq/L 139 139 138  Potassium 3.5 - 5.1 mEq/L 3.8 3.8 3.9  Chloride 96 - 112 mEq/L 100 100 100  CO2 19 - 32 mEq/L 32 33(H) 30  Calcium 8.4 - 10.5 mg/dL 9.4 8.9 9.4  Total Protein 6.0 - 8.3 g/dL 6.4 6.2 6.7  Total Bilirubin 0.2 - 1.2 mg/dL 1.0 1.0 1.2  Alkaline Phos 39 - 117 U/L 66 60 58  AST 0 - 37 U/L 17 18 17   ALT 0 - 53 U/L 14 13 12    Kidney Function Lab Results  Component Value Date/Time   CREATININE 1.20 12/22/2018 01:33 PM   CREATININE 1.16 11/20/2017 08:40 AM   GFR 59.73 (L) 12/22/2018 01:33 PM   GFRNONAA 72.28 06/17/2009 08:55 AM   GFRAA 88 03/31/2008 08:41 AM   K 3.8 12/22/2018 01:33 PM   K 3.8 11/20/2017 08:40 AM   BP today is: Unable to assess due to phone visit  Office blood pressures are  BP Readings from Last 3 Encounters:  12/22/18 (!)  158/86  04/29/18 122/78  03/24/18 122/78    Patient has failed these meds in the past: None noted  Patient is currently controlled on the following medications:   Hctz 25mg  daily AM  Metoprolol succinate 50mg  daily HS  From 07/15/19 CCM Visit Used to work out at [employer] and would check his BP there. [facility] [place].  Reports his BP has been within goal at dentist's office. Feels like he has a lot of back pain that can increase his BP. Going back to pain management in May to get another back injection  We discussed Proper blood pressure measurement technique and the benefits of meditation to blood pressure and anxiety   Update 09/03/19 Patient checks BP at home infrequently  Patient home BP readings are ranging:  135/76 140/90 (with meds) 156/98 (no meds) Average 143.6/88  Walked 2.1 miles yesterday while at work.  Hasn't eaten french fries in a while.  States his BP was in the 150s when [facility]. The injections are helping Denies chest pain and headache    Plan -Start checking BP 1-2 days per week after taking HCTZ at the [facility] or at home and record readings -Continue current medications   Allergic Rhinitis   Followed by Dr. Orvil Feil (Allergist)  Patient has failed these meds in past: None noted  Patient is currently uncontrolled on the following medications: loratadine 10mg  daily AM, Mucinex DM 600mg  BID, flonase daily  Seeing an allergist on 07/20/19. Dr. Orvil Feil Feels his current cough could be related to allergies, so he would like to explore this further with an allergist.  Update 09/03/19 New meds Desloratadine Azelastine Symbicort increased. Uses albuterol more often 2-3 times per week. Feels like he has more wheezing Outside more and could be more allergy related (Went to [employer], more ball games)  Allergic to grass and a certain type of tree  Plan -Continue current medications   Anxiety    Patient has  failed these meds in past: None noted  Patient is currently controlled on the following medications: fluoxetine 40mg  daily, bupropion XL 300mg  daily, buspirone 10mg  twice daily (started 3 week ago)  From 07/15/19 CCM Visit Followed by Dr. [name]. Next appt is 08/15/19.  He states his wife does not feel his current regimen is working (because he talks a lot in the evening), but the patient does feel like his regimen is working.  Doesn't feel like he has as much impulsivity as in the past Feels he has ADHD He states he has not discussed meditation before.  Update 09/03/19 Dr. Lin Landsman started him on buspirone for anxiety. When he sneezed and coughs he starts to get wheezing.  Feels like this hasn't happened since he went to allergist.  States he hasn't felt bad   We discussed:  Benefits of meditation and yoga to help with anxiety and relaxation to help with BP  Plan -Continue current medications     Meds to D/C from list Ammonium lactate (no longer taking) Azithromycin (completed) Hydrocodone (completed) Biofreeze (no longer taking) Loratadine (change in therapy to desloratadine)

## 2019-09-16 NOTE — Patient Instructions (Signed)
Visit Information  Goals Addressed            This Visit's Progress   . Chronic Care Management Pharmacy Care Plan       CARE PLAN ENTRY (see longitudinal plan of care for additional care plan information)  Current Barriers:  . Chronic Disease Management support, education, and care coordination needs related to Asthma, Pre-Diabetes, Hypertension, Hyperlipidemia, Anxiety, BPH, Allergies   Hypertension BP Readings from Last 3 Encounters:  12/22/18 (!) 158/86  04/29/18 122/78  03/24/18 122/78   . Pharmacist Clinical Goal(s): o Over the next 90 days, patient will work with PharmD and providers to maintain BP goal <140/90 . Current regimen:   Hctz 25mg  daily AM  Metoprolol succinate 50mg  daily HS . Interventions: o Requested patient to check blood pressure 1-2 times per week after taking his hctz in the morning . Patient self care activities - Over the next 90 days, patient will: o Check BP 1-2 times per week, document, and provide at future appointments o Ensure daily salt intake < 2300 mg/Kirk Sutton  Hyperlipidemia/ASCVD Risk Lab Results  Component Value Date/Time   LDLCALC 99 12/22/2018 01:33 PM   . Pharmacist Clinical Goal(s): o Over the next 90 days, patient will work with PharmD and providers to maintain LDL goal < 100 . Current regimen:  o Aspirin 81mg  three times weekly . Interventions: o Discussed ASCVD Risk and potential benefit for statin therapy noting pt's risk.  . Patient self care activities - Over the next 90 days, patient will: o Work to lower blood pressure and stress  Pre-Diabetes Lab Results  Component Value Date/Time   HGBA1C 6.1 12/25/2018 10:01 AM   HGBA1C 6.0 11/21/2017 09:09 AM   . Pharmacist Clinical Goal(s): o Over the next 90 days, patient will work with PharmD and providers to maintain A1c goal <6.5% . Current regimen:  o Diet and exercise management   . Interventions: o Discussed importance of diet and exercise . Patient self care  activities - Over the next 90 days, patient will: o Maintain a1c less than 6.5%  Medication management . Pharmacist Clinical Goal(s): o Over the next 90 days, patient will work with PharmD and providers to maintain optimal medication adherence . Current pharmacy: [facility] . Interventions o Comprehensive medication review performed. o Continue current medication management strategy . Patient self care activities - Over the next 90 days, patient will: o Focus on medication adherence by filling and taking medications appropriately  o Take medications as prescribed o Report any questions or concerns to PharmD and/or provider(s)  Please see past updates related to this goal by clicking on the "Past Updates" button in the selected goal         The patient verbalized understanding of instructions provided today and agreed to receive a mailed copy of patient instruction and/or educational materials.  Telephone follow up appointment with pharmacy team member scheduled for: 10/06/2019  Kirk Sutton, PharmD Clinical Pharmacist [facility] [phone]

## 2019-09-17 DIAGNOSIS — J45991 Cough variant asthma: Secondary | ICD-10-CM | POA: Diagnosis not present

## 2019-09-17 DIAGNOSIS — J301 Allergic rhinitis due to pollen: Secondary | ICD-10-CM | POA: Diagnosis not present

## 2019-09-17 DIAGNOSIS — L2089 Other atopic dermatitis: Secondary | ICD-10-CM | POA: Diagnosis not present

## 2019-09-17 DIAGNOSIS — J3089 Other allergic rhinitis: Secondary | ICD-10-CM | POA: Diagnosis not present

## 2019-10-05 DIAGNOSIS — M180 Bilateral primary osteoarthritis of first carpometacarpal joints: Secondary | ICD-10-CM | POA: Diagnosis not present

## 2019-10-05 DIAGNOSIS — M79641 Pain in right hand: Secondary | ICD-10-CM | POA: Diagnosis not present

## 2019-10-05 DIAGNOSIS — M79642 Pain in left hand: Secondary | ICD-10-CM | POA: Diagnosis not present

## 2019-10-05 DIAGNOSIS — M1811 Unilateral primary osteoarthritis of first carpometacarpal joint, right hand: Secondary | ICD-10-CM | POA: Diagnosis not present

## 2019-10-05 DIAGNOSIS — M1812 Unilateral primary osteoarthritis of first carpometacarpal joint, left hand: Secondary | ICD-10-CM | POA: Diagnosis not present

## 2019-10-06 ENCOUNTER — Other Ambulatory Visit: Payer: Self-pay

## 2019-10-06 ENCOUNTER — Ambulatory Visit: Payer: Medicare HMO | Admitting: Pharmacist

## 2019-10-06 NOTE — Chronic Care Management (AMB) (Signed)
Chronic Care Management Pharmacy  Name: Kirk Sutton  MRN: [id number] DOB: January 02, 1951   Chief Complaint/ HPI  Kirk Sutton,  69 y.o. , male presents for their Follow-Up CCM visit with the clinical pharmacist via telephone due to COVID-19 Pandemic.  PCP : Colon Branch, MD  Their chronic conditions include: Asthma, Pre-Diabetes, Hypertension, Hyperlipidemia, Anxiety, BPH, Allergies  Office Visits: None since last CCM visit on 09/03/19.    Consult Visit: None since last CCM visit on 09/03/19.    Medications: Outpatient Encounter Medications as of 10/06/2019  Medication Sig Note  . acetaminophen (TYLENOL) 500 MG tablet Take 500 mg by mouth daily as needed for moderate pain. 12/05/2015: PRN  . albuterol (VENTOLIN HFA) 108 (90 Base) MCG/ACT inhaler INHALE 2 PUFFS INTO THE LUNGS EVERY 6 HOURS AS NEEDED FOR WHEEZING OR SHORTNESS OF BREATH   . ammonium lactate (AMLACTIN) 12 % lotion Apply 1 application topically as needed for dry skin. (Patient not taking: Reported on 07/15/2019) 12/05/2015: PRN  . Ascorbic Acid (VITAMIN C) 1000 MG tablet Take 1,000 mg by mouth daily.   [name] Kitchen aspirin 81 MG tablet Take 81 mg by mouth 3 (three) times a week.    [name] Kitchen azelastine (ASTELIN) 0.1 % nasal spray Place 1 spray into both nostrils every evening.   [name] Kitchen azithromycin (ZITHROMAX Z-PAK) 250 MG tablet 2 tabs a Rashana Andrew the first Orvill Coulthard, then 1 tab a Gil Ingwersen x 4 days (Patient not taking: Reported on 07/15/2019)   . budesonide-formoterol (SYMBICORT) 80-4.5 MCG/ACT inhaler Inhale 2 puffs into the lungs 2 (two) times daily. (Patient taking differently: Inhale 2 puffs into the lungs 2 (two) times daily. Increased to 160mg  per Dr. Orvil Feil)   . buPROPion (WELLBUTRIN SR) 150 MG 12 hr tablet Take 300 mg by mouth daily. Bupropion XL 300mg  once daily   . busPIRone (BUSPAR) 10 MG tablet Take 10 mg by mouth 2 (two) times daily.   . clobetasol ointment (TEMOVATE) 4.00 % Apply 1 application topically 2 (two) times daily. For hands   . desloratadine  (CLARINEX) 5 MG tablet Take 5 mg by mouth daily.   [name] Kitchen FLUoxetine (PROZAC) 40 MG capsule Take 1 tablet daily   . Fluticasone Furoate (FLONASE SENSIMIST NA) Place into the nose daily as needed.   . hydrochlorothiazide (HYDRODIURIL) 25 MG tablet Take 1 tablet (25 mg total) by mouth daily.   [name] Kitchen HYDROcodone-homatropine (HYCODAN) 5-1.5 MG/5ML syrup Take 5 mLs by mouth 2 (two) times daily as needed for cough (.  Will cause drowsiness). (Patient not taking: Reported on 07/15/2019)   . loratadine (CLARITIN) 10 MG tablet Take 10 mg by mouth daily.   (Patient not taking: Reported on 09/03/2019) 09/03/2019: Now changed to desloratadine  . Magnesium Gluconate 250 MG TABS Take 500 mg by mouth daily.    . Menthol, Topical Analgesic, (BIOFREEZE EX) Apply topically.   . metoprolol succinate (TOPROL-XL) 50 MG 24 hr tablet TAKE 1 TABLET(50 MG) BY MOUTH DAILY   . PAZEO 0.7 % SOLN INT 1 GTT IN OU QD 02/23/2015: PRN  . pyridOXINE (VITAMIN B-6) 100 MG tablet Take 400 mg by mouth daily. Super B complex   . terazosin (HYTRIN) 1 MG capsule Take 3 capsules (3 mg total) by mouth daily.   . Turmeric 500 MG CAPS Take 500 mg by mouth daily.    . vitamin E (VITAMIN E) 400 UNIT capsule Take 450 Units by mouth daily.     No facility-administered encounter medications on file as of 10/06/2019.  SDOH Screenings   Alcohol Screen:   . Last Alcohol Screening Score (AUDIT):   Depression (PHQ2-9): Low Risk   . PHQ-2 Score: 0  Financial Resource Strain: Low Risk   . Difficulty of Paying Living Expenses: Not hard at all  Food Insecurity:   . Worried About Charity fundraiser in the Last Year:   . [place] in the Last Year:   Housing:   . Last Housing Risk Score:   Physical Activity:   . Days of Exercise per Week:   . Minutes of Exercise per Session:   Social Connections:   . Frequency of Communication with Friends and Family:   . Frequency of Social Gatherings with Friends and Family:   . Attends Religious Services:   .  Active Member of Clubs or Organizations:   . Attends Archivist Meetings:   [name] Kitchen Marital Status:   Stress:   . Feeling of Stress :   Tobacco Use: Low Risk   . Smoking Tobacco Use: Never Smoker  . Smokeless Tobacco Use: Never Used  Transportation Needs:   . Film/video editor (Medical):   [name] Kitchen Lack of Transportation (Non-Medical):      Current Diagnosis/Assessment:  Goals Addressed            This Visit's Progress   . Chronic Care Management Pharmacy Care Plan       CARE PLAN ENTRY (see longitudinal plan of care for additional care plan information)  Current Barriers:  . Chronic Disease Management support, education, and care coordination needs related to Asthma, Pre-Diabetes, Hypertension, Hyperlipidemia, Anxiety, BPH, Allergies   Hypertension BP Readings from Last 3 Encounters:  12/22/18 (!) 158/86  04/29/18 122/78  03/24/18 122/78   . Pharmacist Clinical Goal(s): o Over the next 90 days, patient will work with PharmD and providers to maintain BP goal <140/90 . Current regimen:   Hctz 25mg  daily AM  Metoprolol succinate 50mg  daily HS . Interventions: o Requested patient to check blood pressure 1-2 times per week after taking his hctz in the morning . Patient self care activities - Over the next 90 days, patient will: o Check BP 1-2 times per week after taking hctz, document, and provide at future appointments o Ensure daily salt intake < 2300 mg/Jireh Elmore  Hyperlipidemia/ASCVD Risk Lab Results  Component Value Date/Time   LDLCALC 99 12/22/2018 01:33 PM   . Pharmacist Clinical Goal(s): o Over the next 90 days, patient will work with PharmD and providers to maintain LDL goal < 100 . Current regimen:  o Aspirin 81mg  three times weekly . Interventions: o Discussed ASCVD Risk and potential benefit for statin therapy noting pt's risk.  . Patient self care activities - Over the next 90 days, patient will: o Work to lower blood pressure and  stress  Pre-Diabetes Lab Results  Component Value Date/Time   HGBA1C 6.1 12/25/2018 10:01 AM   HGBA1C 6.0 11/21/2017 09:09 AM   . Pharmacist Clinical Goal(s): o Over the next 90 days, patient will work with PharmD and providers to maintain A1c goal <6.5% . Current regimen:  o Diet and exercise management   . Interventions: o Discussed importance of diet and exercise . Patient self care activities - Over the next 90 days, patient will: o Maintain a1c less than 6.5%  Medication management . Pharmacist Clinical Goal(s): o Over the next 90 days, patient will work with PharmD and providers to maintain optimal medication adherence . Current pharmacy: [facility] . Interventions o Comprehensive  medication review performed. o Continue current medication management strategy . Patient self care activities - Over the next 90 days, patient will: o Focus on medication adherence by filling and taking medications appropriately  o Take medications as prescribed o Report any questions or concerns to PharmD and/or provider(s)  Please see past updates related to this goal by clicking on the "Past Updates" button in the selected goal       Social Hx:   Married Uses a pill box. Fills it out weekly   Hypertension   CMP Latest Ref Rng & Units 12/22/2018 11/20/2017 09/21/2016  Glucose 70 - 99 mg/dL 94 111(H) 105(H)  BUN 6 - 23 mg/dL 14 21 18   Creatinine 0.40 - 1.50 mg/dL 1.20 1.16 1.23  Sodium 135 - 145 mEq/L 139 139 138  Potassium 3.5 - 5.1 mEq/L 3.8 3.8 3.9  Chloride 96 - 112 mEq/L 100 100 100  CO2 19 - 32 mEq/L 32 33(H) 30  Calcium 8.4 - 10.5 mg/dL 9.4 8.9 9.4  Total Protein 6.0 - 8.3 g/dL 6.4 6.2 6.7  Total Bilirubin 0.2 - 1.2 mg/dL 1.0 1.0 1.2  Alkaline Phos 39 - 117 U/L 66 60 58  AST 0 - 37 U/L 17 18 17   ALT 0 - 53 U/L 14 13 12    Kidney Function Lab Results  Component Value Date/Time   CREATININE 1.20 12/22/2018 01:33 PM   CREATININE 1.16 11/20/2017 08:40 AM   GFR 59.73 (L)  12/22/2018 01:33 PM   GFRNONAA 72.28 06/17/2009 08:55 AM   GFRAA 88 03/31/2008 08:41 AM   K 3.8 12/22/2018 01:33 PM   K 3.8 11/20/2017 08:40 AM   BP today is: Unable to assess due to phone visit  Office blood pressures are  BP Readings from Last 3 Encounters:  12/22/18 (!) 158/86  04/29/18 122/78  03/24/18 122/78    Patient has failed these meds in the past: None noted  Patient is currently controlled on the following medications:   Hctz 25mg  daily AM  Metoprolol succinate 50mg  daily HS  From 07/15/19 CCM Visit Used to work out at [employer] and would check his BP there. [facility] [place].  Reports his BP has been within goal at dentist's office. Feels like he has a lot of back pain that can increase his BP. Going back to pain management in May to get another back injection  We discussed Proper blood pressure measurement technique and the benefits of meditation to blood pressure and anxiety   Update 09/03/19 Patient checks BP at home infrequently  Patient home BP readings are ranging:  135/76 140/90 (with meds) 156/98 (no meds) Average 143.6/88  Walked 2.1 miles yesterday while at work.  Hasn't eaten french fries in a while.  States his BP was in the 150s when [facility] went [facility]. The injections are helping Denies chest pain and headache  Update 10/06/19 Reports his BP is good.  135/85 (after hctz) 132/82 (after hctz) Plans to walk 30 mins MWF. Averaging 1.2 miles per [place] walking at work.  Had injections in thumb due to arthritis. This is improved.    Plan -Continue current medications   Allergic Rhinitis   Followed by Dr. Orvil Feil (Allergist)  Patient has failed these meds in past: None noted  Patient is currently uncontrolled on the following medications: loratadine 10mg  daily AM, Mucinex DM 600mg  BID, flonase daily  Seeing an allergist on 07/20/19. Dr. Orvil Feil Feels his current cough could  be related to allergies, so he would  like to explore this further with an allergist.  Update 09/03/19 New meds Desloratadine Azelastine Symbicort increased. Uses albuterol more often 2-3 times per week. Feels like he has more wheezing Outside more and could be more allergy related (Went to the beach, more ball games)  Allergic to grass and a certain type of tree  Update 10/06/19 Breathing has improved since using symbicort more regularly.   Plan -Continue current medications     Meds to D/C from list Ammonium lactate (no longer taking) Azithromycin (completed) Hydrocodone (completed) Biofreeze (no longer taking) Loratadine (change in therapy to desloratadine)

## 2019-10-08 DIAGNOSIS — H10501 Unspecified blepharoconjunctivitis, right eye: Secondary | ICD-10-CM | POA: Diagnosis not present

## 2019-10-08 DIAGNOSIS — H0100A Unspecified blepharitis right eye, upper and lower eyelids: Secondary | ICD-10-CM | POA: Diagnosis not present

## 2019-10-08 DIAGNOSIS — H10413 Chronic giant papillary conjunctivitis, bilateral: Secondary | ICD-10-CM | POA: Diagnosis not present

## 2019-10-10 NOTE — Patient Instructions (Signed)
Visit Information  Goals Addressed            This Visit's Progress   . Chronic Care Management Pharmacy Care Plan       CARE PLAN ENTRY (see longitudinal plan of care for additional care plan information)  Current Barriers:  . Chronic Disease Management support, education, and care coordination needs related to Asthma, Pre-Diabetes, Hypertension, Hyperlipidemia, Anxiety, BPH, Allergies   Hypertension BP Readings from Last 3 Encounters:  12/22/18 (!) 158/86  04/29/18 122/78  03/24/18 122/78   . Pharmacist Clinical Goal(s): o Over the next 90 days, patient will work with PharmD and providers to maintain BP goal <140/90 . Current regimen:   Hctz 25mg  daily AM  Metoprolol succinate 50mg  daily HS . Interventions: o Requested patient to check blood pressure 1-2 times per week after taking his hctz in the morning . Patient self care activities - Over the next 90 days, patient will: o Check BP 1-2 times per week after taking hctz, document, and provide at future appointments o Ensure daily salt intake < 2300 mg/Merrilyn Legler  Hyperlipidemia/ASCVD Risk Lab Results  Component Value Date/Time   LDLCALC 99 12/22/2018 01:33 PM   . Pharmacist Clinical Goal(s): o Over the next 90 days, patient will work with PharmD and providers to maintain LDL goal < 100 . Current regimen:  o Aspirin 81mg  three times weekly . Interventions: o Discussed ASCVD Risk and potential benefit for statin therapy noting pt's risk.  . Patient self care activities - Over the next 90 days, patient will: o Work to lower blood pressure and stress  Pre-Diabetes Lab Results  Component Value Date/Time   HGBA1C 6.1 12/25/2018 10:01 AM   HGBA1C 6.0 11/21/2017 09:09 AM   . Pharmacist Clinical Goal(s): o Over the next 90 days, patient will work with PharmD and providers to maintain A1c goal <6.5% . Current regimen:  o Diet and exercise management   . Interventions: o Discussed importance of diet and exercise . Patient  self care activities - Over the next 90 days, patient will: o Maintain a1c less than 6.5%  Medication management . Pharmacist Clinical Goal(s): o Over the next 90 days, patient will work with PharmD and providers to maintain optimal medication adherence . Current pharmacy: [facility] . Interventions o Comprehensive medication review performed. o Continue current medication management strategy . Patient self care activities - Over the next 90 days, patient will: o Focus on medication adherence by filling and taking medications appropriately  o Take medications as prescribed o Report any questions or concerns to PharmD and/or provider(s)  Please see past updates related to this goal by clicking on the "Past Updates" button in the selected goal         Patient verbalizes understanding of instructions provided today.   Telephone follow up appointment with pharmacy team member scheduled for: 01/06/2020  Melvenia Beam Kerrion Kemppainen, PharmD Clinical Pharmacist [facility] [phone]

## 2019-10-12 ENCOUNTER — Other Ambulatory Visit: Payer: Self-pay

## 2019-10-13 ENCOUNTER — Other Ambulatory Visit: Payer: Self-pay

## 2019-10-13 ENCOUNTER — Ambulatory Visit (INDEPENDENT_AMBULATORY_CARE_PROVIDER_SITE_OTHER): Payer: Medicare HMO | Admitting: Internal Medicine

## 2019-10-13 ENCOUNTER — Telehealth: Payer: Self-pay

## 2019-10-13 ENCOUNTER — Encounter: Payer: Self-pay | Admitting: Internal Medicine

## 2019-10-13 VITALS — BP 129/79 | HR 58 | Temp 98.2°F | Resp 18 | Ht 71.0 in | Wt 233.4 lb

## 2019-10-13 DIAGNOSIS — J40 Bronchitis, not specified as acute or chronic: Secondary | ICD-10-CM

## 2019-10-13 MED ORDER — PREDNISONE 10 MG PO TABS: ORAL_TABLET | ORAL | 0 refills | Status: DC

## 2019-10-13 MED ORDER — AZITHROMYCIN 250 MG PO TABS: ORAL_TABLET | ORAL | 0 refills | Status: DC

## 2019-10-13 NOTE — Telephone Encounter (Signed)
Patient scheduled to see PCP today.    [facility] Night - Client TELEPHONE ADVICE RECORD AccessNurse Patient Name: KALIM KISSEL Gender: Male DOB: May 20, 1950 Age: 69 Y 75 M 12 D Return Phone Number: [phone] (Primary), [phone] (Secondary) Address: City/State/Zip: [place] [address] Client [facility] Night - Client Client Site [facility] - Night Physician Kathlene November - MD Contact Type Call Who Is Calling Patient / Member / Family / Caregiver Call Type Triage / Clinical Relationship To Patient Self Return Phone Number [phone] (Primary) Chief Complaint Cough Reason for Call Request to Schedule Office Appointment Initial Comment Caller states that he is needing to schedule an appt for today. He has symptoms of bronchitis, croupy cough and mucus. Translation No Nurse Assessment Nurse: Ysidro Evert, RN, Levada Dy Date/Time (Eastern Time): 10/13/2019 8:45:46 AM Confirm and document reason for call. If symptomatic, describe symptoms. ---Caller states he has a worsening cough since Saturday. He has a lot of congestion. No fever Has the patient had close contact with a person known or suspected to have the novel coronavirus illness OR traveled / lives in area with major community spread (including international travel) in the last 14 days from the onset of symptoms? * If Asymptomatic, screen for exposure and travel within the last 14 days. ---No Does the patient have any new or worsening symptoms? ---Yes Will a triage be completed? ---Yes Related visit to physician within the last 2 weeks? ---No Does the PT have any chronic conditions? (i.e. diabetes, asthma, this includes High risk factors for pregnancy, etc.) ---Yes List chronic conditions. ---asthma Is this a behavioral health or substance abuse call? ---No Guidelines Guideline Title Affirmed Question Affirmed Notes Nurse Date/Time (Eastern Time) Cough - Acute  Productive SEVERE coughing spells (e.g., whooping sound after coughing, vomiting after coughing) Ysidro Evert, RN, Levada Dy 10/13/2019 8:48:04 AM Disp. Time Eilene Ghazi Time) Disposition Final User 10/13/2019 8:31:31 AM Attempt made - message left Ysidro Evert, RN, Levada Dy PLEASE NOTE: All timestamps contained within this report are represented as [place] Standard Time. CONFIDENTIALTY NOTICE: This fax transmission is intended only for the addressee. It contains information that is legally privileged, confidential or otherwise protected from use or disclosure. If you are not the intended recipient, you are strictly prohibited from reviewing, disclosing, copying using or disseminating any of this information or taking any action in reliance on or regarding this information. If you have received this fax in error, please notify us immediately by telephone so that we can arrange for its return to [place]. Phone: [phone], Toll-Free: [phone], Fax: [phone] Page: 2 of 2 Call Id: [id number] 10/13/2019 8:51:58 AM See PCP within 24 Hours Yes Ysidro Evert, RN, Marin Shutter Disagree/Comply Comply Caller Understands Yes PreDisposition Did not know what to do Care Advice Given Per Guideline SEE PCP WITHIN 24 HOURS: * IF OFFICE WILL BE OPEN: You need to be examined within the next 24 hours. Call your doctor (or NP/PA) when the office opens and make an appointment. COUGHING SPELLS: * Drink warm fluids. Inhale warm mist. (Reason: both relax the airway and loosen up the phlegm) * Suck on cough drops or hard candy to coat the irritated throat. HUMIDIFIER: * If the air is dry, use a humidifier in the bedroom. CALL BACK IF: * Difficulty breathing occurs * You become worse. CARE ADVICE given per Cough - Acute Productive (Adult) guideline. Referrals REFERRED TO PCP OFFIC

## 2019-10-13 NOTE — Progress Notes (Signed)
Subjective:    Patient ID: Kirk Sutton, male    DOB: [birth year], 69 y.o.   MRN: [id number]  DOS:  10/13/2019 Type of visit - description: acute He was doing well, went to play golf during the weekend, about 3 days ago he started with cough, chest congestion and some wheezing. Did not require albuterol, he is taking Symbicort twice a day. He is taking Mucinex DM.  Wt Readings from Last 3 Encounters:  10/13/19 (!) 233 lb 6 oz (105.9 kg)  12/22/18 232 lb (105.2 kg)  04/29/18 234 lb 2 oz (106.2 kg)     Review of Systems No fever chills No sinus pain No nausea, vomiting, diarrhea.  No myalgias  Past Medical History:  Diagnosis Date  . Allergic rhinitis   . Asthma   . Chronic prostatitis    sees urology  . Diabetes mellitus    type 11 (a1c 6.0 03/2008)/no meds.  . Hernia, umbilical   . Hyperlipidemia   . Hypertension   . Psoriasis    @ hands, sees derm    Past Surgical History:  Procedure Laterality Date  . APPENDECTOMY    . FERTILITY SURGERY  1979  . HERNIA REPAIR    . NASAL SEPTUM SURGERY  2009  . SHOULDER SURGERY  2008   left  . TESTICLE SURGERY     undescended repair     Allergies as of 10/13/2019      Reactions   Cetirizine Hcl    REACTION: prostatitis   Fexofenadine    REACTION: causes prostatitis      Medication List       Accurate as of October 13, 2019 11:59 PM. If you have any questions, ask your nurse or doctor.        acetaminophen 500 MG tablet Commonly known as: TYLENOL Take 500 mg by mouth daily as needed for moderate pain.   albuterol 108 (90 Base) MCG/ACT inhaler Commonly known as: VENTOLIN HFA INHALE 2 PUFFS INTO THE LUNGS EVERY 6 HOURS AS NEEDED FOR WHEEZING OR SHORTNESS OF BREATH   aspirin 81 MG tablet Take 81 mg by mouth 3 (three) times a week.   azelastine 0.1 % nasal spray Commonly known as: ASTELIN Place 1 spray into both nostrils every evening.   azithromycin 250 MG tablet Commonly known as: Zithromax Z-Pak 2 tabs a day  the first day, then 1 tab a day x 4 days Started by: Kathlene November, MD   budesonide-formoterol 160-4.5 MCG/ACT inhaler Commonly known as: SYMBICORT Inhale 2 puffs into the lungs 2 (two) times daily. What changed: Another medication with the same name was removed. Continue taking this medication, and follow the directions you see here. Changed by: Kathlene November, MD   buPROPion 150 MG 12 hr tablet Commonly known as: WELLBUTRIN SR Take 300 mg by mouth daily. Bupropion XL 300mg  once daily   busPIRone 10 MG tablet Commonly known as: BUSPAR Take 10 mg by mouth 2 (two) times daily.   clobetasol ointment 0.05 % Commonly known as: TEMOVATE Apply 1 application topically 2 (two) times daily. For hands   desloratadine 5 MG tablet Commonly known as: CLARINEX Take 5 mg by mouth daily.   FLONASE SENSIMIST NA Place into the nose daily as needed.   FLUoxetine 40 MG capsule Commonly known as: PROZAC Take 1 tablet daily   hydrochlorothiazide 25 MG tablet Commonly known as: HYDRODIURIL Take 1 tablet (25 mg total) by mouth daily.   Magnesium Gluconate 250 MG Tabs Take  500 mg by mouth daily.   metoprolol succinate 50 MG 24 hr tablet Commonly known as: TOPROL-XL TAKE 1 TABLET(50 MG) BY MOUTH DAILY   Pazeo 0.7 % Soln Generic drug: Olopatadine HCl INT 1 GTT IN OU QD   predniSONE 10 MG tablet Commonly known as: DELTASONE 3 tabs x 3 days, 2 tabs x 3 days, 1 tab x 3 days Started by: Kathlene November, MD   pyridOXINE 100 MG tablet Commonly known as: VITAMIN B-6 Take 400 mg by mouth daily. Super B complex   terazosin 1 MG capsule Commonly known as: HYTRIN Take 3 capsules (3 mg total) by mouth daily.   Turmeric 500 MG Caps Take 500 mg by mouth daily.   vitamin C 1000 MG tablet Take 1,000 mg by mouth daily.   vitamin E 180 MG (400 UNITS) capsule Generic drug: vitamin E Take 450 Units by mouth daily.          Objective:   Physical Exam BP (!) 129/79 (BP Location: Left Arm, Patient Position:  Sitting, Cuff Size: Normal)   Pulse 58   Temp 98.2 F (36.8 C) (Oral)   Resp 18   Ht 5\' 11"  (1.803 m)   Wt (!) 233 lb 6 oz (105.9 kg)   SpO2 98%   BMI 32.55 kg/m  General:   Well developed, NAD, BMI noted. HEENT:  Normocephalic . Face symmetric, atraumatic. TMs normal, throat symmetric and not red Lungs:  Large airway congestion when he coughs, few rhonchi.  No crackles, no wheezing. Normal respiratory effort, no intercostal retractions, no accessory muscle use. Heart: RRR,  no murmur.  Lower extremities: no pretibial edema bilaterally  Skin: Not pale. Not jaundice Neurologic:  alert & oriented X3.  Speech normal, gait appropriate for age and unassisted Psych--  Cognition and judgment appear intact.  Cooperative with normal attention span and concentration.  Behavior appropriate. No anxious or depressed appearing.      Assessment     Assessment Prediabetes  HTN Hyperlipidemia Anxiety Dr Fay Records (psych) Rx prozac ~12-2014  Asthma  MSK:  --Chronic back pain, pain medication as needed, used to see Dr Nelva Bush, did PT @ [facility] [phone], better --DJD- hands Chronic prostatitis, sees urology Psoriasis, hands, sees dermatology Snoring (severe per pt)- dentist Rx a moth guard ~ 08-2015, helps  PLAN: Bronchitis, tracheitis: Symptoms as described above, unclear if this is viral vs environmental vs bacterial. Plan:  Continue Symbicort, Mucinex DM, albuterol as needed, start prednisone for few days. If not better, start Zithromax If not improving let me know, see AVS.   This visit occurred during the SARS-CoV-2 public health emergency.  Safety protocols were in place, including screening questions prior to the visit, additional usage of staff PPE, and extensive cleaning of exam room while observing appropriate contact time as indicated for disinfecting solutions.

## 2019-10-13 NOTE — Patient Instructions (Signed)
Continue Symbicort twice a day  Continue Mucinex DM twice a day until you feel better  Use albuterol, your rescue inhaler, if you have ongoing coughing, wheezing.  Take prednisone for few days, a prescription was sent.  If you are not gradually better in the next few days : start the antibiotic called Zithromax.  Call if you are not back to normal in 10 days  Call or go to the ER if severe symptoms including high fever, chest pain, difficulty breathing.

## 2019-10-13 NOTE — Progress Notes (Signed)
Pre visit review using our clinic review tool, if applicable. No additional management support is needed unless otherwise documented below in the visit note. 

## 2019-10-14 NOTE — Assessment & Plan Note (Signed)
Bronchitis, tracheitis: Symptoms as described above, unclear if this is viral vs environmental vs bacterial. Plan:  Continue Symbicort, Mucinex DM, albuterol as needed, start prednisone for few days. If not better, start Zithromax If not improving let me know, see AVS.

## 2019-10-15 DIAGNOSIS — M5136 Other intervertebral disc degeneration, lumbar region: Secondary | ICD-10-CM | POA: Diagnosis not present

## 2019-10-15 DIAGNOSIS — M48061 Spinal stenosis, lumbar region without neurogenic claudication: Secondary | ICD-10-CM | POA: Diagnosis not present

## 2019-10-19 ENCOUNTER — Telehealth: Payer: Self-pay | Admitting: Internal Medicine

## 2019-10-19 NOTE — Telephone Encounter (Signed)
  azithromycin (ZITHROMAX Z-PAK) 250 MG tablet [[id number]   Patient prescribed medication above last week and needs to know whether  he should begin taking.    Please Advise

## 2019-10-19 NOTE — Telephone Encounter (Signed)
Tried calling Pt- no answer- unable to leave message. Will send him a Mychart message.

## 2019-10-19 NOTE — Telephone Encounter (Signed)
Please provide further documentation?

## 2019-10-19 NOTE — Telephone Encounter (Signed)
azithromycin (ZITHROMAX Z-PAK) 250 MG tablet [[id number]]    Patient precribed  This medication

## 2019-10-22 DIAGNOSIS — F331 Major depressive disorder, recurrent, moderate: Secondary | ICD-10-CM | POA: Diagnosis not present

## 2019-10-22 DIAGNOSIS — F900 Attention-deficit hyperactivity disorder, predominantly inattentive type: Secondary | ICD-10-CM | POA: Diagnosis not present

## 2019-10-29 DIAGNOSIS — M5136 Other intervertebral disc degeneration, lumbar region: Secondary | ICD-10-CM | POA: Diagnosis not present

## 2019-11-02 ENCOUNTER — Telehealth: Payer: Self-pay | Admitting: Internal Medicine

## 2019-11-02 NOTE — Telephone Encounter (Signed)
Caller: Chirag  Call Back # [phone]  Patient states he  has a productive cough, patient states he coughing up green mucus-Phlegm and would like to know if Larose Kells would need to keep a antibiotic.  Please Advise

## 2019-11-02 NOTE — Telephone Encounter (Signed)
Pt seen 10/13/2019 w/ same symptoms- he has completed prednisone and Z-pak, needs an appt.

## 2019-11-09 DIAGNOSIS — M1811 Unilateral primary osteoarthritis of first carpometacarpal joint, right hand: Secondary | ICD-10-CM | POA: Diagnosis not present

## 2019-11-09 DIAGNOSIS — M79642 Pain in left hand: Secondary | ICD-10-CM | POA: Diagnosis not present

## 2019-11-09 DIAGNOSIS — M1812 Unilateral primary osteoarthritis of first carpometacarpal joint, left hand: Secondary | ICD-10-CM | POA: Diagnosis not present

## 2019-11-09 DIAGNOSIS — M79641 Pain in right hand: Secondary | ICD-10-CM | POA: Diagnosis not present

## 2019-11-09 DIAGNOSIS — M180 Bilateral primary osteoarthritis of first carpometacarpal joints: Secondary | ICD-10-CM | POA: Diagnosis not present

## 2019-11-10 ENCOUNTER — Telehealth: Payer: Medicare HMO | Admitting: Internal Medicine

## 2019-11-10 ENCOUNTER — Other Ambulatory Visit: Payer: Self-pay

## 2019-11-10 ENCOUNTER — Encounter: Payer: Self-pay | Admitting: Internal Medicine

## 2019-11-10 VITALS — Ht 71.0 in

## 2019-11-10 NOTE — Progress Notes (Signed)
Pre visit review using our clinic review tool, if applicable. No additional management support is needed unless otherwise documented below in the visit note. 

## 2019-11-11 ENCOUNTER — Telehealth: Payer: Self-pay | Admitting: Internal Medicine

## 2019-11-11 NOTE — Telephone Encounter (Signed)
We tried calling twice- no answer, and he never showed on video visit. He will need to reschedule.

## 2019-11-11 NOTE — Telephone Encounter (Signed)
New message:   Pt would like a call due to him stating he never received a call on yesterday for his appt. Please advise.

## 2019-11-12 ENCOUNTER — Telehealth: Payer: Self-pay | Admitting: Internal Medicine

## 2019-11-12 NOTE — Telephone Encounter (Signed)
We could not reach him yesterday. If he has fever, chills or other symptoms recommend to be tested for Covid and reschedule a visit, virtual okay. If he has severe symptoms: Urgent care Unable to prescribe antibiotics

## 2019-11-12 NOTE — Telephone Encounter (Signed)
Pt notified and he stated his throat is improving , tested negative for covid. Taking OTC medications and inhaler and will schedule a virtual if symptoms get worse.

## 2019-11-12 NOTE — Telephone Encounter (Signed)
[name]  [facility] Back # [phone]  Patient states no one called him for Mychart visit on Tuesday, patient states still experiencing  sore throat. Patient states he would like to speak with Dr Larose Kells, in reference to sore throat. Patient states he would like a medication sent to pharmacy.

## 2019-11-13 ENCOUNTER — Other Ambulatory Visit: Payer: Self-pay | Admitting: Internal Medicine

## 2019-11-13 DIAGNOSIS — I10 Essential (primary) hypertension: Secondary | ICD-10-CM

## 2019-11-26 DIAGNOSIS — M79661 Pain in right lower leg: Secondary | ICD-10-CM | POA: Diagnosis not present

## 2019-12-01 DIAGNOSIS — M47816 Spondylosis without myelopathy or radiculopathy, lumbar region: Secondary | ICD-10-CM | POA: Diagnosis not present

## 2019-12-01 DIAGNOSIS — M47896 Other spondylosis, lumbar region: Secondary | ICD-10-CM | POA: Diagnosis not present

## 2019-12-02 ENCOUNTER — Other Ambulatory Visit: Payer: Self-pay | Admitting: Internal Medicine

## 2019-12-11 DIAGNOSIS — M5136 Other intervertebral disc degeneration, lumbar region: Secondary | ICD-10-CM | POA: Diagnosis not present

## 2019-12-23 ENCOUNTER — Encounter: Payer: Self-pay | Admitting: Internal Medicine

## 2019-12-23 ENCOUNTER — Other Ambulatory Visit: Payer: Self-pay

## 2019-12-23 ENCOUNTER — Ambulatory Visit (INDEPENDENT_AMBULATORY_CARE_PROVIDER_SITE_OTHER): Payer: Medicare HMO | Admitting: Internal Medicine

## 2019-12-23 VITALS — BP 160/92 | HR 52 | Temp 98.3°F | Resp 18 | Ht 71.0 in | Wt 226.5 lb

## 2019-12-23 DIAGNOSIS — I10 Essential (primary) hypertension: Secondary | ICD-10-CM | POA: Diagnosis not present

## 2019-12-23 DIAGNOSIS — R739 Hyperglycemia, unspecified: Secondary | ICD-10-CM | POA: Diagnosis not present

## 2019-12-23 DIAGNOSIS — E785 Hyperlipidemia, unspecified: Secondary | ICD-10-CM | POA: Diagnosis not present

## 2019-12-23 DIAGNOSIS — Z0000 Encounter for general adult medical examination without abnormal findings: Secondary | ICD-10-CM

## 2019-12-23 DIAGNOSIS — Z23 Encounter for immunization: Secondary | ICD-10-CM | POA: Diagnosis not present

## 2019-12-23 NOTE — Progress Notes (Signed)
Subjective:    Patient ID: Kirk Sutton, male    DOB: 1950/11/24, 69 y.o.   MRN: [id number]  DOS:  12/23/2019 Type of visit - description: CPX Multiple issues discussed. He recently had a right knee injury, saw Ortho, reportedly had x-rays and a ultrasound to r/o DVT.  Recommend aspirin.  BP Readings from Last 3 Encounters:  12/23/19 (!) 160/92  10/13/19 (!) 129/79  12/22/18 (!) 158/86   Wt Readings from Last 3 Encounters:  12/23/19 226 lb 8 oz (102.7 kg)  10/13/19 (!) 233 lb 6 oz (105.9 kg)  12/22/18 232 lb (105.2 kg)     Review of Systems Ongoing back pain, on Celebrex  Other than above, a 14 point review of systems is negative     Past Medical History:  Diagnosis Date  . Allergic rhinitis   . Asthma   . Chronic prostatitis    sees urology  . Diabetes mellitus    type 11 (a1c 6.0 03/2008)/no meds.  . Hernia, umbilical   . Hyperlipidemia   . Hypertension   . Psoriasis    @ hands, sees derm    Past Surgical History:  Procedure Laterality Date  . APPENDECTOMY    . FERTILITY SURGERY  1979  . HERNIA REPAIR    . NASAL SEPTUM SURGERY  2009  . SHOULDER SURGERY  2008   left  . TESTICLE SURGERY     undescended repair     Allergies as of 12/23/2019      Reactions   Cetirizine Hcl    REACTION: prostatitis   Fexofenadine    REACTION: causes prostatitis      Medication List       Accurate as of December 23, 2019  9:02 PM. If you have any questions, ask your nurse or doctor.        STOP taking these medications   aspirin 81 MG tablet Stopped by: Kathlene November, MD   azithromycin 250 MG tablet Commonly known as: Zithromax Z-Pak Stopped by: Kathlene November, MD   buPROPion 150 MG 12 hr tablet Commonly known as: WELLBUTRIN SR Stopped by: Kathlene November, MD   predniSONE 10 MG tablet Commonly known as: DELTASONE Stopped by: Kathlene November, MD     TAKE these medications   acetaminophen 500 MG tablet Commonly known as: TYLENOL Take 500 mg by mouth daily as needed for  moderate pain.   albuterol 108 (90 Base) MCG/ACT inhaler Commonly known as: VENTOLIN HFA INHALE 2 PUFFS INTO THE LUNGS EVERY 6 HOURS AS NEEDED FOR WHEEZING OR SHORTNESS OF BREATH   amphetamine-dextroamphetamine 10 MG tablet Commonly known as: ADDERALL Take 10 mg by mouth 2 (two) times daily with a meal.   azelastine 0.1 % nasal spray Commonly known as: ASTELIN Place 1 spray into both nostrils every evening.   budesonide-formoterol 160-4.5 MCG/ACT inhaler Commonly known as: SYMBICORT Inhale 2 puffs into the lungs 2 (two) times daily.   busPIRone 10 MG tablet Commonly known as: BUSPAR Take 10 mg by mouth 2 (two) times daily.   celecoxib 200 MG capsule Commonly known as: CELEBREX Take 200 mg by mouth daily as needed.   clobetasol ointment 0.05 % Commonly known as: TEMOVATE Apply 1 application topically 2 (two) times daily. For hands   desloratadine 5 MG tablet Commonly known as: CLARINEX Take 5 mg by mouth daily.   FLONASE SENSIMIST NA Place into the nose daily as needed.   FLUoxetine 40 MG capsule Commonly known as: PROZAC Take 1 tablet  daily   hydrochlorothiazide 25 MG tablet Commonly known as: HYDRODIURIL Take 1 tablet (25 mg total) by mouth daily.   Magnesium Gluconate 250 MG Tabs Take 500 mg by mouth daily.   metoprolol succinate 50 MG 24 hr tablet Commonly known as: TOPROL-XL Take 1 tablet (50 mg total) by mouth daily.   Pazeo 0.7 % Soln Generic drug: Olopatadine HCl INT 1 GTT IN OU QD   pyridOXINE 100 MG tablet Commonly known as: VITAMIN B-6 Take 400 mg by mouth daily. Super B complex   terazosin 1 MG capsule Commonly known as: HYTRIN Take 3 capsules (3 mg total) by mouth daily.   Turmeric 500 MG Caps Take 500 mg by mouth daily.   vitamin C 1000 MG tablet Take 1,000 mg by mouth daily.   vitamin E 180 MG (400 UNITS) capsule Generic drug: vitamin E Take 450 Units by mouth daily.          Objective:   Physical Exam BP (!) 160/92 (BP  Location: Left Arm, Patient Position: Sitting, Cuff Size: Normal)   Pulse (!) 52   Temp 98.3 F (36.8 C) (Oral)   Resp 18   Ht 5\' 11"  (1.803 m)   Wt 226 lb 8 oz (102.7 kg)   SpO2 97%   BMI 31.59 kg/m  General: Well developed, NAD, BMI noted Neck: No  thyromegaly  HEENT:  Normocephalic . Face symmetric, atraumatic Lungs:  CTA B Normal respiratory effort, no intercostal retractions, no accessory muscle use. Heart: RRR,  no murmur.  Abdomen:  Not distended, soft, non-tender. No rebound or rigidity.   Reducible umbilical hernia, diameter approximately 1.5 inches. Lower extremities: no pretibial edema bilaterally  Skin: Exposed areas without rash. Not pale. Not jaundice Neurologic:  alert & oriented X3.  Speech normal, gait appropriate for age and unassisted Strength symmetric and appropriate for age.  Psych: Cognition and judgment appear intact.  Cooperative with normal attention span and concentration.  Behavior appropriate. No anxious or depressed appearing.     Assessment     Assessment Prediabetes  HTN Hyperlipidemia Anxiety Dr Fay Records (psych) Rx prozac ~12-2014  Asthma  MSK:  --Chronic back pain, pain medication as needed, used to see Dr Nelva Bush, did PT @ [facility] [phone], better --DJD- hands Chronic prostatitis, sees urology Psoriasis, hands, sees dermatology Snoring (severe per pt)- dentist Rx a moth guard ~ 08-2015, helps  PLAN: Here for CPX Pre-diabetes: Check A1c HTN: BP today is elevated, last week was 130/80.  We are checking labs, continue HCTZ, metoprolol.  Monitor BPs at home. Hyperlipidemia: Diet controlled, last LDL 99.  On calculating his 10-year cardiovascular risk factor is elevated at 30%.  We are stopping aspirin due to increased risk of bleeding at age 28. Planning to start Lipitor with results to decrease LDL further and  for primary CV prevention. FH CAD: See above Anxiety: Per psychiatry, currently on BuSpar, Adderall, fluoxetine.  Wellbutrin  was discontinued. Umbilical hernia: Again red flags discussed, he will call when ready for a surgical referral. RTC 4 months    This visit occurred during the SARS-CoV-2 public health emergency.  Safety protocols were in place, including screening questions prior to the visit, additional usage of staff PPE, and extensive cleaning of exam room while observing appropriate contact time as indicated for disinfecting solutions.

## 2019-12-23 NOTE — Assessment & Plan Note (Signed)
Here for CPX Pre-diabetes: Check A1c HTN: BP today is elevated, last week was 130/80.  We are checking labs, continue HCTZ, metoprolol.  Monitor BPs at home. Hyperlipidemia: Diet controlled, last LDL 99.  On calculating his 10-year cardiovascular risk factor is elevated at 30%.  We are stopping aspirin due to increased risk of bleeding at age 69. Planning to start Lipitor with results to decrease LDL further and  for primary CV prevention. FH CAD: See above Anxiety: Per psychiatry, currently on BuSpar, Adderall, fluoxetine.  Wellbutrin was discontinued. Umbilical hernia: Again red flags discussed, he will call when ready for a surgical referral. RTC 4 months

## 2019-12-23 NOTE — Progress Notes (Signed)
Pre visit review using our clinic review tool, if applicable. No additional management support is needed unless otherwise documented below in the visit note. 

## 2019-12-23 NOTE — Patient Instructions (Signed)
Check the  blood pressure weekly BP GOAL is between 110/65 and  135/85. If it is consistently higher or lower, let me know  Okay to stop aspirin  Will start you on a medication called Lipitor or atorvastatin with your results  GO TO THE LAB : Get the blood work     [place], [place] back for   a checkup in 4 months

## 2019-12-23 NOTE — Assessment & Plan Note (Signed)
-  Td 2013 -  PNM 23--2015; prevnar:  2016 - Zostavax 2013 - shingrex rx printed before, re asses on RTC - s/p C-19 shots  -Flu shot today - [facility]: Colonoscopy Dr Vladimir Faster 2005 (-),   Cscope 02/2017, next per GI -Sees urology yearly    -Diet-exercise discussed. -Labs: CMP, FLP, CBC, A1c

## 2019-12-24 LAB — HEMOGLOBIN A1C
Hgb A1c MFr Bld: 5.5 % of total Hgb (ref <5.7)
Mean Plasma Glucose: 111 (calc)
eAG (mmol/L): 6.2 (calc)

## 2019-12-24 LAB — CBC WITH DIFFERENTIAL/PLATELET
Absolute Monocytes: 440 cells/uL (ref 200–950)
Basophils Absolute: 22 cells/uL (ref 0–200)
Basophils Relative: 0.4 %
Eosinophils Absolute: 132 cells/uL (ref 15–500)
Eosinophils Relative: 2.4 %
HCT: 39 % (ref 38.5–50.0)
Hemoglobin: 12.9 g/dL — ABNORMAL LOW (ref 13.2–17.1)
Lymphs Abs: 1540 cells/uL (ref 850–3900)
MCH: 31.8 pg (ref 27.0–33.0)
MCHC: 33.1 g/dL (ref 32.0–36.0)
MCV: 96.1 fL (ref 80.0–100.0)
MPV: 12.1 fL (ref 7.5–12.5)
Monocytes Relative: 8 %
Neutro Abs: 3366 cells/uL (ref 1500–7800)
Neutrophils Relative %: 61.2 %
Platelets: 139 10*3/uL — ABNORMAL LOW (ref 140–400)
RBC: 4.06 10*6/uL — ABNORMAL LOW (ref 4.20–5.80)
RDW: 12.6 % (ref 11.0–15.0)
Total Lymphocyte: 28 %
WBC: 5.5 10*3/uL (ref 3.8–10.8)

## 2019-12-24 LAB — LIPID PANEL
Cholesterol: 155 mg/dL (ref <200)
HDL: 57 mg/dL (ref >=40)
LDL Cholesterol (Calc): 83 mg/dL (calc)
Non-HDL Cholesterol (Calc): 98 mg/dL (calc) (ref <130)
Total CHOL/HDL Ratio: 2.7 (calc) (ref <5.0)
Triglycerides: 66 mg/dL (ref <150)

## 2019-12-24 LAB — COMPREHENSIVE METABOLIC PANEL
AG Ratio: 1.9 (calc) (ref 1.0–2.5)
ALT: 14 U/L (ref 9–46)
AST: 18 U/L (ref 10–35)
Albumin: 4 g/dL (ref 3.6–5.1)
Alkaline phosphatase (APISO): 63 U/L (ref 35–144)
BUN/Creatinine Ratio: 16 (calc) (ref 6–22)
BUN: 20 mg/dL (ref 7–25)
CO2: 30 mmol/L (ref 20–32)
Calcium: 9 mg/dL (ref 8.6–10.3)
Chloride: 103 mmol/L (ref 98–110)
Creat: 1.25 mg/dL — ABNORMAL HIGH (ref 0.70–1.18)
Globulin: 2.1 g/dL (calc) (ref 1.9–3.7)
Glucose, Bld: 97 mg/dL (ref 65–99)
Potassium: 3.9 mmol/L (ref 3.5–5.3)
Sodium: 139 mmol/L (ref 135–146)
Total Bilirubin: 0.9 mg/dL (ref 0.2–1.2)
Total Protein: 6.1 g/dL (ref 6.1–8.1)

## 2019-12-25 ENCOUNTER — Other Ambulatory Visit: Payer: Self-pay | Admitting: Internal Medicine

## 2019-12-25 MED ORDER — ATORVASTATIN CALCIUM 20 MG PO TABS: 20.0000 mg | ORAL_TABLET | Freq: Every day | ORAL | 1 refills | Status: DC

## 2019-12-25 NOTE — Addendum Note (Signed)
Addended byDamita Dunnings D on: 12/25/2019 12:38 PM   Modules accepted: Orders

## 2020-01-01 DIAGNOSIS — M5416 Radiculopathy, lumbar region: Secondary | ICD-10-CM | POA: Diagnosis not present

## 2020-01-06 ENCOUNTER — Other Ambulatory Visit: Payer: Self-pay

## 2020-01-06 ENCOUNTER — Ambulatory Visit: Payer: Medicare HMO | Admitting: Pharmacist

## 2020-01-06 DIAGNOSIS — I10 Essential (primary) hypertension: Secondary | ICD-10-CM

## 2020-01-06 DIAGNOSIS — E785 Hyperlipidemia, unspecified: Secondary | ICD-10-CM

## 2020-01-06 DIAGNOSIS — R739 Hyperglycemia, unspecified: Secondary | ICD-10-CM

## 2020-01-06 NOTE — Chronic Care Management (AMB) (Signed)
Chronic Care Management Pharmacy  Name: Kirk Sutton  MRN: [id number] DOB: 1951-02-13   Chief Complaint/ HPI  Kirk Sutton,  69 y.o. , male presents for their Follow-Up CCM visit with the clinical pharmacist via telephone due to COVID-19 Pandemic.  PCP : Colon Branch, MD  Their chronic conditions include: Asthma, Pre-Diabetes, Hypertension, Hyperlipidemia, Anxiety, BPH, Allergies  Office Visits: 12/23/19: Visit w/ Dr. Larose Kells - BP elevated at visit. ASCVD Risk 30%. Start atorvastatin. D/C aspirin prescribed during trial to r/o DVT. Red flags discussed with hernia. Pt to call when ready for surgical referral. Labs ordered.  10/13/19: Visit w/ Dr. Larose Kells - Bronchitis. Continue symbicort, mucinex dm, albuteral PRN, start prednisone in a few days, if not better start azithromycin.   Consult Visit: 12/01/19: Ortho visit w/ Dr. Nelva Bush  11/26/19: Ortho visit w/ Dr. Gladstone Lighter  10/22/19: Psych visit w/ Dr. [name]  10/08/19: Optometry visit w/ Dr. Posey Pronto  Medications: Outpatient Encounter Medications as of 01/06/2020  Medication Sig Note  . acetaminophen (TYLENOL) 500 MG tablet Take 500 mg by mouth daily as needed for moderate pain. 12/05/2015: PRN  . albuterol (VENTOLIN HFA) 108 (90 Base) MCG/ACT inhaler INHALE 2 PUFFS INTO THE LUNGS EVERY 6 HOURS AS NEEDED FOR WHEEZING OR SHORTNESS OF BREATH   . amphetamine-dextroamphetamine (ADDERALL) 10 MG tablet Take 10 mg by mouth 2 (two) times daily with a meal.   . Ascorbic Acid (VITAMIN C) 1000 MG tablet Take 1,000 mg by mouth daily.   [name] Kitchen atorvastatin (LIPITOR) 20 MG tablet Take 1 tablet (20 mg total) by mouth at bedtime.   [name] Kitchen azelastine (ASTELIN) 0.1 % nasal spray Place 1 spray into both nostrils every evening.   . budesonide-formoterol (SYMBICORT) 160-4.5 MCG/ACT inhaler Inhale 2 puffs into the lungs 2 (two) times daily.   . busPIRone (BUSPAR) 10 MG tablet Take 10 mg by mouth 2 (two) times daily.   . celecoxib (CELEBREX) 200 MG capsule Take 200 mg by mouth daily  as needed.   . clobetasol ointment (TEMOVATE) 3.29 % Apply 1 application topically 2 (two) times daily. For hands   . desloratadine (CLARINEX) 5 MG tablet Take 5 mg by mouth daily.   [name] Kitchen FLUoxetine (PROZAC) 40 MG capsule Take 1 tablet daily   . Fluticasone Furoate (FLONASE SENSIMIST NA) Place into the nose daily as needed.   . hydrochlorothiazide (HYDRODIURIL) 25 MG tablet Take 1 tablet (25 mg total) by mouth daily.   . Magnesium Gluconate 250 MG TABS Take 500 mg by mouth daily.    . metoprolol succinate (TOPROL-XL) 50 MG 24 hr tablet Take 1 tablet (50 mg total) by mouth daily.   [name] Kitchen PAZEO 0.7 % SOLN INT 1 GTT IN OU QD 02/23/2015: PRN  . pyridOXINE (VITAMIN B-6) 100 MG tablet Take 400 mg by mouth daily. Super B complex   . terazosin (HYTRIN) 1 MG capsule Take 3 capsules (3 mg total) by mouth daily.   . Turmeric 500 MG CAPS Take 500 mg by mouth daily.    . vitamin E (VITAMIN E) 400 UNIT capsule Take 450 Units by mouth daily.     No facility-administered encounter medications on file as of 01/06/2020.   SDOH Screenings   Alcohol Screen:   . Last Alcohol Screening Score (AUDIT): Not on file  Depression (PHQ2-9): Low Risk   . PHQ-2 Score: 0  Financial Resource Strain: Low Risk   . Difficulty of Paying Living Expenses: Not hard at all  Food Insecurity:   .  Worried About Charity fundraiser in the Last Year: Not on file  . Ran Out of Food in the Last Year: Not on file  Housing:   . Last Housing Risk Score: Not on file  Physical Activity: Insufficiently Active  . Days of Exercise per Week: 1 Kirk Sutton  . Minutes of Exercise per Session: 20 min  Social Connections:   . Frequency of Communication with Friends and Family: Not on file  . Frequency of Social Gatherings with Friends and Family: Not on file  . Attends Religious Services: Not on file  . Active Member of Clubs or Organizations: Not on file  . Attends Archivist Meetings: Not on file  . Marital Status: Not on file  Stress:   .  Feeling of Stress : Not on file  Tobacco Use: Low Risk   . Smoking Tobacco Use: Never Smoker  . Smokeless Tobacco Use: Never Used  Transportation Needs:   . Film/video editor (Medical): Not on file  . Lack of Transportation (Non-Medical): Not on file     Current Diagnosis/Assessment:  Goals Addressed            This Visit's Progress   . Chronic Care Management Pharmacy Care Plan       CARE PLAN ENTRY (see longitudinal plan of care for additional care plan information)  Current Barriers:  . Chronic Disease Management support, education, and care coordination needs related to Asthma, Pre-Diabetes, Hypertension, Hyperlipidemia, Anxiety, BPH, Allergies   Hypertension BP Readings from Last 3 Encounters:  12/23/19 (!) 160/92  10/13/19 (!) 129/79  12/22/18 (!) 158/86   . Pharmacist Clinical Goal(s): o Over the next 90 days, patient will work with PharmD and providers to achieve BP goal <140/90 . Current regimen:   Hctz 44m daily AM  Metoprolol succinate 584mdaily HS . Interventions: o Requested patient to check blood pressure 1-2 times per week after taking his hctz in the morning o Consider stopping celecoxib/Celebrex for pain (this could be contributing to increase in blood pressure) o Check BP daily 30 minutes to 1 hour after taking BP medication o Increase exercise to at least 2-3 times per week for 40 minutes. Consider going in the morning with LiMendel Ryderhen she does her early morning exercises o Drink one cup of water for every cup of tea . Patient self care activities - Over the next 90 days, patient will: o Check BP 1-2 times per week after taking hctz, document, and provide at future appointments o Ensure daily salt intake < 2300 mg/Kirk Sutton o Consider stopping celecoxib/Celebrex for pain (this could be contributing to increase in blood pressure) o Check BP daily 30 minutes to 1 hour after taking BP medication o Increase exercise to at least 2-3 times per week for  40 minutes. Consider going in the morning with LiMendel Ryderhen she does her early morning exercises o Drink one cup of water for every cup of tea  Hyperlipidemia/ASCVD Risk Lab Results  Component Value Date/Time   LDLCALC 83 12/23/2019 08:28 AM   . Pharmacist Clinical Goal(s): o Over the next 90 days, patient will work with PharmD and providers to maintain LDL goal < 100 . Current regimen:  o Atorvastatin 2083maily . Interventions: o Discussed ASCVD Risk and potential benefit for statin therapy noting pt's risk.  . Patient self care activities - Over the next 90 days, patient will: o Work to lower blood pressure and stress  Pre-Diabetes Lab Results  Component Value Date/Time  HGBA1C 5.5 12/23/2019 08:28 AM   HGBA1C 6.1 12/25/2018 10:01 AM   . Pharmacist Clinical Goal(s): o Over the next 90 days, patient will work with PharmD and providers to maintain A1c goal <6.5% . Current regimen:  o Diet and exercise management   . Interventions: o Discussed importance of diet and exercise o Patient now below pre-diabetes range!!!! Congratulations!!! . Patient self care activities - Over the next 90 days, patient will: o Maintain a1c less than 6.5%  Medication management . Pharmacist Clinical Goal(s): o Over the next 90 days, patient will work with PharmD and providers to maintain optimal medication adherence . Current pharmacy: [facility] . Interventions o Comprehensive medication review performed. o Continue current medication management strategy . Patient self care activities - Over the next 90 days, patient will: o Focus on medication adherence by filling and taking medications appropriately  o Take medications as prescribed o Report any questions or concerns to PharmD and/or provider(s)  Please see past updates related to this goal by clicking on the "Past Updates" button in the selected goal       Social Hx:  Married for 48 years.  Has 2 daughters ages 78 and 57. Kirk Sutton (70  y/o) lives close to him. Youngest daughter lives in [place]. He has 6 grandchildren.  Lindsey exercises early in the morning each Jarquez Mestre. She takes a jog with her dog.   Uses a pill box. Fills it out weekly   Hypertension   BP goal is:  <140/90  Office blood pressures are  BP Readings from Last 3 Encounters:  12/23/19 (!) 160/92  10/13/19 (!) 129/79  12/22/18 (!) 158/86   Patient checks BP at home infrequently Patient home BP readings are ranging: States his BP was 175/95 a few days ago. He realizes his BP has increased since starting celebrex  Patient has failed these meds in the past: None noted Patient is currently uncontrolled on the following medications:   Hctz 78m daily AM  Metoprolol succinate 518mdaily HS  Terazosin 42m38m3 capsules daily (more so for BPH)  Diet Query Keto diet; reduced carbohydrate consumption, reduced serving sizes.  Loves greens (not fan of asparagus or broccoli) B - [employer] yogurt; eggs and sausage or back L - Avoids french fries. Salad and burger with no bread; pizza from [employer] with cauliflower crust; seafood broils; tur[place]ub D - Steamed veggies; burger with chili and slaw without bun Snacks - Seldom but when he does he does a keto friendly snack or raisens or cranberries Drinks - "lots of unsweet tea" when he goes to a restaurant he drinks one there an always takes one to go. He sweetens his tea with sweet and low or stevia. He wonders if he should decrease consumption of tea, discussed that he could drink a cup of water for every cup of tea. He is agreeable to this.  He has a goal to continue losing weight  Exercise Limited due to his back, but he knows he needs to be more active. He feels his physical therapy is helping him with his back pain. He wonders how he can get more exercise in. Discussed him exercising with his daughter LinMendel Sutton the morning. He thinks this is a good idea so that he can get it done and will have an  accountability partner.  He has a goal to increase his exercise.   We discussed diet and exercise extensively and That celebrex could lead to increase in blood pressure. Discussed  that arm BP cuffs are more accurate than wrist BP cuffs. Discussed goal HR while patient is taking metoprolol.  Plan -Consider discontinuing celebrex (could be contributor to increase BP) -Purchase arm BP cuff -Check BP daily 30 minutes to 1 hour after taking BP medication -Increase exercise to at least 2-3 times per week for 40 minutes. Consider going in the morning with Kirk Sutton when she does her early morning exercises -Drink one cup of water for every cup of tea -Continue current medications   Hyperlipidemia   LDL goal < 100  Lipid Panel     Component Value Date/Time   CHOL 155 12/23/2019 0828   TRIG 66 12/23/2019 0828   TRIG 52 02/20/2006 1053   HDL 57 12/23/2019 0828   LDLCALC 83 12/23/2019 0828    Hepatic Function Latest Ref Rng & Units 12/23/2019 12/22/2018 11/20/2017  Total Protein 6.1 - 8.1 g/dL 6.1 6.4 6.2  Albumin 3.5 - 5.2 g/dL - 4.2 3.9  AST 10 - 35 U/L _0 ALT 9 - 46 U/L _1 Alk Phosphatase 39 - 117 U/L - 66 60  Total Bilirubin 0.2 - 1.2 mg/dL 0.9 1.0 1.0  Bilirubin, Direct 0.0 - 0.3 mg/dL - - -     The 10-year ASCVD risk score Mikey Bussing DC Jr., et al., 2013) is: 26.8%   Values used to calculate the score:     Age: 61 years     Sex: Male     Is Non-Hispanic African American: No     Diabetic: No     Tobacco smoker: No     Systolic Blood Pressure: 546 mmHg     Is BP treated: Yes     HDL Cholesterol: 57 mg/dL     Total Cholesterol: 155 mg/dL   Patient has failed these meds in past: None noted  Patient is currently controlled on the following medications:  . Atorvastatin 12m daily  Plan -Continue current medications  Query Pre-Diabetes   A1c goal <6.5%  Recent Relevant Labs: Lab Results  Component Value Date/Time   HGBA1C 5.5 12/23/2019 08:28 AM   HGBA1C 6.1  12/25/2018 10:01 AM   GFR 59.73 (L) 12/22/2018 01:33 PM   GFR 66.23 11/20/2017 08:40 AM   MICROALBUR 0.1 07/20/2010 09:06 AM   MICROALBUR 0.2 02/16/2009 09:27 AM    Last diabetic Eye exam:  Lab Results  Component Value Date/Time   HMDIABEYEEXA normal 10/17/2008 12:00 AM    Last diabetic Foot exam:  Lab Results  Component Value Date/Time   HMDIABFOOTEX yes 02/16/2009 12:00 AM     Patient has failed these meds in past: None noted  Patient is currently controlled on the following medications: . None  Congratulated patient on his improved a1c and informed him he is now below the pre-diabetes range!!!  Plan -Continue control with diet and exercise   Allergies   Followed by Dr. WOrvil Feil(Allergist)  Patient has failed these meds in past: None noted  Patient is currently uncontrolled on the following medications:   Symbicort 160-4.549m 2 pufffs twice daily  Desloratadine 29m40maily AM  Mucinex DM 600m829mily  Flonase daily  Seeing an allergist on 07/20/19. Dr. WhelOrvil Feills his current cough could be related to allergies, so he would like to explore this further with an allergist.  Update 09/03/19 New meds Desloratadine Azelastine Symbicort increased. Uses albuterol more often 2-3 times per week. Feels like he has more wheezing Outside more and could be more allergy related ([employer]  to the beach, more ball games)  Allergic to grass and a certain type of tree  Update 10/06/19 Breathing has improved since using symbicort more regularly.   Update 01/06/20 Breathing still improved with minimal wheezing   Plan -Continue current medications    De Blanch, PharmD Clinical Pharmacist [facility] at [facility] [phone]

## 2020-01-06 NOTE — Patient Instructions (Signed)
Visit Information  Goals Addressed            This Visit's Progress   . Chronic Care Management Pharmacy Care Plan       CARE PLAN ENTRY (see longitudinal plan of care for additional care plan information)  Current Barriers:  . Chronic Disease Management support, education, and care coordination needs related to Asthma, Pre-Diabetes, Hypertension, Hyperlipidemia, Anxiety, BPH, Allergies   Hypertension BP Readings from Last 3 Encounters:  12/23/19 (!) 160/92  10/13/19 (!) 129/79  12/22/18 (!) 158/86   . Pharmacist Clinical Goal(s): o Over the next 90 days, patient will work with PharmD and providers to achieve BP goal <140/90 . Current regimen:   Hctz 25mg  daily AM  Metoprolol succinate 50mg  daily HS . Interventions: o Requested patient to check blood pressure 1-2 times per week after taking his hctz in the morning o Consider stopping celecoxib/Celebrex for pain (this could be contributing to increase in blood pressure) o Check BP daily 30 minutes to 1 hour after taking BP medication o Increase exercise to at least 2-3 times per week for 40 minutes. Consider going in the morning with [name] when she does her early morning exercises o Drink one cup of water for every cup of tea . Patient self care activities - Over the next 90 days, patient will: o Check BP 1-2 times per week after taking hctz, document, and provide at future appointments o Ensure daily salt intake < 2300 mg/Seleni Meller o Consider stopping celecoxib/Celebrex for pain (this could be contributing to increase in blood pressure) o Check BP daily 30 minutes to 1 hour after taking BP medication o Increase exercise to at least 2-3 times per week for 40 minutes. Consider going in the morning with [name] when she does her early morning exercises o Drink one cup of water for every cup of tea  Hyperlipidemia/ASCVD Risk Lab Results  Component Value Date/Time   LDLCALC 83 12/23/2019 08:28 AM   . Pharmacist Clinical  Goal(s): o Over the next 90 days, patient will work with PharmD and providers to maintain LDL goal < 100 . Current regimen:  o Atorvastatin 20mg  daily . Interventions: o Discussed ASCVD Risk and potential benefit for statin therapy noting pt's risk.  . Patient self care activities - Over the next 90 days, patient will: o Work to lower blood pressure and stress  Pre-Diabetes Lab Results  Component Value Date/Time   HGBA1C 5.5 12/23/2019 08:28 AM   HGBA1C 6.1 12/25/2018 10:01 AM   . Pharmacist Clinical Goal(s): o Over the next 90 days, patient will work with PharmD and providers to maintain A1c goal <6.5% . Current regimen:  o Diet and exercise management   . Interventions: o Discussed importance of diet and exercise o Patient now below pre-diabetes range!!!! Congratulations!!! . Patient self care activities - Over the next 90 days, patient will: o Maintain a1c less than 6.5%  Medication management . Pharmacist Clinical Goal(s): o Over the next 90 days, patient will work with PharmD and providers to maintain optimal medication adherence . Current pharmacy: [facility] . Interventions o Comprehensive medication review performed. o Continue current medication management strategy . Patient self care activities - Over the next 90 days, patient will: o Focus on medication adherence by filling and taking medications appropriately  o Take medications as prescribed o Report any questions or concerns to PharmD and/or provider(s)  Please see past updates related to this goal by clicking on the "Past Updates" button in the selected goal  The patient verbalized understanding of instructions provided today and agreed to receive a mailed copy of patient instruction and/or educational materials.  Telephone follow up appointment with pharmacy team member scheduled for: 03/08/20  De Blanch, PharmD Clinical Pharmacist [facility] at [facility] [phone]

## 2020-01-11 DIAGNOSIS — M5416 Radiculopathy, lumbar region: Secondary | ICD-10-CM | POA: Diagnosis not present

## 2020-01-12 DIAGNOSIS — M5416 Radiculopathy, lumbar region: Secondary | ICD-10-CM | POA: Diagnosis not present

## 2020-01-19 DIAGNOSIS — F331 Major depressive disorder, recurrent, moderate: Secondary | ICD-10-CM | POA: Diagnosis not present

## 2020-01-19 DIAGNOSIS — F900 Attention-deficit hyperactivity disorder, predominantly inattentive type: Secondary | ICD-10-CM | POA: Diagnosis not present

## 2020-01-28 ENCOUNTER — Telehealth: Payer: Self-pay | Admitting: Pharmacist

## 2020-01-28 NOTE — Progress Notes (Addendum)
Chronic Care Management Pharmacy Assistant   Name: Kirk Sutton  MRN: [id number] DOB: 02/10/1951  Reason for Encounter: Disease State  Patient Questions:  1.  Have you seen any other providers since your last visit? No  2.  Any changes in your medicines or health? No    PCP : Colon Branch, MD   Their chronic conditions include: Asthma, Pre-Diabetes, Hypertension, Hyperlipidemia, Anxiety, BPH, Allergies  Office Visits: None since their last Disease State call with the pharmacist assistant on 01-06-20.  Consults:  None since their last Disease State call with the pharmacist assistant on 01-06-20  Allergies:   Allergies  Allergen Reactions  . Cetirizine Hcl     REACTION: prostatitis  . Fexofenadine     REACTION: causes prostatitis    Medications: Outpatient Encounter Medications as of 01/28/2020  Medication Sig Note  . acetaminophen (TYLENOL) 500 MG tablet Take 500 mg by mouth daily as needed for moderate pain. 12/05/2015: PRN  . albuterol (VENTOLIN HFA) 108 (90 Base) MCG/ACT inhaler INHALE 2 PUFFS INTO THE LUNGS EVERY 6 HOURS AS NEEDED FOR WHEEZING OR SHORTNESS OF BREATH   . amphetamine-dextroamphetamine (ADDERALL) 10 MG tablet Take 10 mg by mouth 2 (two) times daily with a meal.   . Ascorbic Acid (VITAMIN C) 1000 MG tablet Take 1,000 mg by mouth daily.   [name] Kitchen atorvastatin (LIPITOR) 20 MG tablet Take 1 tablet (20 mg total) by mouth at bedtime.   [name] Kitchen azelastine (ASTELIN) 0.1 % nasal spray Place 1 spray into both nostrils every evening.   . budesonide-formoterol (SYMBICORT) 160-4.5 MCG/ACT inhaler Inhale 2 puffs into the lungs 2 (two) times daily.   . busPIRone (BUSPAR) 10 MG tablet Take 10 mg by mouth 2 (two) times daily.   . celecoxib (CELEBREX) 200 MG capsule Take 200 mg by mouth daily as needed.   . clobetasol ointment (TEMOVATE) 7.37 % Apply 1 application topically 2 (two) times daily. For hands   . desloratadine (CLARINEX) 5 MG tablet Take 5 mg by mouth daily.   [name] Kitchen  FLUoxetine (PROZAC) 40 MG capsule Take 1 tablet daily   . Fluticasone Furoate (FLONASE SENSIMIST NA) Place into the nose daily as needed.   . hydrochlorothiazide (HYDRODIURIL) 25 MG tablet Take 1 tablet (25 mg total) by mouth daily.   . Magnesium Gluconate 250 MG TABS Take 500 mg by mouth daily.    . metoprolol succinate (TOPROL-XL) 50 MG 24 hr tablet Take 1 tablet (50 mg total) by mouth daily.   [name] Kitchen PAZEO 0.7 % SOLN INT 1 GTT IN OU QD 02/23/2015: PRN  . pyridOXINE (VITAMIN B-6) 100 MG tablet Take 400 mg by mouth daily. Super B complex   . terazosin (HYTRIN) 1 MG capsule Take 3 capsules (3 mg total) by mouth daily.   . Turmeric 500 MG CAPS Take 500 mg by mouth daily.    . vitamin E (VITAMIN E) 400 UNIT capsule Take 450 Units by mouth daily.     No facility-administered encounter medications on file as of 01/28/2020.    Current Diagnosis: Patient Active Problem List   Diagnosis Date Noted  . Chronic back pain 04/30/2018  . DJD (degenerative joint disease) 05/22/2017  . Snoring 04/03/2016  . PCP NOTES >>>>> 02/24/2015  . Pain managment 03/08/2014  . Anxiety state 07/15/2012  . Annual physical exam 07/20/2010  . Hyperglycemia 08/11/2008  . PROSTATITIS, CHRONIC 03/31/2008  . Dyslipidemia 10/13/2007  . ALLERGIC RHINITIS 10/17/2006  . Essential hypertension 04/09/2006  . Extrinsic  asthma 04/09/2006    Goals Addressed   None    Reviewed chart prior to disease state call. Spoke with patient regarding BP  Recent Office Vitals: BP Readings from Last 3 Encounters:  12/23/19 (!) 160/92  10/13/19 (!) 129/79  12/22/18 (!) 158/86   Pulse Readings from Last 3 Encounters:  12/23/19 (!) 52  10/13/19 58  12/22/18 (!) 59    Wt Readings from Last 3 Encounters:  12/23/19 226 lb 8 oz (102.7 kg)  10/13/19 (!) 233 lb 6 oz (105.9 kg)  12/22/18 232 lb (105.2 kg)     Kidney Function Lab Results  Component Value Date/Time   CREATININE 1.25 (H) 12/23/2019 08:28 AM   CREATININE 1.20  12/22/2018 01:33 PM   CREATININE 1.16 11/20/2017 08:40 AM   GFR 59.73 (L) 12/22/2018 01:33 PM   GFRNONAA 72.28 06/17/2009 08:55 AM   GFRAA 88 03/31/2008 08:41 AM    BMP Latest Ref Rng & Units 12/23/2019 12/22/2018 11/20/2017  Glucose 65 - 99 mg/dL 97 94 111(H)  BUN 7 - 25 mg/dL 20 14 21   Creatinine 0.70 - 1.18 mg/dL 1.25(H) 1.20 1.16  BUN/Creat Ratio 6 - 22 (calc) 16 - -  Sodium 135 - 146 mmol/L 139 139 139  Potassium 3.5 - 5.3 mmol/L 3.9 3.8 3.8  Chloride 98 - 110 mmol/L 103 100 100  CO2 20 - 32 mmol/L 30 32 33(H)  Calcium 8.6 - 10.3 mg/dL 9.0 9.4 8.9    . Current antihypertensive regimen:   Hctz 25 mg daily  Metoprolol succinate 50 mg daily HS   . How often are you checking your Blood Pressure? weekly at the [facility]. Patient states he has not purchased a blood pressure cuff for home yet, but he tries to take it a couple of times a week.  . Current home BP readings: 142/84 was the last reading from one week ago.  . What recent interventions/DTPs have been made by any provider to improve Blood Pressure control since last CPP Visit: none at this time  . Any recent hospitalizations or ED visits since last visit with CPP? No   . What diet changes have been made to improve Blood Pressure Control?  o Patient states he has been drinking water after tea. Reports losing some weight. Has reduced red meat ans sweets.  . What exercise is being done to improve your Blood Pressure Control?   Patient has not been able to start walking in the mornings with his daughter due to pain from a pinched nerve. States he does walk daily about a mile and a half  Inquired with the patient if he was still taking Celebrex. He stated he stopped three to four weeks ago. Is now taking Hydrocodone for pain. Reminded patient of his next appointment with the clinical pharmacist on Tuesday, December 21st at 9:00 am over the phone. He asked that he be contacted at [phone]  Adherence Review: Is the patient  currently on ACE/ARB medication? No Does the patient have >5 day gap between last estimated fill dates? No    Follow-Up:  Pharmacist Review   Fanny Skates, [place] Pharmacist Assistant [phone]  Reviewed by: De Blanch, PharmD Clinical Pharmacist [facility] at [facility] [phone]

## 2020-02-04 DIAGNOSIS — M5136 Other intervertebral disc degeneration, lumbar region: Secondary | ICD-10-CM | POA: Diagnosis not present

## 2020-02-04 NOTE — Addendum Note (Signed)
Addended by: Kelle Darting A on: 02/04/2020 03:09 PM   Modules accepted: Orders

## 2020-02-08 DIAGNOSIS — M5136 Other intervertebral disc degeneration, lumbar region: Secondary | ICD-10-CM | POA: Diagnosis not present

## 2020-02-08 DIAGNOSIS — M180 Bilateral primary osteoarthritis of first carpometacarpal joints: Secondary | ICD-10-CM | POA: Diagnosis not present

## 2020-02-09 ENCOUNTER — Other Ambulatory Visit: Payer: Self-pay

## 2020-02-09 ENCOUNTER — Telehealth (INDEPENDENT_AMBULATORY_CARE_PROVIDER_SITE_OTHER): Payer: Medicare HMO | Admitting: Internal Medicine

## 2020-02-09 ENCOUNTER — Encounter: Payer: Self-pay | Admitting: Internal Medicine

## 2020-02-09 VITALS — Ht 71.0 in

## 2020-02-09 DIAGNOSIS — J069 Acute upper respiratory infection, unspecified: Secondary | ICD-10-CM | POA: Diagnosis not present

## 2020-02-09 MED ORDER — AMOXICILLIN 875 MG PO TABS: 875.0000 mg | ORAL_TABLET | Freq: Two times a day (BID) | ORAL | 0 refills | Status: DC

## 2020-02-09 NOTE — Progress Notes (Signed)
Subjective:    Patient ID: Kirk Sutton, male    DOB: 26-Jul-1950, 69 y.o.   MRN: [id number]  DOS:  02/09/2020 Type of visit - description: Virtual Visit via Video Note  I connected with the above patient  by a video enabled telemedicine application and verified that I am speaking with the correct person using two identifiers.   THIS ENCOUNTER IS A VIRTUAL VISIT DUE TO COVID-19 - PATIENT WAS NOT SEEN IN THE OFFICE. PATIENT HAS CONSENTED TO VIRTUAL VISIT / TELEMEDICINE VISIT   Location of patient: home  Location of provider: office  Persons participating in the virtual visit: patient, provider   I discussed the limitations of evaluation and management by telemedicine and the availability of in person appointments. The patient expressed understanding and agreed to proceed.  Acute Symptoms a started approximately 3 days ago with nasal congestion, small amount of green nasal discharge. Some cough. Good compliance with Symbicort. Also taking Mucinex DM. He is concerned about the upcoming long weekend.  Standby antibiotics?    Review of Systems No fever chills No sore throat No chest pain or difficulty breathing No GI symptoms No myalgias.  Past Medical History:  Diagnosis Date  . Allergic rhinitis   . Asthma   . Chronic prostatitis    sees urology  . Diabetes mellitus    type 11 (a1c 6.0 03/2008)/no meds.  . Hernia, umbilical   . Hyperlipidemia   . Hypertension   . Psoriasis    @ hands, sees derm    Past Surgical History:  Procedure Laterality Date  . APPENDECTOMY    . FERTILITY SURGERY  1979  . HERNIA REPAIR    . NASAL SEPTUM SURGERY  2009  . SHOULDER SURGERY  2008   left  . TESTICLE SURGERY     undescended repair     Allergies as of 02/09/2020      Reactions   Cetirizine Hcl    REACTION: prostatitis   Fexofenadine    REACTION: causes prostatitis      Medication List       Accurate as of February 09, 2020  4:57 PM. If you have any questions, ask  your nurse or doctor.        acetaminophen 500 MG tablet Commonly known as: TYLENOL Take 500 mg by mouth daily as needed for moderate pain.   albuterol 108 (90 Base) MCG/ACT inhaler Commonly known as: VENTOLIN HFA INHALE 2 PUFFS INTO THE LUNGS EVERY 6 HOURS AS NEEDED FOR WHEEZING OR SHORTNESS OF BREATH   amphetamine-dextroamphetamine 10 MG tablet Commonly known as: ADDERALL Take 10 mg by mouth 2 (two) times daily with a meal.   atorvastatin 20 MG tablet Commonly known as: LIPITOR Take 1 tablet (20 mg total) by mouth at bedtime.   azelastine 0.1 % nasal spray Commonly known as: ASTELIN Place 1 spray into both nostrils every evening.   budesonide-formoterol 160-4.5 MCG/ACT inhaler Commonly known as: SYMBICORT Inhale 2 puffs into the lungs 2 (two) times daily.   busPIRone 10 MG tablet Commonly known as: BUSPAR Take 10 mg by mouth 2 (two) times daily.   celecoxib 200 MG capsule Commonly known as: CELEBREX Take 200 mg by mouth daily as needed.   clobetasol ointment 0.05 % Commonly known as: TEMOVATE Apply 1 application topically 2 (two) times daily. For hands   desloratadine 5 MG tablet Commonly known as: CLARINEX Take 5 mg by mouth daily.   FLONASE SENSIMIST NA Place into the nose daily as  needed.   FLUoxetine 40 MG capsule Commonly known as: PROZAC Take 1 tablet daily   hydrochlorothiazide 25 MG tablet Commonly known as: HYDRODIURIL Take 1 tablet (25 mg total) by mouth daily.   Magnesium Gluconate 250 MG Tabs Take 500 mg by mouth daily.   metoprolol succinate 50 MG 24 hr tablet Commonly known as: TOPROL-XL Take 1 tablet (50 mg total) by mouth daily.   Pazeo 0.7 % Soln Generic drug: Olopatadine HCl INT 1 GTT IN OU QD   pyridOXINE 100 MG tablet Commonly known as: VITAMIN B-6 Take 400 mg by mouth daily. Super B complex   terazosin 1 MG capsule Commonly known as: HYTRIN Take 3 capsules (3 mg total) by mouth daily.   Turmeric 500 MG Caps Take 500 mg  by mouth daily.   vitamin C 1000 MG tablet Take 1,000 mg by mouth daily.   vitamin E 180 MG (400 UNITS) capsule Generic drug: vitamin E Take 450 Units by mouth daily.          Objective:   Physical Exam Ht 5\' 11"  (1.803 m)   BMI 31.59 kg/m  This is a virtual video visit.  BP when checked has been 145/75.  Reports no fevers. He looks alert oriented x3, speaking in complete sentences, no cough or chest congestion noted    Assessment     Assessment Prediabetes  HTN Hyperlipidemia Anxiety Dr Fay Records (psych) Rx prozac ~12-2014  Asthma  MSK:  --Chronic back pain, pain medication as needed, used to see Dr Nelva Bush, did PT @ [facility] [phone], better --DJD- hands Chronic prostatitis, sees urology Psoriasis, hands, sees dermatology Snoring (severe per pt)- dentist Rx a moth guard ~ 08-2015, helps  PLAN: URI: Symptoms consistent with URI, does not seem to be acutely ill.  He had 2 Covid vaccinations. We agreed to treat conservatively with rest, fluids, Tylenol, Mucinex DM, continue Symbicort, albuterol as needed. Encouraged to use Flonase and Astelin daily. If he is not gradually better, he will start in few days amoxicillin, prescription sent. On the other hand, if he gets much worse he needs to be check in person. He verbalized understanding.      I discussed the assessment and treatment plan with the patient. The patient was provided an opportunity to ask questions and all were answered. The patient agreed with the plan and demonstrated an understanding of the instructions.   The patient was advised to call back or seek an in-person evaluation if the symptoms worsen or if the condition fails to improve as anticipated.

## 2020-02-10 ENCOUNTER — Other Ambulatory Visit: Payer: Medicare HMO

## 2020-02-10 ENCOUNTER — Other Ambulatory Visit (INDEPENDENT_AMBULATORY_CARE_PROVIDER_SITE_OTHER): Payer: Medicare HMO

## 2020-02-10 ENCOUNTER — Other Ambulatory Visit: Payer: Self-pay

## 2020-02-10 DIAGNOSIS — E785 Hyperlipidemia, unspecified: Secondary | ICD-10-CM | POA: Diagnosis not present

## 2020-02-10 DIAGNOSIS — H35033 Hypertensive retinopathy, bilateral: Secondary | ICD-10-CM | POA: Diagnosis not present

## 2020-02-10 DIAGNOSIS — H0100A Unspecified blepharitis right eye, upper and lower eyelids: Secondary | ICD-10-CM | POA: Diagnosis not present

## 2020-02-10 DIAGNOSIS — H10501 Unspecified blepharoconjunctivitis, right eye: Secondary | ICD-10-CM | POA: Diagnosis not present

## 2020-02-10 DIAGNOSIS — H10413 Chronic giant papillary conjunctivitis, bilateral: Secondary | ICD-10-CM | POA: Diagnosis not present

## 2020-02-10 LAB — LIPID PANEL
Cholesterol: 105 mg/dL (ref 0–200)
HDL: 61.9 mg/dL (ref >39.00)
LDL Cholesterol: 31 mg/dL (ref 0–99)
NonHDL: 43.57
Total CHOL/HDL Ratio: 2
Triglycerides: 65 mg/dL (ref 0.0–149.0)
VLDL: 13 mg/dL (ref 0.0–40.0)

## 2020-02-10 LAB — ALT: ALT: 15 U/L (ref 0–53)

## 2020-02-10 LAB — AST: AST: 15 U/L (ref 0–37)

## 2020-02-13 NOTE — Assessment & Plan Note (Signed)
URI: Symptoms consistent with URI, does not seem to be acutely ill.  He had 2 Covid vaccinations. We agreed to treat conservatively with rest, fluids, Tylenol, Mucinex DM, continue Symbicort, albuterol as needed. Encouraged to use Flonase and Astelin daily. If he is not gradually better, he will start in few days amoxicillin, prescription sent. On the other hand, if he gets much worse he needs to be check in person. He verbalized understanding.

## 2020-02-15 ENCOUNTER — Other Ambulatory Visit: Payer: Self-pay

## 2020-02-15 MED ORDER — ATORVASTATIN CALCIUM 20 MG PO TABS
20.0000 mg | ORAL_TABLET | Freq: Every day | ORAL | 3 refills | Status: DC
Start: 2020-02-15 — End: 2021-02-01

## 2020-02-17 DIAGNOSIS — N400 Enlarged prostate without lower urinary tract symptoms: Secondary | ICD-10-CM | POA: Diagnosis not present

## 2020-02-24 DIAGNOSIS — N400 Enlarged prostate without lower urinary tract symptoms: Secondary | ICD-10-CM | POA: Diagnosis not present

## 2020-02-24 DIAGNOSIS — N138 Other obstructive and reflux uropathy: Secondary | ICD-10-CM | POA: Diagnosis not present

## 2020-02-24 DIAGNOSIS — N529 Male erectile dysfunction, unspecified: Secondary | ICD-10-CM | POA: Diagnosis not present

## 2020-03-01 DIAGNOSIS — L821 Other seborrheic keratosis: Secondary | ICD-10-CM | POA: Diagnosis not present

## 2020-03-01 DIAGNOSIS — D1801 Hemangioma of skin and subcutaneous tissue: Secondary | ICD-10-CM | POA: Diagnosis not present

## 2020-03-01 DIAGNOSIS — L2089 Other atopic dermatitis: Secondary | ICD-10-CM | POA: Diagnosis not present

## 2020-03-01 DIAGNOSIS — C44529 Squamous cell carcinoma of skin of other part of trunk: Secondary | ICD-10-CM | POA: Diagnosis not present

## 2020-03-03 DIAGNOSIS — M1812 Unilateral primary osteoarthritis of first carpometacarpal joint, left hand: Secondary | ICD-10-CM | POA: Diagnosis not present

## 2020-03-03 DIAGNOSIS — M180 Bilateral primary osteoarthritis of first carpometacarpal joints: Secondary | ICD-10-CM | POA: Diagnosis not present

## 2020-03-03 DIAGNOSIS — M5416 Radiculopathy, lumbar region: Secondary | ICD-10-CM | POA: Diagnosis not present

## 2020-03-03 DIAGNOSIS — M1811 Unilateral primary osteoarthritis of first carpometacarpal joint, right hand: Secondary | ICD-10-CM | POA: Diagnosis not present

## 2020-03-03 DIAGNOSIS — M5136 Other intervertebral disc degeneration, lumbar region: Secondary | ICD-10-CM | POA: Diagnosis not present

## 2020-03-08 ENCOUNTER — Ambulatory Visit: Payer: Medicare HMO | Admitting: Pharmacist

## 2020-03-08 DIAGNOSIS — I10 Essential (primary) hypertension: Secondary | ICD-10-CM

## 2020-03-08 DIAGNOSIS — E785 Hyperlipidemia, unspecified: Secondary | ICD-10-CM

## 2020-03-08 NOTE — Patient Instructions (Addendum)
Visit Information  Goals Addressed            This Visit's Progress   . Chronic Care Management Pharmacy Care Plan       CARE PLAN ENTRY (see longitudinal plan of care for additional care plan information)  Current Barriers:  . Chronic Disease Management support, education, and care coordination needs related to Asthma, Pre-Diabetes, Hypertension, Hyperlipidemia, Anxiety, BPH, Allergies   Hypertension BP Readings from Last 3 Encounters:  12/23/19 (!) 160/92  10/13/19 (!) 129/79  12/22/18 (!) 158/86   . Pharmacist Clinical Goal(s): o Over the next 90 days, patient will work with PharmD and providers to achieve BP goal <140/90 . Current regimen:   Hctz 25mg  daily AM  Metoprolol succinate 50mg  daily HS . Interventions: o Requested patient to check blood pressure 1-2 times per week after taking his hctz in the morning o Consider stopping celecoxib/Celebrex for pain (this could be contributing to increase in blood pressure) o Check BP daily 30 minutes to 1 hour after taking BP medication o Increase exercise to at least 2-3 times per week for 40 minutes. Consider going in the morning with [name] when she does her early morning exercises o Drink one cup of water for every cup of tea . Patient self care activities - Over the next 90 days, patient will: o Check BP 1-2 times per week after taking hctz, document, and provide at future appointments o Ensure daily salt intake < 2300 mg/Kirk Sutton o Consider stopping celecoxib/Celebrex for pain (this could be contributing to increase in blood pressure) o Check BP daily 30 minutes to 1 hour after taking BP medication o Increase exercise to at least 2-3 times per week for 40 minutes. Consider going in the morning with [name] when she does her early morning exercises o Drink one cup of water for every cup of tea  Hyperlipidemia/ASCVD Risk Lab Results  Component Value Date/Time   LDLCALC 31 02/10/2020 01:10 PM   [place] 83 12/23/2019 08:28 AM    . Pharmacist Clinical Goal(s): o Over the next 90 days, patient will work with PharmD and providers to maintain LDL goal < 100 . Current regimen:  o Atorvastatin 20mg  daily . Interventions: o Discussed ASCVD Risk and potential benefit for statin therapy noting pt's risk (started on atorvastatin) . Patient self care activities - Over the next 90 days, patient will: o Work to lower blood pressure and stress  Pre-Diabetes Lab Results  Component Value Date/Time   HGBA1C 5.5 12/23/2019 08:28 AM   HGBA1C 6.1 12/25/2018 10:01 AM   . Pharmacist Clinical Goal(s): o Over the next 90 days, patient will work with PharmD and providers to maintain A1c goal <6.5% . Current regimen:  o Diet and exercise management   . Interventions: o Discussed importance of diet and exercise o Patient now below pre-diabetes range!!!! Congratulations!!! . Patient self care activities - Over the next 90 days, patient will: o Maintain a1c less than 6.5%  Medication management . Pharmacist Clinical Goal(s): o Over the next 90 days, patient will work with PharmD and providers to maintain optimal medication adherence . Current pharmacy: [facility] . Interventions o Comprehensive medication review performed. o Continue current medication management strategy o Discussed possible drug interaction with CBD and patient's current medication regimen - CYP 3A4 Substrates (these medications can have increased concentration and potential increase in side effects) . Atorvastatin . Buspirone - CYP 2D6 Substrates (these medications can have increased concentration and potential increase in side effects) . Adderal .  Metoprolol - CYP 1A2 Substrates (these medications can have increased concentration and potential increase in side effects) . Tylenol . Patient self care activities - Over the next 90 days, patient will: o Focus on medication adherence by filling and taking medications appropriately  o Take medications as  prescribed o Report any questions or concerns to PharmD and/or provider(s)  Please see past updates related to this goal by clicking on the "Past Updates" button in the selected goal         The patient verbalized understanding of instructions, educational materials, and care plan provided today and agreed to receive a mailed copy of patient instructions, educational materials, and care plan.   Telephone follow up appointment with pharmacy team member scheduled for: 06/08/2020  Kirk Sutton, [facility]

## 2020-03-08 NOTE — Chronic Care Management (AMB) (Signed)
Chronic Care Management Pharmacy  Name: GERRAD WELKER  MRN: [id number] DOB: [birth year]   Chief Complaint/ HPI  [name],  69 y.o. , male presents for their Follow-Up CCM visit with the clinical pharmacist via telephone due to COVID-19 Pandemic.  PCP : Colon Branch, MD  Their chronic conditions include: Asthma, Pre-Diabetes, Hypertension, Hyperlipidemia, Anxiety, BPH, Allergies  Office Visits: 02/09/20: Visit w/ Dr. Larose Kells - URI symptoms. Treat conservatively with rest, fluids, tylenol, mucinex dm, continue symbicort, albuterol as needed. Flonase and Astelin daily. If not gradually better can start amoxicillin in a few days. If much worse RTC in person.   Consult Visit: 03/03/20: Ortho visit w/ Dr. Nelva Bush  02/24/20: Urology visit w/ Dr. Thomasene Mohair - PSA: 0.26, Post void residual 35 down from 45 at previous visit. Rectal exam shows 20g benign gland with adequate channel. Plan: Annual PSA DRE PVR. Sildenafil 5m.  Medications: Outpatient Encounter Medications as of 03/08/2020  Medication Sig Note  . HYDROcodone-acetaminophen (NORCO/VICODIN) 5-325 MG tablet Take 1 tablet by mouth 3 (three) times daily as needed. 03/08/2020: Per ortho  . acetaminophen (TYLENOL) 500 MG tablet Take 500 mg by mouth daily as needed for moderate pain. 12/05/2015: PRN  . albuterol (VENTOLIN HFA) 108 (90 Base) MCG/ACT inhaler INHALE 2 PUFFS INTO THE LUNGS EVERY 6 HOURS AS NEEDED FOR WHEEZING OR SHORTNESS OF BREATH   . amoxicillin (AMOXIL) 875 MG tablet Take 1 tablet (875 mg total) by mouth 2 (two) times daily.   .[name] Kitchenamphetamine-dextroamphetamine (ADDERALL) 10 MG tablet Take 10 mg by mouth 2 (two) times daily with a meal.   . Ascorbic Acid (VITAMIN C) 1000 MG tablet Take 1,000 mg by mouth daily.   .[name] Kitchenatorvastatin (LIPITOR) 20 MG tablet Take 1 tablet (20 mg total) by mouth at bedtime.   .[name] Kitchenazelastine (ASTELIN) 0.1 % nasal spray Place 1 spray into both nostrils every evening.   . budesonide-formoterol (SYMBICORT)  160-4.5 MCG/ACT inhaler Inhale 2 puffs into the lungs 2 (two) times daily.   . busPIRone (BUSPAR) 10 MG tablet Take 10 mg by mouth 2 (two) times daily.   . celecoxib (CELEBREX) 200 MG capsule Take 200 mg by mouth daily as needed. (Patient not taking: Reported on 03/08/2020)   . clobetasol ointment (TEMOVATE) 07.32% Apply 1 application topically 2 (two) times daily. For hands   . desloratadine (CLARINEX) 5 MG tablet Take 5 mg by mouth daily.   .[name] KitchenFLUoxetine (PROZAC) 40 MG capsule Take 1 tablet daily   . Fluticasone Furoate (FLONASE SENSIMIST NA) Place into the nose daily as needed.   . hydrochlorothiazide (HYDRODIURIL) 25 MG tablet Take 1 tablet (25 mg total) by mouth daily.   . Magnesium Gluconate 250 MG TABS Take 500 mg by mouth daily.    . metoprolol succinate (TOPROL-XL) 50 MG 24 hr tablet Take 1 tablet (50 mg total) by mouth daily.   .[name] KitchenPAZEO 0.7 % SOLN INT 1 GTT IN OU QD 02/23/2015: PRN  . pyridOXINE (VITAMIN B-6) 100 MG tablet Take 400 mg by mouth daily. Super B complex   . terazosin (HYTRIN) 1 MG capsule Take 3 capsules (3 mg total) by mouth daily.   . Turmeric 500 MG CAPS Take 500 mg by mouth daily.    . vitamin E (VITAMIN E) 400 UNIT capsule Take 450 Units by mouth daily.     No facility-administered encounter medications on file as of 03/08/2020.   SDOH Screenings   Alcohol Screen: Not on file  Depression (  PHQ2-9): Low Risk   . PHQ-2 Score: 0  Financial Resource Strain: Low Risk   . Difficulty of Paying Living Expenses: Not hard at all  Food Insecurity: Not on file  Housing: Not on file  Physical Activity: Insufficiently Active  . Days of Exercise per Week: 1 Chayah Mckee  . Minutes of Exercise per Session: 20 min  Social Connections: Not on file  Stress: Not on file  Tobacco Use: Low Risk   . Smoking Tobacco Use: Never Smoker  . Smokeless Tobacco Use: Never Used  Transportation Needs: Not on file     Current Diagnosis/Assessment:  Goals Addressed            This Visit's  Progress   . Chronic Care Management Pharmacy Care Plan       CARE PLAN ENTRY (see longitudinal plan of care for additional care plan information)  Current Barriers:  . Chronic Disease Management support, education, and care coordination needs related to Asthma, Pre-Diabetes, Hypertension, Hyperlipidemia, Anxiety, BPH, Allergies   Hypertension BP Readings from Last 3 Encounters:  12/23/19 (!) 160/92  10/13/19 (!) 129/79  12/22/18 (!) 158/86   . Pharmacist Clinical Goal(s): o Over the next 90 days, patient will work with PharmD and providers to achieve BP goal <140/90 . Current regimen:   Hctz 81m daily AM  Metoprolol succinate 580mdaily HS . Interventions: o Requested patient to check blood pressure 1-2 times per week after taking his hctz in the morning o Consider stopping celecoxib/Celebrex for pain (this could be contributing to increase in blood pressure) o Check BP daily 30 minutes to 1 hour after taking BP medication o Increase exercise to at least 2-3 times per week for 40 minutes. Consider going in the morning with LiMendel Ryderhen she does her early morning exercises o Drink one cup of water for every cup of tea . Patient self care activities - Over the next 90 days, patient will: o Check BP 1-2 times per week after taking hctz, document, and provide at future appointments o Ensure daily salt intake < 2300 mg/Horacio Werth o Consider stopping celecoxib/Celebrex for pain (this could be contributing to increase in blood pressure) o Check BP daily 30 minutes to 1 hour after taking BP medication o Increase exercise to at least 2-3 times per week for 40 minutes. Consider going in the morning with LiMendel Ryderhen she does her early morning exercises o Drink one cup of water for every cup of tea  Hyperlipidemia/ASCVD Risk Lab Results  Component Value Date/Time   LDLCALC 31 02/10/2020 01:10 PM   LDWoodburn3 12/23/2019 08:28 AM   . Pharmacist Clinical Goal(s): o Over the next 90 days,  patient will work with PharmD and providers to maintain LDL goal < 100 . Current regimen:  o Atorvastatin 2073maily . Interventions: o Discussed ASCVD Risk and potential benefit for statin therapy noting pt's risk (started on atorvastatin) . Patient self care activities - Over the next 90 days, patient will: o Work to lower blood pressure and stress  Pre-Diabetes Lab Results  Component Value Date/Time   HGBA1C 5.5 12/23/2019 08:28 AM   HGBA1C 6.1 12/25/2018 10:01 AM   . Pharmacist Clinical Goal(s): o Over the next 90 days, patient will work with PharmD and providers to maintain A1c goal <6.5% . Current regimen:  o Diet and exercise management   . Interventions: o Discussed importance of diet and exercise o Patient now below pre-diabetes range!!!! Congratulations!!! . Patient self care activities - Over the next  90 days, patient will: o Maintain a1c less than 6.5%  Medication management . Pharmacist Clinical Goal(s): o Over the next 90 days, patient will work with PharmD and providers to maintain optimal medication adherence . Current pharmacy: [facility] . Interventions o Comprehensive medication review performed. o Continue current medication management strategy o Discussed possible drug interaction with CBD and patient's current medication regimen - CYP 3A4 Substrates (these medications can have increased concentration and potential increase in side effects) . Atorvastatin . Buspirone - CYP 2D6 Substrates (these medications can have increased concentration and potential increase in side effects) . Adderal . Metoprolol - CYP 1A2 Substrates (these medications can have increased concentration and potential increase in side effects) . Tylenol . Patient self care activities - Over the next 90 days, patient will: o Focus on medication adherence by filling and taking medications appropriately  o Take medications as prescribed o Report any questions or concerns to PharmD and/or  provider(s)  Please see past updates related to this goal by clicking on the "Past Updates" button in the selected goal       Social Hx:  Married for 48 years.  Has 2 daughters ages 105 and 73. [name] (71 y/o) lives close to him. Youngest daughter lives in [place]. He has 6 grandchildren.  Lindsey exercises early in the morning each Jozlin Bently. She takes a jog with her dog.   Uses a pill box. Fills it out weekly   Squamous Cell Carcinoma   Followed by Derm  Patient has failed these meds in past: None noted  Patient is currently controlled on the following medications: . None  Had squamous cell carcinoma removed on his chest.  Placing aquaphor on it daily Going back in 6 months Wanted [place] to be aware  Plan -Continue current management and follow up with Derm  Back Pain/Degenerative Join Disease   Patient has failed these meds in past: None noted  Patient is currently uncontrolled on the following medications: . Hydrocodone 5/374m three times daily as needed (prescribed per ortho) . Turmeric 5058mdaily . CBD 10051mummies daily  Patient no longer taking celebrex He is currently taking CBD 100m71mmmies and has tolerated this well. Patient asks my opinion of him moving up to 300mg78m supplements.  Discussed that patient has significant med list and should consider possible drug interactions.  Ran patient's medication list through interaction checker with CBD 300mg 2mlements included.  In general CBD has been shown to inhibit enzymes in vitro specifically CYP 3A4, 2D6, and 1A2. This means that substrates of these enzymes could potentially have increased concentrations in the body due to limited metabolism with an inhibited enzyme.  The medications of most concern are the following:   CYP 3A4 Substrates (these medications can have increased concentration and potential increase in side effects) Atorvastatin Buspirone  CYP 2D6 Substrates (these medications can have increased  concentration and potential increase in side effects) Adderal Metoprolol  CYP 1A2 Substrates (these medications can have increased concentration and potential increase in side effects) Tylenol  Discussed what the potential risk is with patient, noting each person's body is different and he could be fine, but these are the proposed risks. Patient is very grateful for information noting he has a biology degree and understands the concept of enzyme inhibition. He has decided he would like to stop taking CBD.  Plan -Continue current medications with exception of CBC supplements noting potential drug interactions with current prescribed regimen  Hypertension   BP goal is:  <  140/90  Office blood pressures are  BP Readings from Last 3 Encounters:  12/23/19 (!) 160/92  10/13/19 (!) 129/79  12/22/18 (!) 158/86   Patient checks BP at home infrequently Patient home BP readings are ranging: 140s per patient  Patient has failed these meds in the past: None noted Patient is currently uncontrolled on the following medications:   Hctz 33m daily AM  Metoprolol succinate 543mdaily HS  Terazosin 26m57m3 capsules daily (more so for BPH)  Problem Story Diet Query Keto diet; reduced carbohydrate consumption, reduced serving sizes.  Loves greens (not fan of asparagus or broccoli) B - [employer] yogurt; eggs and sausage or back L - Avoids french fries. Salad and burger with no bread; pizza from [employer] with cauliflower crust; seafood broils; turKuwaitub D - Steamed veggies; burger with chili and slaw without bun Snacks - Seldom but when he does he does a keto friendly snack or raisens or cranberries Drinks - "lots of unsweet tea" when he goes to a restaurant he drinks one there an always takes one to go. He sweetens his tea with sweet and low or stevia. He wonders if he should decrease consumption of tea, discussed that he could drink a cup of water for every cup of tea. He is agreeable to  this.  He has a goal to continue losing weight  Exercise Limited due to his back, but he knows he needs to be more active. He feels his physical therapy is helping him with his back pain. He wonders how he can get more exercise in. Discussed him exercising with his daughter LinMendel Ryder the morning. He thinks this is a good idea so that he can get it done and will have an accountability partner.  He has a goal to increase his exercise.   We discussed diet and exercise extensively and That celebrex could lead to increase in blood pressure. Discussed that arm BP cuffs are more accurate than wrist BP cuffs. Discussed goal HR while patient is taking metoprolol.   Update 03/08/20 144/92 states his BP is around this since about 01/11/20 Not taking celebrex, taking hydrocodone for pain prescribed by ortho (Dr. RamNelva Bushalking 1-1.5 miles per Peretz Thieme  Plan -Purchase arm BP cuff -Check BP daily 30 minutes to 1 hour after taking BP medication -Increase exercise to at least 2-3 times per week for 40 minutes. Consider going in the morning with LinMendel Ryderen she does her early morning exercises -Drink one cup of water for every cup of tea -Continue current medications   Hyperlipidemia   LDL goal < 100  Lipid Panel     Component Value Date/Time   CHOL 105 02/10/2020 1310   TRIG 65.0 02/10/2020 1310   TRIG 52 02/20/2006 1053   HDL 61.90 02/10/2020 1310   LDLCALC 31 02/10/2020 1310   LDLCALC 83 12/23/2019 0828    Hepatic Function Latest Ref Rng & Units 02/10/2020 12/23/2019 12/22/2018  Total Protein 6.1 - 8.1 g/dL - 6.1 6.4  Albumin 3.5 - 5.2 g/dL - - 4.2  AST 0 - 37 U/L 15 18 17   ALT 0 - 53 U/L 15 14 14   Alk Phosphatase 39 - 117 U/L - - 66  Total Bilirubin 0.2 - 1.2 mg/dL - 0.9 1.0  Bilirubin, Direct 0.0 - 0.3 mg/dL - - -     The ASCVD Risk score (GofEl Jebelet al., 2013) failed to calculate for the following reasons:   The valid total cholesterol range  is 130 to 320 mg/dL   Patient has  failed these meds in past: None noted  Patient is currently controlled on the following medications:  . Atorvastatin 24m daily  Update 03/08/20 LDL still within goal Tolerating regimen well Feels he develops bruises more on his hand  Plan -Continue current medications   KDe Blanch PharmD, BCACP Clinical Pharmacist [phone]

## 2020-03-23 DIAGNOSIS — M5416 Radiculopathy, lumbar region: Secondary | ICD-10-CM | POA: Diagnosis not present

## 2020-03-24 DIAGNOSIS — J3089 Other allergic rhinitis: Secondary | ICD-10-CM | POA: Diagnosis not present

## 2020-03-24 DIAGNOSIS — L209 Atopic dermatitis, unspecified: Secondary | ICD-10-CM | POA: Diagnosis not present

## 2020-03-24 DIAGNOSIS — J45991 Cough variant asthma: Secondary | ICD-10-CM | POA: Diagnosis not present

## 2020-03-24 DIAGNOSIS — J301 Allergic rhinitis due to pollen: Secondary | ICD-10-CM | POA: Diagnosis not present

## 2020-03-28 ENCOUNTER — Ambulatory Visit: Payer: Self-pay | Admitting: *Deleted

## 2020-03-28 DIAGNOSIS — M5416 Radiculopathy, lumbar region: Secondary | ICD-10-CM | POA: Diagnosis not present

## 2020-03-31 ENCOUNTER — Encounter: Payer: Self-pay | Admitting: Internal Medicine

## 2020-03-31 ENCOUNTER — Telehealth (INDEPENDENT_AMBULATORY_CARE_PROVIDER_SITE_OTHER): Payer: Self-pay | Admitting: Internal Medicine

## 2020-03-31 VITALS — BP 168/102 | Ht 71.0 in | Wt 228.0 lb

## 2020-03-31 DIAGNOSIS — I10 Essential (primary) hypertension: Secondary | ICD-10-CM

## 2020-03-31 DIAGNOSIS — J4521 Mild intermittent asthma with (acute) exacerbation: Secondary | ICD-10-CM

## 2020-03-31 MED ORDER — PREDNISONE 10 MG PO TABS: ORAL_TABLET | ORAL | 0 refills | Status: DC

## 2020-03-31 NOTE — Assessment & Plan Note (Signed)
Asthma exacerbation, mild: Symptoms a started approximately 3 days ago, he has a history of asthma, on Symbicort twice a day, rarely uses albuterol but has been using more in the last 2 to 3 days. Plan: Started to take Mucinex, continue with it Okay to increase the use of albuterol for few days Prednisone 20 mg x 5 days Check for COVID as a precaution. Let me know the results. Call if not gradually better, call if symptoms severe. HTN: On metoprolol, HCTZ. BP is typically in the 140s/80s. This morning it was elevated, he checked with a new cuff. Rec to continue same medicines and monitor BPs noting that his new cuff may not be accurate. He will check at the [facility] and when he does physical therapy. Follow-up as scheduled for 2 to 3 weeks from today

## 2020-03-31 NOTE — Progress Notes (Signed)
Pre visit review using our clinic review tool, if applicable. No additional management support is needed unless otherwise documented below in the visit note. 

## 2020-03-31 NOTE — Progress Notes (Signed)
Subjective:    Patient ID: Kirk Sutton, male    DOB: [birth year], 70 y.o.   MRN: [id number]  DOS:  03/31/2020 Type of visit - description: Virtual Visit via Video Note  I connected with the above patient  by a video enabled telemedicine application and verified that I am speaking with the correct person using two identifiers.   THIS ENCOUNTER IS A VIRTUAL VISIT DUE TO COVID-19 - PATIENT WAS NOT SEEN IN THE OFFICE. PATIENT HAS CONSENTED TO VIRTUAL VISIT / TELEMEDICINE VISIT   Location of patient: home  Location of provider: office  Persons participating in the virtual visit: patient, provider   I discussed the limitations of evaluation and management by telemedicine and the availability of in person appointments. The patient expressed understanding and agreed to proceed.  Acute He was doing well until approximately 3 days ago when he started coughing and wheezing more than usual. Symptoms are not severe, good compliance with asthma medication, has used his albuterol more frequently, ~ 2 times a day.  Denies chest pain, no DOE. No fever chills No sinus congestion. A very small amount of sputum is noted in the mornings, greenish in color.  His BP was elevated today. Patient concerned about it    Review of Systems See above   Past Medical History:  Diagnosis Date  . Allergic rhinitis   . Asthma   . Chronic prostatitis    sees urology  . Diabetes mellitus    type 11 (a1c 6.0 03/2008)/no meds.  . Hernia, umbilical   . Hyperlipidemia   . Hypertension   . Psoriasis    @ hands, sees derm    Past Surgical History:  Procedure Laterality Date  . APPENDECTOMY    . FERTILITY SURGERY  1979  . HERNIA REPAIR    . NASAL SEPTUM SURGERY  2009  . SHOULDER SURGERY  2008   left  . TESTICLE SURGERY     undescended repair     Allergies as of 03/31/2020      Reactions   Cetirizine Hcl    REACTION: prostatitis   Fexofenadine    REACTION: causes prostatitis      Medication  List       Accurate as of March 31, 2020  9:31 PM. If you have any questions, ask your nurse or doctor.        STOP taking these medications   amoxicillin 875 MG tablet Commonly known as: AMOXIL Stopped by: Kathlene November, MD     TAKE these medications   acetaminophen 500 MG tablet Commonly known as: TYLENOL Take 500 mg by mouth daily as needed for moderate pain.   albuterol 108 (90 Base) MCG/ACT inhaler Commonly known as: VENTOLIN HFA INHALE 2 PUFFS INTO THE LUNGS EVERY 6 HOURS AS NEEDED FOR WHEEZING OR SHORTNESS OF BREATH   amphetamine-dextroamphetamine 10 MG tablet Commonly known as: ADDERALL Take 10 mg by mouth 2 (two) times daily with a meal.   atorvastatin 20 MG tablet Commonly known as: LIPITOR Take 1 tablet (20 mg total) by mouth at bedtime.   azelastine 0.1 % nasal spray Commonly known as: ASTELIN Place 1 spray into both nostrils every evening.   budesonide-formoterol 160-4.5 MCG/ACT inhaler Commonly known as: SYMBICORT Inhale 2 puffs into the lungs 2 (two) times daily.   busPIRone 10 MG tablet Commonly known as: BUSPAR Take 10 mg by mouth 2 (two) times daily.   celecoxib 200 MG capsule Commonly known as: CELEBREX Take 200 mg by  mouth daily as needed.   clobetasol ointment 0.05 % Commonly known as: TEMOVATE Apply 1 application topically 2 (two) times daily. For hands   desloratadine 5 MG tablet Commonly known as: CLARINEX Take 5 mg by mouth daily.   FLONASE SENSIMIST NA Place into the nose daily as needed.   FLUoxetine 40 MG capsule Commonly known as: PROZAC Take 1 tablet daily   hydrochlorothiazide 25 MG tablet Commonly known as: HYDRODIURIL Take 1 tablet (25 mg total) by mouth daily.   HYDROcodone-acetaminophen 5-325 MG tablet Commonly known as: NORCO/VICODIN Take 1 tablet by mouth 3 (three) times daily as needed.   Magnesium Gluconate 250 MG Tabs Take 500 mg by mouth daily.   metoprolol succinate 50 MG 24 hr tablet Commonly known as:  TOPROL-XL Take 1 tablet (50 mg total) by mouth daily.   Pazeo 0.7 % Soln Generic drug: Olopatadine HCl INT 1 GTT IN OU QD   predniSONE 10 MG tablet Commonly known as: DELTASONE 2 tabs a day x 5 days Started by: Kathlene November, MD   pyridOXINE 100 MG tablet Commonly known as: VITAMIN B-6 Take 400 mg by mouth daily. Super B complex   terazosin 1 MG capsule Commonly known as: HYTRIN Take 3 capsules (3 mg total) by mouth daily.   Turmeric 500 MG Caps Take 500 mg by mouth daily.   vitamin C 1000 MG tablet Take 1,000 mg by mouth daily.   vitamin E 180 MG (400 UNITS) capsule Take 450 Units by mouth daily.          Objective:   Physical Exam BP (!) 168/102   Ht 5\' 11"  (1.803 m)   Wt 228 lb (103.4 kg)   BMI 31.80 kg/m  This is a virtual video visit, he is alert oriented x3, I asked him to cough and noticed some large airway congestion, no wheezing.    Assessment     Assessment Prediabetes  HTN Hyperlipidemia Anxiety Dr Fay Records (psych) Rx prozac ~12-2014  Asthma  MSK:  --Chronic back pain, pain medication as needed, used to see Dr Nelva Bush, did PT @ [facility] [phone], better --DJD- hands Chronic prostatitis, sees urology Psoriasis, hands, sees dermatology Snoring (severe per pt)- dentist Rx a moth guard ~ 08-2015, helps  PLAN: Asthma exacerbation, mild: Symptoms a started approximately 3 days ago, he has a history of asthma, on Symbicort twice a day, rarely uses albuterol but has been using more in the last 2 to 3 days. Plan: Started to take Mucinex, continue with it Okay to increase the use of albuterol for few days Prednisone 20 mg x 5 days Check for COVID as a precaution. Let me know the results. Call if not gradually better, call if symptoms severe. HTN: On metoprolol, HCTZ. BP is typically in the 140s/80s. This morning it was elevated, he checked with a new cuff. Rec to continue same medicines and monitor BPs noting that his new cuff may not be accurate. He will  check at the [facility] and when he does physical therapy. Follow-up as scheduled for 2 to 3 weeks from today  I discussed the assessment and treatment plan with the patient. The patient was provided an opportunity to ask questions and all were answered. The patient agreed with the plan and demonstrated an understanding of the instructions.   The patient was advised to call back or seek an in-person evaluation if the symptoms worsen or if the condition fails to improve as anticipated.

## 2020-04-02 ENCOUNTER — Other Ambulatory Visit: Payer: Self-pay

## 2020-04-02 DIAGNOSIS — Z20822 Contact with and (suspected) exposure to covid-19: Secondary | ICD-10-CM

## 2020-04-04 ENCOUNTER — Other Ambulatory Visit: Payer: Self-pay

## 2020-04-05 LAB — NOVEL CORONAVIRUS, NAA: SARS-CoV-2, NAA: NOT DETECTED

## 2020-04-06 NOTE — Progress Notes (Deleted)
Subjective:   Kirk Sutton is a 70 y.o. male who presents for Medicare Annual/Subsequent preventive examination.  Review of Systems    ***       Objective:    There were no vitals filed for this visit. There is no height or weight on file to calculate BMI.  Advanced Directives 03/27/2019 12/24/2016 11/11/2015  Does Patient Have a Medical Advance Directive? Yes Yes Yes  Type of Paramedic of [place];Living will [place];Living will Living will  Does patient want to make changes to medical advance directive? No - Patient declined - -  Copy of [place] in Chart? No - copy requested - No - copy requested    Current Medications (verified) Outpatient Encounter Medications as of 04/07/2020  Medication Sig  . acetaminophen (TYLENOL) 500 MG tablet Take 500 mg by mouth daily as needed for moderate pain.  [name] Kitchen albuterol (VENTOLIN HFA) 108 (90 Base) MCG/ACT inhaler INHALE 2 PUFFS INTO THE LUNGS EVERY 6 HOURS AS NEEDED FOR WHEEZING OR SHORTNESS OF BREATH  . amphetamine-dextroamphetamine (ADDERALL) 10 MG tablet Take 10 mg by mouth 2 (two) times daily with a meal.  . Ascorbic Acid (VITAMIN C) 1000 MG tablet Take 1,000 mg by mouth daily.  [name] Kitchen atorvastatin (LIPITOR) 20 MG tablet Take 1 tablet (20 mg total) by mouth at bedtime.  [name] Kitchen azelastine (ASTELIN) 0.1 % nasal spray Place 1 spray into both nostrils every evening.  . budesonide-formoterol (SYMBICORT) 160-4.5 MCG/ACT inhaler Inhale 2 puffs into the lungs 2 (two) times daily.  . busPIRone (BUSPAR) 10 MG tablet Take 10 mg by mouth 2 (two) times daily.  . celecoxib (CELEBREX) 200 MG capsule Take 200 mg by mouth daily as needed. (Patient not taking: No sig reported)  . clobetasol ointment (TEMOVATE) [id number] % Apply 1 application topically 2 (two) times daily. For hands  . desloratadine (CLARINEX) 5 MG tablet Take 5 mg by mouth daily.  [name] Kitchen FLUoxetine (PROZAC) 40 MG capsule Take 1 tablet daily  .  Fluticasone Furoate (FLONASE SENSIMIST NA) Place into the nose daily as needed.  . hydrochlorothiazide (HYDRODIURIL) 25 MG tablet Take 1 tablet (25 mg total) by mouth daily.  [name] Kitchen HYDROcodone-acetaminophen (NORCO/VICODIN) 5-325 MG tablet Take 1 tablet by mouth 3 (three) times daily as needed.  . Magnesium Gluconate 250 MG TABS Take 500 mg by mouth daily.   . metoprolol succinate (TOPROL-XL) 50 MG 24 hr tablet Take 1 tablet (50 mg total) by mouth daily.  [name] Kitchen PAZEO 0.7 % SOLN INT 1 GTT IN OU QD  . predniSONE (DELTASONE) 10 MG tablet 2 tabs a day x 5 days  . pyridOXINE (VITAMIN B-6) 100 MG tablet Take 400 mg by mouth daily. Super B complex  . terazosin (HYTRIN) 1 MG capsule Take 3 capsules (3 mg total) by mouth daily.  . Turmeric 500 MG CAPS Take 500 mg by mouth daily.   . vitamin E 180 MG (400 UNITS) capsule Take 450 Units by mouth daily.   No facility-administered encounter medications on file as of 04/07/2020.    Allergies (verified) Cetirizine hcl and Fexofenadine   History: Past Medical History:  Diagnosis Date  . Allergic rhinitis   . Asthma   . Chronic prostatitis    sees urology  . Diabetes mellitus    type 11 (a1c 6.0 03/2008)/no meds.  . Hernia, umbilical   . Hyperlipidemia   . Hypertension   . Psoriasis    @ hands, sees derm  Past Surgical History:  Procedure Laterality Date  . APPENDECTOMY    . FERTILITY SURGERY  1979  . HERNIA REPAIR    . NASAL SEPTUM SURGERY  2009  . SHOULDER SURGERY  2008   left  . TESTICLE SURGERY     undescended repair    Family History  Problem Relation Age of Onset  . Diabetes Sister   . Hypertension Mother   . Colon polyps Mother   . Hypertension Sister   . Heart attack Father        x 3 onset age 31, died at 4  . Colon polyps Father   . Cancer Father        bladder  . Colon cancer Neg Hx   . Prostate cancer Neg Hx   . Stomach cancer Neg Hx    Social History   Socioeconomic History  . Marital status: Married    Spouse name:  Not on file  . Number of children: 2  . Years of education: Not on file  . Highest education level: Not on file  Occupational History  . Occupation: Administrator, arts  Tobacco Use  . Smoking status: Never Smoker  . Smokeless tobacco: Never Used  Vaping Use  . Vaping Use: Never used  Substance and Sexual Activity  . Alcohol use: Yes    Comment: socially, rare  . Drug use: No  . Sexual activity: Not on file  Other Topics Concern  . Not on file  Social History Narrative   Household-- pt, wife    Social Determinants of Health   Financial Resource Strain: Low Risk   . Difficulty of Paying Living Expenses: Not hard at all  Food Insecurity: Not on file  Transportation Needs: Not on file  Physical Activity: Insufficiently Active  . Days of Exercise per Week: 1 day  . Minutes of Exercise per Session: 20 min  Stress: Not on file  Social Connections: Not on file    Tobacco Counseling Counseling given: Not Answered   Clinical Intake:                 Diabetic?No         Activities of Daily Living In your present state of health, do you have any difficulty performing the following activities: 03/31/2020  Hearing? N  Vision? N  Difficulty concentrating or making decisions? N  Walking or climbing stairs? N  Dressing or bathing? N  Doing errands, shopping? N  Some recent data might be hidden    Patient Care Team: Colon Branch, MD as PCP - General Thomasene Mohair Verner Chol., MD as Consulting Physician (Urology) Calvert Cantor, MD as Consulting Physician (Ophthalmology) Orbie Hurst, MD as Consulting Physician (Dermatology) Gardiner Barefoot, DPM as Consulting Physician (Podiatry) Roseanne Kaufman, MD as Consulting Physician (Orthopedic Surgery) Ricard Dillon, MD (Psychiatry) Suella Broad, MD as Consulting Physician (Physical Medicine and Rehabilitation) Day, Melvenia Beam, [facility] as Pharmacist (Pharmacist) Tiajuana Amass, MD as Referring  Physician (Allergy and Immunology)  Indicate any recent Medical Services you may have received from other than [facility] providers in the past year (date may be approximate).     Assessment:   This is a routine wellness examination for Kirk Sutton.  Hearing/Vision screen No exam data present  Dietary issues and exercise activities discussed:    Goals    . A1c goal less than 6.5%    . Blood pressure goal less than 140/90    . Check blood [facility] or at [facility] and record readings    . Chronic Care Management Pharmacy Care Plan     CARE PLAN ENTRY (see longitudinal plan of care for additional care plan information)  Current Barriers:  . Chronic Disease Management support, education, and care coordination needs related to Asthma, Pre-Diabetes, Hypertension, Hyperlipidemia, Anxiety, BPH, Allergies   Hypertension BP Readings from Last 3 Encounters:  12/23/19 (!) 160/92  10/13/19 (!) 129/79  12/22/18 (!) 158/86   . Pharmacist Clinical Goal(s): o Over the next 90 days, patient will work with PharmD and providers to achieve BP goal <140/90 . Current regimen:   Hctz 25mg  daily AM  Metoprolol succinate 50mg  daily HS . Interventions: o Requested patient to check blood pressure 1-2 times per week after taking his hctz in the morning o Consider stopping celecoxib/Celebrex for pain (this could be contributing to increase in blood pressure) o Check BP daily 30 minutes to 1 hour after taking BP medication o Increase exercise to at least 2-3 times per week for 40 minutes. Consider going in the morning with [name] when she does her early morning exercises o Drink one cup of water for every cup of tea . Patient self care activities - Over the next 90 days, patient will: o Check BP 1-2 times per week after taking hctz, document, and provide at future appointments o Ensure daily salt intake < 2300 mg/day o Consider stopping celecoxib/Celebrex for pain (this could be  contributing to increase in blood pressure) o Check BP daily 30 minutes to 1 hour after taking BP medication o Increase exercise to at least 2-3 times per week for 40 minutes. Consider going in the morning with [name] when she does her early morning exercises o Drink one cup of water for every cup of tea  Hyperlipidemia/ASCVD Risk Lab Results  Component Value Date/Time   LDLCALC 31 02/10/2020 01:10 PM   [place] 83 12/23/2019 08:28 AM   . Pharmacist Clinical Goal(s): o Over the next 90 days, patient will work with PharmD and providers to maintain LDL goal < 100 . Current regimen:  o Atorvastatin 20mg  daily . Interventions: o Discussed ASCVD Risk and potential benefit for statin therapy noting pt's risk (started on atorvastatin) . Patient self care activities - Over the next 90 days, patient will: o Work to lower blood pressure and stress  Pre-Diabetes Lab Results  Component Value Date/Time   HGBA1C 5.5 12/23/2019 08:28 AM   HGBA1C 6.1 12/25/2018 10:01 AM   . Pharmacist Clinical Goal(s): o Over the next 90 days, patient will work with PharmD and providers to maintain A1c goal <6.5% . Current regimen:  o Diet and exercise management   . Interventions: o Discussed importance of diet and exercise o Patient now below pre-diabetes range!!!! Congratulations!!! . Patient self care activities - Over the next 90 days, patient will: o Maintain a1c less than 6.5%  Medication management . Pharmacist Clinical Goal(s): o Over the next 90 days, patient will work with PharmD and providers to maintain optimal medication adherence . Current pharmacy: [facility] . Interventions o Comprehensive medication review performed. o Continue current medication management strategy o Discussed possible drug interaction with CBD and patient's current medication regimen - CYP 3A4 Substrates (these medications can have increased concentration and potential increase in side  effects) . Atorvastatin . Buspirone - CYP 2D6 Substrates (these medications can have increased concentration and potential increase in side effects) . Adderal . Metoprolol - CYP 1A2 Substrates (these medications can  have increased concentration and potential increase in side effects) . Tylenol . Patient self care activities - Over the next 90 days, patient will: o Focus on medication adherence by filling and taking medications appropriately  o Take medications as prescribed o Report any questions or concerns to PharmD and/or provider(s)  Please see past updates related to this goal by clicking on the "Past Updates" button in the selected goal      . Consider practicing meditation to help with anxiety and to lower blood pressure    . DIET - INCREASE WATER INTAKE    . Increase physical activity    . Weight (lb) < 215 lb (97.5 kg)      Depression Screen PHQ 2/9 Scores 03/31/2020 03/27/2019 04/29/2018 05/22/2017 11/11/2015 09/07/2015 09/03/2014  PHQ - 2 Score 0 0 1 0 0 0 0  PHQ- 9 Score - - 2 - - - -    Fall Risk Fall Risk  03/31/2020 03/27/2019 12/22/2018 05/22/2017 11/11/2015  Falls in the past year? 0 0 0 No No  Number falls in past yr: 0 - - - -  Injury with Fall? 0 - - - -  Follow up - Education provided;Falls prevention discussed Falls evaluation completed - -    FALL RISK PREVENTION PERTAINING TO THE HOME:  Any stairs in or around the home? {YES/NO:21197} If so, are there any without handrails? {YES/NO:21197} Home free of loose throw rugs in walkways, pet beds, electrical cords, etc? {YES/NO:21197} Adequate lighting in your home to reduce risk of falls? {YES/NO:21197}  ASSISTIVE DEVICES UTILIZED TO PREVENT FALLS:  Life alert? {YES/NO:21197} Use of a cane, walker or w/c? {YES/NO:21197} Grab bars in the bathroom? {YES/NO:21197} Shower chair or bench in shower? {YES/NO:21197} Elevated toilet seat or a handicapped toilet? {YES/NO:21197}  TIMED UP AND GO:  Was the test performed?  {YES/NO:21197}.  Length of time to ambulate 10 feet: *** sec.   {Appearance of Gait:2101803}  Cognitive Function: MMSE - Mini Mental State Exam 11/11/2015  Orientation to time 5  Orientation to Place 5  Registration 3  Attention/ Calculation 5  Recall 3  Language- name 2 objects 2  Language- repeat 1  Language- follow 3 step command 3  Language- read & follow direction 1  Write a sentence 1  Copy design 1  Total score 30        Immunizations Immunization History  Administered Date(s) Administered  . Fluad Quad(high Dose 65+) 12/22/2018  . H1N1 03/31/2008  . Influenza Split 01/22/2011, 01/10/2012  . Influenza Whole 01/17/2007, 12/24/2007, 11/17/2009  . Influenza, High Dose Seasonal PF 02/23/2015, 01/13/2016, 01/02/2017, 01/16/2018  . Influenza,inj,Quad PF,6+ Mos 01/20/2013, 03/08/2014, 12/23/2019  . PFIZER(Purple Top)SARS-COV-2 Vaccination 05/21/2019, 07/13/2019  . Pneumococcal Conjugate-13 09/03/2014  . Pneumococcal Polysaccharide-23 09/01/2013  . Td 03/19/2001  . Tdap 08/22/2011  . Zoster 08/22/2011    TDAP status: Up to date  Flu Vaccine status: Up to date  Pneumococcal vaccine status: Up to date  {Covid-19 vaccine status:2101808}  Qualifies for Shingles Vaccine? Yes   Zostavax completed Yes   Shingrix Completed?: No.    Education has been provided regarding the importance of this vaccine. Patient has been advised to call insurance company to determine out of pocket expense if they have not yet received this vaccine. Advised may also receive vaccine at local pharmacy or Health Dept. Verbalized acceptance and understanding.  Screening Tests Health Maintenance  Topic Date Due  . COVID-19 Vaccine (3 - Booster for Pfizer series) 01/12/2020  . COLONOSCOPY (  Pts 45-43yrs Insurance coverage will need to be confirmed)  02/19/2020  . TETANUS/TDAP  08/21/2021  . INFLUENZA VACCINE  Completed  . Hepatitis C Screening  Completed  . PNA vac Low Risk Adult  Completed     Health Maintenance  Health Maintenance Due  Topic Date Due  . COVID-19 Vaccine (3 - Booster for Pfizer series) 01/12/2020  . COLONOSCOPY (Pts 45-8yrs Insurance coverage will need to be confirmed)  02/19/2020    {Colorectal cancer screening:2101809}  Lung Cancer Screening: (Low Dose CT Chest recommended if Age 65-80 years, 30 pack-year currently smoking OR have quit w/in 15years.) does not qualify.     Additional Screening:  Hepatitis C Screening:  Completed 02/23/2015  Vision Screening: Recommended annual ophthalmology exams for early detection of glaucoma and other disorders of the eye. Is the patient up to date with their annual eye exam?  {YES/NO:21197} Who is the provider or what is the name of the office in which the patient attends annual eye exams? *** If pt is not established with a provider, would they like to be referred to a provider to establish care? {YES/NO:21197}.   Dental Screening: Recommended annual dental exams for proper oral hygiene  Community Resource Referral / Chronic Care Management: CRR required this visit?  {YES/NO:21197}  CCM required this visit?  {YES/NO:21197}     Plan:     I have personally reviewed and noted the following in the patient's chart:   . Medical and social history . Use of alcohol, tobacco or illicit drugs  . Current medications and supplements . Functional ability and status . Nutritional status . Physical activity . Advanced directives . List of other physicians . Hospitalizations, surgeries, and ER visits in previous 12 months . Vitals . Screenings to include cognitive, depression, and falls . Referrals and appointments  In addition, I have reviewed and discussed with patient certain preventive protocols, quality metrics, and best practice recommendations. A written personalized care plan for preventive services as well as general preventive health recommendations were provided to patient.     Marta Antu,  LPN   08/03/6158  Nurse Health Advisor  Nurse Notes: ***

## 2020-04-07 ENCOUNTER — Other Ambulatory Visit: Payer: Self-pay

## 2020-04-07 ENCOUNTER — Ambulatory Visit (HOSPITAL_BASED_OUTPATIENT_CLINIC_OR_DEPARTMENT_OTHER)
Admission: RE | Admit: 2020-04-07 | Discharge: 2020-04-07 | Disposition: A | Discharge status: Home / Self Care | Payer: Medicare Other | Source: Ambulatory Visit | Attending: Internal Medicine | Admitting: Internal Medicine

## 2020-04-07 ENCOUNTER — Ambulatory Visit: Payer: Medicare Other

## 2020-04-07 ENCOUNTER — Telehealth (INDEPENDENT_AMBULATORY_CARE_PROVIDER_SITE_OTHER): Payer: Medicare Other | Admitting: Internal Medicine

## 2020-04-07 VITALS — Ht 71.0 in | Wt 228.0 lb

## 2020-04-07 DIAGNOSIS — R059 Cough, unspecified: Secondary | ICD-10-CM | POA: Insufficient documentation

## 2020-04-07 DIAGNOSIS — J4541 Moderate persistent asthma with (acute) exacerbation: Secondary | ICD-10-CM | POA: Diagnosis not present

## 2020-04-07 MED ORDER — AZITHROMYCIN 250 MG PO TABS: ORAL_TABLET | ORAL | 0 refills | Status: DC

## 2020-04-07 MED ORDER — PREDNISONE 10 MG PO TABS: ORAL_TABLET | ORAL | 0 refills | Status: DC

## 2020-04-07 NOTE — Progress Notes (Signed)
Pre visit review using our clinic review tool, if applicable. No additional management support is needed unless otherwise documented below in the visit note. 

## 2020-04-07 NOTE — Assessment & Plan Note (Signed)
Asthma exacerbation: Started approximately 10 days ago, see my previous note, low-dose of prednisone helped temporarily, COVID test was negative. He does not have fever or chills. Plan: Chest x-ray Second round of prednisone, Zithromax, continue Mucinex. Continue Symbicort twice daily (may need to switch to Advair due to cost in the near future), albuterol as needed. If not improving gradually in the next 2 weeks he will let me know.

## 2020-04-07 NOTE — Progress Notes (Signed)
Subjective:    Patient ID: Kirk Sutton, male    DOB: 05-Sep-1950, 70 y.o.   MRN: [id number]  DOS:  04/07/2020 Type of visit - description: Virtual Visit via Telephone    I connected with above mentioned patient  by telephone and verified that I am speaking with the correct person using two identifiers.  THIS ENCOUNTER IS A VIRTUAL VISIT DUE TO COVID-19 - PATIENT WAS NOT SEEN IN THE OFFICE. PATIENT HAS CONSENTED TO VIRTUAL VISIT / TELEMEDICINE VISIT   Location of patient: home  Location of provider: office  Persons participating in the virtual visit: patient, provider   I discussed the limitations, risks, security and privacy concerns of performing an evaluation and management service by telephone and the availability of in person appointments. I also discussed with the patient that there may be a patient responsible charge related to this service. The patient expressed understanding and agreed to proceed.  Acute Symptoms started 03/29/2019, he was evaluated virtually for cough, a COVID test was subsequently done and it was negative. He was prescribed prednisone for asthma exacerbation, he did improve temporarily but nevertheless continue with dry cough and chest congestion.  Occasional wheezing.  No fever chills No nausea vomiting No myalgias     Review of Systems See above   Past Medical History:  Diagnosis Date  . Allergic rhinitis   . Asthma   . Chronic prostatitis    sees urology  . Diabetes mellitus    type 11 (a1c 6.0 03/2008)/no meds.  . Hernia, umbilical   . Hyperlipidemia   . Hypertension   . Psoriasis    @ hands, sees derm    Past Surgical History:  Procedure Laterality Date  . APPENDECTOMY    . FERTILITY SURGERY  1979  . HERNIA REPAIR    . NASAL SEPTUM SURGERY  2009  . SHOULDER SURGERY  2008   left  . TESTICLE SURGERY     undescended repair     Allergies as of 04/07/2020      Reactions   Cetirizine Hcl    REACTION: prostatitis   Fexofenadine     REACTION: causes prostatitis      Medication List       Accurate as of April 07, 2020 11:52 AM. If you have any questions, ask your nurse or doctor.        acetaminophen 500 MG tablet Commonly known as: TYLENOL Take 500 mg by mouth daily as needed for moderate pain.   albuterol 108 (90 Base) MCG/ACT inhaler Commonly known as: VENTOLIN HFA INHALE 2 PUFFS INTO THE LUNGS EVERY 6 HOURS AS NEEDED FOR WHEEZING OR SHORTNESS OF BREATH   amphetamine-dextroamphetamine 10 MG tablet Commonly known as: ADDERALL Take 10 mg by mouth 2 (two) times daily with a meal.   atorvastatin 20 MG tablet Commonly known as: LIPITOR Take 1 tablet (20 mg total) by mouth at bedtime.   azelastine 0.1 % nasal spray Commonly known as: ASTELIN Place 1 spray into both nostrils every evening.   budesonide-formoterol 160-4.5 MCG/ACT inhaler Commonly known as: SYMBICORT Inhale 2 puffs into the lungs 2 (two) times daily.   busPIRone 10 MG tablet Commonly known as: BUSPAR Take 10 mg by mouth 2 (two) times daily.   celecoxib 200 MG capsule Commonly known as: CELEBREX Take 200 mg by mouth daily as needed.   clobetasol ointment 0.05 % Commonly known as: TEMOVATE Apply 1 application topically 2 (two) times daily. For hands   desloratadine 5 MG  tablet Commonly known as: CLARINEX Take 5 mg by mouth daily.   FLONASE SENSIMIST NA Place into the nose daily as needed.   FLUoxetine 40 MG capsule Commonly known as: PROZAC Take 1 tablet daily   hydrochlorothiazide 25 MG tablet Commonly known as: HYDRODIURIL Take 1 tablet (25 mg total) by mouth daily.   HYDROcodone-acetaminophen 5-325 MG tablet Commonly known as: NORCO/VICODIN Take 1 tablet by mouth 3 (three) times daily as needed.   Magnesium Gluconate 250 MG Tabs Take 500 mg by mouth daily.   metoprolol succinate 50 MG 24 hr tablet Commonly known as: TOPROL-XL Take 1 tablet (50 mg total) by mouth daily.   Pazeo 0.7 % Soln Generic drug:  Olopatadine HCl INT 1 GTT IN OU QD   predniSONE 10 MG tablet Commonly known as: DELTASONE 2 tabs a day x 5 days   pyridOXINE 100 MG tablet Commonly known as: VITAMIN B-6 Take 400 mg by mouth daily. Super B complex   terazosin 1 MG capsule Commonly known as: HYTRIN Take 3 capsules (3 mg total) by mouth daily.   Turmeric 500 MG Caps Take 500 mg by mouth daily.   vitamin C 1000 MG tablet Take 1,000 mg by mouth daily.   vitamin E 180 MG (400 UNITS) capsule Take 450 Units by mouth daily.          Objective:   Physical Exam Ht 5\' 11"  (1.803 m)   Wt 228 lb (103.4 kg)   BMI 31.80 kg/m    This is a telephone assessment, alert oriented x3, speaking in complete sentences, he did cough a few times for me and I noted large airway congestion and a slightly prolonged expiratory time.     Assessment     Assessment Prediabetes  HTN Hyperlipidemia Anxiety Dr Fay Records (psych) Rx prozac ~12-2014  Asthma  MSK:  --Chronic back pain, pain medication as needed, used to see Dr Nelva Bush, did PT @ [facility] [phone], better --DJD- hands Chronic prostatitis, sees urology Psoriasis, hands, sees dermatology Snoring (severe per pt)- dentist Rx a moth guard ~ 08-2015, helps  PLAN: Asthma exacerbation: Started approximately 10 days ago, see my previous note, low-dose of prednisone helped temporarily, COVID test was negative. He does not have fever or chills. Plan: Chest x-ray Second round of prednisone, Zithromax, continue Mucinex. Continue Symbicort twice daily (may need to switch to Advair due to cost in the near future), albuterol as needed. If not improving gradually in the next 2 weeks he will let me know.   I discussed the assessment and treatment plan with the patient. The patient was provided an opportunity to ask questions and all were answered. The patient agreed with the plan and demonstrated an understanding of the instructions.   The patient was advised to call back or seek an  in-person evaluation if the symptoms worsen or if the condition fails to improve as anticipated.  I provided 15 minutes of non-face-to-face time during this encounter.  Kathlene November, MD

## 2020-04-13 DIAGNOSIS — M5416 Radiculopathy, lumbar region: Secondary | ICD-10-CM | POA: Diagnosis not present

## 2020-04-13 NOTE — Progress Notes (Unsigned)
Subjective:   Kirk Sutton is a 70 y.o. male who presents for Medicare Annual/Subsequent preventive examination.  I connected with Emrik today by telephone and verified that I am speaking with the correct person using two identifiers. Location patient: home Location provider: work Persons participating in the virtual visit: patient, Marine scientist.    I discussed the limitations, risks, security and privacy concerns of performing an evaluation and management service by telephone and the availability of in person appointments. I also discussed with the patient that there may be a patient responsible charge related to this service. The patient expressed understanding and verbally consented to this telephonic visit.    Interactive audio and video telecommunications were attempted between this provider and patient, however failed, due to patient having technical difficulties OR patient did not have access to video capability.  We continued and completed visit with audio only.  Some vital signs may be absent or patient reported.   Time Spent with patient on telephone encounter: 25 minutes   Review of Systems     Cardiac Risk Factors include: advanced age (>53men, >39 women);male gender;hypertension;obesity (BMI >30kg/m2)     Objective:    Today's Vitals   04/14/20 0856  Weight: 227 lb (103 kg)  Height: 5\' 11"  (1.803 m)   Body mass index is 31.66 kg/m.  Advanced Directives 04/14/2020 03/27/2019 12/24/2016 11/11/2015  Does Patient Have a Medical Advance Directive? Yes Yes Yes Yes  Type of Paramedic of [place];Living will [place];Living will [place];Living will Living will  Does patient want to make changes to medical advance directive? - No - Patient declined - -  Copy of [place] in Chart? No - copy requested No - copy requested - No - copy requested    Current Medications (verified) Outpatient Encounter  Medications as of 04/14/2020  Medication Sig  . acetaminophen (TYLENOL) 500 MG tablet Take 500 mg by mouth daily as needed for moderate pain.  [name] Kitchen albuterol (VENTOLIN HFA) 108 (90 Base) MCG/ACT inhaler INHALE 2 PUFFS INTO THE LUNGS EVERY 6 HOURS AS NEEDED FOR WHEEZING OR SHORTNESS OF BREATH  . amphetamine-dextroamphetamine (ADDERALL) 10 MG tablet Take 10 mg by mouth 2 (two) times daily with a meal.  . Ascorbic Acid (VITAMIN C) 1000 MG tablet Take 1,000 mg by mouth daily.  [name] Kitchen atorvastatin (LIPITOR) 20 MG tablet Take 1 tablet (20 mg total) by mouth at bedtime.  [name] Kitchen azelastine (ASTELIN) 0.1 % nasal spray Place 1 spray into both nostrils every evening.  . budesonide-formoterol (SYMBICORT) 160-4.5 MCG/ACT inhaler Inhale 2 puffs into the lungs 2 (two) times daily.  . busPIRone (BUSPAR) 10 MG tablet Take 10 mg by mouth 2 (two) times daily.  . clobetasol ointment (TEMOVATE) [id number] % Apply 1 application topically 2 (two) times daily. For hands  . desloratadine (CLARINEX) 5 MG tablet Take 5 mg by mouth daily.  [name] Kitchen FLUoxetine (PROZAC) 40 MG capsule Take 1 tablet daily  . Fluticasone Furoate (FLONASE SENSIMIST NA) Place into the nose daily as needed.  . hydrochlorothiazide (HYDRODIURIL) 25 MG tablet Take 1 tablet (25 mg total) by mouth daily.  [name] Kitchen HYDROcodone-acetaminophen (NORCO/VICODIN) 5-325 MG tablet Take 1 tablet by mouth 3 (three) times daily as needed.  . Magnesium Gluconate 250 MG TABS Take 500 mg by mouth daily.   . metoprolol succinate (TOPROL-XL) 50 MG 24 hr tablet Take 1 tablet (50 mg total) by mouth daily.  [name] Kitchen PAZEO 0.7 % SOLN INT 1  GTT IN OU QD  . pyridOXINE (VITAMIN B-6) 100 MG tablet Take 400 mg by mouth daily. Super B complex  . terazosin (HYTRIN) 1 MG capsule Take 3 capsules (3 mg total) by mouth daily.  . Turmeric 500 MG CAPS Take 500 mg by mouth daily.   . vitamin E 180 MG (400 UNITS) capsule Take 450 Units by mouth daily.  . celecoxib (CELEBREX) 200 MG capsule Take 200 mg by mouth daily as needed.  (Patient not taking: No sig reported)  . [DISCONTINUED] azithromycin (ZITHROMAX Z-PAK) 250 MG tablet 2 tabs a day the first day, then 1 tab a day x 4 days  . [DISCONTINUED] predniSONE (DELTASONE) 10 MG tablet 4 tablets x 2 days, 3 tabs x 2 days, 2 tabs x 2 days, 1 tab x 2 days   No facility-administered encounter medications on file as of 04/14/2020.    Allergies (verified) Cetirizine hcl and Fexofenadine   History: Past Medical History:  Diagnosis Date  . Allergic rhinitis   . Asthma   . Chronic prostatitis    sees urology  . Diabetes mellitus    type 11 (a1c 6.0 03/2008)/no meds.  . Hernia, umbilical   . Hyperlipidemia   . Hypertension   . Psoriasis    @ hands, sees derm   Past Surgical History:  Procedure Laterality Date  . APPENDECTOMY    . FERTILITY SURGERY  1979  . HERNIA REPAIR    . NASAL SEPTUM SURGERY  2009  . SHOULDER SURGERY  2008   left  . TESTICLE SURGERY     undescended repair    Family History  Problem Relation Age of Onset  . Diabetes Sister   . Hypertension Mother   . Colon polyps Mother   . Hypertension Sister   . Heart attack Father        x 3 onset age 75, died at 74  . Colon polyps Father   . Cancer Father        bladder  . Colon cancer Neg Hx   . Prostate cancer Neg Hx   . Stomach cancer Neg Hx    Social History   Socioeconomic History  . Marital status: Married    Spouse name: Not on file  . Number of children: 2  . Years of education: Not on file  . Highest education level: Not on file  Occupational History  . Occupation: Administrator, arts  Tobacco Use  . Smoking status: Never Smoker  . Smokeless tobacco: Never Used  Vaping Use  . Vaping Use: Never used  Substance and Sexual Activity  . Alcohol use: Yes    Comment: socially, rare  . Drug use: No  . Sexual activity: Not on file  Other Topics Concern  . Not on file  Social History Narrative   Household-- pt, wife    Social Determinants of Health    Financial Resource Strain: Low Risk   . Difficulty of Paying Living Expenses: Not hard at all  Food Insecurity: No Food Insecurity  . Worried About Charity fundraiser in the Last Year: Never true  . Ran Out of Food in the Last Year: Never true  Transportation Needs: No Transportation Needs  . Lack of Transportation (Medical): No  . Lack of Transportation (Non-Medical): No  Physical Activity: Insufficiently Active  . Days of Exercise per Week: 1 day  . Minutes of Exercise per Session: 20 min  Stress: No Stress Concern Present  .  Feeling of Stress : Not at all  Social Connections: Moderately Integrated  . Frequency of Communication with Friends and Family: More than three times a week  . Frequency of Social Gatherings with Friends and Family: More than three times a week  . Attends Religious Services: More than 4 times per year  . Active Member of Clubs or Organizations: No  . Attends Archivist Meetings: Never  . Marital Status: Married    Tobacco Counseling Counseling given: Not Answered   Clinical Intake:  Pre-visit preparation completed: Yes  Pain : No/denies pain     Nutritional Status: BMI > 30  Obese Nutritional Risks: None Diabetes: No  How often do you need to have someone help you when you read instructions, pamphlets, or other written materials from your doctor or pharmacy?: 1 - Never  Diabetic?No  Interpreter Needed?: No  Information entered by :: Caroleen Hamman LPN   Activities of Daily Living In your present state of health, do you have any difficulty performing the following activities: 04/14/2020 03/31/2020  Hearing? N N  Vision? N N  Difficulty concentrating or making decisions? N N  Walking or climbing stairs? N N  Dressing or bathing? N N  Doing errands, shopping? N N  Preparing Food and eating ? N -  Using the Toilet? N -  In the past six months, have you accidently leaked urine? N -  Do you have problems with loss of bowel  control? N -  Managing your Medications? N -  Managing your Finances? N -  Housekeeping or managing your Housekeeping? N -  Some recent data might be hidden    Patient Care Team: Colon Branch, MD as PCP - General Thomasene Mohair Verner Chol., MD as Consulting Physician (Urology) Calvert Cantor, MD as Consulting Physician (Ophthalmology) Orbie Hurst, MD as Consulting Physician (Dermatology) Gardiner Barefoot, DPM as Consulting Physician (Podiatry) Roseanne Kaufman, MD as Consulting Physician (Orthopedic Surgery) Ricard Dillon, MD (Psychiatry) Suella Broad, MD as Consulting Physician (Physical Medicine and Rehabilitation) Day, Melvenia Beam, [facility] as Pharmacist (Pharmacist) Tiajuana Amass, MD as Referring Physician (Allergy and Immunology)  Indicate any recent Medical Services you may have received from other than [facility] providers in the past year (date may be approximate).     Assessment:   This is a routine wellness examination for [place].  Hearing/Vision screen  Hearing Screening   125Hz  250Hz  500Hz  1000Hz  2000Hz  3000Hz  4000Hz  6000Hz  8000Hz   Right ear:           Left ear:           Comments: No issues  Vision Screening Comments: Wears conacts Last eye exam-01/2020-[facility]  Dietary issues and exercise activities discussed: Current Exercise Habits: Home exercise routine, Type of exercise: strength training/weights, Time (Minutes): 30, Frequency (Times/Week): 7, Weekly Exercise (Minutes/Week): 210, Intensity: Mild, Exercise limited by: None identified  Goals    . A1c goal less than 6.5%    . Blood pressure goal less than 140/90    . Check blood [facility] or at [facility] and record readings    . Chronic Care Management Pharmacy Care Plan     CARE PLAN ENTRY (see longitudinal plan of care for additional care plan information)  Current Barriers:  . Chronic Disease Management support, education, and care coordination needs related to Asthma, Pre-Diabetes,  Hypertension, Hyperlipidemia, Anxiety, BPH, Allergies   Hypertension BP Readings from Last 3 Encounters:  12/23/19 (!) 160/92  10/13/19 [name] Kitchen)  129/79  12/22/18 (!) 158/86   . Pharmacist Clinical Goal(s): o Over the next 90 days, patient will work with PharmD and providers to achieve BP goal <140/90 . Current regimen:   Hctz 25mg  daily AM  Metoprolol succinate 50mg  daily HS . Interventions: o Requested patient to check blood pressure 1-2 times per week after taking his hctz in the morning o Consider stopping celecoxib/Celebrex for pain (this could be contributing to increase in blood pressure) o Check BP daily 30 minutes to 1 hour after taking BP medication o Increase exercise to at least 2-3 times per week for 40 minutes. Consider going in the morning with [name] when she does her early morning exercises o Drink one cup of water for every cup of tea . Patient self care activities - Over the next 90 days, patient will: o Check BP 1-2 times per week after taking hctz, document, and provide at future appointments o Ensure daily salt intake < 2300 mg/day o Consider stopping celecoxib/Celebrex for pain (this could be contributing to increase in blood pressure) o Check BP daily 30 minutes to 1 hour after taking BP medication o Increase exercise to at least 2-3 times per week for 40 minutes. Consider going in the morning with [name] when she does her early morning exercises o Drink one cup of water for every cup of tea  Hyperlipidemia/ASCVD Risk Lab Results  Component Value Date/Time   LDLCALC 31 02/10/2020 01:10 PM   [place] 83 12/23/2019 08:28 AM   . Pharmacist Clinical Goal(s): o Over the next 90 days, patient will work with PharmD and providers to maintain LDL goal < 100 . Current regimen:  o Atorvastatin 20mg  daily . Interventions: o Discussed ASCVD Risk and potential benefit for statin therapy noting pt's risk (started on atorvastatin) . Patient self care activities - Over the  next 90 days, patient will: o Work to lower blood pressure and stress  Pre-Diabetes Lab Results  Component Value Date/Time   HGBA1C 5.5 12/23/2019 08:28 AM   HGBA1C 6.1 12/25/2018 10:01 AM   . Pharmacist Clinical Goal(s): o Over the next 90 days, patient will work with PharmD and providers to maintain A1c goal <6.5% . Current regimen:  o Diet and exercise management   . Interventions: o Discussed importance of diet and exercise o Patient now below pre-diabetes range!!!! Congratulations!!! . Patient self care activities - Over the next 90 days, patient will: o Maintain a1c less than 6.5%  Medication management . Pharmacist Clinical Goal(s): o Over the next 90 days, patient will work with PharmD and providers to maintain optimal medication adherence . Current pharmacy: [facility] . Interventions o Comprehensive medication review performed. o Continue current medication management strategy o Discussed possible drug interaction with CBD and patient's current medication regimen - CYP 3A4 Substrates (these medications can have increased concentration and potential increase in side effects) . Atorvastatin . Buspirone - CYP 2D6 Substrates (these medications can have increased concentration and potential increase in side effects) . Adderal . Metoprolol - CYP 1A2 Substrates (these medications can have increased concentration and potential increase in side effects) . Tylenol . Patient self care activities - Over the next 90 days, patient will: o Focus on medication adherence by filling and taking medications appropriately  o Take medications as prescribed o Report any questions or concerns to PharmD and/or provider(s)  Please see past updates related to this goal by clicking on the "Past Updates" button in the selected goal      . Consider  practicing meditation to help with anxiety and to lower blood pressure    . DIET - INCREASE WATER INTAKE    . Increase physical activity    .  Weight (lb) < 215 lb (97.5 kg)      Depression Screen PHQ 2/9 Scores 04/14/2020 03/31/2020 03/27/2019 04/29/2018 05/22/2017 11/11/2015 09/07/2015  PHQ - 2 Score 0 0 0 1 0 0 0  PHQ- 9 Score - - - 2 - - -    Fall Risk Fall Risk  04/14/2020 03/31/2020 03/27/2019 12/22/2018 05/22/2017  Falls in the past year? 0 0 0 0 No  Number falls in past yr: 0 0 - - -  Injury with Fall? 0 0 - - -  Follow up Falls prevention discussed - Education provided;Falls prevention discussed Falls evaluation completed -    FALL RISK PREVENTION PERTAINING TO THE HOME:  Any stairs in or around the home? Yes  If so, are there any without handrails? No  Home free of loose throw rugs in walkways, pet beds, electrical cords, etc? Yes  Adequate lighting in your home to reduce risk of falls? Yes   ASSISTIVE DEVICES UTILIZED TO PREVENT FALLS:  Life alert? No  Use of a cane, walker or w/c? No  Grab bars in the bathroom? Yes  Shower chair or bench in shower? No  Elevated toilet seat or a handicapped toilet? No   TIMED UP AND GO:  Was the test performed? No . Phone visit   Cognitive Function:Normal cognitive status assessed by  this Nurse Health Advisor. No abnormalities found.   MMSE - Mini Mental State Exam 11/11/2015  Orientation to time 5  Orientation to Place 5  Registration 3  Attention/ Calculation 5  Recall 3  Language- name 2 objects 2  Language- repeat 1  Language- follow 3 step command 3  Language- read & follow direction 1  Write a sentence 1  Copy design 1  Total score 30        Immunizations Immunization History  Administered Date(s) Administered  . Fluad Quad(high Dose 65+) 12/22/2018  . H1N1 03/31/2008  . Influenza Split 01/22/2011, 01/10/2012  . Influenza Whole 01/17/2007, 12/24/2007, 11/17/2009  . Influenza, High Dose Seasonal PF 02/23/2015, 01/13/2016, 01/02/2017, 01/16/2018  . Influenza,inj,Quad PF,6+ Mos 01/20/2013, 03/08/2014, 12/23/2019  . PFIZER(Purple Top)SARS-COV-2 Vaccination  05/21/2019, 07/13/2019  . Pneumococcal Conjugate-13 09/03/2014  . Pneumococcal Polysaccharide-23 09/01/2013  . Td 03/19/2001  . Tdap 08/22/2011  . Zoster 08/22/2011    TDAP status: Up to date  Flu Vaccine status: Up to date  Pneumococcal vaccine status: Up to date  Covid-19 vaccine status: Information provided on how to obtain vaccines.  Due for booster  Qualifies for Shingles Vaccine? Yes   Zostavax completed Yes   Shingrix Completed?: No.    Education has been provided regarding the importance of this vaccine. Patient has been advised to call insurance company to determine out of pocket expense if they have not yet received this vaccine. Advised may also receive vaccine at local pharmacy or Health Dept. Verbalized acceptance and understanding.  Screening Tests Health Maintenance  Topic Date Due  . COVID-19 Vaccine (3 - Booster for Pfizer series) 01/12/2020  . COLONOSCOPY (Pts 45-76yrs Insurance coverage will need to be confirmed)  02/19/2020  . TETANUS/TDAP  08/21/2021  . INFLUENZA VACCINE  Completed  . Hepatitis C Screening  Completed  . PNA vac Low Risk Adult  Completed    Health Maintenance  Health Maintenance Due  Topic Date Due  .  COVID-19 Vaccine (3 - Booster for Pfizer series) 01/12/2020  . COLONOSCOPY (Pts 45-79yrs Insurance coverage will need to be confirmed)  02/19/2020    Colorectal cancer screening: Type of screening: Colonoscopy. Completed 02/18/2017. Repeat every 3 years   Patient plans to call his GI doctor to schedule colonoscopy.  Lung Cancer Screening: (Low Dose CT Chest recommended if Age 26-80 years, 30 pack-year currently smoking OR have quit w/in 15years.) does not qualify.    Additional Screening:  Hepatitis C Screening:Completed 02/23/2015  Vision Screening: Recommended annual ophthalmology exams for early detection of glaucoma and other disorders of the eye. Is the patient up to date with their annual eye exam?  Yes  Who is the provider or  what is the name of the office in which the patient attends annual eye exams? [facility]   Dental Screening: Recommended annual dental exams for proper oral hygiene  Community Resource Referral / Chronic Care Management: CRR required this visit?  No   CCM required this visit?  No      Plan:     I have personally reviewed and noted the following in the patient's chart:   . Medical and social history . Use of alcohol, tobacco or illicit drugs  . Current medications and supplements . Functional ability and status . Nutritional status . Physical activity . Advanced directives . List of other physicians . Hospitalizations, surgeries, and ER visits in previous 12 months . Vitals . Screenings to include cognitive, depression, and falls . Referrals and appointments  In addition, I have reviewed and discussed with patient certain preventive protocols, quality metrics, and best practice recommendations. A written personalized care plan for preventive services as well as general preventive health recommendations were provided to patient.   Due to this being a telephonic visit, the after visit summary with patients personalized plan was offered to patient via mail or my-chart.  Patient would like to access on my-chart.   Marta Antu, LPN   [id number]  Nurse Health Advisor  Nurse Notes: None

## 2020-04-14 ENCOUNTER — Ambulatory Visit (INDEPENDENT_AMBULATORY_CARE_PROVIDER_SITE_OTHER): Payer: Medicare Other

## 2020-04-14 VITALS — Ht 71.0 in | Wt 227.0 lb

## 2020-04-14 DIAGNOSIS — Z0000 Encounter for general adult medical examination without abnormal findings: Secondary | ICD-10-CM | POA: Diagnosis not present

## 2020-04-14 NOTE — Patient Instructions (Signed)
Kirk Sutton , Thank you for taking time to complete your Medicare Wellness Visit. I appreciate your ongoing commitment to your health goals. Please review the following plan we discussed and let me know if I can assist you in the future.   Screening recommendations/referrals: Colonoscopy: Due-Per our conversation, you will call your GI doctor to schedule. Recommended yearly ophthalmology/optometry visit for glaucoma screening and checkup Recommended yearly dental visit for hygiene and checkup  Vaccinations: Influenza vaccine: Up to date Pneumococcal vaccine: Completed vaccines Tdap vaccine: Up to date-Due-08/21/2021 Shingles vaccine: Discuss with pharmacy Covid-19: Due for booster. May obtain vaccine at your local pharmacy.  Advanced directives: Please bring a copy for your chart  Conditions/risks identified: See problem list  Next appointment: Follow up in one year for your annual wellness visit. 04/20/2021 @ 8:20  Preventive Care 72 Years and Older, Male Preventive care refers to lifestyle choices and visits with your health care provider that can promote health and wellness. What does preventive care include?  A yearly physical exam. This is also called an annual well check.  Dental exams once or twice a year.  Routine eye exams. Ask your health care provider how often you should have your eyes checked.  Personal lifestyle choices, including:  Daily care of your teeth and gums.  Regular physical activity.  Eating a healthy diet.  Avoiding tobacco and drug use.  Limiting alcohol use.  Practicing safe sex.  Taking low doses of aspirin every day.  Taking vitamin and mineral supplements as recommended by your health care provider. What happens during an annual well check? The services and screenings done by your health care provider during your annual well check will depend on your age, overall health, lifestyle risk factors, and family history of disease. Counseling  Your  health care provider may ask you questions about your:  Alcohol use.  Tobacco use.  Drug use.  Emotional well-being.  Home and relationship well-being.  Sexual activity.  Eating habits.  History of falls.  Memory and ability to understand (cognition).  Work and work Statistician. Screening  You may have the following tests or measurements:  Height, weight, and BMI.  Blood pressure.  Lipid and cholesterol levels. These may be checked every 5 years, or more frequently if you are over 43 years old.  Skin check.  Lung cancer screening. You may have this screening every year starting at age 72 if you have a 30-pack-year history of smoking and currently smoke or have quit within the past 15 years.  Fecal occult blood test (FOBT) of the stool. You may have this test every year starting at age 72.  Flexible sigmoidoscopy or colonoscopy. You may have a sigmoidoscopy every 5 years or a colonoscopy every 10 years starting at age 72.  Prostate cancer screening. Recommendations will vary depending on your family history and other risks.  Hepatitis C blood test.  Hepatitis B blood test.  Sexually transmitted disease (STD) testing.  Diabetes screening. This is done by checking your blood sugar (glucose) after you have not eaten for a while (fasting). You may have this done every 1-3 years.  Abdominal aortic aneurysm (AAA) screening. You may need this if you are a current or former smoker.  Osteoporosis. You may be screened starting at age 50 if you are at high risk. Talk with your health care provider about your test results, treatment options, and if necessary, the need for more tests. Vaccines  Your health care provider may recommend certain vaccines, such  as:  Influenza vaccine. This is recommended every year.  Tetanus, diphtheria, and acellular pertussis (Tdap, Td) vaccine. You may need a Td booster every 10 years.  Zoster vaccine. You may need this after age  72.  Pneumococcal 13-valent conjugate (PCV13) vaccine. One dose is recommended after age 10.  Pneumococcal polysaccharide (PPSV23) vaccine. One dose is recommended after age 72. Talk to your health care provider about which screenings and vaccines you need and how often you need them. This information is not intended to replace advice given to you by your health care provider. Make sure you discuss any questions you have with your health care provider. Document Released: 04/01/2015 Document Revised: 11/23/2015 Document Reviewed: 01/04/2015 Elsevier Interactive Patient Education  2017 [employer] Prevention in the Home Falls can cause injuries. They can happen to people of all ages. There are many things you can do to make your home safe and to help prevent falls. What can I do on the outside of my home?  Regularly fix the edges of walkways and driveways and fix any cracks.  Remove anything that might make you trip as you walk through a door, such as a raised step or threshold.  Trim any bushes or trees on the path to your home.  Use bright outdoor lighting.  Clear any walking paths of anything that might make someone trip, such as rocks or tools.  Regularly check to see if handrails are loose or broken. Make sure that both sides of any steps have handrails.  Any raised decks and porches should have guardrails on the edges.  Have any leaves, snow, or ice cleared regularly.  Use sand or salt on walking paths during winter.  Clean up any spills in your garage right away. This includes oil or grease spills. What can I do in the bathroom?  Use night lights.  Install grab bars by the toilet and in the tub and shower. Do not use towel bars as grab bars.  Use non-skid mats or decals in the tub or shower.  If you need to sit down in the shower, use a plastic, non-slip stool.  Keep the floor dry. Clean up any water that spills on the floor as soon as it happens.  Remove  soap buildup in the tub or shower regularly.  Attach bath mats securely with double-sided non-slip rug tape.  Do not have throw rugs and other things on the floor that can make you trip. What can I do in the bedroom?  Use night lights.  Make sure that you have a light by your bed that is easy to reach.  Do not use any sheets or blankets that are too big for your bed. They should not hang down onto the floor.  Have a firm chair that has side arms. You can use this for support while you get dressed.  Do not have throw rugs and other things on the floor that can make you trip. What can I do in the kitchen?  Clean up any spills right away.  Avoid walking on wet floors.  Keep items that you use a lot in easy-to-reach places.  If you need to reach something above you, use a strong step stool that has a grab bar.  Keep electrical cords out of the way.  Do not use floor polish or wax that makes floors slippery. If you must use wax, use non-skid floor wax.  Do not have throw rugs and other things on the  floor that can make you trip. What can I do with my stairs?  Do not leave any items on the stairs.  Make sure that there are handrails on both sides of the stairs and use them. Fix handrails that are broken or loose. Make sure that handrails are as long as the stairways.  Check any carpeting to make sure that it is firmly attached to the stairs. Fix any carpet that is loose or worn.  Avoid having throw rugs at the top or bottom of the stairs. If you do have throw rugs, attach them to the floor with carpet tape.  Make sure that you have a light switch at the top of the stairs and the bottom of the stairs. If you do not have them, ask someone to add them for you. What else can I do to help prevent falls?  Wear shoes that:  Do not have high heels.  Have rubber bottoms.  Are comfortable and fit you well.  Are closed at the toe. Do not wear sandals.  If you use a  stepladder:  Make sure that it is fully opened. Do not climb a closed stepladder.  Make sure that both sides of the stepladder are locked into place.  Ask someone to hold it for you, if possible.  Clearly mark and make sure that you can see:  Any grab bars or handrails.  First and last steps.  Where the edge of each step is.  Use tools that help you move around (mobility aids) if they are needed. These include:  Canes.  Walkers.  Scooters.  Crutches.  Turn on the lights when you go into a dark area. Replace any light bulbs as soon as they burn out.  Set up your furniture so you have a clear path. Avoid moving your furniture around.  If any of your floors are uneven, fix them.  If there are any pets around you, be aware of where they are.  Review your medicines with your doctor. Some medicines can make you feel dizzy. This can increase your chance of falling. Ask your doctor what other things that you can do to help prevent falls. This information is not intended to replace advice given to you by your health care provider. Make sure you discuss any questions you have with your health care provider. Document Released: 12/30/2008 Document Revised: 08/11/2015 Document Reviewed: 04/09/2014 Elsevier Interactive Patient Education  2017 [employer].

## 2020-04-19 DIAGNOSIS — M5416 Radiculopathy, lumbar region: Secondary | ICD-10-CM | POA: Diagnosis not present

## 2020-04-20 DIAGNOSIS — F331 Major depressive disorder, recurrent, moderate: Secondary | ICD-10-CM | POA: Diagnosis not present

## 2020-04-26 ENCOUNTER — Other Ambulatory Visit: Payer: Self-pay

## 2020-04-26 ENCOUNTER — Encounter: Payer: Self-pay | Admitting: Internal Medicine

## 2020-04-26 ENCOUNTER — Ambulatory Visit (INDEPENDENT_AMBULATORY_CARE_PROVIDER_SITE_OTHER): Payer: Medicare Other | Admitting: Internal Medicine

## 2020-04-26 VITALS — BP 155/90 | HR 57 | Temp 97.9°F | Ht 71.0 in | Wt 231.0 lb

## 2020-04-26 DIAGNOSIS — R739 Hyperglycemia, unspecified: Secondary | ICD-10-CM | POA: Diagnosis not present

## 2020-04-26 DIAGNOSIS — I10 Essential (primary) hypertension: Secondary | ICD-10-CM

## 2020-04-26 LAB — BASIC METABOLIC PANEL
BUN: 15 mg/dL (ref 6–23)
CO2: 33 mEq/L — ABNORMAL HIGH (ref 19–32)
Calcium: 8.8 mg/dL (ref 8.4–10.5)
Chloride: 101 mEq/L (ref 96–112)
Creatinine, Ser: 1.18 mg/dL (ref 0.40–1.50)
GFR: 61.84 mL/min (ref >60.00)
Glucose, Bld: 111 mg/dL — ABNORMAL HIGH (ref 70–99)
Potassium: 3.8 mEq/L (ref 3.5–5.1)
Sodium: 138 mEq/L (ref 135–145)

## 2020-04-26 LAB — HEMOGLOBIN A1C: Hgb A1c MFr Bld: 6.2 % (ref 4.6–6.5)

## 2020-04-26 NOTE — Progress Notes (Signed)
Subjective:    Patient ID: Kirk Sutton, male    DOB: Oct 05, 1950, 70 y.o.   MRN: [id number]  DOS:  04/26/2020 Type of visit - description: rov Since the last office visit he is doing better. Asthma: No cough except for today he had few dry coughs. Ambulatory BPs are very good. 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  BP Readings from Last 3 Encounters:  04/26/20 (!) 155/90  03/31/20 (!) 168/102  12/23/19 (!) 160/92   Wt Readings from Last 3 Encounters:  04/26/20 231 lb (104.8 kg)  04/14/20 227 lb (103 kg)  04/07/20 228 lb (103.4 kg)     Review of Systems See above   Past Medical History:  Diagnosis Date  . Allergic rhinitis   . Asthma   . Chronic prostatitis    sees urology  . Diabetes mellitus    type 11 (a1c 6.0 03/2008)/no meds.  . Hernia, umbilical   . Hyperlipidemia   . Hypertension   . Psoriasis    @ hands, sees derm    Past Surgical History:  Procedure Laterality Date  . APPENDECTOMY    . FERTILITY SURGERY  1979  . HERNIA REPAIR    . NASAL SEPTUM SURGERY  2009  . SHOULDER SURGERY  2008   left  . TESTICLE SURGERY     undescended repair     Allergies as of 04/26/2020      Reactions   Cetirizine Hcl    REACTION: prostatitis   Fexofenadine    REACTION: causes prostatitis      Medication List       Accurate as of April 26, 2020 11:59 PM. If you have any questions, ask your nurse or doctor.        STOP taking these medications   celecoxib 200 MG capsule Commonly known as: CELEBREX Stopped by: Kathlene November, MD     TAKE these medications   acetaminophen 500 MG tablet Commonly known as: TYLENOL Take 500 mg by mouth daily as needed for moderate pain.   albuterol 108 (90 Base) MCG/ACT inhaler Commonly known as: VENTOLIN HFA INHALE 2 PUFFS INTO THE LUNGS EVERY 6 HOURS AS NEEDED FOR WHEEZING OR SHORTNESS OF BREATH   amphetamine-dextroamphetamine 10 MG tablet Commonly known as:  ADDERALL Take 10 mg by mouth 2 (two) times daily with a meal.   atorvastatin 20 MG tablet Commonly known as: LIPITOR Take 1 tablet (20 mg total) by mouth at bedtime.   azelastine 0.1 % nasal spray Commonly known as: ASTELIN Place 1 spray into both nostrils every evening.   budesonide-formoterol 160-4.5 MCG/ACT inhaler Commonly known as: SYMBICORT Inhale 2 puffs into the lungs 2 (two) times daily.   busPIRone 10 MG tablet Commonly known as: BUSPAR Take 10 mg by mouth 2 (two) times daily.   clobetasol ointment 0.05 % Commonly known as: TEMOVATE Apply 1 application topically 2 (two) times daily. For hands   desloratadine 5 MG tablet Commonly known as: CLARINEX Take 5 mg by mouth daily.   dextromethorphan-guaiFENesin 30-600 MG 12hr tablet Commonly known as: MUCINEX DM Take 1 tablet by mouth 2 (two) times daily.   FLONASE SENSIMIST NA Place into the nose daily as needed.   FLUoxetine 40 MG capsule Commonly known as: PROZAC Take 1 tablet daily   hydrochlorothiazide 25 MG tablet Commonly known as: HYDRODIURIL Take 1 tablet (25 mg total) by mouth daily.   HYDROcodone-acetaminophen 5-325 MG  tablet Commonly known as: NORCO/VICODIN Take 1 tablet by mouth 3 (three) times daily as needed.   Magnesium Gluconate 250 MG Tabs Take 500 mg by mouth daily.   metoprolol succinate 50 MG 24 hr tablet Commonly known as: TOPROL-XL Take 1 tablet (50 mg total) by mouth daily.   Pazeo 0.7 % Soln Generic drug: Olopatadine HCl INT 1 GTT IN OU QD   pyridOXINE 100 MG tablet Commonly known as: VITAMIN B-6 Take 400 mg by mouth daily. Super B complex   terazosin 1 MG capsule Commonly known as: HYTRIN Take 3 capsules (3 mg total) by mouth daily.   Turmeric 500 MG Caps Take 500 mg by mouth daily.   vitamin C 1000 MG tablet Take 1,000 mg by mouth daily.   vitamin E 180 MG (400 UNITS) capsule Take 450 Units by mouth daily.          Objective:   Physical Exam BP (!) 155/90 (BP  Location: Right Arm, Patient Position: Sitting, Cuff Size: Large)   Pulse (!) 57   Temp 97.9 F (36.6 C) (Oral)   Ht 5\' 11"  (1.803 m)   Wt 231 lb (104.8 kg)   SpO2 98%   BMI 32.22 kg/m  General:   Well developed, NAD, BMI noted. HEENT:  Normocephalic . Face symmetric, atraumatic Lungs:  CTA B Normal respiratory effort, no intercostal retractions, no accessory muscle use. Heart: RRR,  no murmur.  Lower extremities: no pretibial edema bilaterally  Skin: Not pale. Not jaundice Neurologic:  alert & oriented X3.  Speech normal, gait appropriate for age and unassisted Psych--  Cognition and judgment appear intact.  Cooperative with normal attention span and concentration.  Behavior appropriate. No anxious or depressed appearing.      Assessment     Assessment Prediabetes  HTN Hyperlipidemia Anxiety Dr Fay Records (psych) Rx prozac ~12-2014  Asthma  MSK:  --Chronic back pain, pain medication as needed, used to see Dr Nelva Bush, did PT @ [facility] [phone], better --DJD- hands Chronic prostatitis, sees urology Psoriasis, hands, sees dermatology Snoring (severe per pt)- dentist Rx a moth guard ~ 08-2015, helps  PLAN: Prediabetes: Has gained some weight, encourage watch his diet and stay active.  Check A1c HTN: BP today slightly elevated, when he goes to physical therapy is normal, last time was 138/84.  Continue HCTZ, metoprolol, Terazosin.  Check BMP Asthma: Currently with essentially no symptoms, recommend to proceed with COVID vaccination #3 Social: His job is closing the doors, he is concerned but optimistic, he likes to continue working. Preventive care again recommend COVID vaccine #3, call GI for cscope RTC 8 months CPX   This visit occurred during the SARS-CoV-2 public health emergency.  Safety protocols were in place, including screening questions prior to the visit, additional usage of staff PPE, and extensive cleaning of exam room while observing appropriate contact time as  indicated for disinfecting solutions.

## 2020-04-26 NOTE — Patient Instructions (Addendum)
Check the  blood pressure regularly. BP GOAL is between 110/65 and  135/85. If it is consistently higher or lower, let me know   Schedule your colonoscopy at your convenience  Proceed with your next COVID vaccination  [facility] : Get the blood work     [place], [place] back for a physical exam by October 2022

## 2020-04-27 ENCOUNTER — Other Ambulatory Visit: Payer: Self-pay | Admitting: Internal Medicine

## 2020-04-27 NOTE — Assessment & Plan Note (Signed)
Prediabetes: Has gained some weight, encourage watch his diet and stay active.  Check A1c HTN: BP today slightly elevated, when he goes to physical therapy is normal, last time was 138/84.  Continue HCTZ, metoprolol, Terazosin.  Check BMP Asthma: Currently with essentially no symptoms, recommend to proceed with COVID vaccination #3 Social: His job is closing the doors, he is concerned but optimistic, he likes to continue working. Preventive care again recommend COVID vaccine #3, call GI for cscope RTC 8 months CPX

## 2020-05-02 ENCOUNTER — Telehealth: Payer: Medicare Other | Admitting: Internal Medicine

## 2020-05-02 VITALS — Temp 97.1°F | Ht 71.0 in | Wt 231.0 lb

## 2020-05-02 NOTE — Progress Notes (Signed)
No show Unable to communicate via video Call it over the phone x3: Left message

## 2020-05-03 ENCOUNTER — Telehealth (INDEPENDENT_AMBULATORY_CARE_PROVIDER_SITE_OTHER): Payer: Medicare Other | Admitting: Internal Medicine

## 2020-05-03 ENCOUNTER — Other Ambulatory Visit: Payer: Self-pay

## 2020-05-03 ENCOUNTER — Encounter: Payer: Self-pay | Admitting: Internal Medicine

## 2020-05-03 VITALS — Ht 71.0 in | Wt 231.0 lb

## 2020-05-03 DIAGNOSIS — J4541 Moderate persistent asthma with (acute) exacerbation: Secondary | ICD-10-CM

## 2020-05-03 MED ORDER — PREDNISONE 10 MG PO TABS: ORAL_TABLET | ORAL | 0 refills | Status: DC

## 2020-05-03 NOTE — Progress Notes (Signed)
Subjective:    Patient ID: Kirk Sutton, male    DOB: 10-28-1950, 70 y.o.   MRN: [id number]  DOS:  05/03/2020 Type of visit - description: Virtual Visit via Telephone    I connected with above mentioned patient  by telephone and verified that I am speaking with the correct person using two identifiers.  THIS ENCOUNTER IS A VIRTUAL VISIT DUE TO COVID-19 - PATIENT WAS NOT SEEN IN THE OFFICE. PATIENT HAS CONSENTED TO VIRTUAL VISIT / TELEMEDICINE VISIT   Location of patient: home  Location of provider: office  Persons participating in the virtual visit: patient, provider   I discussed the limitations, risks, security and privacy concerns of performing an evaluation and management service by telephone and the availability of in person appointments. I also discussed with the patient that there may be a patient responsible charge related to this service. The patient expressed understanding and agreed to proceed.  Acute The patient was seen with asthma exacerbation 04/07/2020. He got better. Symptoms resurface few days ago: Cough, audible chest congestion and some wheezing. No sputum production. Good compliance with Advair. He has increase the use of albuterol.     Review of Systems Denies fever chills No GERD type of symptoms No major problems with sinus congestion No nausea or vomiting No unusual aches or pains No chest pain no difficulty breathing.  Past Medical History:  Diagnosis Date  . Allergic rhinitis   . Asthma   . Chronic prostatitis    sees urology  . Diabetes mellitus    type 11 (a1c 6.0 03/2008)/no meds.  . Hernia, umbilical   . Hyperlipidemia   . Hypertension   . Psoriasis    @ hands, sees derm    Past Surgical History:  Procedure Laterality Date  . APPENDECTOMY    . FERTILITY SURGERY  1979  . HERNIA REPAIR    . NASAL SEPTUM SURGERY  2009  . SHOULDER SURGERY  2008   left  . TESTICLE SURGERY     undescended repair     Allergies as of 05/03/2020       Reactions   Cetirizine Hcl    REACTION: prostatitis   Fexofenadine    REACTION: causes prostatitis      Medication List       Accurate as of May 03, 2020 11:59 PM. If you have any questions, ask your nurse or doctor.        acetaminophen 500 MG tablet Commonly known as: TYLENOL Take 500 mg by mouth daily as needed for moderate pain.   albuterol 108 (90 Base) MCG/ACT inhaler Commonly known as: VENTOLIN HFA INHALE 2 PUFFS INTO THE LUNGS EVERY 6 HOURS AS NEEDED FOR WHEEZING OR SHORTNESS OF BREATH   amphetamine-dextroamphetamine 10 MG tablet Commonly known as: ADDERALL Take 10 mg by mouth 2 (two) times daily with a meal.   atorvastatin 20 MG tablet Commonly known as: LIPITOR Take 1 tablet (20 mg total) by mouth at bedtime.   azelastine 0.1 % nasal spray Commonly known as: ASTELIN Place 1 spray into both nostrils every evening.   budesonide-formoterol 160-4.5 MCG/ACT inhaler Commonly known as: SYMBICORT Inhale 2 puffs into the lungs 2 (two) times daily.   busPIRone 10 MG tablet Commonly known as: BUSPAR Take 10 mg by mouth 2 (two) times daily.   clobetasol ointment 0.05 % Commonly known as: TEMOVATE Apply 1 application topically 2 (two) times daily. For hands   desloratadine 5 MG tablet Commonly known as: CLARINEX Take  5 mg by mouth daily.   dextromethorphan-guaiFENesin 30-600 MG 12hr tablet Commonly known as: MUCINEX DM Take 1 tablet by mouth 2 (two) times daily.   FLONASE SENSIMIST NA Place into the nose daily as needed.   FLUoxetine 40 MG capsule Commonly known as: PROZAC Take 1 tablet daily   hydrochlorothiazide 25 MG tablet Commonly known as: HYDRODIURIL Take 1 tablet (25 mg total) by mouth daily.   HYDROcodone-acetaminophen 5-325 MG tablet Commonly known as: NORCO/VICODIN Take 1 tablet by mouth 3 (three) times daily as needed.   Magnesium Gluconate 250 MG Tabs Take 500 mg by mouth daily.   metoprolol succinate 50 MG 24 hr  tablet Commonly known as: TOPROL-XL Take 1 tablet (50 mg total) by mouth daily.   Pazeo 0.7 % Soln Generic drug: Olopatadine HCl INT 1 GTT IN OU QD   predniSONE 10 MG tablet Commonly known as: DELTASONE 4 tablets x 2 days, 3 tabs x 2 days, 2 tabs x 2 days, 1 tab x 2 days Started by: Kathlene November, MD   pyridOXINE 100 MG tablet Commonly known as: VITAMIN B-6 Take 400 mg by mouth daily. Super B complex   terazosin 1 MG capsule Commonly known as: HYTRIN Take 3 capsules (3 mg total) by mouth daily.   Turmeric 500 MG Caps Take 500 mg by mouth daily.   vitamin C 1000 MG tablet Take 1,000 mg by mouth daily.   vitamin E 180 MG (400 UNITS) capsule Take 450 Units by mouth daily.          Objective:   Physical Exam Ht 5\' 11"  (1.803 m)   Wt 231 lb (104.8 kg)   BMI 32.22 kg/m  This is a telephone virtual visit, alert oriented x3, in no distress.  Did notice some cough along with large airway congestion and a slight increase expiratory time.    Assessment      Assessment Prediabetes  HTN Hyperlipidemia Anxiety Dr Fay Records (psych) Rx prozac ~12-2014  Asthma  MSK:  --Chronic back pain, pain medication as needed, used to see Dr Nelva Bush, did PT @ [facility] [phone], better --DJD- hands Chronic prostatitis, sees urology Psoriasis, hands, sees dermatology Snoring (severe per pt)- dentist Rx a moth guard ~ 08-2015, helps  PLAN: Asthma exacerbation: Was seen with exacerbation 04/07/2020, chest x-ray was negative, 2 Covid test were negative, he improved after Zithromax and prednisone. Symptoms come back few days ago as described above. Plan: Continue Advair (unable to get Symbicort d/t  insurance), continue albuterol, Mucinex and Delsym. Will send a round of prednisone, asked the patient to check a Covid test again and let me know if positive. If no better will need to be seen in person in few days.   I discussed the assessment and treatment plan with the patient. The patient was  provided an opportunity to ask questions and all were answered. The patient agreed with the plan and demonstrated an understanding of the instructions.   The patient was advised to call back or seek an in-person evaluation if the symptoms worsen or if the condition fails to improve as anticipated.  I provided 20 minutes of non-face-to-face time during this encounter.  Kathlene November, MD

## 2020-05-04 NOTE — Assessment & Plan Note (Signed)
Asthma exacerbation: Was seen with exacerbation 04/07/2020, chest x-ray was negative, 2 Covid test were negative, he improved after Zithromax and prednisone. Symptoms come back few days ago as described above. Plan: Continue Advair (unable to get Symbicort d/t  insurance), continue albuterol, Mucinex and Delsym. Will send a round of prednisone, asked the patient to check a Covid test again and let me know if positive. If no better will need to be seen in person in few days.

## 2020-05-05 ENCOUNTER — Telehealth: Payer: Self-pay | Admitting: Internal Medicine

## 2020-05-05 ENCOUNTER — Other Ambulatory Visit: Payer: Self-pay

## 2020-05-05 DIAGNOSIS — M5459 Other low back pain: Secondary | ICD-10-CM | POA: Diagnosis not present

## 2020-05-05 MED ORDER — AMOXICILLIN 500 MG PO CAPS: 500.0000 mg | ORAL_CAPSULE | Freq: Three times a day (TID) | ORAL | 0 refills | Status: DC

## 2020-05-05 NOTE — Telephone Encounter (Signed)
Send:  amoxicillin 500 mg 1 p.o. 3 times daily #21 no refills Patient to schedule office visit in person next week if no better

## 2020-05-05 NOTE — Telephone Encounter (Signed)
Please advise 

## 2020-05-05 NOTE — Telephone Encounter (Signed)
Rx sent into his pharmacy, called pt to advise and left detailed VM. -JMA

## 2020-05-05 NOTE — Telephone Encounter (Signed)
Patient states he has developed a sore throat, ear ache. He would like an antibiotic to be called. Patient stated he had two virtual already. Patient wants to speak with cma

## 2020-05-10 ENCOUNTER — Other Ambulatory Visit: Payer: Self-pay | Admitting: Internal Medicine

## 2020-05-10 DIAGNOSIS — I10 Essential (primary) hypertension: Secondary | ICD-10-CM

## 2020-05-25 ENCOUNTER — Ambulatory Visit: Payer: Medicare Other | Admitting: Internal Medicine

## 2020-05-25 ENCOUNTER — Ambulatory Visit (INDEPENDENT_AMBULATORY_CARE_PROVIDER_SITE_OTHER): Payer: Medicare Other | Admitting: Internal Medicine

## 2020-05-25 ENCOUNTER — Other Ambulatory Visit: Payer: Self-pay

## 2020-05-25 ENCOUNTER — Encounter: Payer: Self-pay | Admitting: Internal Medicine

## 2020-05-25 VITALS — BP 130/70 | HR 57 | Temp 97.4°F | Resp 18 | Ht 71.0 in

## 2020-05-25 DIAGNOSIS — J4520 Mild intermittent asthma, uncomplicated: Secondary | ICD-10-CM

## 2020-05-25 DIAGNOSIS — J4550 Severe persistent asthma, uncomplicated: Secondary | ICD-10-CM

## 2020-05-25 MED ORDER — MONTELUKAST SODIUM 10 MG PO TABS: 10.0000 mg | ORAL_TABLET | Freq: Every day | ORAL | 3 refills | Status: DC

## 2020-05-25 NOTE — Progress Notes (Signed)
Subjective:    Patient ID: Kirk Sutton, male    DOB: 09-14-1950, 70 y.o.   MRN: [id number]  DOS:  05/25/2020 Type of visit - description: acute Patient continue with multiple symptoms: Sore throat, raspy voice, right ear ache, episodic cough, wheezing sometimes. From time to time he sees clear sputum.  No hemoptysis. Denies GERD except for sporadic heartburn. Good compliance with medicines.  Review of Systems See above   Past Medical History:  Diagnosis Date  . Allergic rhinitis   . Asthma   . Chronic prostatitis    sees urology  . Diabetes mellitus    type 11 (a1c 6.0 03/2008)/no meds.  . Hernia, umbilical   . Hyperlipidemia   . Hypertension   . Psoriasis    @ hands, sees derm    Past Surgical History:  Procedure Laterality Date  . APPENDECTOMY    . FERTILITY SURGERY  1979  . HERNIA REPAIR    . NASAL SEPTUM SURGERY  2009  . SHOULDER SURGERY  2008   left  . TESTICLE SURGERY     undescended repair     Allergies as of 05/25/2020      Reactions   Cetirizine Hcl    REACTION: prostatitis   Fexofenadine    REACTION: causes prostatitis      Medication List       Accurate as of May 25, 2020 11:59 PM. If you have any questions, ask your nurse or doctor.        STOP taking these medications   amoxicillin 500 MG capsule Commonly known as: AMOXIL Stopped by: Kathlene November, MD   budesonide-formoterol 160-4.5 MCG/ACT inhaler Commonly known as: SYMBICORT Stopped by: Kathlene November, MD   predniSONE 10 MG tablet Commonly known as: DELTASONE Stopped by: Kathlene November, MD     TAKE these medications   acetaminophen 500 MG tablet Commonly known as: TYLENOL Take 500 mg by mouth daily as needed for moderate pain.   Advair Diskus 250-50 MCG/DOSE Aepb Generic drug: Fluticasone-Salmeterol Prescribed by allergist   albuterol 108 (90 Base) MCG/ACT inhaler Commonly known as: VENTOLIN HFA INHALE 2 PUFFS INTO THE LUNGS EVERY 6 HOURS AS NEEDED FOR WHEEZING OR SHORTNESS OF BREATH    amphetamine-dextroamphetamine 10 MG tablet Commonly known as: ADDERALL Take 10 mg by mouth 2 (two) times daily with a meal.   atorvastatin 20 MG tablet Commonly known as: LIPITOR Take 1 tablet (20 mg total) by mouth at bedtime.   azelastine 0.1 % nasal spray Commonly known as: ASTELIN Place 1 spray into both nostrils every evening.   busPIRone 10 MG tablet Commonly known as: BUSPAR Take 10 mg by mouth 2 (two) times daily.   clobetasol ointment 0.05 % Commonly known as: TEMOVATE Apply 1 application topically 2 (two) times daily. For hands   desloratadine 5 MG tablet Commonly known as: CLARINEX Take 5 mg by mouth daily.   dextromethorphan-guaiFENesin 30-600 MG 12hr tablet Commonly known as: MUCINEX DM Take 1 tablet by mouth 2 (two) times daily.   FLONASE SENSIMIST NA Place into the nose daily as needed.   FLUoxetine 40 MG capsule Commonly known as: PROZAC Take 1 tablet daily   hydrochlorothiazide 25 MG tablet Commonly known as: HYDRODIURIL Take 1 tablet (25 mg total) by mouth daily.   HYDROcodone-acetaminophen 5-325 MG tablet Commonly known as: NORCO/VICODIN Take 1 tablet by mouth 3 (three) times daily as needed.   Magnesium Gluconate 250 MG Tabs Take 500 mg by mouth daily.   metoprolol  succinate 50 MG 24 hr tablet Commonly known as: TOPROL-XL Take 1 tablet (50 mg total) by mouth daily.   montelukast 10 MG tablet Commonly known as: SINGULAIR Take 1 tablet (10 mg total) by mouth at bedtime. Started by: Kathlene November, MD   Pazeo 0.7 % Soln Generic drug: Olopatadine HCl INT 1 GTT IN OU QD   pyridOXINE 100 MG tablet Commonly known as: VITAMIN B-6 Take 400 mg by mouth daily. Super B complex   terazosin 1 MG capsule Commonly known as: HYTRIN Take 3 capsules (3 mg total) by mouth daily.   Turmeric 500 MG Caps Take 500 mg by mouth daily.   vitamin C 1000 MG tablet Take 1,000 mg by mouth daily.   vitamin E 180 MG (400 UNITS) capsule Take 450 Units by mouth  daily.          Objective:   Physical Exam BP 130/70 (BP Location: Right Arm, Patient Position: Sitting, Cuff Size: Normal)   Pulse (!) 57   Temp (!) 97.4 F (36.3 C) (Oral)   Resp 18   Ht 5\' 11"  (1.803 m)   SpO2 98%   BMI 32.22 kg/m  General:   Well developed, NAD, BMI noted. HEENT:  Normocephalic . Face symmetric, atraumatic.  TMs slightly bulged.  Not red. Throat: Symmetric. Lungs:  Scattered rhonchi, no wheezing, some cough noted. Normal respiratory effort, no intercostal retractions, no accessory muscle use. Heart: RRR,  no murmur.  Lower extremities: no pretibial edema bilaterally  Skin: Not pale. Not jaundice Neurologic:  alert & oriented X3.  Speech normal, gait appropriate for age and unassisted Psych--  Cognition and judgment appear intact.  Cooperative with normal attention span and concentration.  Behavior appropriate. No anxious or depressed appearing.      Assessment      Assessment Prediabetes  HTN Hyperlipidemia Anxiety Dr Fay Records (psych) Rx prozac ~12-2014  Cough variant asthma, atopic dermatitis, allergic rhinitis and allergic conjunctivitis. MSK:  --Chronic back pain, pain medication as needed, used to see Dr Nelva Bush, did PT @ [facility] [phone], better --DJD- hands Chronic prostatitis, sees urology Psoriasis, hands, sees dermatology Snoring (severe per pt)- dentist Rx a moth guard ~ 08-2015, helps  PLAN: Asthma,  Seen by his allergist 03/24/2020, at that time he was doing well.   Shortly after the visit, his symptoms returned, I have seen him x 2, S/p 2 rounds of antibiotics and 3 rounds of prednisone. Chest x-ray 04/07/2020 neg Good compliance with medications including Advair (unable to get Symbicort), albuterol, Astelin, Clarinex, Flonase. Plan: Add singular, refer back to Dr. Gwyndolyn Saxon.   This visit occurred during the SARS-CoV-2 public health emergency.  Safety protocols were in place, including screening questions prior to the visit,  additional usage of staff PPE, and extensive cleaning of exam room while observing appropriate contact time as indicated for disinfecting solutions.

## 2020-05-25 NOTE — Patient Instructions (Signed)
Continue the same medications, add a tablet called Singulair, once daily.  Please call your allergist Dr. Ova Freshwater tomorrow and set up an appointment.

## 2020-05-26 NOTE — Assessment & Plan Note (Signed)
Asthma,  Seen by his allergist 03/24/2020, at that time he was doing well.   Shortly after the visit, his symptoms returned, I have seen him x 2, S/p 2 rounds of antibiotics and 3 rounds of prednisone. Chest x-ray 04/07/2020 neg Good compliance with medications including Advair (unable to get Symbicort), albuterol, Astelin, Clarinex, Flonase. Plan: Add singular, refer back to Dr. Gwyndolyn Saxon.

## 2020-05-27 ENCOUNTER — Encounter: Payer: Self-pay | Admitting: Gastroenterology

## 2020-06-01 DIAGNOSIS — M1811 Unilateral primary osteoarthritis of first carpometacarpal joint, right hand: Secondary | ICD-10-CM | POA: Diagnosis not present

## 2020-06-01 DIAGNOSIS — J45991 Cough variant asthma: Secondary | ICD-10-CM | POA: Diagnosis not present

## 2020-06-01 DIAGNOSIS — M654 Radial styloid tenosynovitis [de Quervain]: Secondary | ICD-10-CM | POA: Diagnosis not present

## 2020-06-01 DIAGNOSIS — L209 Atopic dermatitis, unspecified: Secondary | ICD-10-CM | POA: Diagnosis not present

## 2020-06-01 DIAGNOSIS — J301 Allergic rhinitis due to pollen: Secondary | ICD-10-CM | POA: Diagnosis not present

## 2020-06-01 DIAGNOSIS — R051 Acute cough: Secondary | ICD-10-CM | POA: Diagnosis not present

## 2020-06-01 DIAGNOSIS — M180 Bilateral primary osteoarthritis of first carpometacarpal joints: Secondary | ICD-10-CM | POA: Diagnosis not present

## 2020-06-01 DIAGNOSIS — J3089 Other allergic rhinitis: Secondary | ICD-10-CM | POA: Diagnosis not present

## 2020-06-01 DIAGNOSIS — M1812 Unilateral primary osteoarthritis of first carpometacarpal joint, left hand: Secondary | ICD-10-CM | POA: Diagnosis not present

## 2020-06-07 ENCOUNTER — Other Ambulatory Visit: Payer: Self-pay | Admitting: Internal Medicine

## 2020-06-08 ENCOUNTER — Ambulatory Visit (INDEPENDENT_AMBULATORY_CARE_PROVIDER_SITE_OTHER): Payer: Medicare (Managed Care) | Admitting: Pharmacist

## 2020-06-08 DIAGNOSIS — R739 Hyperglycemia, unspecified: Secondary | ICD-10-CM

## 2020-06-08 DIAGNOSIS — E785 Hyperlipidemia, unspecified: Secondary | ICD-10-CM

## 2020-06-08 DIAGNOSIS — I10 Essential (primary) hypertension: Secondary | ICD-10-CM | POA: Diagnosis not present

## 2020-06-08 DIAGNOSIS — J4550 Severe persistent asthma, uncomplicated: Secondary | ICD-10-CM | POA: Diagnosis not present

## 2020-06-08 DIAGNOSIS — F411 Generalized anxiety disorder: Secondary | ICD-10-CM

## 2020-06-08 NOTE — Progress Notes (Signed)
See Care Management note

## 2020-06-08 NOTE — Chronic Care Management (AMB) (Signed)
Chronic Care Management Pharmacy Note  06/08/2020 Name:  Kirk Sutton MRN:  [id number] DOB:  1950/09/23  Subjective: Kirk Sutton is an 70 y.o. year old male who is a primary patient of Paz, Alda Berthold, MD.  The CCM team was consulted for assistance with disease management and care coordination needs.    Engaged with patient by telephone for follow up visit in response to provider referral for pharmacy case management and/or care coordination services.   Consent to Services:  The patient was given information about Chronic Care Management services, agreed to services, and gave verbal consent prior to initiation of services.  Please see initial visit note for detailed documentation.   Patient Care Team: Colon Branch, MD as PCP - General Thomasene Mohair Verner Chol., MD as Consulting Physician (Urology) Calvert Cantor, MD as Consulting Physician (Ophthalmology) Orbie Hurst, MD as Consulting Physician (Dermatology) Gardiner Barefoot, DPM as Consulting Physician (Podiatry) Roseanne Kaufman, MD as Consulting Physician (Orthopedic Surgery) Ricard Dillon, MD (Psychiatry) Suella Broad, MD as Consulting Physician (Physical Medicine and Rehabilitation) Day, Melvenia Beam, [facility] (Inactive) as Pharmacist (Pharmacist) Tiajuana Amass, MD as Referring Physician (Allergy and Immunology)  Recent office visits: 05/25/2020 - PCP (Dr Larose Kells) Seen for sore throat / intermittent asthma. Seen by his allergist 03/24/2020, at that time he was doing well.   Shortly after the visit, his symptoms returned, I have seen him x 2, S/p 2 rounds of antibiotics and 3 rounds of prednisone. Chest x-ray 04/07/2020 neg. Noted good adherence to current medication regiment for asthma - Advair (unable to get Symbicort), albuterol, Astelin, Clarinex, Flonase. Added singular and refer back to allergist.  Recent consult visits: 06/02/2020 - pulm / Allergist ([name]) no note available but patient states Spiriva and benzonatate added. Also  received injection of methylprednisolone.  06/01/2020 - Ortho (Dr Amedeo Plenty) Seen for osteoarthritis of the Carpometacarpal Joint of the Thumb; Tenosynovitis of both hands with pain. Received bilateral corticosteroid injection into the [facility] joint. RTC 4 to 6 months   04/25/2020 - Ortho Levy Pupa, NP). Seen for Lumbar radiculitis in an L1,L2 nerve distribution on left and pspinal stenosis at moderate L4-5 and L5-S1 with persistent back pain. Last MRI 2014. Recommended continue with physical therapy as this appears to have improved back pain (or also mentioned possibly related to steroid he has been taking for cough / asthma exacerbation). F/U 3 months.   02/24/2020 - Urology (Dr Thomasene Mohair [facility]). Seen for BPH, mild obstruction. Ccurrently on Hytrin and this seems to work well with his outlet obstruction. His postvoid residual last visit was 45 and his current postvoid residual was only 35. His PSA is 0.26. Urinalysis negative. Rectal exam demonstrates 20 g benign gland with adequate channel. No evidence of nodule or induration. Erectile function is reviewed. Sildenafil 20 mg. PLAN: Annual PSA DRE PVR. Continue Sildenafil 20 mg.   Hospital visits: None in previous 6 months  Objective:  Lab Results  Component Value Date   CREATININE 1.18 04/26/2020   CREATININE 1.25 (H) 12/23/2019   CREATININE 1.20 12/22/2018    Lab Results  Component Value Date   HGBA1C 6.2 04/26/2020   Last diabetic Eye exam:  Lab Results  Component Value Date/Time   HMDIABEYEEXA normal 10/17/2008 12:00 AM    Last diabetic Foot exam:  Lab Results  Component Value Date/Time   HMDIABFOOTEX yes 02/16/2009 12:00 AM        Component Value Date/Time   CHOL 105 02/10/2020 1310   TRIG  65.0 02/10/2020 1310   TRIG 52 02/20/2006 1053   HDL 61.90 02/10/2020 1310   CHOLHDL 2 02/10/2020 1310   VLDL 13.0 02/10/2020 1310   LDLCALC 31 02/10/2020 1310   LDLCALC 83 12/23/2019 0828    Hepatic Function Latest Ref Rng & Units  02/10/2020 12/23/2019 12/22/2018  Total Protein 6.1 - 8.1 g/dL - 6.1 6.4  Albumin 3.5 - 5.2 g/dL - - 4.2  AST 0 - 37 U/L _0 ALT 0 - 53 U/L _1 Alk Phosphatase 39 - 117 U/L - - 66  Total Bilirubin 0.2 - 1.2 mg/dL - 0.9 1.0  Bilirubin, Direct 0.0 - 0.3 mg/dL - - -    Lab Results  Component Value Date/Time   TSH 1.25 12/22/2018 01:33 PM   TSH 1.29 11/20/2017 08:40 AM    CBC Latest Ref Rng & Units 12/23/2019 12/22/2018 11/20/2017  WBC 3.8 - 10.8 Thousand/uL 5.5 8.4 5.7  Hemoglobin 13.2 - 17.1 g/dL 12.9(L) 13.5 13.0  Hematocrit 38.5 - 50.0 % 39.0 40.2 38.3(L)  Platelets 140 - 400 Thousand/uL 139(L) 151.0 132.0(L)    No results found for: VD25OH  Clinical ASCVD: No; ASCVD risk estimate per previous LDL was 20.9%    Social History   Tobacco Use  Smoking Status Never Smoker  Smokeless Tobacco Never Used   BP Readings from Last 3 Encounters:  05/25/20 130/70  04/26/20 (!) 155/90  03/31/20 (!) 168/102   Pulse Readings from Last 3 Encounters:  05/25/20 (!) 57  04/26/20 (!) 57  12/23/19 (!) 52   Wt Readings from Last 3 Encounters:  05/03/20 231 lb (104.8 kg)  05/02/20 231 lb (104.8 kg)  04/26/20 231 lb (104.8 kg)    Assessment: Review of patient past medical history, allergies, medications, health status, including review of consultants reports, laboratory and other test data, was performed as part of comprehensive evaluation and provision of chronic care management services.   SDOH:  (Social Determinants of Health) assessments and interventions performed:    CCM Care Plan  Allergies  Allergen Reactions  . Cetirizine Hcl     REACTION: prostatitis  . Fexofenadine     REACTION: causes prostatitis    Medications Reviewed Today    Reviewed by Sanda Linger, CMA (Certified Medical Assistant) on 05/25/20 at 1544  Med List Status: <None>  Medication Order Taking? Sig Documenting Provider Last Dose Status Informant  acetaminophen (TYLENOL) 500 MG tablet  [id number] Yes Take 500 mg by mouth daily as needed for moderate pain. [provider] Taking Active            Med Note Alinda Money, [place] Dec 05, 2015  8:52 AM) PRN  albuterol (VENTOLIN HFA) 108 (90 Base) MCG/ACT inhaler [id number] Yes INHALE 2 PUFFS INTO THE LUNGS EVERY 6 HOURS AS NEEDED FOR WHEEZING OR SHORTNESS OF BREATH Colon Branch, MD Taking Active   amoxicillin (AMOXIL) 500 MG capsule [id number] Yes Take 1 capsule (500 mg total) by mouth 3 (three) times daily. Colon Branch, MD Taking Active   amphetamine-dextroamphetamine (ADDERALL) 10 MG tablet [id number] Yes Take 10 mg by mouth 2 (two) times daily with a meal. [provider] Taking Active   Ascorbic Acid (VITAMIN C) 1000 MG tablet [id number] Yes Take 1,000 mg by mouth daily. [provider] Taking Active   atorvastatin (LIPITOR) 20 MG tablet [id number] Yes Take 1 tablet (20 mg total) by mouth at bedtime. Colon Branch, MD Taking Active  azelastine (ASTELIN) 0.1 % nasal spray [id number] Yes Place 1 spray into both nostrils every evening. [provider] Taking Active   budesonide-formoterol (SYMBICORT) 160-4.5 MCG/ACT inhaler [id number] Yes Inhale 2 puffs into the lungs 2 (two) times daily. [provider] Taking Active   busPIRone (BUSPAR) 10 MG tablet [id number] Yes Take 10 mg by mouth 2 (two) times daily. [provider] Taking Active   clobetasol ointment (TEMOVATE) 0.05 % [id number] Yes Apply 1 application topically 2 (two) times daily. For hands [provider] Taking Active   desloratadine (CLARINEX) 5 MG tablet [id number] Yes Take 5 mg by mouth daily. [provider] Taking Active   dextromethorphan-guaiFENesin [facility] DM) 30-600 MG 12hr tablet [id number] Yes Take 1 tablet by mouth 2 (two) times daily. [provider] Taking Active   FLUoxetine (PROZAC) 40 MG capsule [id number] Yes Take 1 tablet daily [provider] Taking Active            Med Note  (CANTER, Cheri Rous   Tue Apr 12, 2015  2:47 PM)    Fluticasone [place]) [id number] Yes Place into the nose daily as needed. [provider] Taking Active Self  hydrochlorothiazide (HYDRODIURIL) 25 MG tablet [id number] Yes Take 1 tablet (25 mg total) by mouth daily. Colon Branch, MD Taking Active   HYDROcodone-acetaminophen (NORCO/VICODIN) 5-325 MG tablet [id number] Yes Take 1 tablet by mouth 3 (three) times daily as needed. [provider] Taking Active            Med Note De Blanch   Tue Mar 08, 2020  9:18 AM) Per ortho  Magnesium Gluconate 250 MG TABS [id number] Yes Take 500 mg by mouth daily.  [provider] Taking Active   metoprolol succinate (TOPROL-XL) 50 MG 24 hr tablet [id number] Yes Take 1 tablet (50 mg total) by mouth daily. Colon Branch, MD Taking Active   PAZEO 0.7 % Bailey Mech [id number] Yes INT 1 GTT IN OU QD [provider] Taking Active            Med Note Wilfrid Lund   Wed Feb 23, 2015 10:50 AM) PRN  predniSONE (DELTASONE) 10 MG tablet [id number] Yes 4 tablets x 2 days, 3 tabs x 2 days, 2 tabs x 2 days, 1 tab x 2 days Colon Branch, MD Taking Active   pyridOXINE (VITAMIN B-6) 100 MG tablet [id number] Yes Take 400 mg by mouth daily. Super B complex [provider] Taking Active Self  terazosin (HYTRIN) 1 MG capsule [id number] Yes Take 3 capsules (3 mg total) by mouth daily. Colon Branch, MD Taking Active   Turmeric 500 MG CAPS [id number] Yes Take 500 mg by mouth daily.  [provider] Taking Active   vitamin E 180 MG (400 UNITS) capsule [id number] Yes Take 450 Units by mouth daily. [provider] Taking Active           Patient Active Problem List   Diagnosis Date Noted  . Chronic back pain 04/30/2018  . DJD (degenerative joint disease) 05/22/2017  . Snoring 04/03/2016  . PCP NOTES >>>>> 02/24/2015  . Pain managment 03/08/2014  . Anxiety state 07/15/2012  . Annual physical exam 07/20/2010  .  Hyperglycemia 08/11/2008  . PROSTATITIS, CHRONIC 03/31/2008  . Dyslipidemia 10/13/2007  . ALLERGIC RHINITIS 10/17/2006  . Essential hypertension 04/09/2006  . Extrinsic asthma 04/09/2006    Immunization History  Administered Date(s) Administered  . Fluad Quad(high Dose 65+) 12/22/2018  . H1N1 03/31/2008  .  Influenza Split 01/22/2011, 01/10/2012  . Influenza Whole 01/17/2007, 12/24/2007, 11/17/2009  . Influenza, High Dose Seasonal PF 02/23/2015, 01/13/2016, 01/02/2017, 01/16/2018  . Influenza,inj,Quad PF,6+ Mos 01/20/2013, 03/08/2014, 12/23/2019  . PFIZER(Purple Top)SARS-COV-2 Vaccination 05/21/2019, 07/13/2019  . Pneumococcal Conjugate-13 09/03/2014  . Pneumococcal Polysaccharide-23 09/01/2013  . Td 03/19/2001  . Tdap 08/22/2011  . Zoster 08/22/2011    Conditions to be addressed/monitored: HTN, HLD, Anxiety, Depression, Pulmonary Disease and BPH  There are no care plans that you recently modified to display for this patient.   Medication Assistance: None required.  Patient affirms current coverage meets needs. He is on 2 inhalers now - Advair HFA and Spiriva. Both are $47/ month copay. Discussed potential to use either Breztri (non formulary) or Trelegy which is triple therapy medication and would only be 1 copay of $47/month. Patient to discuss with pulmonology / allergist [name] if Trelegy would be appropriate alternative to Advair HFA + Spiriva.  Patient's preferred pharmacy is:  [id number] [address], [address] AT [facility] [address] [place] [address] Phone: [phone] Fax: [phone]  [facility] ORDER) [place], [place] [address] Phone: [phone] Fax: [phone]  Follow Up:  Patient agrees to Care Plan and Follow-up.  Plan: Telephone follow up appointment with care management team member scheduled for:  3  months  Cherre Robins, PharmD Clinical Pharmacist [facility]

## 2020-06-16 NOTE — Patient Instructions (Signed)
Visit Information  PATIENT GOALS: Goals Addressed            This Visit's Progress   . Chronic Care Management Pharmacy Care Plan   On track    CARE PLAN ENTRY (see longitudinal plan of care for additional care plan information)  Current Barriers:  . Chronic Disease Management support, education, and care coordination needs related to Asthma, Pre-Diabetes, Hypertension, Hyperlipidemia, Anxiety, BPH, Allergies   Hypertension BP Readings from Last 3 Encounters:  05/25/20 130/70  04/26/20 (!) 155/90  03/31/20 (!) 168/102   . Pharmacist Clinical Goal(s): o Over the next 90 days, patient will work with PharmD and providers to achieve BP goal <140/90 . Current regimen:   Hctz 25mg  daily each morning  Metoprolol succinate 50mg  daily at bedtime . Interventions: o Check blood pressure 1 to 2 times per week. . Patient self care activities - Over the next 90 days, patient will: o Check blood pressure 1 to 2  per week and provide at future appointments o Ensure daily salt intake < 2300 mg/day  Hyperlipidemia/ASCVD Risk Lab Results  Component Value Date/Time   LDLCALC 31 02/10/2020 01:10 PM   [place] 83 12/23/2019 08:28 AM   . Pharmacist Clinical Goal(s): o Over the next 90 days, patient will work with PharmD and providers to maintain LDL goal < 100 . Current regimen:  o Atorvastatin 20mg  daily . Interventions: o Discussed ASCVD Risk and potential benefit for statin therapy. Discussed patient's concerns with potential adverse effects of statin therapy . Patient self care activities - Over the next 90 days, patient will: o Continue to take atorvastatin 20mg  daily o Please call either the clinical pharmacist or your primary care physician if you have further questions regarding the benefits or risks of statin therapy.  Pre-Diabetes Lab Results  Component Value Date/Time   HGBA1C 6.2 04/26/2020 08:28 AM   HGBA1C 5.5 12/23/2019 08:28 AM   . Pharmacist Clinical Goal(s): o Over  the next 90 days, patient will work with PharmD and providers to maintain A1c goal <6.5% . Current regimen:  o Diet and exercise management   . Interventions: o Discussed importance of diet and exercise . Patient self care activities - Over the next 90 days, patient will: o Continue to limit sugar. Increase intake of vegetables - especially green leafy vegetables.  o Goal for exercise is at least 150 minutes per week.  o Maintain a1c less than 6.5%  Asthma / Allergies:  . Pharmacist Clinical Goal(s): o Over the next 90 days, patient will work with PharmD and providers to improve breathing and allergy symptoms . Current regimen:  o Albuterol inhaler as needed for wheezing or shortness of breath o Advair HFA 115/21 inhale 2 puffs twice a day o Spiriva 2.5mg  inhaler 2 puffs once a day o Astelin nasal spray as needed o Flonase nasal spray as needed o Montelukast 10mg  daily at night  o Desloratidine 5mg  daily  o Pazeo Solution - 1 drop in both eyes as needed . Interventions: o Discussed that it might take 1 month of use to see full effects of montelukast and Spiriva might take 2 to 4 weeks.  o Reviewed his medication formulary - Advair and Spiriva are both $47/month. He might be able to utilize Trelegy in place of (Adviar + Spiriva) and just have 1 $47/month copay. Patient to discuss with allergist - Jean Rosenthal, MD . Patient self care activities - Over the next 90 days, patient will: o Use maintenance inhalers daily -  Advair and Spiriva o Discuss Trelegy with [name]  Medication management . Pharmacist Clinical Goal(s): o Over the next 90 days, patient will work with PharmD and providers to maintain optimal medication adherence . Current pharmacy: [facility] . Interventions o Comprehensive medication review performed. o Continue current medication management strategy . Patient self care activities - Over the next 90 days, patient will: o Focus on medication adherence by filling and  taking medications appropriately  o Take medications as prescribed o Report any questions or concerns to PharmD and/or provider(s)  Please see past updates related to this goal by clicking on the "Past Updates" button in the selected goal         The patient verbalized understanding of instructions, educational materials, and care plan provided today and agreed to receive a mailed copy of patient instructions, educational materials, and care plan.   Telephone follow up appointment with care management team member scheduled for: 3 months  Cherre Robins, PharmD Clinical Pharmacist [facility]

## 2020-06-17 DIAGNOSIS — M5416 Radiculopathy, lumbar region: Secondary | ICD-10-CM | POA: Diagnosis not present

## 2020-06-24 ENCOUNTER — Telehealth: Payer: Self-pay | Admitting: Internal Medicine

## 2020-06-24 ENCOUNTER — Ambulatory Visit (INDEPENDENT_AMBULATORY_CARE_PROVIDER_SITE_OTHER): Payer: Medicare Other | Admitting: Pharmacist

## 2020-06-24 DIAGNOSIS — I10 Essential (primary) hypertension: Secondary | ICD-10-CM

## 2020-06-24 DIAGNOSIS — E785 Hyperlipidemia, unspecified: Secondary | ICD-10-CM

## 2020-06-24 NOTE — Patient Instructions (Signed)
Visit Information  PATIENT GOALS: Goals Addressed            This Visit's Progress   . Chronic Care Management Pharmacy Care Plan       CARE PLAN ENTRY (see longitudinal plan of care for additional care plan information)  Current Barriers:  . Chronic Disease Management support, education, and care coordination needs related to Asthma, Pre-Diabetes, Hypertension, Hyperlipidemia, Anxiety, BPH, Allergies   Hypertension BP Readings from Last 3 Encounters:  05/25/20 130/70  04/26/20 (!) 155/90  03/31/20 (!) 168/102   . Pharmacist Clinical Goal(s): o Over the next 90 days, patient will work with PharmD and providers to achieve BP goal <140/90 . Current regimen:   Hctz 25mg  daily each morning  Metoprolol succinate 50mg  daily at bedtime . Interventions: o Check blood pressure 1 to 2 times per week. . Patient self care activities - Over the next 90 days, patient will: o Check blood pressure 1 to 2  per week and provide at future appointments o Ensure daily salt intake < 2300 mg/day  Hyperlipidemia/ASCVD Risk Lab Results  Component Value Date/Time   LDLCALC 31 02/10/2020 01:10 PM   [place] 83 12/23/2019 08:28 AM   . Pharmacist Clinical Goal(s): o Over the next 90 days, patient will work with PharmD and providers to maintain LDL goal < 100 . Current regimen:  o Atorvastatin 20mg  daily . Interventions: o Discussed ASCVD Risk and potential benefit for statin therapy. Discussed patient's concerns with potential adverse effects of statin therapy . Patient self care activities - Over the next 90 days, patient will: o Continue to take atorvastatin 20mg  daily o Please call either the clinical pharmacist or your primary care physician if you have further questions regarding the benefits or risks of statin therapy. o Ok to take CoEnzyme Q10 100mg  daily if needed or prevention of muscle pain attributed to atorvastatin  Pre-Diabetes Lab Results  Component Value Date/Time   HGBA1C 6.2  04/26/2020 08:28 AM   HGBA1C 5.5 12/23/2019 08:28 AM   . Pharmacist Clinical Goal(s): o Over the next 90 days, patient will work with PharmD and providers to maintain A1c goal <6.5% . Current regimen:  o Diet and exercise management   . Interventions: o Discussed importance of diet and exercise . Patient self care activities - Over the next 90 days, patient will: o Continue to limit sugar. Increase intake of vegetables - especially green leafy vegetables.  o Goal for exercise is at least 150 minutes per week.  o Maintain a1c less than 6.5%  Asthma / Allergies:  . Pharmacist Clinical Goal(s): o Over the next 90 days, patient will work with PharmD and providers to improve breathing and allergy symptoms . Current regimen:  o Albuterol inhaler as needed for wheezing or shortness of breath o Advair HFA 115/21 inhale 2 puffs twice a day o Spiriva 2.5mg  inhaler 2 puffs once a day o Astelin nasal spray as needed o Flonase nasal spray as needed o Montelukast 10mg  daily at night  o Desloratidine 5mg  daily  o Pazeo Solution - 1 drop in both eyes as needed . Interventions: o Discussed that it might take 1 month of use to see full effects of montelukast and Spiriva might take 2 to 4 weeks.  o Reviewed his medication formulary - Advair and Spiriva are both $47/month. He might be able to utilize Trelegy in place of (Adviar + Spiriva) and just have 1 $47/month copay. Patient to discuss with allergist - Jean Rosenthal, MD .  Patient self care activities - Over the next 90 days, patient will: o Use maintenance inhalers daily - Advair and Spiriva o Discuss Trelegy with [name]  Medication management . Pharmacist Clinical Goal(s): o Over the next 90 days, patient will work with PharmD and providers to maintain optimal medication adherence . Current pharmacy: [facility] . Interventions o Comprehensive medication review performed. o Continue current medication management strategy . Patient self care  activities - Over the next 90 days, patient will: o Focus on medication adherence by filling and taking medications appropriately  o Take medications as prescribed o Report any questions or concerns to PharmD and/or provider(s)  Please see past updates related to this goal by clicking on the "Past Updates" button in the selected goal         The patient verbalized understanding of instructions, educational materials, and care plan provided today and declined offer to receive copy of patient instructions, educational materials, and care plan.   Telephone follow up appointment with care management team member scheduled for:  3 months  Cherre Robins, PharmD Clinical Pharmacist [place] [facility]

## 2020-06-24 NOTE — Telephone Encounter (Signed)
Patient called back - see CCM note for more details.

## 2020-06-24 NOTE — Chronic Care Management (AMB) (Signed)
Chronic Care Management Pharmacy Note  06/08/2020 Name:  Kirk Sutton MRN:  [id number] DOB:  1950/09/23  Subjective: Kirk Sutton is an 70 y.o. year old male who is a primary patient of Paz, Alda Berthold, MD.  The CCM team was consulted for assistance with disease management and care coordination needs.    Engaged with patient by telephone for follow up visit in response to provider referral for pharmacy case management and/or care coordination services.   Consent to Services:  The patient was given information about Chronic Care Management services, agreed to services, and gave verbal consent prior to initiation of services.  Please see initial visit note for detailed documentation.   Patient Care Team: Colon Branch, MD as PCP - General Thomasene Mohair Verner Chol., MD as Consulting Physician (Urology) Calvert Cantor, MD as Consulting Physician (Ophthalmology) Orbie Hurst, MD as Consulting Physician (Dermatology) Gardiner Barefoot, DPM as Consulting Physician (Podiatry) Roseanne Kaufman, MD as Consulting Physician (Orthopedic Surgery) Ricard Dillon, MD (Psychiatry) Suella Broad, MD as Consulting Physician (Physical Medicine and Rehabilitation) Day, Melvenia Beam, [facility] (Inactive) as Pharmacist (Pharmacist) Tiajuana Amass, MD as Referring Physician (Allergy and Immunology)  Recent office visits: 05/25/2020 - PCP (Dr Larose Kells) Seen for sore throat / intermittent asthma. Seen by his allergist 03/24/2020, at that time he was doing well.   Shortly after the visit, his symptoms returned, I have seen him x 2, S/p 2 rounds of antibiotics and 3 rounds of prednisone. Chest x-ray 04/07/2020 neg. Noted good adherence to current medication regiment for asthma - Advair (unable to get Symbicort), albuterol, Astelin, Clarinex, Flonase. Added singular and refer back to allergist.  Recent consult visits: 06/02/2020 - pulm / Allergist ([name]) no note available but patient states Spiriva and benzonatate added. Also  received injection of methylprednisolone.  06/01/2020 - Ortho (Dr Amedeo Plenty) Seen for osteoarthritis of the Carpometacarpal Joint of the Thumb; Tenosynovitis of both hands with pain. Received bilateral corticosteroid injection into the [facility] joint. RTC 4 to 6 months   04/25/2020 - Ortho Levy Pupa, NP). Seen for Lumbar radiculitis in an L1,L2 nerve distribution on left and pspinal stenosis at moderate L4-5 and L5-S1 with persistent back pain. Last MRI 2014. Recommended continue with physical therapy as this appears to have improved back pain (or also mentioned possibly related to steroid he has been taking for cough / asthma exacerbation). F/U 3 months.   02/24/2020 - Urology (Dr Thomasene Mohair [facility]). Seen for BPH, mild obstruction. Ccurrently on Hytrin and this seems to work well with his outlet obstruction. His postvoid residual last visit was 45 and his current postvoid residual was only 35. His PSA is 0.26. Urinalysis negative. Rectal exam demonstrates 20 g benign gland with adequate channel. No evidence of nodule or induration. Erectile function is reviewed. Sildenafil 20 mg. PLAN: Annual PSA DRE PVR. Continue Sildenafil 20 mg.   Hospital visits: None in previous 6 months  Objective:  Lab Results  Component Value Date   CREATININE 1.18 04/26/2020   CREATININE 1.25 (H) 12/23/2019   CREATININE 1.20 12/22/2018    Lab Results  Component Value Date   HGBA1C 6.2 04/26/2020   Last diabetic Eye exam:  Lab Results  Component Value Date/Time   HMDIABEYEEXA normal 10/17/2008 12:00 AM    Last diabetic Foot exam:  Lab Results  Component Value Date/Time   HMDIABFOOTEX yes 02/16/2009 12:00 AM        Component Value Date/Time   CHOL 105 02/10/2020 1310   TRIG  65.0 02/10/2020 1310   TRIG 52 02/20/2006 1053   HDL 61.90 02/10/2020 1310   CHOLHDL 2 02/10/2020 1310   VLDL 13.0 02/10/2020 1310   LDLCALC 31 02/10/2020 1310   LDLCALC 83 12/23/2019 0828    Hepatic Function Latest Ref Rng & Units  02/10/2020 12/23/2019 12/22/2018  Total Protein 6.1 - 8.1 g/dL - 6.1 6.4  Albumin 3.5 - 5.2 g/dL - - 4.2  AST 0 - 37 U/L _0 ALT 0 - 53 U/L _1 Alk Phosphatase 39 - 117 U/L - - 66  Total Bilirubin 0.2 - 1.2 mg/dL - 0.9 1.0  Bilirubin, Direct 0.0 - 0.3 mg/dL - - -    Lab Results  Component Value Date/Time   TSH 1.25 12/22/2018 01:33 PM   TSH 1.29 11/20/2017 08:40 AM    CBC Latest Ref Rng & Units 12/23/2019 12/22/2018 11/20/2017  WBC 3.8 - 10.8 Thousand/uL 5.5 8.4 5.7  Hemoglobin 13.2 - 17.1 g/dL 12.9(L) 13.5 13.0  Hematocrit 38.5 - 50.0 % 39.0 40.2 38.3(L)  Platelets 140 - 400 Thousand/uL 139(L) 151.0 132.0(L)    No results found for: VD25OH  Clinical ASCVD: No; ASCVD risk estimate per previous LDL was 20.9%    Social History   Tobacco Use  Smoking Status Never Smoker  Smokeless Tobacco Never Used   BP Readings from Last 3 Encounters:  05/25/20 130/70  04/26/20 (!) 155/90  03/31/20 (!) 168/102   Pulse Readings from Last 3 Encounters:  05/25/20 (!) 57  04/26/20 (!) 57  12/23/19 (!) 52   Wt Readings from Last 3 Encounters:  05/03/20 231 lb (104.8 kg)  05/02/20 231 lb (104.8 kg)  04/26/20 231 lb (104.8 kg)    Assessment: Review of patient past medical history, allergies, medications, health status, including review of consultants reports, laboratory and other test data, was performed as part of comprehensive evaluation and provision of chronic care management services.   SDOH:  (Social Determinants of Health) assessments and interventions performed:    CCM Care Plan  Allergies  Allergen Reactions  . Cetirizine Hcl     REACTION: prostatitis  . Fexofenadine     REACTION: causes prostatitis    Medications Reviewed Today    Reviewed by Sanda Linger, CMA (Certified Medical Assistant) on 05/25/20 at 1544  Med List Status: <None>  Medication Order Taking? Sig Documenting Provider Last Dose Status Informant  acetaminophen (TYLENOL) 500 MG tablet  [id number] Yes Take 500 mg by mouth daily as needed for moderate pain. [provider] Taking Active            Med Note Alinda Money, [place] Dec 05, 2015  8:52 AM) PRN  albuterol (VENTOLIN HFA) 108 (90 Base) MCG/ACT inhaler [id number] Yes INHALE 2 PUFFS INTO THE LUNGS EVERY 6 HOURS AS NEEDED FOR WHEEZING OR SHORTNESS OF BREATH Colon Branch, MD Taking Active   amoxicillin (AMOXIL) 500 MG capsule [id number] Yes Take 1 capsule (500 mg total) by mouth 3 (three) times daily. Colon Branch, MD Taking Active   amphetamine-dextroamphetamine (ADDERALL) 10 MG tablet [id number] Yes Take 10 mg by mouth 2 (two) times daily with a meal. [provider] Taking Active   Ascorbic Acid (VITAMIN C) 1000 MG tablet [id number] Yes Take 1,000 mg by mouth daily. [provider] Taking Active   atorvastatin (LIPITOR) 20 MG tablet [id number] Yes Take 1 tablet (20 mg total) by mouth at bedtime. Colon Branch, MD Taking Active  azelastine (ASTELIN) 0.1 % nasal spray [id number] Yes Place 1 spray into both nostrils every evening. [provider] Taking Active   budesonide-formoterol (SYMBICORT) 160-4.5 MCG/ACT inhaler [id number] Yes Inhale 2 puffs into the lungs 2 (two) times daily. [provider] Taking Active   busPIRone (BUSPAR) 10 MG tablet [id number] Yes Take 10 mg by mouth 2 (two) times daily. [provider] Taking Active   clobetasol ointment (TEMOVATE) 0.05 % [id number] Yes Apply 1 application topically 2 (two) times daily. For hands [provider] Taking Active   desloratadine (CLARINEX) 5 MG tablet [id number] Yes Take 5 mg by mouth daily. [provider] Taking Active   dextromethorphan-guaiFENesin [facility] DM) 30-600 MG 12hr tablet [id number] Yes Take 1 tablet by mouth 2 (two) times daily. [provider] Taking Active   FLUoxetine (PROZAC) 40 MG capsule [id number] Yes Take 1 tablet daily [provider] Taking Active            Med Note  (CANTER, Cheri Rous   Tue Apr 12, 2015  2:47 PM)    Fluticasone [place]) [id number] Yes Place into the nose daily as needed. [provider] Taking Active Self  hydrochlorothiazide (HYDRODIURIL) 25 MG tablet [id number] Yes Take 1 tablet (25 mg total) by mouth daily. Colon Branch, MD Taking Active   HYDROcodone-acetaminophen (NORCO/VICODIN) 5-325 MG tablet [id number] Yes Take 1 tablet by mouth 3 (three) times daily as needed. [provider] Taking Active            Med Note De [place]anch   Tue Mar 08, 2020  9:18 AM) Per ortho  Magnesium Gluconate 250 MG TABS [id number] Yes Take 500 mg by mouth daily.  [provider] Taking Active   metoprolol succinate (TOPROL-XL) 50 MG 24 hr tablet [id number] Yes Take 1 tablet (50 mg total) by mouth daily. Colon Branch, MD Taking Active   PAZEO 0.7 % Bailey Mech [id number] Yes INT 1 GTT IN OU QD [provider] Taking Active            Med Note Wilfrid Lund   Wed Feb 23, 2015 10:50 AM) PRN  predniSONE (DELTASONE) 10 MG tablet [id number] Yes 4 tablets x 2 days, 3 tabs x 2 days, 2 tabs x 2 days, 1 tab x 2 days Colon Branch, MD Taking Active   pyridOXINE (VITAMIN B-6) 100 MG tablet [id number] Yes Take 400 mg by mouth daily. Super B complex [provider] Taking Active Self  terazosin (HYTRIN) 1 MG capsule [id number] Yes Take 3 capsules (3 mg total) by mouth daily. Colon Branch, MD Taking Active   Turmeric 500 MG CAPS [id number] Yes Take 500 mg by mouth daily.  [provider] Taking Active   vitamin E 180 MG (400 UNITS) capsule [id number] Yes Take 450 Units by mouth daily. [provider] Taking Active           Patient Active Problem List   Diagnosis Date Noted  . Chronic back pain 04/30/2018  . DJD (degenerative joint disease) 05/22/2017  . Snoring 04/03/2016  . PCP NOTES >>>>> 02/24/2015  . Pain managment 03/08/2014  . Anxiety state 07/15/2012  . Annual physical exam 07/20/2010  .  Hyperglycemia 08/11/2008  . PROSTATITIS, CHRONIC 03/31/2008  . Dyslipidemia 10/13/2007  . ALLERGIC RHINITIS 10/17/2006  . Essential hypertension 04/09/2006  . Extrinsic asthma 04/09/2006    Immunization History  Administered Date(s) Administered  . Fluad Quad(high Dose 65+) 12/22/2018  . H1N1 03/31/2008  .  Influenza Split 01/22/2011, 01/10/2012  . Influenza Whole 01/17/2007, 12/24/2007, 11/17/2009  . Influenza, High Dose Seasonal PF 02/23/2015, 01/13/2016, 01/02/2017, 01/16/2018  . Influenza,inj,Quad PF,6+ Mos 01/20/2013, 03/08/2014, 12/23/2019  . PFIZER(Purple Top)SARS-COV-2 Vaccination 05/21/2019, 07/13/2019  . Pneumococcal Conjugate-13 09/03/2014  . Pneumococcal Polysaccharide-23 09/01/2013  . Td 03/19/2001  . Tdap 08/22/2011  . Zoster 08/22/2011    Conditions to be addressed/monitored: HTN, HLD, Anxiety, Depression, Pulmonary Disease and BPH  General Pharmacy Care Plan:   Current Barriers:  . Does not contact provider office for questions/concerns . Optimization of medication therapy needed with regards to efficacy and cost  Pharmacist Clinical Goal(s):  [name] Kitchen Over the next 180 days, patient will adhere to plan to optimize therapeutic regimen for asthma, HTN and anxiety / depression as evidenced by report of adherence to recommended medication management changes through collaboration with PharmD and provider.   Interventions: . 1:1 collaboration with Colon Branch, MD regarding development and update of comprehensive plan of care as evidenced by provider attestation and co-signature . Inter-disciplinary care team collaboration (see longitudinal plan of care) . Comprehensive medication review performed; medication list updated in electronic medical record  Hypertension: . Variable controlled; current treatment: metoprolol succinate 90m daily and HCTZ 243mdaily . Current home readings: patient reports BP this morning was 127/71 . Denies hypotensive/hypertensive  symptoms . Counseled on BP goal of <130/80;  Recommended checking BP 2 to 3 times per week.    Hyperlipidemia: . controlled; current treatment: atorvastatin 2034maily   . Medications previously tried: none   Patient wanted to know if he could take CoEnzyme Q10 . Current dietary patterns: Patient reports he is trying to limit portion sizes . Counseled on ASCVD risk and reason for taking atorvastatin; Ok to take OTC CoEnzyme Q10 100m34mily if needed to prevent myalgia related to statin therapy; If continues to have myalgias, consider check CPK.  Asthma / Allergies: . Uncontrolled over last few months but just had Spiriva and montelukast added to his regimen this month  .       current treatment: Advair HFA 115/21mc64mice daily; albuterol as needed; Spiriva once daily; Astelin nasal spray; Flonase nasal spray; desloratadine and benzonatate 100mg 34mo 3 times a day if needed . Recommended Patient discuss with pulmonology possibly using Trelegy in place of Advair + Spiriva to lower monthly cost of 2 versus 1 copay.  Assessed / Reviewed patient's medication formulary and it look like Spiriva might be non formulary. Could consider alternative mentioned above or replace Spiriva with Incruse which is on his formulary. ,   Depression/Anxiety  . Controlled; current treatment: Fluoxetine 40mg d30m; buspirone 10mg tw57ma day; amphetamine/dextroamphetamine 10mg twi69mdaily   . Connected with Dr Reddy forReece Levyal health support/ needs . Recommended continue current therapy and continue to f/u with Dr Reddy,   Reece Levyiabetes:  .       Currently controlled with no pharmacotherapy .       Diet: patient is trying to limit serving sizer. He has set a weigh goal of 215lbs, he currently is at 228lbs.  .       Exercise: no formal exercise but walks about 2 miles with his job daily per his fitness tracker .        Recommendations: continue to limit serving sizes and sugar intake .        Increase physical  activity to 150 minutes per week.   Chronic Pain: .      uncontrolled .  Current therapy - acetaminophen 542m up to 6 tabs per day as needed; Hydrocodone/APAP 5/3224mup to every 6 hours as needed (last filled #30 03/03/20); Tumeric 50053maily  . Patient states he just finished 5 day course of prednisone for back pain. Has follow up with Dr RamNelva Bushxt week. .       Interventions: Reminded patient to decreased number of acetaminophen tablets to no more than 4 per day if he needs to take Hydrocodone/APAP 5/325m75m     Continue to follow up with Dr RamoNelva Bushtient Goals/Self-Care Activities . Over the next 180 days, patient will:  take medications as prescribed, target a minimum of 150 minutes of moderate intensity exercise weekly, and engage in dietary modifications by limiting serving sizes and intake of sugar  Follow Up Plan: Telephone follow up appointment with care management team member scheduled for:  6 months Medication Assistance: None required.  Patient affirms current coverage meets needs. He is on 2 inhalers now - Advair HFA and Spiriva. Both are $47/ month copay. Discussed potential to use either Breztri (non formulary) or Trelegy which is triple therapy medication and would only be 1 copay of $47/month. Patient to discuss with pulmonology / allergist Dr WhelOrvil FeilTrelegy would be appropriate alternative to Advair HFA + Spiriva.  [id number] [id number]: [phone]: [phone] (MAIVeritas Collaborative [facility]ER) [place] -[id number]: [phone]: [phone] Up:  Patient agrees to Care Plan and Follow-up.  Plan:  Patient wanted to know if he could take CoEnzyme Q10. Counseled on ASCVD risk and reason for taking atorvastatin;  Ok to take OTC CoEnzyme Q10 100mg49mly if needed to prevent myalgia related to statin therapy; If continues to have myalgias, consider checking CPK.  Telephone follow up appointment with care management team member scheduled for:  3 months  TammyCherre RobinsrmD Clinical Pharmacist [facility]

## 2020-06-24 NOTE — Telephone Encounter (Signed)
Mr Engelmann, Would like a call back to discuss a medication he is thinking about taking in the future. Patient would like a call back on his cell phone   Cell Phone  [phone]

## 2020-06-25 DIAGNOSIS — M5416 Radiculopathy, lumbar region: Secondary | ICD-10-CM | POA: Diagnosis not present

## 2020-07-08 DIAGNOSIS — M5416 Radiculopathy, lumbar region: Secondary | ICD-10-CM | POA: Diagnosis not present

## 2020-08-11 DIAGNOSIS — M5416 Radiculopathy, lumbar region: Secondary | ICD-10-CM | POA: Diagnosis not present

## 2020-08-16 DIAGNOSIS — F900 Attention-deficit hyperactivity disorder, predominantly inattentive type: Secondary | ICD-10-CM | POA: Diagnosis not present

## 2020-08-16 DIAGNOSIS — F331 Major depressive disorder, recurrent, moderate: Secondary | ICD-10-CM | POA: Diagnosis not present

## 2020-08-22 ENCOUNTER — Other Ambulatory Visit: Payer: Self-pay | Admitting: Internal Medicine

## 2020-08-22 DIAGNOSIS — I10 Essential (primary) hypertension: Secondary | ICD-10-CM

## 2020-08-29 DIAGNOSIS — M5136 Other intervertebral disc degeneration, lumbar region: Secondary | ICD-10-CM | POA: Diagnosis not present

## 2020-08-29 DIAGNOSIS — M5416 Radiculopathy, lumbar region: Secondary | ICD-10-CM | POA: Diagnosis not present

## 2020-08-29 DIAGNOSIS — M48061 Spinal stenosis, lumbar region without neurogenic claudication: Secondary | ICD-10-CM | POA: Diagnosis not present

## 2020-08-30 DIAGNOSIS — L570 Actinic keratosis: Secondary | ICD-10-CM | POA: Diagnosis not present

## 2020-08-30 DIAGNOSIS — L820 Inflamed seborrheic keratosis: Secondary | ICD-10-CM | POA: Diagnosis not present

## 2020-08-30 DIAGNOSIS — C44529 Squamous cell carcinoma of skin of other part of trunk: Secondary | ICD-10-CM | POA: Diagnosis not present

## 2020-09-12 DIAGNOSIS — M25512 Pain in left shoulder: Secondary | ICD-10-CM | POA: Diagnosis not present

## 2020-09-21 ENCOUNTER — Other Ambulatory Visit: Payer: Self-pay | Admitting: Internal Medicine

## 2020-10-27 DIAGNOSIS — L821 Other seborrheic keratosis: Secondary | ICD-10-CM | POA: Diagnosis not present

## 2020-10-27 DIAGNOSIS — D692 Other nonthrombocytopenic purpura: Secondary | ICD-10-CM | POA: Diagnosis not present

## 2020-11-08 DIAGNOSIS — Z0000 Encounter for general adult medical examination without abnormal findings: Secondary | ICD-10-CM | POA: Diagnosis not present

## 2020-11-09 ENCOUNTER — Other Ambulatory Visit: Payer: Self-pay | Admitting: Internal Medicine

## 2020-11-30 ENCOUNTER — Other Ambulatory Visit: Payer: Self-pay | Admitting: Internal Medicine

## 2020-11-30 DIAGNOSIS — I10 Essential (primary) hypertension: Secondary | ICD-10-CM

## 2020-12-12 ENCOUNTER — Ambulatory Visit (INDEPENDENT_AMBULATORY_CARE_PROVIDER_SITE_OTHER): Payer: Medicare (Managed Care) | Admitting: Pharmacist

## 2020-12-12 DIAGNOSIS — J4520 Mild intermittent asthma, uncomplicated: Secondary | ICD-10-CM

## 2020-12-12 DIAGNOSIS — E785 Hyperlipidemia, unspecified: Secondary | ICD-10-CM

## 2020-12-12 DIAGNOSIS — I10 Essential (primary) hypertension: Secondary | ICD-10-CM

## 2020-12-12 DIAGNOSIS — R739 Hyperglycemia, unspecified: Secondary | ICD-10-CM

## 2020-12-12 NOTE — Chronic Care Management (AMB) (Signed)
Chronic Care Management Pharmacy Note  12/12/2020 Name:  Kirk Sutton MRN:  [id number] DOB:  12-17-1950  Summary: Continues to have back pain - seeing Dr Nelva Bush Due to have annual flu vaccine, updated COVID and Shingrix   Recommendations/Changes made from today's visit:  Subjective: Kirk Sutton is an 70 y.o. year old male who is a primary patient of Paz, Alda Berthold, MD.  The CCM team was consulted for assistance with disease management and care coordination needs.    Engaged with patient by telephone for follow up visit in response to provider referral for pharmacy case management and/or care coordination services.   Consent to Services:  The patient was given information about Chronic Care Management services, agreed to services, and gave verbal consent prior to initiation of services.  Please see initial visit note for detailed documentation.   Patient Care Team: Colon Branch, MD as PCP - General Thomasene Mohair Verner Chol., MD as Consulting Physician (Urology) Calvert Cantor, MD as Consulting Physician (Ophthalmology) Orbie Hurst, MD as Consulting Physician (Dermatology) Gardiner Barefoot, DPM as Consulting Physician (Podiatry) Roseanne Kaufman, MD as Consulting Physician (Orthopedic Surgery) Ricard Dillon, MD (Psychiatry) Suella Broad, MD as Consulting Physician (Physical Medicine and Rehabilitation) Tiajuana Amass, MD as Referring Physician (Allergy and Immunology) Cherre Robins, PharmD (Pharmacist)  Recent office visits: None since last Chronic Care Management visit  Recent consult visits: 12/12/2020 - Ortho (Dr Amedeo Plenty) unable to view full note  Hospital visits: None in previous 6 months  Objective:  Lab Results  Component Value Date   CREATININE 1.18 04/26/2020   CREATININE 1.25 (H) 12/23/2019   CREATININE 1.20 12/22/2018    Lab Results  Component Value Date   HGBA1C 6.2 04/26/2020   Last diabetic Eye exam:  Lab Results  Component Value Date/Time    HMDIABEYEEXA normal 10/17/2008 12:00 AM    Last diabetic Foot exam:  Lab Results  Component Value Date/Time   HMDIABFOOTEX yes 02/16/2009 12:00 AM        Component Value Date/Time   CHOL 105 02/10/2020 1310   TRIG 65.0 02/10/2020 1310   TRIG 52 02/20/2006 1053   HDL 61.90 02/10/2020 1310   CHOLHDL 2 02/10/2020 1310   VLDL 13.0 02/10/2020 1310   LDLCALC 31 02/10/2020 1310   LDLCALC 83 12/23/2019 0828    Hepatic Function Latest Ref Rng & Units 02/10/2020 12/23/2019 12/22/2018  Total Protein 6.1 - 8.1 g/dL - 6.1 6.4  Albumin 3.5 - 5.2 g/dL - - 4.2  AST 0 - 37 U/L _0 ALT 0 - 53 U/L _1 Alk Phosphatase 39 - 117 U/L - - 66  Total Bilirubin 0.2 - 1.2 mg/dL - 0.9 1.0  Bilirubin, Direct 0.0 - 0.3 mg/dL - - -    Lab Results  Component Value Date/Time   TSH 1.25 12/22/2018 01:33 PM   TSH 1.29 11/20/2017 08:40 AM    CBC Latest Ref Rng & Units 12/23/2019 12/22/2018 11/20/2017  WBC 3.8 - 10.8 Thousand/uL 5.5 8.4 5.7  Hemoglobin 13.2 - 17.1 g/dL 12.9(L) 13.5 13.0  Hematocrit 38.5 - 50.0 % 39.0 40.2 38.3(L)  Platelets 140 - 400 Thousand/uL 139(L) 151.0 132.0(L)    No results found for: VD25OH  Clinical ASCVD: No  The ASCVD Risk score (Arnett DK, et al., 2019) failed to calculate for the following reasons:   The valid total cholesterol range is 130 to 320 mg/dL     Social History   Tobacco Use  Smoking Status Never  Smokeless Tobacco Never   BP Readings from Last 3 Encounters:  05/25/20 130/70  04/26/20 (!) 155/90  03/31/20 (!) 168/102   Pulse Readings from Last 3 Encounters:  05/25/20 (!) 57  04/26/20 (!) 57  12/23/19 (!) 52   Wt Readings from Last 3 Encounters:  05/03/20 231 lb (104.8 kg)  05/02/20 231 lb (104.8 kg)  04/26/20 231 lb (104.8 kg)    Assessment: Review of patient past medical history, allergies, medications, health status, including review of consultants reports, laboratory and other test data, was performed as part of comprehensive  evaluation and provision of chronic care management services.   SDOH:  (Social Determinants of Health) assessments and interventions performed:  SDOH Interventions    Flowsheet Row Most Recent Value  SDOH Interventions   Financial Strain Interventions Intervention Not Indicated  Physical Activity Interventions [employer]  [encouraged patient to restart going to [place]  Allergies  Allergen Reactions   Cetirizine Hcl     REACTION: prostatitis   Fexofenadine     REACTION: causes prostatitis    Medications Reviewed Today     Reviewed by Cherre Robins, PharmD (Pharmacist) on 12/12/20 at [facility] List Status: <None>   Medication Order Taking? Sig Documenting Provider Last Dose Status Informant  acetaminophen (TYLENOL) 500 MG tablet [id number] Yes Take 500 mg by mouth daily as needed for moderate pain. [provider] Taking Active            Med Note Alinda Money, [place] Dec 05, 2015  8:52 AM) PRN  albuterol (VENTOLIN HFA) 108 (90 Base) MCG/ACT inhaler [id number] Yes INHALE 2 PUFFS INTO THE LUNGS EVERY 6 HOURS AS NEEDED FOR WHEEZING OR SHORTNESS OF BREATH Colon Branch, MD Taking Active   amphetamine-dextroamphetamine (ADDERALL) 10 MG tablet [id number] Yes Take 10 mg by mouth daily with breakfast. [provider] Taking Active            Med Note Antony Contras, [place] Jun 08, 2020  9:40 AM) Taking 1/2 tablet each morning.  Ascorbic Acid (VITAMIN C) 1000 MG tablet [id number] Yes Take 1,000 mg by mouth daily. [provider] Taking Active   atorvastatin (LIPITOR) 20 MG tablet [id number] Yes Take 1 tablet (20 mg total) by mouth at bedtime. Colon Branch, MD Taking Active   azelastine (ASTELIN) 0.1 % nasal spray [id number] Yes Place 1 spray into both nostrils every evening. [provider] Taking Active   busPIRone (BUSPAR) 10 MG tablet [id number] Yes Take 10 mg by mouth 2 (two) times daily. [provider] Taking Active    clobetasol ointment (TEMOVATE) 0.05 % [id number] Yes Apply 1 application topically 2 (two) times daily as needed. For hands [provider] Taking Active   Coenzyme Q10 100 MG capsule [id number] Yes Take 100 mg by mouth daily. [provider] Taking Active   desloratadine (CLARINEX) 5 MG tablet [id number] Yes Take 5 mg by mouth daily. [provider] Taking Active   dextromethorphan-guaiFENesin [facility] DM) 30-600 MG 12hr tablet [id number] Yes Take 1 tablet by mouth 2 (two) times daily. [provider] Taking Active   FLUoxetine (PROZAC) 40 MG capsule [id number] Yes Take 1 tablet daily [provider] Taking Active            Med Note (CANTER, Cheri Rous   Tue Apr 12, 2015  2:47 PM)    Fluticasone Furoate [facility] SENSIMIST  NA) [id number] Yes Place into the nose daily as needed. [provider] Taking Active Self  fluticasone-salmeterol (ADVAIR HFA) 109-32 MCG/ACT inhaler [id number] Yes Inhale 2 puffs into the lungs in the morning and at bedtime. [provider] Taking Active   hydrochlorothiazide (HYDRODIURIL) 25 MG tablet [id number] Yes TAKE 1 TABLET(25 MG) BY MOUTH DAILY Colon Branch, MD Taking Active   HYDROcodone-acetaminophen (NORCO/VICODIN) 5-325 MG tablet [id number] Yes Take 1 tablet by mouth 3 (three) times daily as needed. [provider] Taking Active            Med Note De Blanch   Tue Mar 08, 2020  9:18 AM) Per ortho  Magnesium Gluconate 250 MG TABS [id number] Yes Take 400 mg by mouth daily. [provider] Taking Active   metoprolol succinate (TOPROL-XL) 50 MG 24 hr tablet [id number] Yes TAKE 1 TABLET(50 MG) BY MOUTH DAILY Paz, Alda Berthold, MD Taking Active   montelukast (SINGULAIR) 10 MG tablet [id number] Yes Take 1 tablet (10 mg total) by mouth at bedtime. Colon Branch, MD Taking Active   OVER THE COUNTER MEDICATION [id number] Yes Take 1 each by mouth daily as needed. CBD Gummy - for thumb / hand pain [provider] Taking Active   PAZEO 0.7 % SOLN [id number] Yes Place 1 drop into both eyes as needed. [provider] Taking Active            Med Note Ileene Patrick, Vilma Prader C   Wed Feb 23, 2015 10:50 AM) PRN  pyridOXINE (VITAMIN B-6) 100 MG tablet [id number] Yes Take 400 mg by mouth daily. Super B complex [provider] Taking Active Self  terazosin (HYTRIN) 1 MG capsule [id number] Yes Take 3 capsules (3 mg total) by mouth daily. Colon Branch, MD Taking Active   Turmeric 500 MG CAPS [id number] Yes Take 500 mg by mouth daily.  [provider] Taking Active   Vitamin D, Cholecalciferol, 25 MCG (1000 UT) CAPS [id number] Yes Take 1 capsule by mouth daily. [provider] Taking Active             Patient Active Problem List   Diagnosis Date Noted   Chronic back pain 04/30/2018   DJD (degenerative joint disease) 05/22/2017   Snoring 04/03/2016   PCP NOTES >>>>> 02/24/2015   Pain managment 03/08/2014   Anxiety state 07/15/2012   Annual physical exam 07/20/2010   Hyperglycemia 08/11/2008   PROSTATITIS, CHRONIC 03/31/2008   Dyslipidemia 10/13/2007   ALLERGIC RHINITIS 10/17/2006   Essential hypertension 04/09/2006   Extrinsic asthma 04/09/2006    Immunization History  Administered Date(s) Administered   Fluad Quad(high Dose 65+) 12/22/2018   H1N1 03/31/2008   Influenza Split 01/22/2011, 01/10/2012   Influenza Whole 01/17/2007, 12/24/2007, 11/17/2009   Influenza, High Dose Seasonal PF 02/23/2015, 01/13/2016, 01/02/2017, 01/16/2018   Influenza,inj,Quad PF,6+ Mos 01/20/2013, 03/08/2014, 12/23/2019   PFIZER(Purple Top)SARS-COV-2 Vaccination 05/21/2019, 07/13/2019, 06/01/2020   Pneumococcal Conjugate-13 09/03/2014   Pneumococcal Polysaccharide-23 09/01/2013   Td 03/19/2001   Tdap 08/22/2011   Zoster, Live 08/22/2011    Conditions to be addressed/monitored: HTN, HLD, Anxiety, Depression, and pre diabetes; chronic back pain  Care Plan : General Pharmacy  (Adult)  Updates made by Cherre Robins, PHARMD since 12/12/2020 12:00 AM     Problem: Medication Adherence (Wellness)   Priority: High  Note:   Current Barriers:  Does not contact provider office for questions/concerns Optimization of medication therapy needed with regards to efficacy and cost  Pharmacist Clinical Goal(s):  Over  the next 180 days, patient will adhere to plan to optimize therapeutic regimen for asthma, HTN and anxiety / depression as evidenced by report of adherence to recommended medication management changes through collaboration with PharmD and provider.   Interventions: 1:1 collaboration with Colon Branch, MD regarding development and update of comprehensive plan of care as evidenced by provider attestation and co-signature Inter-disciplinary care team collaboration (see longitudinal plan of care) Comprehensive medication review performed; medication list updated in electronic medical record  Hypertension: Variable controlled; blood pressure goal <140/90  BP Readings from Last 3 Encounters:  05/25/20 130/70  04/26/20 (!) 155/90  03/31/20 (!) 168/102  Current treatment:  metoprolol succinate 58m daily Hydrochlorothiazide 275mdaily Current home readings: patient reports he has not been checking blood pressure at home recently Three months [place] helping to manage the new company. Lots of traveling. Denies hypotensive/hypertensive symptoms Interventions:  Counseled on BP goal of <130/80;  Recommended check blood pressure  2 to 3 times per week and record   Continue current blood pressure medications. Will see PCP next week, If blood pressure elevated above goal, consider increasing metoprolol to 10050maily  Hyperlipidemia: Controlled; LDL goal <100 current treatment:  Atorvastatin 47m92mily Co Enzyme Q10 - 100mg6mly   Medications previously tried: none  Current dietary patterns: Patient  reports he is trying to limit portion sizes but on the road a lot Exercise[facility] [employer] [facility] not been going consistently due to work travel.  Interventions:  Counseled on ASCVD risk and reason for taking atorvastatin ;  Continue atorvastatin and CoEnzyme Q10 100mg 47my  Encouraged patient to restart exercise with goal of at least 150 minutes per week   Asthma / Allergies: Improving;  Monitored by Pulmonary - Megan Jean Rosenthalts he uses albuterol 1 or 2 times per week.  Current Therapy:  Advair HFA inhaler 115/21mcg 32mhaler 2 puffs into lungs twice daily (maintenance inhaler)  Albuterol inhaler - inhaler 2 puffs into lung up to every 6 hours as needed for wheezing or shortness of breath Spiriva once daily (not currently taking due to cost)  Astelin nasal spray - use 1 spray in each nostril once a day in the evening Flonase nasal spray - use 1 spray in each nostril as needed for allergies Desloratadine 5mg - t27m 1 tablet daily benzonatate 100mg up 2m times a day if needed for cough Montelukast 10mg dail73mch evening. Interventions:   Reviewed above medications and discussed dosing Continue to follow up with pulmonology  Depression/Anxiety  Controlled;  current treatment:  Fluoxetine 40mg daily30mpirone 10mg twice 36my amphetamine/dextroamphetamine 10mg daily (64ment only taking 0.5 tablet daily, sometimes 2nd dose if needed in afternoon)  Connected with Dr Reddy for menReece Levyealth support/ needs Interventions:  Recommended continue current therapy and continue to f/u with Dr Reddy ,   PreReece Levyetes:        Currently controlled with no pharmacotherapy       Diet: patient is trying to limit serving sizer. He has set a weigh goal of 215lbs, he currently is at 228lbs.        Exercise: no formal exercise but walks about 2 miles with his job daily per his fitness tracker Interventions:       Recommendations: continue to limit serving sizes and sugar intake Increase  physical activity to 150 minutes per week.   Chronic Back Pain: Not controlled  Monitored by Dr Nelva Bush Received 3 injections to back with a little help.  Has had 1 session of acupuncture and plans 2nd this week. Patient reports little change after first session.  Current therapy: acetaminophen 550m up to 6 tabs per day as needed;  Hydrocodone/APAP 5/3241mup to every 6 hours as needed (last filled #30 03/03/20);  Tumeric 50040maily  Interventions:  Reminded patient to limit amount of acetaminophen to 3000m72mr day. Reminded him that Hydrocodone/APAP 5/325mg6mtains 325mg 21mcetaminophen Continue to follow up with Dr Ramos Nelva Bushth maintenance:  Reviewed refill history Recommended received annual flu vaccine, updated COVID vaccine and Shingrix series Patient will get flu vaccine in office next week and is considering COVID and Shingrix vaccines.   Patient Goals/Self-Care Activities Over the next 180 days, patient will:  take medications as prescribed, target a minimum of 150 minutes of moderate intensity exercise weekly, and engage in dietary modifications by limiting serving sizes and intake of sugar  Follow Up Plan: Telephone follow up appointment with care management team member scheduled for:  3 to 6 months      Goal: Medication Adherence Maintained   This Visit's Progress: On track  Note:   Reviewed all medications to determine if patient or caregiver knows why the medications are given and if taken as prescribed.  Completed a medication adherence assessment including barriers to medication adherence.  Assessed barriers to medication adherence.  Assessed presence of side effects; provide suggestions to manage or reduce side effects.     Task: Optimize Medication Use   Note:   Care Management Activities:    - barriers to medication adherence identified - counseling by pharmacist provided - medication list reviewed - medication-adherence assessment completed -  self-management plan initiated or updated - understanding of current medications assessed        Medication Assistance: None required.  Patient affirms current coverage meets needs.  [facility]  [id number] [facility] [place] [place] [place]-[address]: [phone] (MAIL [facility]) [place] [address]-[address]: [phone] Up:  Patient agrees to Care Plan and Follow-up.  Plan: Telephone follow up appointment with care management team member scheduled for:  4 to 5 months  Latishia Suitt Cherre RobinsmD Clinical Pharmacist [place]ann[place]oberly[facility]Unity[facility]

## 2020-12-12 NOTE — Patient Instructions (Signed)
Visit Information  PATIENT GOALS:  Goals Addressed             This Visit's Progress    Chronic Care Management Pharmacy Care Plan   On track    CARE PLAN ENTRY (see longitudinal plan of care for additional care plan information)  Current Barriers:  Chronic Disease Management support, education, and care coordination needs related to Asthma, Pre-Diabetes, Hypertension, Hyperlipidemia, Anxiety, BPH, Allergies   Hypertension BP Readings from Last 3 Encounters:  05/25/20 130/70  04/26/20 (!) 155/90  03/31/20 (!) 168/102  Pharmacist Clinical Goal(s): Over the next 90 days, patient will work with PharmD and providers to achieve BP goal <130/80 Current regimen:  Hydrochlorothiazide  25mg  daily each morning Metoprolol succinate 50mg  daily at bedtime Interventions: Check blood pressure 1 to 2 times per week. Counseled on BP goal of <130/80;  Patient self care activities - Over the next 90 days, patient will: Check blood pressure 2 to 3 times  per week and provide at future appointments Ensure daily salt intake < 2300 mg/day  Hyperlipidemia/ASCVD Risk Lab Results  Component Value Date/Time   LDLCALC 31 02/10/2020 01:10 PM   [place] 83 12/23/2019 08:28 AM  Pharmacist Clinical Goal(s): Over the next 90 days, patient will work with PharmD and providers to maintain LDL goal < 100 Current regimen:  Atorvastatin 20mg  daily Co Enzyme Q10 - 100mg  daily     Interventions: Encouraged patient to restart exercise with goal of at least 150 minutes per week  Patient self care activities - Over the next 90 days, patient will: Continue atorvastatin and CoEnzyme Q10 100mg  daily  Pre-Diabetes Lab Results  Component Value Date/Time   HGBA1C 6.2 04/26/2020 08:28 AM   HGBA1C 5.5 12/23/2019 08:28 AM  Pharmacist Clinical Goal(s): Over the next 90 days, patient will work with PharmD and providers to maintain A1c goal <6.5% Current regimen:  Diet and exercise management    Interventions: Discussed importance exercise Patient self care activities - Over the next 90 days, patient will: Continue to limit sugar. Increase intake of vegetables - especially green leafy vegetables.  Goal for exercise is at least 150 minutes per week.  Maintain a1c less than 6.5%  Asthma / Allergies:  Pharmacist Clinical Goal(s): Over the next 90 days, patient will work with PharmD and providers to improve breathing and allergy symptoms Current regimen:  Albuterol inhaler as needed for wheezing or shortness of breath Advair HFA 115/21 inhale 2 puffs twice a day Astelin nasal spray as needed Flonase nasal spray as needed Montelukast 10mg  daily at night  Desloratidine 5mg  daily  Pazeo Solution - 1 drop in both eyes as needed Interventions: Reviewed above medications and dosing Patient self care activities - Over the next 90 days, patient will: Use maintenance inhalers daily - Advair  Health maintenance:  Interventions: Reviewed refill history Recommended received annual flu vaccine, updated COVID vaccine and Shingrix series Patient self care activities - Over the next 90 days, patient will: Get flu vaccine in office next week Also consider updated COVID and Shingrix vaccines.    Medication management Pharmacist Clinical Goal(s): Over the next 90 days, patient will work with PharmD and providers to maintain optimal medication adherence Current pharmacy: [facility] Interventions Comprehensive medication review performed. Continue current medication management strategy Patient self care activities - Over the next 90 days, patient will: Focus on medication adherence by filling and taking medications appropriately  Take medications as prescribed Report any questions or concerns to PharmD and/or provider(s)  Please see  past updates related to this goal by clicking on the "Past Updates" button in the selected goal          Patient verbalizes understanding of  instructions provided today and agrees to view in [place].   Telephone follow up appointment with care management team member scheduled for: January 2023 with pharmacis  Cherre Robins, PharmD Clinical Pharmacist Lorena Otoe [facility]

## 2020-12-16 DIAGNOSIS — E785 Hyperlipidemia, unspecified: Secondary | ICD-10-CM

## 2020-12-16 DIAGNOSIS — J4520 Mild intermittent asthma, uncomplicated: Secondary | ICD-10-CM

## 2020-12-16 DIAGNOSIS — I10 Essential (primary) hypertension: Secondary | ICD-10-CM | POA: Diagnosis not present

## 2020-12-20 ENCOUNTER — Ambulatory Visit (INDEPENDENT_AMBULATORY_CARE_PROVIDER_SITE_OTHER): Payer: Medicare (Managed Care) | Admitting: Internal Medicine

## 2020-12-20 ENCOUNTER — Other Ambulatory Visit: Payer: Self-pay

## 2020-12-20 ENCOUNTER — Encounter: Payer: Self-pay | Admitting: Internal Medicine

## 2020-12-20 VITALS — BP 132/84 | HR 78 | Temp 98.4°F | Resp 14 | Ht 71.0 in | Wt 233.4 lb

## 2020-12-20 DIAGNOSIS — R739 Hyperglycemia, unspecified: Secondary | ICD-10-CM | POA: Diagnosis not present

## 2020-12-20 DIAGNOSIS — Z0189 Encounter for other specified special examinations: Secondary | ICD-10-CM

## 2020-12-20 DIAGNOSIS — I10 Essential (primary) hypertension: Secondary | ICD-10-CM | POA: Diagnosis not present

## 2020-12-20 DIAGNOSIS — R0683 Snoring: Secondary | ICD-10-CM | POA: Diagnosis not present

## 2020-12-20 DIAGNOSIS — E119 Type 2 diabetes mellitus without complications: Secondary | ICD-10-CM | POA: Diagnosis not present

## 2020-12-20 DIAGNOSIS — Z0000 Encounter for general adult medical examination without abnormal findings: Secondary | ICD-10-CM

## 2020-12-20 DIAGNOSIS — E785 Hyperlipidemia, unspecified: Secondary | ICD-10-CM | POA: Diagnosis not present

## 2020-12-20 DIAGNOSIS — Z0001 Encounter for general adult medical examination with abnormal findings: Secondary | ICD-10-CM | POA: Diagnosis not present

## 2020-12-20 DIAGNOSIS — Z23 Encounter for immunization: Secondary | ICD-10-CM

## 2020-12-20 LAB — TSH: TSH: 1.52 u[IU]/mL (ref 0.35–5.50)

## 2020-12-20 LAB — CBC WITH DIFFERENTIAL/PLATELET
Basophils Absolute: 0 10*3/uL (ref 0.0–0.1)
Basophils Relative: 0.5 % (ref 0.0–3.0)
Eosinophils Absolute: 0.1 10*3/uL (ref 0.0–0.7)
Eosinophils Relative: 2.5 % (ref 0.0–5.0)
HCT: 38.4 % — ABNORMAL LOW (ref 39.0–52.0)
Hemoglobin: 12.9 g/dL — ABNORMAL LOW (ref 13.0–17.0)
Lymphocytes Relative: 25.5 % (ref 12.0–46.0)
Lymphs Abs: 1.5 10*3/uL (ref 0.7–4.0)
MCHC: 33.6 g/dL (ref 30.0–36.0)
MCV: 96.6 fl (ref 78.0–100.0)
Monocytes Absolute: 0.5 10*3/uL (ref 0.1–1.0)
Monocytes Relative: 9.1 % (ref 3.0–12.0)
Neutro Abs: 3.6 10*3/uL (ref 1.4–7.7)
Neutrophils Relative %: 62.4 % (ref 43.0–77.0)
Platelets: 138 10*3/uL — ABNORMAL LOW (ref 150.0–400.0)
RBC: 3.97 Mil/uL — ABNORMAL LOW (ref 4.22–5.81)
RDW: 13.1 % (ref 11.5–15.5)
WBC: 5.7 10*3/uL (ref 4.0–10.5)

## 2020-12-20 LAB — COMPREHENSIVE METABOLIC PANEL
ALT: 18 U/L (ref 0–53)
AST: 22 U/L (ref 0–37)
Albumin: 4.1 g/dL (ref 3.5–5.2)
Alkaline Phosphatase: 72 U/L (ref 39–117)
BUN: 17 mg/dL (ref 6–23)
CO2: 32 mEq/L (ref 19–32)
Calcium: 9.1 mg/dL (ref 8.4–10.5)
Chloride: 100 mEq/L (ref 96–112)
Creatinine, Ser: 1.16 mg/dL (ref 0.40–1.50)
GFR: 62.83 mL/min (ref >60.00)
Glucose, Bld: 113 mg/dL — ABNORMAL HIGH (ref 70–99)
Potassium: 3.7 mEq/L (ref 3.5–5.1)
Sodium: 139 mEq/L (ref 135–145)
Total Bilirubin: 1.1 mg/dL (ref 0.2–1.2)
Total Protein: 6.1 g/dL (ref 6.0–8.3)

## 2020-12-20 LAB — LIPID PANEL
Cholesterol: 100 mg/dL (ref 0–200)
HDL: 51.5 mg/dL (ref >39.00)
LDL Cholesterol: 34 mg/dL (ref 0–99)
NonHDL: 48.85
Total CHOL/HDL Ratio: 2
Triglycerides: 73 mg/dL (ref 0.0–149.0)
VLDL: 14.6 mg/dL (ref 0.0–40.0)

## 2020-12-20 LAB — HEMOGLOBIN A1C: Hgb A1c MFr Bld: 6.2 % (ref 4.6–6.5)

## 2020-12-20 NOTE — Progress Notes (Signed)
Subjective:    Patient ID: Kirk Sutton, male    DOB: [birth year], 70 y.o.   MRN: [id number]  DOS:  12/20/2020 Type of visit - description: CPX  Since the last office visit is doing okay. Asthma: Under better control, still needs episodic albuterol Has back pain, under the care of Ortho, still bothersome. Still snores, denies fatigue but admits to feeling sleepy  Review of Systems  Other than above, a 14 point review of systems is negative      Past Medical History:  Diagnosis Date   Allergic rhinitis    Asthma    Chronic prostatitis    sees urology   Diabetes mellitus    type 11 (a1c 6.0 03/2008)/no meds.   Hernia, umbilical    Hyperlipidemia    Hypertension    Psoriasis    @ hands, sees derm    Past Surgical History:  Procedure Laterality Date   APPENDECTOMY     FERTILITY SURGERY  1979   HERNIA REPAIR     NASAL SEPTUM SURGERY  2009   SHOULDER SURGERY  2008   left   TESTICLE SURGERY     undescended repair    Social History   Socioeconomic History   Marital status: Married    Spouse name: Not on file   Number of children: 2   Years of education: Not on file   Highest education level: Not on file  Occupational History   Occupation: Administrator, arts  Tobacco Use   Smoking status: Never   Smokeless tobacco: Never  Vaping Use   Vaping Use: Never used  Substance and Sexual Activity   Alcohol use: Yes    Comment: socially, rare   Drug use: No   Sexual activity: Yes  Other Topics Concern   Not on file  Social History Narrative   Household-- pt, wife    Social Determinants of Health   Financial Resource Strain: Low Risk    Difficulty of Paying Living Expenses: Not very hard  Food Insecurity: No Food Insecurity   Worried About Charity fundraiser in the Last Year: Never true   [place] in the Last Year: Never true  Transportation Needs: No Transportation Needs   Lack of Transportation (Medical): No   Lack of  Transportation (Non-Medical): No  Physical Activity: Insufficiently Active   Days of Exercise per Week: 1 day   Minutes of Exercise per Session: 20 min  Stress: No Stress Concern Present   Feeling of Stress : Not at all  Social Connections: Moderately Integrated   Frequency of Communication with Friends and Family: More than three times a week   Frequency of Social Gatherings with Friends and Family: More than three times a week   Attends Religious Services: More than 4 times per year   Active Member of [employer] or Organizations: No   Attends Archivist Meetings: Never   Marital Status: Married  Human resources officer Violence: Not At Risk   Fear of Current or Ex-Partner: No   Emotionally Abused: No   Physically Abused: No   Sexually Abused: No     Allergies as of 12/20/2020       Reactions   Cetirizine Hcl    REACTION: prostatitis   Fexofenadine    REACTION: causes prostatitis        Medication List        Accurate as of December 20, 2020 11:59 PM. If you have any  questions, ask your nurse or doctor.          Advair HFA 115-21 MCG/ACT inhaler Generic drug: fluticasone-salmeterol Inhale 2 puffs into the lungs in the morning and at bedtime.   albuterol 108 (90 Base) MCG/ACT inhaler Commonly known as: VENTOLIN HFA INHALE 2 PUFFS INTO THE LUNGS EVERY 6 HOURS AS NEEDED FOR WHEEZING OR SHORTNESS OF BREATH   amphetamine-dextroamphetamine 10 MG tablet Commonly known as: ADDERALL Take 5 mg by mouth daily with breakfast.   atorvastatin 20 MG tablet Commonly known as: LIPITOR Take 1 tablet (20 mg total) by mouth at bedtime.   azelastine 0.1 % nasal spray Commonly known as: ASTELIN Place 1 spray into both nostrils every evening.   busPIRone 10 MG tablet Commonly known as: BUSPAR Take 10 mg by mouth 2 (two) times daily.   clobetasol ointment 0.05 % Commonly known as: TEMOVATE Apply 1 application topically 2 (two) times daily as needed. For hands   Coenzyme Q10  100 MG capsule Take 100 mg by mouth daily.   desloratadine 5 MG tablet Commonly known as: CLARINEX Take 5 mg by mouth daily.   dextromethorphan-guaiFENesin 30-600 MG 12hr tablet Commonly known as: MUCINEX DM Take 1 tablet by mouth 2 (two) times daily.   FLONASE SENSIMIST NA Place into the nose daily as needed.   FLUoxetine 40 MG capsule Commonly known as: PROZAC Take 1 tablet daily   hydrochlorothiazide 25 MG tablet Commonly known as: HYDRODIURIL TAKE 1 TABLET(25 MG) BY MOUTH DAILY   HYDROcodone-acetaminophen 5-325 MG tablet Commonly known as: NORCO/VICODIN Take 1 tablet by mouth 3 (three) times daily as needed.   Magnesium Gluconate 250 MG Tabs Take 400 mg by mouth daily.   metoprolol succinate 50 MG 24 hr tablet Commonly known as: TOPROL-XL TAKE 1 TABLET(50 MG) BY MOUTH DAILY   montelukast 10 MG tablet Commonly known as: SINGULAIR Take 1 tablet (10 mg total) by mouth at bedtime.   OVER THE COUNTER MEDICATION Take 1 each by mouth daily as needed. CBD Gummy - for thumb / hand pain   Pazeo 0.7 % Soln Generic drug: Olopatadine HCl Place 1 drop into both eyes as needed.   pyridOXINE 100 MG tablet Commonly known as: VITAMIN B-6 Take 400 mg by mouth daily. Super B complex   terazosin 1 MG capsule Commonly known as: HYTRIN Take 3 capsules (3 mg total) by mouth daily.   Turmeric 500 MG Caps Take 500 mg by mouth daily.   Tylenol 8 Hour Arthritis Pain 650 MG CR tablet Generic drug: acetaminophen Take 650 mg by mouth every 8 (eight) hours as needed for pain. What changed: Another medication with the same name was removed. Continue taking this medication, and follow the directions you see here. Changed by: Kathlene November, MD   vitamin C 1000 MG tablet Take 1,000 mg by mouth daily.   Vitamin D (Cholecalciferol) 25 MCG (1000 UT) Caps Take 1 capsule by mouth daily.           Objective:   Physical Exam BP 132/84 (BP Location: Left Arm, Patient Position: Sitting,  Cuff Size: Normal)   Pulse 78   Temp 98.4 F (36.9 C) (Oral)   Resp 14   Ht 5\' 11"  (1.803 m)   Wt 233 lb 6 oz (105.9 kg)   SpO2 96%   BMI 32.55 kg/m  General: Well developed, NAD, BMI noted Neck: No  thyromegaly  HEENT:  Normocephalic . Face symmetric, atraumatic Lungs:  CTA B Normal respiratory effort, no  intercostal retractions, no accessory muscle use. Heart: RRR,  no murmur.  Abdomen:  Not distended, soft, non-tender. No rebound or rigidity.  Reducible umbilical hernia, about 1 inch in diameter Lower extremities: no pretibial edema bilaterally  Skin: Exposed areas without rash. Not pale. Not jaundice Neurologic:  alert & oriented X3.  Speech normal, gait appropriate for age and unassisted Strength symmetric and appropriate for age.  Psych: Cognition and judgment appear intact.  Cooperative with normal attention span and concentration.  Behavior appropriate. No anxious or depressed appearing.     Assessment    Assessment Prediabetes  HTN Hyperlipidemia Anxiety Dr Fay Records (psych) Rx prozac ~12-2014  Cough variant asthma, atopic dermatitis, allergic rhinitis and allergic conjunctivitis. MSK:  --Chronic back pain, pain medication as needed, used to see Dr Nelva Bush, did PT @ [phone], better --DJD- hands Chronic prostatitis, sees urology Psoriasis, hands, sees dermatology Snoring (severe per pt)- dentist Rx a moth guard ~ 08-2015, helps  PLAN: Here for CPX Prediabetes: Check A1c HTN: Well-controlled here, reports BPs are okay at home when he checks (rarely). No change, check BPs weekly, continue HCTZ, metoprolol.  Check labs. Hyperlipidemia: On Lipitor, labs. Cough variant asthma: Symptoms improved since the last visit. DJD, MSK: Ongoing back pain, follow-up by Ortho. Snoring: Continue with the snoring, his dentist prescribed a mouthguard before, Epworth scale scored 11 which is +.  OSA risks discussed, refer to neurology. Umbilical hernia: Reducible,  urology previously recommended a surgical eval, I offered the same today: pt  declined for now.  Red flag symptoms discussed. 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   RTC 6 months   In addition to CPX, we addressed all his medical problems including snoring that required a referral.    This visit occurred during the SARS-CoV-2 public health emergency.  Safety protocols were in place, including screening questions prior to the visit, additional usage of staff PPE, and extensive cleaning of exam room while observing appropriate contact time as indicated for disinfecting solutions.

## 2020-12-20 NOTE — Patient Instructions (Addendum)
Recommend to proceed with the following vaccines at your pharmacy:  Shingrix (shingles) Covid #4  You are due for your repeat colonoscopy with Dr. Havery Moros, please call his office at [phone] to get on his schedule.  Check your blood pressure weekly BP GOAL is between 110/65 and  135/85. If it is consistently higher or lower, let me know  We are referring you to neurology for evaluation of his sleep apnea   GO TO THE LAB : Get the blood work     [place], [place] back for a checkup in 6 months    "Living will", "[place] of attorney": Advanced care planning  (If you already have a living will or healthcare power of attorney, please bring the copy to be scanned in your chart.)  Advance care planning is a process that supports adults in  understanding and sharing their preferences regarding future medical care.   The patient's preferences are recorded in documents called Advance Directives.    Advanced directives are completed (and can be modified at any time) while the patient is in full mental capacity.   The documentation should be available at all times to the patient, the family and the healthcare providers.  Bring in a copy to be scanned in your chart is an excellent idea and is recommended   This legal documents direct treatment decision making and/or appoint a surrogate to make the decision if the patient is not capable to do so.    Advance directives can be documented in many types of formats,  documents have names such as:  Lliving will  Durable power of attorney for healthcare (healthcare proxy or healthcare power of attorney)  Combined directives  Physician orders for life-sustaining treatment    More information at:  [email or web address]

## 2020-12-21 ENCOUNTER — Encounter: Payer: Self-pay | Admitting: Internal Medicine

## 2020-12-21 NOTE — Assessment & Plan Note (Signed)
Here for CPX Prediabetes: Check A1c HTN: Well-controlled here, reports BPs are okay at home when he checks (rarely). No change, check BPs weekly, continue HCTZ, metoprolol.  Check labs. Hyperlipidemia: On Lipitor, labs. Cough variant asthma: Symptoms improved since the last visit. DJD, MSK: Ongoing back pain, follow-up by Ortho. Snoring: Continue with the snoring, his dentist prescribed a mouthguard before, Epworth scale scored 11 which is +.  OSA risks discussed, refer to neurology. Umbilical hernia: Reducible, urology previously recommended a surgical eval, I offered the same today: pt  declined for now.  Red flag symptoms discussed. 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   RTC 6 months

## 2020-12-21 NOTE — Assessment & Plan Note (Signed)
-  Td 2013 -  PNM 23--2015; prevnar:  2016 - Zostavax 2013 - shingrex recommended - s/p C-19 shots x3, recommend a booster -Flu shot today - CCS: Colonoscopy Dr Vladimir Faster 2005 (-),   Cscope 02/2017, due for a colonoscopy, patient aware, recommend to call GI -Sees urology yearly    -Diet-exercise discussed. -Labs: CMP, FLP, CBC, A1c, TSH

## 2020-12-23 ENCOUNTER — Telehealth (INDEPENDENT_AMBULATORY_CARE_PROVIDER_SITE_OTHER): Payer: Medicare (Managed Care) | Admitting: Family

## 2020-12-23 ENCOUNTER — Encounter: Payer: Self-pay | Admitting: Family

## 2020-12-23 ENCOUNTER — Other Ambulatory Visit: Payer: Self-pay

## 2020-12-23 DIAGNOSIS — R6889 Other general symptoms and signs: Secondary | ICD-10-CM | POA: Diagnosis not present

## 2020-12-23 MED ORDER — BENZONATATE 100 MG PO CAPS: 200.0000 mg | ORAL_CAPSULE | Freq: Three times a day (TID) | ORAL | 0 refills | Status: DC | PRN

## 2020-12-23 NOTE — Progress Notes (Signed)
MyChart Video Visit    Virtual Visit via Video Note   This visit type was conducted due to national recommendations for restrictions regarding the COVID-19 Pandemic (e.g. social distancing) in an effort to limit this patient's exposure and mitigate transmission in our community. This patient is at least at moderate risk for complications without adequate follow up. This format is felt to be most appropriate for this patient at this time. Physical exam was limited by quality of the video and audio technology used for the visit. CMA was able to get the patient set up on a video visit.  Patient location: Home. Patient and provider in visit Provider location: Office  I discussed the limitations of evaluation and management by telemedicine and the availability of in person appointments. The patient expressed understanding and agreed to proceed.  Visit Date: 12/23/2020  Today's healthcare provider: Jeanie Sewer, NP     Subjective:    Patient ID: Kirk Sutton, male    DOB: [birth year], 70 y.o.   MRN: [id number]  Chief Complaint  Patient presents with   Cough    Pt got his flu shot on Tuesday and has started having new symptoms.   Wheezing   Chills    Cough Associated symptoms include wheezing.  Wheezing  Associated symptoms include coughing.  Pt reports having non-productive cough, chills and wheezing that started yesterday, denies sore throat or sinus sx. States he had a flu shot 3 days ago, denies having any past vaccine reactions.     Outpatient Medications Prior to Visit  Medication Sig Dispense Refill   acetaminophen (TYLENOL) 650 MG CR tablet Take 650 mg by mouth every 8 (eight) hours as needed for pain.     albuterol (VENTOLIN HFA) 108 (90 Base) MCG/ACT inhaler INHALE 2 PUFFS INTO THE LUNGS EVERY 6 HOURS AS NEEDED FOR WHEEZING OR SHORTNESS OF BREATH 18 g 5   amphetamine-dextroamphetamine (ADDERALL) 10 MG tablet Take 5 mg by mouth daily with breakfast.     Ascorbic  Acid (VITAMIN C) 1000 MG tablet Take 1,000 mg by mouth daily.     atorvastatin (LIPITOR) 20 MG tablet Take 1 tablet (20 mg total) by mouth at bedtime. 90 tablet 3   azelastine (ASTELIN) 0.1 % nasal spray Place 1 spray into both nostrils every evening.     busPIRone (BUSPAR) 10 MG tablet Take 10 mg by mouth 2 (two) times daily.     clobetasol ointment (TEMOVATE) 5.10 % Apply 1 application topically 2 (two) times daily as needed. For hands     Coenzyme Q10 100 MG capsule Take 100 mg by mouth daily.     desloratadine (CLARINEX) 5 MG tablet Take 5 mg by mouth daily.     dextromethorphan-guaiFENesin (MUCINEX DM) 30-600 MG 12hr tablet Take 1 tablet by mouth 2 (two) times daily.     FLUoxetine (PROZAC) 40 MG capsule Take 1 tablet daily  2   Fluticasone Furoate (FLONASE SENSIMIST NA) Place into the nose daily as needed.     fluticasone-salmeterol (ADVAIR HFA) 115-21 MCG/ACT inhaler Inhale 2 puffs into the lungs in the morning and at bedtime.     hydrochlorothiazide (HYDRODIURIL) 25 MG tablet TAKE 1 TABLET(25 MG) BY MOUTH DAILY 90 tablet 1   HYDROcodone-acetaminophen (NORCO/VICODIN) 5-325 MG tablet Take 1 tablet by mouth 3 (three) times daily as needed.     metoprolol succinate (TOPROL-XL) 50 MG 24 hr tablet TAKE 1 TABLET(50 MG) BY MOUTH DAILY 90 tablet 1   OVER THE COUNTER  MEDICATION Take 1 each by mouth daily as needed. CBD Gummy - for thumb / hand pain     PAZEO 0.7 % SOLN Place 1 drop into both eyes as needed.  3   pyridOXINE (VITAMIN B-6) 100 MG tablet Take 400 mg by mouth daily. Super B complex     terazosin (HYTRIN) 1 MG capsule Take 3 capsules (3 mg total) by mouth daily. 270 capsule 2   Turmeric 500 MG CAPS Take 500 mg by mouth daily.      Vitamin D, Cholecalciferol, 25 MCG (1000 UT) CAPS Take 1 capsule by mouth daily.     Magnesium Gluconate 250 MG TABS Take 400 mg by mouth daily. (Patient not taking: Reported on 12/23/2020)     montelukast (SINGULAIR) 10 MG tablet Take 1 tablet (10 mg total) by  mouth at bedtime. (Patient not taking: Reported on 12/23/2020) 90 tablet 1   No facility-administered medications prior to visit.    Allergies  Allergen Reactions   Cetirizine Hcl     REACTION: prostatitis   Fexofenadine     REACTION: causes prostatitis    Review of Systems  Respiratory:  Positive for cough and wheezing.   See HPI above.     Objective:    Physical Exam Vitals and nursing note reviewed.  Constitutional:      Appearance: Normal appearance.  HENT:     Head: Normocephalic.  Pulmonary:     Effort: Pulmonary effort is normal. No respiratory distress.  Skin:    General: Skin is dry.     Coloration: Skin is not pale.     Findings: No erythema.  Neurological:     Mental Status: He is alert and oriented to person, place, and time.  Psychiatric:        Mood and Affect: Mood normal.        Behavior: Behavior normal.    Ht 5\' 11"  (1.803 m)   Wt 233 lb 7.5 oz (105.9 kg)   BMI 32.56 kg/m  Wt Readings from Last 3 Encounters:  12/23/20 233 lb 7.5 oz (105.9 kg)  12/20/20 233 lb 6 oz (105.9 kg)  05/03/20 231 lb (104.8 kg)       Assessment & Plan:   Problem List Items Addressed This Visit       Other   Flu-like symptoms    Reports getting flu shot 3 days ago and having now having symptoms. Also has allergies and Asthma. Advised continuing his rescue inhaler q6h and Advair, add 1 extra puff of Advair in the evenings just for the next 5 days or sooner if sx resolve. Drink at least 2L water qd. Will send Tessalon pearles.      Relevant Medications   benzonatate (TESSALON) 100 MG capsule    I am having Annie Paras. Crisco start on benzonatate. I am also having him maintain his Pazeo, FLUoxetine, clobetasol ointment, Fluticasone Furoate (FLONASE SENSIMIST NA), pyridOXINE, Turmeric, vitamin C, Magnesium Gluconate, albuterol, desloratadine, azelastine, busPIRone, amphetamine-dextroamphetamine, atorvastatin, HYDROcodone-acetaminophen, dextromethorphan-guaiFENesin,  terazosin, montelukast, hydrochlorothiazide, metoprolol succinate, Advair HFA, OVER THE COUNTER MEDICATION, Coenzyme Q10, Vitamin D (Cholecalciferol), and acetaminophen.  Meds ordered this encounter  Medications   benzonatate (TESSALON) 100 MG capsule    Sig: Take 2 capsules (200 mg total) by mouth 3 (three) times daily as needed for cough.    Dispense:  30 capsule    Refill:  0    Order Specific Question:   Supervising Provider    Answer:   ANDY, CAMILLE L [  [id number]]     I discussed the assessment and treatment plan with the patient. The patient was provided an opportunity to ask questions and all were answered. The patient agreed with the plan and demonstrated an understanding of the instructions.   The patient was advised to call back or seek an in-person evaluation if the symptoms worsen or if the condition fails to improve as anticipated.  I provided 20 minutes of face-to-face time during this encounter.   Jeanie Sewer, NP [place] [phone] (phone) [phone] (fax)  [place]

## 2020-12-23 NOTE — Assessment & Plan Note (Addendum)
Reports getting flu shot 3 days ago and having now having symptoms. Also has allergies and Asthma. Advised continuing his rescue inhaler q6h and Advair, add 1 extra puff of Advair in the evenings just for the next 5 days or sooner if sx resolve. Drink at least 2L water qd. Will send Tessalon pearles.

## 2021-01-05 DIAGNOSIS — M5416 Radiculopathy, lumbar region: Secondary | ICD-10-CM | POA: Diagnosis not present

## 2021-01-12 ENCOUNTER — Telehealth: Payer: Self-pay | Admitting: Internal Medicine

## 2021-01-12 DIAGNOSIS — R6889 Other general symptoms and signs: Secondary | ICD-10-CM

## 2021-01-12 MED ORDER — BENZONATATE 100 MG PO CAPS: 200.0000 mg | ORAL_CAPSULE | Freq: Three times a day (TID) | ORAL | 0 refills | Status: DC | PRN

## 2021-01-12 NOTE — Telephone Encounter (Signed)
Pt was seen 10/07 with Jeanie Sewer and is still feeling sick and was wondering about getting a refill   Medication: benzonatate (TESSALON) 100 MG capsule  Has the patient contacted their pharmacy? No.)  Preferred Pharmacy (with phone number or street name):  [id number] [address], [place] AT [facility] [address]  [place], [place] [address]  Phone:  [phone]  Fax:  [phone]

## 2021-01-12 NOTE — Telephone Encounter (Signed)
Rx sent 

## 2021-01-18 DIAGNOSIS — M180 Bilateral primary osteoarthritis of first carpometacarpal joints: Secondary | ICD-10-CM | POA: Diagnosis not present

## 2021-01-30 DIAGNOSIS — M1812 Unilateral primary osteoarthritis of first carpometacarpal joint, left hand: Secondary | ICD-10-CM | POA: Diagnosis not present

## 2021-01-30 DIAGNOSIS — M180 Bilateral primary osteoarthritis of first carpometacarpal joints: Secondary | ICD-10-CM | POA: Diagnosis not present

## 2021-02-01 ENCOUNTER — Other Ambulatory Visit: Payer: Self-pay | Admitting: Internal Medicine

## 2021-02-01 DIAGNOSIS — F331 Major depressive disorder, recurrent, moderate: Secondary | ICD-10-CM | POA: Diagnosis not present

## 2021-02-01 DIAGNOSIS — F900 Attention-deficit hyperactivity disorder, predominantly inattentive type: Secondary | ICD-10-CM | POA: Diagnosis not present

## 2021-02-06 ENCOUNTER — Telehealth: Payer: Self-pay

## 2021-02-06 NOTE — Telephone Encounter (Signed)
PA initiated via Covermymeds; KEY: [id number]. PA approved.   [id number];FPULGS:[id number];Review Type:Qty;Coverage Start Date:01/07/2021;Coverage End Date:02/06/2022

## 2021-02-13 ENCOUNTER — Telehealth: Payer: Self-pay

## 2021-02-13 NOTE — Chronic Care Management (AMB) (Signed)
Chronic Care Management Pharmacy Assistant   Name: Kirk Sutton  MRN: [id number] DOB: 1951-02-22   Reason for Encounter: General assessment Call    Recent office visits:  12/20/20 - [place] Branch MD - Seen for general adult medical examination - Labs ordered - No medication changes noted - Follow up in 6 months   Recent consult visits:  12/23/20 Velora Heckler primary care [place] NP - Video visit seen for flu like Symptoms - Start benzonatate 100 mg 2 capsules 3 times daily  as needed - No follow up noted   Hospital visits:  None in previous 6 months  Medications: Outpatient Encounter Medications as of 02/13/2021  Medication Sig Note   acetaminophen (TYLENOL) 650 MG CR tablet Take 650 mg by mouth every 8 (eight) hours as needed for pain. 12/20/2020: PRN   albuterol (VENTOLIN HFA) 108 (90 Base) MCG/ACT inhaler INHALE 2 PUFFS INTO THE LUNGS EVERY 6 HOURS AS NEEDED FOR WHEEZING OR SHORTNESS OF BREATH    amphetamine-dextroamphetamine (ADDERALL) 10 MG tablet Take 5 mg by mouth daily with breakfast.    Ascorbic Acid (VITAMIN C) 1000 MG tablet Take 1,000 mg by mouth daily.    atorvastatin (LIPITOR) 20 MG tablet TAKE 1 TABLET(20 MG) BY MOUTH AT BEDTIME    azelastine (ASTELIN) 0.1 % nasal spray Place 1 spray into both nostrils every evening.    benzonatate (TESSALON) 100 MG capsule Take 2 capsules (200 mg total) by mouth 3 (three) times daily as needed for cough.    busPIRone (BUSPAR) 10 MG tablet Take 10 mg by mouth 2 (two) times daily.    clobetasol ointment (TEMOVATE) 6.19 % Apply 1 application topically 2 (two) times daily as needed. For hands    Coenzyme Q10 100 MG capsule Take 100 mg by mouth daily.    desloratadine (CLARINEX) 5 MG tablet Take 5 mg by mouth daily.    dextromethorphan-guaiFENesin (MUCINEX DM) 30-600 MG 12hr tablet Take 1 tablet by mouth 2 (two) times daily.    FLUoxetine (PROZAC) 40 MG capsule Take 1 tablet daily    Fluticasone Furoate (FLONASE  SENSIMIST NA) Place into the nose daily as needed.    fluticasone-salmeterol (ADVAIR HFA) 115-21 MCG/ACT inhaler Inhale 2 puffs into the lungs in the morning and at bedtime.    hydrochlorothiazide (HYDRODIURIL) 25 MG tablet TAKE 1 TABLET(25 MG) BY MOUTH DAILY    HYDROcodone-acetaminophen (NORCO/VICODIN) 5-325 MG tablet Take 1 tablet by mouth 3 (three) times daily as needed. 03/08/2020: Per ortho   Magnesium Gluconate 250 MG TABS Take 400 mg by mouth daily. (Patient not taking: Reported on 12/23/2020)    metoprolol succinate (TOPROL-XL) 50 MG 24 hr tablet TAKE 1 TABLET(50 MG) BY MOUTH DAILY    montelukast (SINGULAIR) 10 MG tablet TAKE 1 TABLET(10 MG) BY MOUTH AT BEDTIME    OVER THE COUNTER MEDICATION Take 1 each by mouth daily as needed. CBD Gummy - for thumb / hand pain    PAZEO 0.7 % SOLN Place 1 drop into both eyes as needed. 02/23/2015: PRN   pyridOXINE (VITAMIN B-6) 100 MG tablet Take 400 mg by mouth daily. Super B complex    terazosin (HYTRIN) 1 MG capsule TAKE 3 CAPSULES(3 MG) BY MOUTH DAILY    Turmeric 500 MG CAPS Take 500 mg by mouth daily.     Vitamin D, Cholecalciferol, 25 MCG (1000 UT) CAPS Take 1 capsule by mouth daily.    No facility-administered encounter medications on file as  of 02/13/2021.   Have you had any problems recently with your health? Patient states that he has no concerns about his health at this time.   Have you had any problems with your pharmacy? Patient states that he has no problems with his pharmacy.   What issues or side effects are you having with your medications? Patient states that he has no concerns regarding side effects from medications at this time.   What would you like me to pass along to [place] for them to help you with?  N/a  What can we do to take care of you better?  Patient states that he is doing well and has no concerns at this time. He states that he is taking his Atorvastatin and has no issues, He appreciates the call.   [facility] Drugs: atorvastatin (LIPITOR) 20 MG tablet- last fills dates: 02/01/21 90 DS,  11/09/2020 90 DS  Confirmed with [facility] pharmacy the last fill date and DS. Pharmacy stated that patient has picked up medication.   Andee Poles, CMA

## 2021-02-14 DIAGNOSIS — M5416 Radiculopathy, lumbar region: Secondary | ICD-10-CM | POA: Diagnosis not present

## 2021-02-14 DIAGNOSIS — Z5181 Encounter for therapeutic drug level monitoring: Secondary | ICD-10-CM | POA: Diagnosis not present

## 2021-02-14 DIAGNOSIS — M5116 Intervertebral disc disorders with radiculopathy, lumbar region: Secondary | ICD-10-CM | POA: Diagnosis not present

## 2021-02-14 DIAGNOSIS — Z79899 Other long term (current) drug therapy: Secondary | ICD-10-CM | POA: Diagnosis not present

## 2021-02-28 DIAGNOSIS — L2089 Other atopic dermatitis: Secondary | ICD-10-CM | POA: Diagnosis not present

## 2021-02-28 DIAGNOSIS — Z85828 Personal history of other malignant neoplasm of skin: Secondary | ICD-10-CM | POA: Diagnosis not present

## 2021-02-28 DIAGNOSIS — D1801 Hemangioma of skin and subcutaneous tissue: Secondary | ICD-10-CM | POA: Diagnosis not present

## 2021-02-28 DIAGNOSIS — L853 Xerosis cutis: Secondary | ICD-10-CM | POA: Diagnosis not present

## 2021-02-28 DIAGNOSIS — L738 Other specified follicular disorders: Secondary | ICD-10-CM | POA: Diagnosis not present

## 2021-02-28 DIAGNOSIS — L821 Other seborrheic keratosis: Secondary | ICD-10-CM | POA: Diagnosis not present

## 2021-03-16 ENCOUNTER — Other Ambulatory Visit: Payer: Self-pay | Admitting: Internal Medicine

## 2021-03-16 DIAGNOSIS — R6889 Other general symptoms and signs: Secondary | ICD-10-CM

## 2021-03-23 DIAGNOSIS — N400 Enlarged prostate without lower urinary tract symptoms: Secondary | ICD-10-CM | POA: Diagnosis not present

## 2021-03-27 DIAGNOSIS — J301 Allergic rhinitis due to pollen: Secondary | ICD-10-CM | POA: Diagnosis not present

## 2021-03-27 DIAGNOSIS — L2089 Other atopic dermatitis: Secondary | ICD-10-CM | POA: Diagnosis not present

## 2021-03-27 DIAGNOSIS — J45991 Cough variant asthma: Secondary | ICD-10-CM | POA: Diagnosis not present

## 2021-03-27 DIAGNOSIS — J3089 Other allergic rhinitis: Secondary | ICD-10-CM | POA: Diagnosis not present

## 2021-03-29 ENCOUNTER — Ambulatory Visit (HOSPITAL_BASED_OUTPATIENT_CLINIC_OR_DEPARTMENT_OTHER)
Admission: RE | Admit: 2021-03-29 | Discharge: 2021-03-29 | Disposition: A | Discharge status: Home / Self Care | Payer: Medicare (Managed Care) | Source: Ambulatory Visit | Attending: Medical | Admitting: Medical

## 2021-03-29 ENCOUNTER — Other Ambulatory Visit: Payer: Self-pay

## 2021-03-29 ENCOUNTER — Ambulatory Visit: Payer: Medicare (Managed Care) | Admitting: Medical

## 2021-03-29 ENCOUNTER — Telehealth: Payer: Self-pay | Admitting: Internal Medicine

## 2021-03-29 VITALS — BP 150/70 | HR 67 | Temp 98.3°F | Resp 18 | Ht 71.0 in | Wt 234.0 lb

## 2021-03-29 DIAGNOSIS — R051 Acute cough: Secondary | ICD-10-CM | POA: Insufficient documentation

## 2021-03-29 DIAGNOSIS — Z8709 Personal history of other diseases of the respiratory system: Secondary | ICD-10-CM | POA: Diagnosis not present

## 2021-03-29 DIAGNOSIS — J4520 Mild intermittent asthma, uncomplicated: Secondary | ICD-10-CM | POA: Diagnosis not present

## 2021-03-29 DIAGNOSIS — R059 Cough, unspecified: Secondary | ICD-10-CM | POA: Diagnosis not present

## 2021-03-29 DIAGNOSIS — R062 Wheezing: Secondary | ICD-10-CM | POA: Diagnosis not present

## 2021-03-29 LAB — POCT INFLUENZA A/B
Influenza A, POC: NEGATIVE
Influenza B, POC: NEGATIVE

## 2021-03-29 LAB — POC COVID19 BINAXNOW: SARS Coronavirus 2 Ag: NEGATIVE

## 2021-03-29 MED ORDER — HYDROCODONE BIT-HOMATROP MBR 5-1.5 MG/5ML PO SOLN: 5.0000 mL | Freq: Three times a day (TID) | ORAL | 0 refills | Status: DC | PRN

## 2021-03-29 MED ORDER — METHYLPREDNISOLONE 4 MG PO TABS: ORAL_TABLET | ORAL | 0 refills | Status: DC

## 2021-03-29 NOTE — Progress Notes (Signed)
Subjective:    Patient ID: Kirk Sutton, male    DOB: [birth year], 71 y.o.   MRN: [id number]  HPI  Pt in states just recently he felt sick since yesterday. Covid and flu test negative today. Pt states he can hear some mild wheezing recently. Hx of mild asthma. Pt is on advair 2 inh twice a day. Pt also on montelkust and loratadine as well.  Pt tried benzonatate.He felt like interaction with norco. Describes feeling loopy. He only takes norco rarely. His lower back was hurting.   Pt was about to meet ceo today but could not due to cough.  Cough is dry and not productive. No fever, no chills, no sweats or diffuse bodyaches.   Pt wife sick with cough. She has not tested for   Review of Systems  Constitutional:  Negative for chills, fatigue and fever.  HENT:  Negative for congestion and drooling.   Respiratory:  Positive for cough and wheezing. Negative for chest tightness and shortness of breath.   Cardiovascular:  Negative for chest pain and palpitations.  Gastrointestinal:  Negative for abdominal pain, anal bleeding, diarrhea and nausea.  Genitourinary:  Negative for dysuria, frequency and hematuria.  Musculoskeletal:  Negative for back pain, joint swelling and neck pain.  Skin:  Negative for rash.  Neurological:  Negative for facial asymmetry, speech difficulty, weakness, numbness and headaches.  Hematological:  Negative for adenopathy. Does not bruise/bleed easily.  Psychiatric/Behavioral:  Negative for behavioral problems, confusion and dysphoric mood. The patient is not nervous/anxious.     Past Medical History:  Diagnosis Date   Allergic rhinitis    Asthma    Chronic prostatitis    sees urology   Diabetes mellitus    type 11 (a1c 6.0 03/2008)/no meds.   Hernia, umbilical    Hyperlipidemia    Hypertension    Psoriasis    @ hands, sees derm     Social History   Socioeconomic History   Marital status: Married    Spouse name: Not on file   Number of children: 2    Years of education: Not on file   Highest education level: Not on file  Occupational History   Occupation: Administrator, arts  Tobacco Use   Smoking status: Never   Smokeless tobacco: Never  Vaping Use   Vaping Use: Never used  Substance and Sexual Activity   Alcohol use: Yes    Comment: socially, rare   Drug use: No   Sexual activity: Yes  Other Topics Concern   Not on file  Social History Narrative   Household-- pt, wife    Social Determinants of Health   Financial Resource Strain: Low Risk    Difficulty of Paying Living Expenses: Not very hard  Food Insecurity: No Food Insecurity   Worried About Charity fundraiser in the Last Year: Never true   [place] in the Last Year: Never true  Transportation Needs: No Transportation Needs   Lack of Transportation (Medical): No   Lack of Transportation (Non-Medical): No  Physical Activity: Insufficiently Active   Days of Exercise per Week: 1 day   Minutes of Exercise per Session: 20 min  Stress: No Stress Concern Present   Feeling of Stress : Not at all  Social Connections: Moderately Integrated   Frequency of Communication with Friends and Family: More than three times a week   Frequency of Social Gatherings with Friends and Family: More than three times  a week   Attends Religious Services: More than 4 times per year   Active Member of Clubs or Organizations: No   Attends Archivist Meetings: Never   Marital Status: Married  Human resources officer Violence: Not At Risk   Fear of Current or Ex-Partner: No   Emotionally Abused: No   Physically Abused: No   Sexually Abused: No    Past Surgical History:  Procedure Laterality Date   [place]     NASAL SEPTUM SURGERY  2009   SHOULDER SURGERY  2008   left   TESTICLE SURGERY     undescended repair     Family History  Problem Relation Age of Onset   Diabetes Sister    Hypertension Mother     Colon polyps Mother    Hypertension Sister    Heart attack Father        x 3 onset age 6, died at 46   Colon polyps Father    Cancer Father        bladder   Colon cancer Neg Hx    Prostate cancer Neg Hx    Stomach cancer Neg Hx     Allergies  Allergen Reactions   Cetirizine Hcl     REACTION: prostatitis   Fexofenadine     REACTION: causes prostatitis    Current Outpatient Medications on File Prior to Visit  Medication Sig Dispense Refill   acetaminophen (TYLENOL) 650 MG CR tablet Take 650 mg by mouth every 8 (eight) hours as needed for pain.     albuterol (VENTOLIN HFA) 108 (90 Base) MCG/ACT inhaler INHALE 2 PUFFS INTO THE LUNGS EVERY 6 HOURS AS NEEDED FOR WHEEZING OR SHORTNESS OF BREATH 18 g 5   amphetamine-dextroamphetamine (ADDERALL) 10 MG tablet Take 5 mg by mouth daily with breakfast.     Ascorbic Acid (VITAMIN C) 1000 MG tablet Take 1,000 mg by mouth daily.     atorvastatin (LIPITOR) 20 MG tablet TAKE 1 TABLET(20 MG) BY MOUTH AT BEDTIME 90 tablet 1   azelastine (ASTELIN) 0.1 % nasal spray Place 1 spray into both nostrils every evening.     benzonatate (TESSALON) 100 MG capsule TAKE 2 CAPSULES(200 MG) BY MOUTH THREE TIMES DAILY AS NEEDED FOR COUGH 30 capsule 0   busPIRone (BUSPAR) 10 MG tablet Take 10 mg by mouth 2 (two) times daily.     clobetasol ointment (TEMOVATE) 7.82 % Apply 1 application topically 2 (two) times daily as needed. For hands     Coenzyme Q10 100 MG capsule Take 100 mg by mouth daily.     desloratadine (CLARINEX) 5 MG tablet Take 5 mg by mouth daily.     dextromethorphan-guaiFENesin (MUCINEX DM) 30-600 MG 12hr tablet Take 1 tablet by mouth 2 (two) times daily.     FLUoxetine (PROZAC) 40 MG capsule Take 1 tablet daily  2   Fluticasone Furoate (FLONASE SENSIMIST NA) Place into the nose daily as needed.     fluticasone-salmeterol (ADVAIR HFA) 115-21 MCG/ACT inhaler Inhale 2 puffs into the lungs in the morning and at bedtime.     hydrochlorothiazide  (HYDRODIURIL) 25 MG tablet TAKE 1 TABLET(25 MG) BY MOUTH DAILY 90 tablet 1   HYDROcodone-acetaminophen (NORCO/VICODIN) 5-325 MG tablet Take 1 tablet by mouth 3 (three) times daily as needed.     Magnesium Gluconate 250 MG TABS Take 400 mg by mouth daily.     metoprolol succinate (TOPROL-XL) 50 MG  24 hr tablet TAKE 1 TABLET(50 MG) BY MOUTH DAILY 90 tablet 1   montelukast (SINGULAIR) 10 MG tablet TAKE 1 TABLET(10 MG) BY MOUTH AT BEDTIME 90 tablet 1   OVER THE COUNTER MEDICATION Take 1 each by mouth daily as needed. CBD Gummy - for thumb / hand pain     PAZEO 0.7 % SOLN Place 1 drop into both eyes as needed.  3   pyridOXINE (VITAMIN B-6) 100 MG tablet Take 400 mg by mouth daily. Super B complex     terazosin (HYTRIN) 1 MG capsule TAKE 3 CAPSULES(3 MG) BY MOUTH DAILY 270 capsule 1   Turmeric 500 MG CAPS Take 500 mg by mouth daily.      Vitamin D, Cholecalciferol, 25 MCG (1000 UT) CAPS Take 1 capsule by mouth daily.     No current facility-administered medications on file prior to visit.    BP (!) 150/70    Pulse 67    Temp 98.3 F (36.8 C)    Resp 18    Ht 5\' 11"  (1.803 m)    Wt 234 lb (106.1 kg)    SpO2 97%    BMI 32.64 kg/m       Objective:   Physical Exam   General- No acute distress. Pleasant patient. Neck- Full range of motion, no jvd Lungs- Clear, even and unlabored. Heart- regular rate and rhythm. Neurologic- CNII- XII grossly intact.  Lower ext- no pedal edema. Negative homans signs. Heent- no frontal or maxillary sinus pressure. +pnd. Lower ext- no pedal edema.    Assessment & Plan:   Patient Instructions  Recent cough and wheezing since Monday.  History of intermittent asthma well-controlled.  Considering early viral illness might be causing exacerbation.  Rapid flu and COVID test in office today was negative.  Decided to go ahead and send out RSV PCR.  We will follow that and update you on those results.  Continue Advair, montelukast, loratadine and Flonase.  Making  Hycodan cough syrup available to use at home and prior to sleep.  Counseled on sedation side effects.  Not to use your Norco tablet or benzonatate while using Hycodan cough syrup.  Since you are wheezing despite using Advair decided to go ahead and prescribe 6-day taper Medrol course.  Rx advisement given.  Chest x-ray today.  Early in your illness.  So recommend doing repeat COVID test Thursday or Friday morning. If + then you would be within 5 day of symptoms onset so would prescribe antiviral.  Follow up in 7 days or sooner if needed   [employer], PA-C

## 2021-03-29 NOTE — Telephone Encounter (Signed)
Pt contacted over with symps: deep cough, wheezing, and sore throat. No appts in ov, offered pt vv. Pt declined. Pt would like for someone to contact him. Please advise.

## 2021-03-29 NOTE — Telephone Encounter (Signed)
Spoke w/ Pt- informed he needs to be seen- double checked schedule no appts available at this time. Recommend he check back through out the day we may have a cancellation, or he could do an e-visit via mychart. Pt verbalized understanding.

## 2021-03-29 NOTE — Telephone Encounter (Signed)
Contacted pt & sched with [facility] @ 10 1/11

## 2021-03-29 NOTE — Patient Instructions (Signed)
Recent cough and wheezing since Monday.  History of intermittent asthma well-controlled.  Considering early viral illness might be causing exacerbation.  Rapid flu and COVID test in office today was negative.  Decided to go ahead and send out RSV PCR.  We will follow that and update you on those results.  Continue Advair, montelukast, loratadine and Flonase.  Making Hycodan cough syrup available to use at home and prior to sleep.  Counseled on sedation side effects.  Not to use your Norco tablet or benzonatate while using Hycodan cough syrup.  Since you are wheezing despite using Advair decided to go ahead and prescribe 6-day taper Medrol course.  Rx advisement given.  Chest x-ray today.  Early in your illness.  So recommend doing repeat COVID test Thursday or Friday morning. If + then you would be within 5 day of symptoms onset so would prescribe antiviral.  Follow up in 7 days or sooner if needed

## 2021-03-30 DIAGNOSIS — N400 Enlarged prostate without lower urinary tract symptoms: Secondary | ICD-10-CM | POA: Diagnosis not present

## 2021-03-30 LAB — RSV SCREEN (NASOPHARYNGEAL) NOT AT ARMC
MICRO NUMBER:: 12856909
RESULT:: NOT DETECTED
SPECIMEN QUALITY:: ADEQUATE

## 2021-04-06 ENCOUNTER — Telehealth: Payer: Self-pay | Admitting: Internal Medicine

## 2021-04-06 NOTE — Telephone Encounter (Signed)
Pt seen on Edward on 03/29/21- will forward to [facility].

## 2021-04-06 NOTE — Telephone Encounter (Signed)
Pt called and stated he is still having issues with his cough. He wants to know if there is anything else he needs to take or if he needs to come back in to be seen.

## 2021-04-07 NOTE — Telephone Encounter (Signed)
Pt scheduled to see PCP on Monday , stated the cough is improving some but still there and he is taking the cough syrup still

## 2021-04-10 ENCOUNTER — Ambulatory Visit: Payer: Medicare (Managed Care) | Admitting: Internal Medicine

## 2021-04-10 ENCOUNTER — Encounter: Payer: Self-pay | Admitting: Internal Medicine

## 2021-04-10 VITALS — BP 126/68 | HR 61 | Temp 98.0°F | Resp 18 | Ht 71.0 in | Wt 236.4 lb

## 2021-04-10 DIAGNOSIS — R051 Acute cough: Secondary | ICD-10-CM | POA: Diagnosis not present

## 2021-04-10 DIAGNOSIS — R6889 Other general symptoms and signs: Secondary | ICD-10-CM

## 2021-04-10 MED ORDER — BENZONATATE 100 MG PO CAPS: ORAL_CAPSULE | ORAL | 0 refills | Status: DC

## 2021-04-10 MED ORDER — AZITHROMYCIN 250 MG PO TABS: ORAL_TABLET | ORAL | 0 refills | Status: DC

## 2021-04-10 NOTE — Progress Notes (Signed)
Subjective:    Patient ID: Kirk Sutton, male    DOB: April 21, 1950, 71 y.o.   MRN: [id number]  DOS:  04/10/2021 Type of visit - description: acute  Went to see his allergist 03/27/2020 and he was doing well that day. Symptoms a started the next day so he came back to this office: Cough, on and off associated with some wheezing and chest congestion. No fever chills No nausea or vomiting No sputum production No GERD. Work-up included: Normal chest x-ray.  Also COVID - influenza - RSV tests: Negative   Was prescribed prednisone and hydrocodone, questionable help.  He is here for further advice because he continue with cough. Denies any problems sleeping. He is not sure if the hydrocodone syrup helped.  Review of Systems See above   Past Medical History:  Diagnosis Date   Allergic rhinitis    Asthma    Chronic prostatitis    sees urology   Diabetes mellitus    type 11 (a1c 6.0 03/2008)/no meds.   Hernia, umbilical    Hyperlipidemia    Hypertension    Psoriasis    @ hands, sees derm    Past Surgical History:  Procedure Laterality Date   [place]   HERNIA REPAIR     NASAL SEPTUM SURGERY  2009   SHOULDER SURGERY  2008   left   TESTICLE SURGERY     undescended repair     Current Outpatient Medications  Medication Instructions   acetaminophen (TYLENOL) 650 mg, Oral, Every 8 hours PRN   albuterol (VENTOLIN HFA) 108 (90 Base) MCG/ACT inhaler INHALE 2 PUFFS INTO THE LUNGS EVERY 6 HOURS AS NEEDED FOR WHEEZING OR SHORTNESS OF BREATH   amphetamine-dextroamphetamine (ADDERALL) 10 MG tablet 5 mg, Oral, Daily with breakfast   atorvastatin (LIPITOR) 20 MG tablet TAKE 1 TABLET(20 MG) BY MOUTH AT BEDTIME   azelastine (ASTELIN) 0.1 % nasal spray 1 spray, Each Nare, Every evening   azithromycin (ZITHROMAX Z-PAK) 250 MG tablet 2 tabs a day the first day, then 1 tab a day x 4 days   benzonatate (TESSALON) 100 MG capsule TAKE 2 CAPSULES(200 MG) BY MOUTH  THREE TIMES DAILY AS NEEDED FOR COUGH   busPIRone (BUSPAR) 10 mg, Oral, 2 times daily   clobetasol ointment (TEMOVATE) 4.85 % 1 application, Topical, 2 times daily PRN, For hands    Coenzyme Q10 100 mg, Oral, Daily   desloratadine (CLARINEX) 5 mg, Oral, Daily   dextromethorphan-guaiFENesin (MUCINEX DM) 30-600 MG 12hr tablet 1 tablet, Oral, 2 times daily   FLUoxetine (PROZAC) 40 MG capsule Take 1 tablet daily   Fluticasone Furoate (FLONASE SENSIMIST NA) Nasal, Daily PRN   fluticasone-salmeterol (ADVAIR HFA) 115-21 MCG/ACT inhaler 2 puffs, Inhalation, 2 times daily   hydrochlorothiazide (HYDRODIURIL) 25 MG tablet TAKE 1 TABLET(25 MG) BY MOUTH DAILY   HYDROcodone-acetaminophen (NORCO/VICODIN) 5-325 MG tablet 1 tablet, 3 times daily PRN   Magnesium Gluconate 400 mg, Oral, Daily   metoprolol succinate (TOPROL-XL) 50 MG 24 hr tablet TAKE 1 TABLET(50 MG) BY MOUTH DAILY   montelukast (SINGULAIR) 10 MG tablet TAKE 1 TABLET(10 MG) BY MOUTH AT BEDTIME   OVER THE COUNTER MEDICATION 1 each, Oral, Daily PRN, CBD Gummy - for thumb / hand pain   PAZEO 0.7 % SOLN 1 drop, Both Eyes, As needed   pyridOXINE (VITAMIN B-6) 400 mg, Oral, Daily, Super B complex   terazosin (HYTRIN) 1 MG capsule TAKE 3 CAPSULES(3 MG) BY  MOUTH DAILY   Turmeric 500 mg, Oral, Daily   vitamin C 1,000 mg, Oral, Daily   Vitamin D, Cholecalciferol, 25 MCG (1000 UT) CAPS 1 capsule, Oral, Daily       Objective:   Physical Exam BP 126/68 (BP Location: Left Arm, Patient Position: Sitting, Cuff Size: Normal)    Pulse 61    Temp 98 F (36.7 C) (Oral)    Resp 18    Ht 5\' 11"  (1.803 m)    Wt 236 lb 6 oz (107.2 kg)    SpO2 97%    BMI 32.97 kg/m  General:   Well developed, NAD, BMI noted. HEENT:  Normocephalic . Face symmetric, atraumatic. Throat symmetric.  Nose with no congestion. Lungs:  minimally increased expiratory time with cough, few rhonchi.  No crackles. Normal respiratory effort, no intercostal retractions, no accessory muscle  use. Heart: RRR,  no murmur.  Lower extremities: no pretibial edema bilaterally  Skin: Not pale. Not jaundice Neurologic:  alert & oriented X3.  Speech normal, gait appropriate for age and unassisted Psych--  Cognition and judgment appear intact.  Cooperative with normal attention span and concentration.  Behavior appropriate. No anxious or depressed appearing.      Assessment     Assessment Prediabetes  HTN Hyperlipidemia Anxiety Dr Fay Records (psych) Rx prozac ~12-2014  Cough variant asthma, atopic dermatitis, allergic rhinitis and allergic conjunctivitis. MSK:  --Chronic back pain, pain medication as needed, used to see Dr Nelva Bush, did PT @ [facility] [phone], better --DJD- hands Chronic prostatitis, sees urology Psoriasis, hands, sees dermatology Snoring (severe per pt)- dentist Rx a moth guard ~ 08-2015, helps  PLAN: Cough: In the context of asthma, some increased expiratory time with cough.  Suspect mild asthma exacerbation, from bronchitis/viral infection?  Was seen by another practitioner few days ago and prescribed prednisone and hydrocodone without much help.  Plan:  Continue Advair,   albuterol to every 4 hours as needed for cough. Cough control with Mucinex DM and Tessalon Perles. Zithromax Stop hydrocodone  If symptoms continue, consider another round of steroids   This visit occurred during the SARS-CoV-2 public health emergency.  Safety protocols were in place, including screening questions prior to the visit, additional usage of staff PPE, and extensive cleaning of exam room while observing appropriate contact time as indicated for disinfecting solutions.

## 2021-04-10 NOTE — Patient Instructions (Signed)
Continue Advair twice a day  Continue albuterol, you can take 2 puffs every 4 hours if you are coughing, wheezing.  Mucinex DM twice daily as needed until better  Is also okay to take Ladona Ridgel (I sent an additional prescription) if the cough continue  Start Zithromax, an antibiotic  Call if not gradually better in the next 10 days to 2 weeks.

## 2021-04-10 NOTE — Assessment & Plan Note (Signed)
Cough: In the context of asthma, some increased expiratory time with cough.  Suspect mild asthma exacerbation, from bronchitis/viral infection?  Was seen by another practitioner few days ago and prescribed prednisone and hydrocodone without much help.  Plan:  Continue Advair,   albuterol to every 4 hours as needed for cough. Cough control with Mucinex DM and Tessalon Perles. Zithromax Stop hydrocodone  If symptoms continue, consider another round of steroids

## 2021-04-13 ENCOUNTER — Telehealth: Payer: Medicare (Managed Care)

## 2021-04-17 ENCOUNTER — Ambulatory Visit (INDEPENDENT_AMBULATORY_CARE_PROVIDER_SITE_OTHER): Payer: Medicare (Managed Care) | Admitting: Pharmacist

## 2021-04-17 DIAGNOSIS — I10 Essential (primary) hypertension: Secondary | ICD-10-CM

## 2021-04-17 DIAGNOSIS — J4541 Moderate persistent asthma with (acute) exacerbation: Secondary | ICD-10-CM | POA: Diagnosis not present

## 2021-04-17 DIAGNOSIS — E785 Hyperlipidemia, unspecified: Secondary | ICD-10-CM | POA: Diagnosis not present

## 2021-04-17 DIAGNOSIS — F411 Generalized anxiety disorder: Secondary | ICD-10-CM

## 2021-04-17 DIAGNOSIS — R739 Hyperglycemia, unspecified: Secondary | ICD-10-CM

## 2021-04-17 NOTE — Chronic Care Management (AMB) (Signed)
Chronic Care Management Pharmacy Note  04/18/2021 Name:  Kirk Sutton MRN:  [id number] DOB:  Aug 17, 1950  Summary: Reviewed adherence / refill history. Patient did fill and pick up atorvastatin in November 2022 for 90 days Discussed need vaccines - patient to check cost of Shingrix vaccine with pharmacy - should be better covered for 2023.  Tetanus will be due later in 2023 - reminded patient.  Subjective: Kirk Sutton is an 71 y.o. year old male who is a primary patient of Paz, Alda Berthold, MD.  The CCM team was consulted for assistance with disease management and care coordination needs.    Engaged with patient by telephone for follow up visit in response to provider referral for pharmacy case management and/or care coordination services.   Consent to Services:  The patient was given information about Chronic Care Management services, agreed to services, and gave verbal consent prior to initiation of services.  Please see initial visit note for detailed documentation.   Patient Care Team: Colon Branch, MD as PCP - General Thomasene Mohair Verner Chol., MD as Consulting Physician (Urology) Calvert Cantor, MD as Consulting Physician (Ophthalmology) Orbie Hurst, MD as Consulting Physician (Dermatology) Gardiner Barefoot, DPM as Consulting Physician (Podiatry) Roseanne Kaufman, MD as Consulting Physician (Orthopedic Surgery) Ricard Dillon, MD (Psychiatry) Suella Broad, MD as Consulting Physician (Physical Medicine and Rehabilitation) Tiajuana Amass, MD as Referring Physician (Allergy and Immunology) Cherre Robins, RPH-CPP (Pharmacist)  Recent office visits: 12/23/20 [facility] [place] [facility] - Jeanie Sewer NP - Video visit seen for flu like Symptoms - Start benzonatate 100 mg 2 capsules 3 times daily  as needed - No follow up noted  12/20/20 - Colon Branch MD - Seen for general adult medical examination - Labs ordered - No medication changes noted - Follow up in 6 months     Recent consult visits:  12/12/2020 - Ortho (Dr Amedeo Plenty) unable to view full note  Hospital visits: None in previous 6 months  Objective:  Lab Results  Component Value Date   CREATININE 1.16 12/20/2020   CREATININE 1.18 04/26/2020   CREATININE 1.25 (H) 12/23/2019    Lab Results  Component Value Date   HGBA1C 6.2 12/20/2020   Last diabetic Eye exam:  Lab Results  Component Value Date/Time   HMDIABEYEEXA normal 10/17/2008 12:00 AM    Last diabetic Foot exam:  Lab Results  Component Value Date/Time   HMDIABFOOTEX yes 02/16/2009 12:00 AM        Component Value Date/Time   CHOL 100 12/20/2020 0834   TRIG 73.0 12/20/2020 0834   TRIG 52 02/20/2006 1053   HDL 51.50 12/20/2020 0834   CHOLHDL 2 12/20/2020 0834   VLDL 14.6 12/20/2020 0834   LDLCALC 34 12/20/2020 0834   LDLCALC 83 12/23/2019 0828    Hepatic Function Latest Ref Rng & Units 12/20/2020 02/10/2020 12/23/2019  Total Protein 6.0 - 8.3 g/dL 6.1 - 6.1  Albumin 3.5 - 5.2 g/dL 4.1 - -  AST 0 - 37 U/L _0 ALT 0 - 53 U/L _1 Alk Phosphatase 39 - 117 U/L 72 - -  Total Bilirubin 0.2 - 1.2 mg/dL 1.1 - 0.9  Bilirubin, Direct 0.0 - 0.3 mg/dL - - -    Lab Results  Component Value Date/Time   TSH 1.52 12/20/2020 08:34 AM   TSH 1.25 12/22/2018 01:33 PM    CBC Latest Ref Rng & Units 12/20/2020 12/23/2019 12/22/2018  WBC 4.0 -  10.5 K/uL 5.7 5.5 8.4  Hemoglobin 13.0 - 17.0 g/dL 12.9(L) 12.9(L) 13.5  Hematocrit 39.0 - 52.0 % 38.4(L) 39.0 40.2  Platelets 150.0 - 400.0 K/uL 138.0(L) 139(L) 151.0    No results found for: VD25OH  Clinical ASCVD: No  The ASCVD Risk score (Arnett DK, et al., 2019) failed to calculate for the following reasons:   The valid total cholesterol range is 130 to 320 mg/dL     Social History   Tobacco Use  Smoking Status Never  Smokeless Tobacco Never   BP Readings from Last 3 Encounters:  04/10/21 126/68  03/29/21 (!) 150/70  12/20/20 132/84   Pulse Readings from Last 3  Encounters:  04/10/21 61  03/29/21 67  12/20/20 78   Wt Readings from Last 3 Encounters:  04/10/21 236 lb 6 oz (107.2 kg)  03/29/21 234 lb (106.1 kg)  12/23/20 233 lb 7.5 oz (105.9 kg)    Assessment: Review of patient past medical history, allergies, medications, health status, including review of consultants reports, laboratory and other test data, was performed as part of comprehensive evaluation and provision of chronic care management services.   SDOH:  (Social Determinants of Health) assessments and interventions performed:  SDOH Interventions    Flowsheet Row Most Recent Value  SDOH Interventions   Financial Strain Interventions Intervention Not Indicated       CCM Care Plan  Allergies  Allergen Reactions   Cetirizine Hcl     REACTION: prostatitis   Fexofenadine     REACTION: causes prostatitis    Medications Reviewed Today     Reviewed by Cherre Robins, RPH-CPP (Pharmacist) on 04/17/21 at 1027  Med List Status: <None>   Medication Order Taking? Sig Documenting Provider Last Dose Status Informant  acetaminophen (TYLENOL) 650 MG CR tablet [id number] Yes Take 650 mg by mouth every 8 (eight) hours as needed for pain. [provider] Taking Active            Med Note Antony Contras, Jenene Slicker Apr 17, 2021 10:13 AM) Uses twice a day most days.  albuterol (VENTOLIN HFA) 108 (90 Base) MCG/ACT inhaler [id number] Yes INHALE 2 PUFFS INTO THE LUNGS EVERY 6 HOURS AS NEEDED FOR WHEEZING OR SHORTNESS OF BREATH Colon Branch, MD Taking Active   amphetamine-dextroamphetamine (ADDERALL) 10 MG tablet [id number] Yes Take 10 mg by mouth daily with breakfast. [provider] Taking Active            Med Note (CANTER, [facility] D   Tue Dec 20, 2020  7:56 AM)    Ascorbic Acid (VITAMIN C) 1000 MG tablet [id number] Yes Take 1,000 mg by mouth daily. [provider] Taking Active   atorvastatin (LIPITOR) 20 MG tablet [id number] Yes TAKE 1 TABLET(20 MG) BY MOUTH AT BEDTIME Colon Branch, MD Taking Active   azelastine (ASTELIN) 0.1 % nasal spray [id number] Yes Place 1 spray into both nostrils every evening. [provider] Taking Active   benzonatate (TESSALON) 100 MG capsule [id number] Yes TAKE 2 CAPSULES(200 MG) BY MOUTH THREE TIMES DAILY AS NEEDED FOR COUGH Paz, Alda Berthold, MD Taking Active   busPIRone (BUSPAR) 10 MG tablet [id number] Yes Take 10 mg by mouth 2 (two) times daily. [provider] Taking Active   clobetasol ointment (TEMOVATE) 0.05 % [id number]  Apply 1 application topically 2 (two) times daily as needed. For hands [provider]  Active   Coenzyme Q10 100 MG capsule [id number] Yes Take 100 mg by mouth daily.  [provider] Taking Active   desloratadine (CLARINEX) 5 MG tablet [id number] Yes Take 5 mg by mouth daily. [provider] Taking Active   dextromethorphan-guaiFENesin [facility] DM) 30-600 MG 12hr tablet [id number] Yes Take 1 tablet by mouth 2 (two) times daily. [provider] Taking Active   FLUoxetine (PROZAC) 40 MG capsule [id number] Yes Take 1 tablet daily [provider] Taking Active            Med Note (CANTER, Cheri Rous   Tue Apr 12, 2015  2:47 PM)    Fluticasone [place]) [id number] Yes Place into the nose daily as needed. [provider] Taking Active Self  fluticasone-salmeterol (ADVAIR HFA) 662-94 MCG/ACT inhaler [id number] Yes Inhale 2 puffs into the lungs in the morning and at bedtime. [provider] Taking Active   hydrochlorothiazide (HYDRODIURIL) 25 MG tablet [id number] Yes TAKE 1 TABLET(25 MG) BY MOUTH DAILY Colon Branch, MD Taking Active   HYDROcodone-acetaminophen (NORCO/VICODIN) 5-325 MG tablet [id number] No Take 1 tablet by mouth 3 (three) times daily as needed.  Patient not taking: Reported on 04/10/2021   [provider] Not Taking Active            Med Note Norva Pavlov Mar 08, 2020  9:18 AM) Per ortho  Magnesium Gluconate  250 MG TABS [id number] Yes Take 400 mg by mouth daily. [provider] Taking Active   metoprolol succinate (TOPROL-XL) 50 MG 24 hr tablet [id number] Yes TAKE 1 TABLET(50 MG) BY MOUTH DAILY  Patient taking differently: Take 50 mg by mouth daily. IN THE Collins, Alda Berthold, MD Taking Active   montelukast (SINGULAIR) 10 MG tablet [id number] Yes TAKE 1 TABLET(10 MG) BY MOUTH AT BEDTIME Colon Branch, MD Taking Active   OVER THE COUNTER MEDICATION [id number] No Take 1 each by mouth daily as needed. CBD Gummy - for thumb / hand pain  Patient not taking: Reported on 04/17/2021   [provider] Not Taking Active   PAZEO 0.7 % SOLN [id number] Yes Place 1 drop into both eyes as needed. [provider] Taking Active            Med Note Ileene Patrick, Vilma Prader C   Wed Feb 23, 2015 10:50 AM) PRN  pyridOXINE (VITAMIN B-6) 100 MG tablet [id number] Yes Take 400 mg by mouth daily. Super B complex [provider] Taking Active Self  terazosin (HYTRIN) 1 MG capsule [id number] Yes TAKE 3 CAPSULES(3 MG) BY MOUTH DAILY Colon Branch, MD Taking Active   Turmeric 500 MG CAPS [id number] Yes Take 1,000 mg by mouth daily. [provider] Taking Active   Vitamin D, Cholecalciferol, 25 MCG (1000 UT) CAPS [id number] Yes Take 1 capsule by mouth daily. [provider] Taking Active             Patient Active Problem List   Diagnosis Date Noted   Flu-like symptoms 12/23/2020   Chronic back pain 04/30/2018   DJD (degenerative joint disease) 05/22/2017   Snoring 04/03/2016   PCP NOTES >>>>> 02/24/2015   Pain managment 03/08/2014   Anxiety state 07/15/2012   Annual physical exam 07/20/2010   Hyperglycemia 08/11/2008   PROSTATITIS, CHRONIC 03/31/2008   Dyslipidemia 10/13/2007   ALLERGIC RHINITIS 10/17/2006   Essential hypertension 04/09/2006   Extrinsic asthma 04/09/2006    Immunization History  Administered Date(s) Administered   Fluad Quad(high Dose 65+) 12/22/2018,  12/20/2020   H1N1 03/31/2008   Influenza Split 01/22/2011,  01/10/2012   Influenza Whole 01/17/2007, 12/24/2007, 11/17/2009   Influenza, High Dose Seasonal PF 02/23/2015, 01/13/2016, 01/02/2017, 01/16/2018, 03/27/2021   Influenza,inj,Quad PF,6+ Mos 01/20/2013, 03/08/2014, 12/23/2019   PFIZER(Purple Top)SARS-COV-2 Vaccination 05/21/2019, 07/13/2019, 06/01/2020   Pneumococcal Conjugate-13 09/03/2014   Pneumococcal Polysaccharide-23 09/01/2013, 03/27/2021   Td 03/19/2001   Tdap 08/22/2011   Zoster, Live 08/22/2011    Conditions to be addressed/monitored: HTN, HLD, Anxiety, Depression, and pre diabetes; chronic back pain  Care Plan : General Pharmacy (Adult)  Updates made by Cherre Robins, RPH-CPP since 04/18/2021 12:00 AM     Problem: Medication Adherence (Wellness)   Priority: High  Note:   Current Barriers:  Does not contact provider office for questions/concerns Optimization of medication therapy needed with regards to efficacy and cost  Pharmacist Clinical Goal(s):  Over the next 180 days, patient will adhere to plan to optimize therapeutic regimen for asthma, HTN and anxiety / depression as evidenced by report of adherence to recommended medication management changes through collaboration with PharmD and provider.   Interventions: 1:1 collaboration with Colon Branch, MD regarding development and update of comprehensive plan of care as evidenced by provider attestation and co-signature Inter-disciplinary care team collaboration (see longitudinal plan of care) Comprehensive medication review performed; medication list updated in electronic medical record  Hypertension: Variable controlled; blood pressure goal <140/90  BP Readings from Last 3 Encounters:  04/10/21 126/68  03/29/21 (!) 150/70  12/20/20 132/84  Current treatment:  Metoprolol succinate 1m daily Hydrochlorothiazide 225mdaily Current home readings: patient reports he has not been checking blood pressure at  home recently 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 Lots of traveling. Denies hypotensive/hypertensive symptoms Interventions:  Counseled on BP goal of <130/80;  Recommended check blood pressure  2 to 3 times per week and record   Continue current blood pressure medications.   Hyperlipidemia: Controlled; LDL goal <100 current treatment:  Atorvastatin 2073maily Co Enzyme Q10 - 100m40mily   Medications previously tried: none  Current dietary patterns: Patient reports he is trying to limit portion sizes; he has decreased how often he is eating out; Has increased intake of green leafy vegetable.  Ex[facility] has not been [facility] consistently due to work travel. He has been doing Yoga.  Reviewed refill history for atrovastatin - filled 90 day supply 05/18/2020 and 06//08/2020 and 11/09/2020; Patient endorsed he has filled in November. Verified with [facility] atorvastatin (LIPITOR) 20 MG tablet- last filled date 02/01/21 90 DS.  Interventions:  Counseled on ASCVD risk and reason for taking atorvastatin ;  Continue atorvastatin and CoEnzyme Q10 100mg61mly  Reminded patient that exercise goal is at least 150 minutes per week  Patient asked about having an occasional alcoholic drink while taking atorvastatin; advised that 1 or 2 drinks per week would be okay.  Asthma / Allergies: Improving;  Monitored by Pulmonary - MeganJean Rosenthalrts he uses albuterol 1 or 2 times per week.  Current Therapy:  Advair HFA inhaler 115/21mcg76mnhaler 2 puffs into lungs twice daily (maintenance inhaler)  Albuterol inhaler - inhaler 2 puffs into lung up to every 6 hours as needed for wheezing or shortness of breath Astelin nasal spray - use 1 spray in each nostril once a day in the evening Flonase nasal spray - use 1 spray in each nostril as needed for allergies Desloratadine 5mg - 56me 1 tablet daily benzonatate  100mg43m  up to 3 times a day if needed for cough Montelukast 37m daily each evening. Interventions:   Reviewed above medications and discussed dosing Continue to follow up with pulmonology  Depression/Anxiety  Controlled;  current treatment:  Fluoxetine 426mdaily buspirone 102mwice a day amphetamine/dextroamphetamine 30m80mily  Connected with Dr ReddReece Levy mental health support/ needs Interventions:  Recommended continue current therapy and continue to f/u with Dr ReddReece Levy Pre-Diabetes:        Currently controlled with no pharmacotherapy       Diet: patient is trying to limit serving sizes. He has set a weight goal of 215lbs, he currently is at 230lbs.        Exercise: Yoga weekly; taking exercise easy for last few months due to back pain. Interventions:       Recommendations: continue to limit serving sizes and sugar intake Increase physical activity to 150 minutes per week as able  Chronic Back Pain: Not controlled Monitored by Dr RamoNelva Busheived 3 injections to back with a little help.  Has had 1 session of acupuncture and plans 2nd this week. Patient reports little change after first session.  Current therapy: acetaminophen 500mg30mto 6 tabs per day as needed;  Hydrocodone/APAP 5/325mg 37mo every 6 hours as needed (rarely takes) Tumeric 500mg d56m  Interventions:  Reminded patient to limit amount of acetaminophen to 3000mg pe71my. Reminded him that Hydrocodone/APAP 5/325mg con71ms 325mg of a61mminophen  Continue to follow up with Dr Ramos  HeaNelva Bushaintenance:  Recommended receive updated COVID vaccine and Shingrix series. Discussed that Shingrix coverage has changed for 2023 and cost should be lower / $0 compared to price quoted in past of $50 per injection Patient plans to get Shingrix vaccine soon.  He will be due updated Tetanus but not until after 08/2021  Patient Goals/Self-Care Activities Over the next 180 days, patient will:  take medications as  prescribed,  target a minimum of 150 minutes of moderate intensity exercise weekly, and  engage in dietary modifications by limiting serving sizes and intake of sugar (Great job increasing vegetable and limiting fast food / eating out) Check with pharmacy about cost of Shingrix vaccine.  Follow Up Plan: Telephone follow up appointment with care management team member scheduled for:  3 to 6 months       Medication Assistance: None required.  Patient affirms current coverage meets needs.  [id number] [id number] [place] [facility] Commons[phone] (MAIL ORDENew Milford HospitalECTRONICUnionville0 [phone]:  Patient agrees to Care Plan and Follow-up.  Plan: Telephone follow up appointment with care management team member scheduled for:  4 to 5 months  Guled Gahan EckaCherre Robinslinical Pharmacist [place] [facility]

## 2021-04-17 NOTE — Patient Instructions (Signed)
Mr. Kirk Sutton It was a pleasure speaking with you today.  I have attached a summary of our visit today and information about your health goals.   Patient Goals/Self-Care Activities Over the next 180 days, patient will:  take medications as prescribed,  target a minimum of 150 minutes of moderate intensity exercise weekly, and  engage in dietary modifications by limiting serving sizes and intake of sugar (Great job increasing vegetable and limiting fast food / eating out) Check with pharmacy about cost of Shingrix vaccine.  Our next appointment is by telephone on September 14, 2021 at 8:30am  Please call the care guide team at [phone] if you need to cancel or reschedule your appointment.   If you have any questions or concerns, please feel free to contact me either at the phone number below or with a MyChart message.   Keep up the good work!  Cherre Robins, PharmD Clinical Pharmacist [facility] [phone] (direct line)  [phone] (main office number)  CARE PLAN ENTRY  Hypertension BP Readings from Last 3 Encounters:  04/10/21 126/68  03/29/21 (!) 150/70  12/20/20 132/84   Pharmacist Clinical Goal(s): Over the next 90 days, patient will work with PharmD and providers to achieve BP goal <130/80 Current regimen:  Hydrochlorothiazide  25mg  daily each morning Metoprolol succinate 50mg  daily at bedtime Interventions: Check blood pressure 1 to 2 times per week. Counseled on BP goal of <130/80;  Patient self care activities - Over the next 90 days, patient will: Check blood pressure 2 to 3 times  per week and provide at future appointments Ensure daily salt intake < 2300 mg/day  Hyperlipidemia/ASCVD Risk Lab Results  Component Value Date/Time   LDLCALC 34 12/20/2020 08:34 AM   LDLCALC 83 12/23/2019 08:28 AM   Pharmacist Clinical Goal(s): Over the next 90 days, patient will work with PharmD and providers to maintain LDL goal < 100 Current  regimen:  Atorvastatin 20mg  daily Co Enzyme Q10 - 100mg  daily    Interventions: Encouraged patient to restart exercise with goal of at least 150 minutes per week  Patient self care activities - Over the next 90 days, patient will: Continue atorvastatin and CoEnzyme Q10 100mg  daily Exercise with goal of at least 150 minutes per week   Pre-Diabetes Lab Results  Component Value Date/Time   HGBA1C 6.2 12/20/2020 08:34 AM   HGBA1C 6.2 04/26/2020 08:28 AM   Pharmacist Clinical Goal(s): Over the next 90 days, patient will work with PharmD and providers to maintain A1c goal <6.5% Current regimen:  Diet and exercise management   Interventions: Discussed importance exercise Patient self care activities - Over the next 90 days, patient will: Continue to limit sugar. Increase intake of vegetables - especially green leafy vegetables.  Goal for exercise is at least 150 minutes per week.  Maintain a1c less than 6.5%  Asthma / Allergies:  Pharmacist Clinical Goal(s): Over the next 90 days, patient will work with PharmD and providers to improve breathing and allergy symptoms Current regimen:  Albuterol inhaler as needed for wheezing or shortness of breath Advair HFA 115/21 inhale 2 puffs twice a day Astelin nasal spray as needed Flonase nasal spray as needed Montelukast 10mg  daily at night  Desloratidine 5mg  daily  Pazeo Solution - 1 drop in both eyes as needed Interventions: Reviewed above medications and dosing Patient self care activities - Over the next 90 days, patient will: Use maintenance inhalers daily - Advair  Health maintenance:  Interventions: Reviewed vaccination histroy  Patient self care activities - Over the next 90 days, patient will: Check with pharmacy about cost of Shingrix vaccine for 2023 - should be lower cost than in past.  Get Shingrix Vaccine Will get due to get updated Tetanus vaccine after 08/2021  Medication management Pharmacist Clinical Goal(s): Over  the next 90 days, patient will work with PharmD and providers to maintain optimal medication adherence [facility] Interventions Comprehensive medication review performed. Continue current medication management strategy Patient self care activities - Over the next 90 days, patient will: Focus on medication adherence by filling and taking medications appropriately  Take medications as prescribed Report any questions or concerns to PharmD and/or provider(s)    Patient verbalizes understanding of instructions and care plan provided today and agrees to view in [place]. Active MyChart status confirmed with patient.

## 2021-04-18 DIAGNOSIS — I10 Essential (primary) hypertension: Secondary | ICD-10-CM

## 2021-04-18 DIAGNOSIS — E785 Hyperlipidemia, unspecified: Secondary | ICD-10-CM

## 2021-04-18 DIAGNOSIS — J4541 Moderate persistent asthma with (acute) exacerbation: Secondary | ICD-10-CM | POA: Diagnosis not present

## 2021-04-20 ENCOUNTER — Ambulatory Visit (INDEPENDENT_AMBULATORY_CARE_PROVIDER_SITE_OTHER): Payer: Medicare (Managed Care)

## 2021-04-20 VITALS — BP 132/80 | HR 57 | Temp 98.2°F | Resp 16 | Ht 71.0 in | Wt 235.8 lb

## 2021-04-20 DIAGNOSIS — Z0000 Encounter for general adult medical examination without abnormal findings: Secondary | ICD-10-CM | POA: Diagnosis not present

## 2021-04-20 NOTE — Progress Notes (Addendum)
Subjective:   Kirk Sutton is a 71 y.o. male who presents for Medicare Annual/Subsequent preventive examination.  Review of Systems     Cardiac Risk Factors include: advanced age (>50men, >18 women);hypertension;dyslipidemia;obesity (BMI >30kg/m2)     Objective:    Today's Vitals   04/20/21 0819  BP: 132/80  Pulse: (!) 57  Resp: 16  Temp: 98.2 F (36.8 C)  TempSrc: Oral  SpO2: 95%  Weight: 235 lb 12.8 oz (107 kg)  Height: 5\' 11"  (1.803 m)   Body mass index is 32.89 kg/m.  Advanced Directives 04/20/2021 04/14/2020 03/27/2019 12/24/2016 11/11/2015  Does Patient Have a Medical Advance Directive? Yes Yes Yes Yes Yes  Type of Paramedic of [place];Living will [place];Living will [place];Living will [place];Living will Living will  Does patient want to make changes to medical advance directive? - - No - Patient declined - -  Copy of [place] in Chart? No - copy requested No - copy requested No - copy requested - No - copy requested    Current Medications (verified) Outpatient Encounter Medications as of 04/20/2021  Medication Sig   acetaminophen (TYLENOL) 650 MG CR tablet Take 650 mg by mouth every 8 (eight) hours as needed for pain.   albuterol (VENTOLIN HFA) 108 (90 Base) MCG/ACT inhaler INHALE 2 PUFFS INTO THE LUNGS EVERY 6 HOURS AS NEEDED FOR WHEEZING OR SHORTNESS OF BREATH   amphetamine-dextroamphetamine (ADDERALL) 10 MG tablet Take 10 mg by mouth daily with breakfast.   Ascorbic Acid (VITAMIN C) 1000 MG tablet Take 1,000 mg by mouth daily.   atorvastatin (LIPITOR) 20 MG tablet TAKE 1 TABLET(20 MG) BY MOUTH AT BEDTIME   azelastine (ASTELIN) 0.1 % nasal spray Place 1 spray into both nostrils every evening.   benzonatate (TESSALON) 100 MG capsule TAKE 2 CAPSULES(200 MG) BY MOUTH THREE TIMES DAILY AS NEEDED FOR COUGH   busPIRone (BUSPAR) 10 MG tablet Take 10 mg by mouth 2  (two) times daily.   clobetasol ointment (TEMOVATE) 1.57 % Apply 1 application topically 2 (two) times daily as needed. For hands   Coenzyme Q10 100 MG capsule Take 100 mg by mouth daily.   desloratadine (CLARINEX) 5 MG tablet Take 5 mg by mouth daily.   dextromethorphan-guaiFENesin (MUCINEX DM) 30-600 MG 12hr tablet Take 1 tablet by mouth 2 (two) times daily.   FLUoxetine (PROZAC) 40 MG capsule Take 1 tablet daily   Fluticasone Furoate (FLONASE SENSIMIST NA) Place into the nose daily as needed.   fluticasone-salmeterol (ADVAIR HFA) 115-21 MCG/ACT inhaler Inhale 2 puffs into the lungs in the morning and at bedtime.   hydrochlorothiazide (HYDRODIURIL) 25 MG tablet TAKE 1 TABLET(25 MG) BY MOUTH DAILY   Magnesium Gluconate 250 MG TABS Take 400 mg by mouth daily.   metoprolol succinate (TOPROL-XL) 50 MG 24 hr tablet TAKE 1 TABLET(50 MG) BY MOUTH DAILY (Patient taking differently: Take 50 mg by mouth daily. IN THE EVENING)   montelukast (SINGULAIR) 10 MG tablet TAKE 1 TABLET(10 MG) BY MOUTH AT BEDTIME   OVER THE COUNTER MEDICATION Take 1 each by mouth daily as needed. CBD Gummy - for thumb / hand pain   PAZEO 0.7 % SOLN Place 1 drop into both eyes as needed.   pyridOXINE (VITAMIN B-6) 100 MG tablet Take 400 mg by mouth daily. Super B complex   terazosin (HYTRIN) 1 MG capsule TAKE 3 CAPSULES(3 MG) BY MOUTH DAILY   Turmeric 500  MG CAPS Take 1,000 mg by mouth daily.   Vitamin D, Cholecalciferol, 25 MCG (1000 UT) CAPS Take 1 capsule by mouth daily.   HYDROcodone-acetaminophen (NORCO/VICODIN) 5-325 MG tablet Take 1 tablet by mouth 3 (three) times daily as needed. (Patient not taking: Reported on 04/10/2021)   No facility-administered encounter medications on file as of 04/20/2021.    Allergies (verified) Cetirizine hcl and Fexofenadine   History: Past Medical History:  Diagnosis Date   Allergic rhinitis    Asthma    Chronic prostatitis    sees urology   Diabetes mellitus    type 11 (a1c 6.0  03/2008)/no meds.   Hernia, umbilical    Hyperlipidemia    Hypertension    Psoriasis    @ hands, sees derm   Past Surgical History:  Procedure Laterality Date   Pax     NASAL SEPTUM SURGERY  2009   SHOULDER SURGERY  2008   left   TESTICLE SURGERY     undescended repair    Family History  Problem Relation Age of Onset   Diabetes Sister    Hypertension Mother    Colon polyps Mother    Hypertension Sister    Heart attack Father        x 3 onset age 66, died at 52   Colon polyps Father    Cancer Father        bladder   Colon cancer Neg Hx    Prostate cancer Neg Hx    Stomach cancer Neg Hx    Social History   Socioeconomic History   Marital status: Married    Spouse name: Not on file   Number of children: 2   Years of education: Not on file   Highest education level: Not on file  Occupational History   Occupation: Administrator, arts  Tobacco Use   Smoking status: Never   Smokeless tobacco: Never  Vaping Use   Vaping Use: Never used  Substance and Sexual Activity   Alcohol use: Yes    Comment: socially, rare   Drug use: No   Sexual activity: Yes  Other Topics Concern   Not on file  Social History Narrative   Household-- pt, wife    Social Determinants of Health   Financial Resource Strain: Low Risk    Difficulty of Paying Living Expenses: Not hard at all  Food Insecurity: No Food Insecurity   Worried About Charity fundraiser in the Last Year: Never true   Arboriculturist in the Last Year: Never true  Transportation Needs: No Transportation Needs   Lack of Transportation (Medical): No   Lack of Transportation (Non-Medical): No  Physical Activity: Insufficiently Active   Days of Exercise per Week: 1 day   Minutes of Exercise per Session: 20 min  Stress: No Stress Concern Present   Feeling of Stress : Not at all  Social Connections: Socially Integrated   Frequency of  Communication with Friends and Family: More than three times a week   Frequency of Social Gatherings with Friends and Family: More than three times a week   Attends Religious Services: More than 4 times per year   Active Member of [employer] or Organizations: Yes   Attends Archivist Meetings: 1 to 4 times per year   Marital Status: Married    Tobacco Counseling Counseling given: Not Answered   Clinical Intake:  Pre-visit preparation completed: Yes  Pain : No/denies pain     BMI - recorded: 32.89 Nutritional Status: BMI > 30  Obese Nutritional Risks: None Diabetes: No  How often do you need to have someone help you when you read instructions, pamphlets, or other written materials from your doctor or pharmacy?: 1 - Never  Diabetic?No  Interpreter Needed?: No  Information entered by :: Caroleen Hamman LPn   Activities of Daily Living In your present state of health, do you have any difficulty performing the following activities: 04/20/2021  Hearing? N  Vision? N  Difficulty concentrating or making decisions? N  Walking or climbing stairs? N  Dressing or bathing? N  Doing errands, shopping? N  Preparing Food and eating ? N  Using the Toilet? N  In the past six months, have you accidently leaked urine? N  Do you have problems with loss of bowel control? N  Managing your Medications? N  Managing your Finances? N  Housekeeping or managing your Housekeeping? N  Some recent data might be hidden    Patient Care Team: Colon Branch, MD as PCP - General Thomasene Mohair Verner Chol., MD as Consulting Physician (Urology) Calvert Cantor, MD as Consulting Physician (Ophthalmology) Orbie Hurst, MD as Consulting Physician (Dermatology) Gardiner Barefoot, DPM as Consulting Physician (Podiatry) Roseanne Kaufman, MD as Consulting Physician (Orthopedic Surgery) Ricard Dillon, MD (Psychiatry) Suella Broad, MD as Consulting Physician (Physical Medicine and  Rehabilitation) Tiajuana Amass, MD as Referring Physician (Allergy and Immunology) Cherre Robins, RPH-CPP (Pharmacist)  Indicate any recent Medical Services you may have received from other than [facility] providers in the past year (date may be approximate).     Assessment:   This is a routine wellness examination for Kirk Sutton.  Hearing/Vision screen Hearing Screening - Comments:: No issues Vision Screening - Comments:: Last eye exam-2 months ago-Dr. Jerline Pain  Dietary issues and exercise activities discussed: Current Exercise Habits: The patient does not participate in regular exercise at present, Exercise limited by: None identified   Goals Addressed             This Visit's Progress    DIET - INCREASE WATER INTAKE   On track    Increase physical activity   Not on track      Depression Screen PHQ 2/9 Scores 04/20/2021 12/20/2020 04/14/2020 03/31/2020 03/27/2019 04/29/2018 05/22/2017  PHQ - 2 Score 0 0 0 0 0 1 0  PHQ- 9 Score - - - - - 2 -    Fall Risk Fall Risk  04/20/2021 12/20/2020 04/14/2020 03/31/2020 03/27/2019  Falls in the past year? 0 0 0 0 0  Number falls in past yr: 0 0 0 0 -  Injury with Fall? 0 0 0 0 -  Follow up Falls prevention discussed - Falls prevention discussed - Education provided;Falls prevention discussed    FALL RISK PREVENTION PERTAINING TO THE HOME:  Any stairs in or around the home? Yes  If so, are there any without handrails? No  Home free of loose throw rugs in walkways, pet beds, electrical cords, etc? Yes  Adequate lighting in your home to reduce risk of falls? Yes   ASSISTIVE DEVICES UTILIZED TO PREVENT FALLS:  Life alert? No  Use of a cane, walker or w/c? No  Grab bars in the bathroom? Yes  Shower chair or bench in shower? Yes  Elevated toilet seat or a handicapped toilet? No   TIMED UP AND GO:  Was the test performed? Yes .  Length  of time to ambulate 10 feet: 10 sec.   Gait steady and fast without use of assistive device  Cognitive Function:Normal  cognitive status assessed by direct observation by this Nurse Health Advisor. No abnormalities found.   MMSE - Mini Mental State Exam 11/11/2015  Orientation to time 5  Orientation to Place 5  Registration 3  Attention/ Calculation 5  Recall 3  Language- name 2 objects 2  Language- repeat 1  Language- follow 3 step command 3  Language- read & follow direction 1  Write a sentence 1  Copy design 1  Total score 30        Immunizations Immunization History  Administered Date(s) Administered   Fluad Quad(high Dose 65+) 12/22/2018, 12/20/2020   H1N1 03/31/2008   Influenza Split 01/22/2011, 01/10/2012   Influenza Whole 01/17/2007, 12/24/2007, 11/17/2009   Influenza, High Dose Seasonal PF 02/23/2015, 01/13/2016, 01/02/2017, 01/16/2018, 03/27/2021   Influenza,inj,Quad PF,6+ Mos 01/20/2013, 03/08/2014, 12/23/2019   PFIZER(Purple Top)SARS-COV-2 Vaccination 05/21/2019, 07/13/2019, 06/01/2020   Pneumococcal Conjugate-13 09/03/2014   Pneumococcal Polysaccharide-23 09/01/2013, 03/27/2021   Td 03/19/2001   Tdap 08/22/2011   Zoster, Live 08/22/2011    TDAP status: Up to date  Flu Vaccine status: Up to date  Pneumococcal vaccine status: Up to date  Covid-19 vaccine status: Information provided on how to obtain vaccines.   Qualifies for Shingles Vaccine? Yes   Zostavax completed Yes   Shingrix Completed?: No.    Education has been provided regarding the importance of this vaccine. Patient has been advised to call insurance company to determine out of pocket expense if they have not yet received this vaccine. Advised may also receive vaccine at local pharmacy or Health Dept. Verbalized acceptance and understanding.  Screening Tests Health Maintenance  Topic Date Due   Zoster Vaccines- Shingrix (1 of 2) Never done   COVID-19 Vaccine (4 - Booster for Pfizer series) 07/27/2020   COLONOSCOPY (Pts 45-45yrs Insurance coverage will need to be confirmed)  04/26/2021 (Originally 02/19/2020)    TETANUS/TDAP  08/21/2021   Pneumonia Vaccine 65+ Years old  Completed   INFLUENZA VACCINE  Completed   Hepatitis C Screening  Completed   HPV VACCINES  Aged Out    Health Maintenance  Health Maintenance Due  Topic Date Due   Zoster Vaccines- Shingrix (1 of 2) Never done   COVID-19 Vaccine (4 - Booster for Pfizer series) 07/27/2020    Colorectal cancer screening: Due-Patient will call GI to schedule.  Lung Cancer Screening: (Low Dose CT Chest recommended if Age 69-80 years, 30 pack-year currently smoking OR have quit w/in 15years.) does not qualify.    Additional Screening:  Hepatitis C Screening: Completed 02/23/2015  Vision Screening: Recommended annual ophthalmology exams for early detection of glaucoma and other disorders of the eye. Is the patient up to date with their annual eye exam?  Yes  Who is the provider or what is the name of the office in which the patient attends annual eye exams? Dr. Jerline Pain   Dental Screening: Recommended annual dental exams for proper oral hygiene  Community Resource Referral / Chronic Care Management: CRR required this visit?  No   CCM required this visit?  No      Plan:     I have personally reviewed and noted the following in the patients chart:   Medical and social history Use of alcohol, tobacco or illicit drugs  Current medications and supplements including opioid prescriptions. Patient is not currently taking opioid prescriptions. Functional ability and status Nutritional  status Physical activity Advanced directives List of other physicians Hospitalizations, surgeries, and ER visits in previous 12 months Vitals Screenings to include cognitive, depression, and falls Referrals and appointments  In addition, I have reviewed and discussed with patient certain preventive protocols, quality metrics, and best practice recommendations. A written personalized care plan for preventive services as well as general preventive health  recommendations were provided to patient.   Patient would like to access avs on mychart.  Marta Antu, LPN   07/19/8411  Nurse Health Advisor  Nurse Notes: None  I have reviewed and agree with Health Coaches documentation.  Kathlene November, MD

## 2021-04-20 NOTE — Patient Instructions (Signed)
Mr. Kirk Sutton , Thank you for taking time to come for your Medicare Wellness Visit. I appreciate your ongoing commitment to your health goals. Please review the following plan we discussed and let me know if I can assist you in the future.   Screening recommendations/referrals: Colonoscopy: Due-Per our conversation, you will call GI to schedule. Recommended yearly ophthalmology/optometry visit for glaucoma screening and checkup Recommended yearly dental visit for hygiene and checkup  Vaccinations: Influenza vaccine: Up to date Pneumococcal vaccine: Up to date Tdap vaccine: Due-08/2021 Shingles vaccine: Due-May obtain vaccine at your local pharmacy. Covid-19: Booster available at the pharmacy  Advanced directives: Please bring a copy of Living Will and/or Healthcare Power of Attorney for your chart.   Conditions/risks identified: See problem list  Next appointment: Follow up in one year for your annual wellness visit. 04/26/2022 @ 8:20.  Preventive Care 73 Years and Older, Male Preventive care refers to lifestyle choices and visits with your health care provider that can promote health and wellness. What does preventive care include? A yearly physical exam. This is also called an annual well check. Dental exams once or twice a year. Routine eye exams. Ask your health care provider how often you should have your eyes checked. Personal lifestyle choices, including: Daily care of your teeth and gums. Regular physical activity. Eating a healthy diet. Avoiding tobacco and drug use. Limiting alcohol use. Practicing safe sex. Taking low doses of aspirin every day. Taking vitamin and mineral supplements as recommended by your health care provider. What happens during an annual well check? The services and screenings done by your health care provider during your annual well check will depend on your age, overall health, lifestyle risk factors, and family history of disease. Counseling  Your  health care provider may ask you questions about your: Alcohol use. Tobacco use. Drug use. Emotional well-being. Home and relationship well-being. Sexual activity. Eating habits. History of falls. Memory and ability to understand (cognition). Work and work Statistician. Screening  You may have the following tests or measurements: Height, weight, and BMI. Blood pressure. Lipid and cholesterol levels. These may be checked every 5 years, or more frequently if you are over 65 years old. Skin check. Lung cancer screening. You may have this screening every year starting at age 35 if you have a 30-pack-year history of smoking and currently smoke or have quit within the past 15 years. Fecal occult blood test (FOBT) of the stool. You may have this test every year starting at age 73. Flexible sigmoidoscopy or colonoscopy. You may have a sigmoidoscopy every 5 years or a colonoscopy every 10 years starting at age 73. Prostate cancer screening. Recommendations will vary depending on your family history and other risks. Hepatitis C blood test. Hepatitis B blood test. Sexually transmitted disease (STD) testing. Diabetes screening. This is done by checking your blood sugar (glucose) after you have not eaten for a while (fasting). You may have this done every 1-3 years. Abdominal aortic aneurysm (AAA) screening. You may need this if you are a current or former smoker. Osteoporosis. You may be screened starting at age 73 if you are at high risk. Talk with your health care provider about your test results, treatment options, and if necessary, the need for more tests. Vaccines  Your health care provider may recommend certain vaccines, such as: Influenza vaccine. This is recommended every year. Tetanus, diphtheria, and acellular pertussis (Tdap, Td) vaccine. You may need a Td booster every 10 years. Zoster vaccine. You may need  this after age 73. Pneumococcal 13-valent conjugate (PCV13) vaccine. One dose  is recommended after age 73. Pneumococcal polysaccharide (PPSV23) vaccine. One dose is recommended after age 73. Talk to your health care provider about which screenings and vaccines you need and how often you need them. This information is not intended to replace advice given to you by your health care provider. Make sure you discuss any questions you have with your health care provider. Document Released: 04/01/2015 Document Revised: 11/23/2015 Document Reviewed: 01/04/2015 Elsevier Interactive Patient Education  2017 [place] Prevention in the Home Falls can cause injuries. They can happen to people of all ages. There are many things you can do to make your home safe and to help prevent falls. What can I do on the outside of my home? Regularly fix the edges of walkways and driveways and fix any cracks. Remove anything that might make you trip as you walk through a door, such as a raised step or threshold. Trim any bushes or trees on the path to your home. Use bright outdoor lighting. Clear any walking paths of anything that might make someone trip, such as rocks or tools. Regularly check to see if handrails are loose or broken. Make sure that both sides of any steps have handrails. Any raised decks and porches should have guardrails on the edges. Have any leaves, snow, or ice cleared regularly. Use sand or salt on walking paths during winter. Clean up any spills in your garage right away. This includes oil or grease spills. What can I do in the bathroom? Use night lights. Install grab bars by the toilet and in the tub and shower. Do not use towel bars as grab bars. Use non-skid mats or decals in the tub or shower. If you need to sit down in the shower, use a plastic, non-slip stool. Keep the floor dry. Clean up any water that spills on the floor as soon as it happens. Remove soap buildup in the tub or shower regularly. Attach bath mats securely with double-sided non-slip rug  tape. Do not have throw rugs and other things on the floor that can make you trip. What can I do in the bedroom? Use night lights. Make sure that you have a light by your bed that is easy to reach. Do not use any sheets or blankets that are too big for your bed. They should not hang down onto the floor. Have a firm chair that has side arms. You can use this for support while you get dressed. Do not have throw rugs and other things on the floor that can make you trip. What can I do in the kitchen? Clean up any spills right away. Avoid walking on wet floors. Keep items that you use a lot in easy-to-reach places. If you need to reach something above you, use a strong step stool that has a grab bar. Keep electrical cords out of the way. Do not use floor polish or wax that makes floors slippery. If you must use wax, use non-skid floor wax. Do not have throw rugs and other things on the floor that can make you trip. What can I do with my stairs? Do not leave any items on the stairs. Make sure that there are handrails on both sides of the stairs and use them. Fix handrails that are broken or loose. Make sure that handrails are as long as the stairways. Check any carpeting to make sure that it is firmly attached to  the stairs. Fix any carpet that is loose or worn. Avoid having throw rugs at the top or bottom of the stairs. If you do have throw rugs, attach them to the floor with carpet tape. Make sure that you have a light switch at the top of the stairs and the bottom of the stairs. If you do not have them, ask someone to add them for you. What else can I do to help prevent falls? Wear shoes that: Do not have high heels. Have rubber bottoms. Are comfortable and fit you well. Are closed at the toe. Do not wear sandals. If you use a stepladder: Make sure that it is fully opened. Do not climb a closed stepladder. Make sure that both sides of the stepladder are locked into place. Ask someone to  hold it for you, if possible. Clearly mark and make sure that you can see: Any grab bars or handrails. First and last steps. Where the edge of each step is. Use tools that help you move around (mobility aids) if they are needed. These include: Canes. Walkers. Scooters. Crutches. Turn on the lights when you go into a dark area. Replace any light bulbs as soon as they burn out. Set up your furniture so you have a clear path. Avoid moving your furniture around. If any of your floors are uneven, fix them. If there are any pets around you, be aware of where they are. Review your medicines with your doctor. Some medicines can make you feel dizzy. This can increase your chance of falling. Ask your doctor what other things that you can do to help prevent falls. This information is not intended to replace advice given to you by your health care provider. Make sure you discuss any questions you have with your health care provider. Document Released: 12/30/2008 Document Revised: 08/11/2015 Document Reviewed: 04/09/2014 Elsevier Interactive Patient Education  2017 [employer].

## 2021-04-29 ENCOUNTER — Other Ambulatory Visit: Payer: Self-pay | Admitting: Internal Medicine

## 2021-05-22 DIAGNOSIS — M5136 Other intervertebral disc degeneration, lumbar region: Secondary | ICD-10-CM | POA: Diagnosis not present

## 2021-06-15 DIAGNOSIS — M5416 Radiculopathy, lumbar region: Secondary | ICD-10-CM | POA: Diagnosis not present

## 2021-06-19 ENCOUNTER — Ambulatory Visit: Payer: Medicare (Managed Care) | Admitting: Internal Medicine

## 2021-06-19 ENCOUNTER — Encounter: Payer: Self-pay | Admitting: Internal Medicine

## 2021-06-19 VITALS — BP 128/68 | HR 90 | Temp 98.0°F | Resp 18 | Ht 71.0 in | Wt 233.0 lb

## 2021-06-19 DIAGNOSIS — R6889 Other general symptoms and signs: Secondary | ICD-10-CM

## 2021-06-19 DIAGNOSIS — I10 Essential (primary) hypertension: Secondary | ICD-10-CM | POA: Diagnosis not present

## 2021-06-19 DIAGNOSIS — J45991 Cough variant asthma: Secondary | ICD-10-CM | POA: Diagnosis not present

## 2021-06-19 LAB — BASIC METABOLIC PANEL
BUN: 22 mg/dL (ref 6–23)
CO2: 30 mEq/L (ref 19–32)
Calcium: 9.3 mg/dL (ref 8.4–10.5)
Chloride: 100 mEq/L (ref 96–112)
Creatinine, Ser: 1.17 mg/dL (ref 0.40–1.50)
GFR: 61.97 mL/min (ref >60.00)
Glucose, Bld: 127 mg/dL — ABNORMAL HIGH (ref 70–99)
Potassium: 3.7 mEq/L (ref 3.5–5.1)
Sodium: 137 mEq/L (ref 135–145)

## 2021-06-19 MED ORDER — BENZONATATE 100 MG PO CAPS: ORAL_CAPSULE | ORAL | 0 refills | Status: DC

## 2021-06-19 NOTE — Patient Instructions (Addendum)
Come to gastroenterology to set your next colonoscopy. ?[phone] ? ? ?Check the  blood pressure regularly ?BP GOAL is between 110/65 and  135/85. ?If it is consistently higher or lower, let me know ? ?Vaccines I recommend: ?- Tetanus shot ("Tdap") ?- Shingrix ? ? ?GO TO THE LAB : Get the blood work   ? ? ?[place], [place] ?Come back for a physical exam by 12-2021 ?

## 2021-06-19 NOTE — Assessment & Plan Note (Signed)
HTN: seems controlled, continue metoprolol, HCTZ.  Check BMP. ?Cough variant asthma: Good compliance with Advair, uses albuterol as needed, allergies well controlled.  Reports she sees Dr. Ova Freshwater, allergist.  Vevelyn Pat. ?MSK: Having back pain, recently developed left shoulder pain.  Follow-up by Dr. Herma Mering, plans to see a neurosurgeon for back pain and Dr. Onnie Graham for left shoulder pain.  On hydrocodone prescribed elsewhere. ?Preventive care: Recommended Tdap and Shingrix. ?Declines COVID-vaccine. ?RTC 12-2021 CPX ?

## 2021-06-19 NOTE — Progress Notes (Signed)
? ?Subjective:  ? ? Patient ID: Kirk Sutton, male    DOB: 03/15/1951, 71 y.o.   MRN: [id number] ? ?DOS:  06/19/2021 ?Type of visit - description: Routine follow-up ? ?States he feeling great. ?Today we talk about hypertension, preventive care, MSK issues. ?Continue with back pain, has developed left shoulder pain.   ?Respiratory symptoms are well controlled ? ?Review of Systems ?See above  ? ?Past Medical History:  ?Diagnosis Date  ? Allergic rhinitis   ? Asthma   ? Chronic prostatitis   ? sees urology  ? Diabetes mellitus   ? type 11 (a1c 6.0 03/2008)/no meds.  ? Hernia, umbilical   ? Hyperlipidemia   ? Hypertension   ? Psoriasis   ? @ hands, sees derm  ? ? ?Past Surgical History:  ?Procedure Laterality Date  ? APPENDECTOMY    ? FERTILITY SURGERY  1979  ? HERNIA REPAIR    ? NASAL SEPTUM SURGERY  2009  ? SHOULDER SURGERY  2008  ? left  ? TESTICLE SURGERY    ? undescended repair   ? ? ?Current Outpatient Medications  ?Medication Instructions  ? acetaminophen (TYLENOL) 650 mg, Oral, Every 8 hours PRN  ? albuterol (VENTOLIN HFA) 108 (90 Base) MCG/ACT inhaler INHALE 2 PUFFS INTO THE LUNGS EVERY 6 HOURS AS NEEDED FOR WHEEZING OR SHORTNESS OF BREATH  ? amphetamine-dextroamphetamine (ADDERALL) 10 MG tablet 10 mg, Oral, Daily with breakfast  ? atorvastatin (LIPITOR) 20 MG tablet TAKE 1 TABLET(20 MG) BY MOUTH AT BEDTIME  ? azelastine (ASTELIN) 0.1 % nasal spray 1 spray, Each Nare, Every evening  ? benzonatate (TESSALON) 100 MG capsule TAKE 2 CAPSULES(200 MG) BY MOUTH THREE TIMES DAILY AS NEEDED FOR COUGH  ? busPIRone (BUSPAR) 10 mg, Oral, 2 times daily  ? clobetasol ointment (TEMOVATE) 4.25 % 1 application., Topical, 2 times daily PRN, For hands   ? Coenzyme Q10 100 mg, Oral, Daily  ? desloratadine (CLARINEX) 5 mg, Oral, Daily  ? dextromethorphan-guaiFENesin (MUCINEX DM) 30-600 MG 12hr tablet 1 tablet, Oral, 2 times daily  ? FLUoxetine (PROZAC) 40 MG capsule Take 1 tablet daily  ? Fluticasone Furoate (FLONASE SENSIMIST NA)  Nasal, Daily PRN  ? fluticasone-salmeterol (ADVAIR HFA) 115-21 MCG/ACT inhaler 2 puffs, Inhalation, 2 times daily  ? hydrochlorothiazide (HYDRODIURIL) 25 MG tablet TAKE 1 TABLET(25 MG) BY MOUTH DAILY  ? HYDROcodone-acetaminophen (NORCO/VICODIN) 5-325 MG tablet 1 tablet, Oral, 3 times daily PRN  ? Magnesium Gluconate 400 mg, Oral, Daily  ? metoprolol succinate (TOPROL-XL) 50 MG 24 hr tablet TAKE 1 TABLET(50 MG) BY MOUTH DAILY  ? montelukast (SINGULAIR) 10 MG tablet TAKE 1 TABLET(10 MG) BY MOUTH AT BEDTIME  ? OVER THE COUNTER MEDICATION 1 each, Oral, Daily PRN, CBD Gummy - for thumb / hand pain  ? PAZEO 0.7 % SOLN 1 drop, Both Eyes, As needed  ? pyridOXINE (VITAMIN B-6) 400 mg, Oral, Daily, Super B complex  ? terazosin (HYTRIN) 1 MG capsule TAKE 3 CAPSULES(3 MG) BY MOUTH DAILY  ? Turmeric 1,000 mg, Oral, Daily  ? vitamin C 1,000 mg, Oral, Daily  ? Vitamin D, Cholecalciferol, 25 MCG (1000 UT) CAPS 1 capsule, Oral, Daily  ? ? ?   ?Objective:  ? Physical Exam ?BP 128/68 (BP Location: Left Arm, Patient Position: Sitting, Cuff Size: Normal)   Pulse 90   Temp 98 ?F (36.7 ?C) (Oral)   Resp 18   Ht '5\' 11"'$  (1.803 m)   Wt 233 lb (105.7 kg)   SpO2 99%  BMI 32.50 kg/m?  ?General:   ?Well developed, NAD, BMI noted. ?HEENT:  ?Normocephalic . Face symmetric, atraumatic ?Lungs:  ?CTA B ?Normal respiratory effort, no intercostal retractions, no accessory muscle use. ?Heart: RRR,  no murmur.  ?Lower extremities: no pretibial edema bilaterally  ?Skin: Not pale. Not jaundice ?Neurologic:  ?alert & oriented X3.  ?Speech normal, gait appropriate for age and unassisted ?Psych--  ?Cognition and judgment appear intact.  ?Cooperative with normal attention span and concentration.  ?Behavior appropriate. ?No anxious or depressed appearing.  ? ?   ?Assessment   ? ?  Assessment ?Prediabetes  ?HTN ?Hyperlipidemia ?Anxiety Dr Fay Records (psych)   ?Cough variant asthma, atopic dermatitis, allergic rhinitis and allergic conjunctivitis.Dr  Ova Freshwater ?MSK:  ?--Chronic back pain, pain medication as needed, used to see Dr Nelva Bush, did PT @ [facility] [phone], better ?--DJD- hands ?Chronic prostatitis, sees urology ?Psoriasis, hands, sees dermatology ?Snoring (severe per pt)- dentist Rx a moth guard ~ 08-2015, helps ? ?PLAN: ?HTN: seems controlled, continue metoprolol, HCTZ.  Check BMP. ?Cough variant asthma: Good compliance with Advair, uses albuterol as needed, allergies well controlled.  Reports she sees Dr. Ova Freshwater, allergist.  Vevelyn Pat. ?MSK: Having back pain, recently developed left shoulder pain.  Follow-up by Dr. Herma Mering, plans to see a neurosurgeon for back pain and Dr. Onnie Graham for left shoulder pain.  On hydrocodone prescribed elsewhere. ?Preventive care: Recommended Tdap and Shingrix. ?Declines COVID-vaccine. ?RTC 12-2021 CPX ?  ?This visit occurred during the SARS-CoV-2 public health emergency.  Safety protocols were in place, including screening questions prior to the visit, additional usage of staff PPE, and extensive cleaning of exam room while observing appropriate contact time as indicated for disinfecting solutions.  ? ?

## 2021-06-20 DIAGNOSIS — K420 Umbilical hernia with obstruction, without gangrene: Secondary | ICD-10-CM | POA: Diagnosis not present

## 2021-06-20 DIAGNOSIS — I10 Essential (primary) hypertension: Secondary | ICD-10-CM | POA: Diagnosis not present

## 2021-06-22 DIAGNOSIS — K420 Umbilical hernia with obstruction, without gangrene: Secondary | ICD-10-CM | POA: Insufficient documentation

## 2021-06-26 DIAGNOSIS — M19012 Primary osteoarthritis, left shoulder: Secondary | ICD-10-CM | POA: Diagnosis not present

## 2021-07-28 ENCOUNTER — Other Ambulatory Visit: Payer: Self-pay | Admitting: Internal Medicine

## 2021-08-02 DIAGNOSIS — F331 Major depressive disorder, recurrent, moderate: Secondary | ICD-10-CM | POA: Diagnosis not present

## 2021-08-02 DIAGNOSIS — M19012 Primary osteoarthritis, left shoulder: Secondary | ICD-10-CM | POA: Diagnosis not present

## 2021-08-02 DIAGNOSIS — F900 Attention-deficit hyperactivity disorder, predominantly inattentive type: Secondary | ICD-10-CM | POA: Diagnosis not present

## 2021-08-22 DIAGNOSIS — K420 Umbilical hernia with obstruction, without gangrene: Secondary | ICD-10-CM | POA: Diagnosis not present

## 2021-08-22 DIAGNOSIS — R001 Bradycardia, unspecified: Secondary | ICD-10-CM | POA: Diagnosis not present

## 2021-08-22 DIAGNOSIS — I444 Left anterior fascicular block: Secondary | ICD-10-CM | POA: Diagnosis not present

## 2021-08-22 DIAGNOSIS — Z01818 Encounter for other preprocedural examination: Secondary | ICD-10-CM | POA: Diagnosis not present

## 2021-08-23 DIAGNOSIS — I444 Left anterior fascicular block: Secondary | ICD-10-CM | POA: Diagnosis not present

## 2021-08-27 ENCOUNTER — Other Ambulatory Visit: Payer: Self-pay | Admitting: Internal Medicine

## 2021-08-27 DIAGNOSIS — I10 Essential (primary) hypertension: Secondary | ICD-10-CM

## 2021-08-28 DIAGNOSIS — K420 Umbilical hernia with obstruction, without gangrene: Secondary | ICD-10-CM | POA: Diagnosis not present

## 2021-08-28 DIAGNOSIS — Z79899 Other long term (current) drug therapy: Secondary | ICD-10-CM | POA: Diagnosis not present

## 2021-08-28 DIAGNOSIS — F419 Anxiety disorder, unspecified: Secondary | ICD-10-CM | POA: Diagnosis not present

## 2021-08-28 DIAGNOSIS — I10 Essential (primary) hypertension: Secondary | ICD-10-CM | POA: Diagnosis not present

## 2021-08-28 HISTORY — PX: UMBILICAL HERNIA REPAIR: SHX196

## 2021-09-07 DIAGNOSIS — L821 Other seborrheic keratosis: Secondary | ICD-10-CM | POA: Diagnosis not present

## 2021-09-07 DIAGNOSIS — D224 Melanocytic nevi of scalp and neck: Secondary | ICD-10-CM | POA: Diagnosis not present

## 2021-09-07 DIAGNOSIS — D1801 Hemangioma of skin and subcutaneous tissue: Secondary | ICD-10-CM | POA: Diagnosis not present

## 2021-09-07 DIAGNOSIS — L814 Other melanin hyperpigmentation: Secondary | ICD-10-CM | POA: Diagnosis not present

## 2021-09-07 DIAGNOSIS — Z85828 Personal history of other malignant neoplasm of skin: Secondary | ICD-10-CM | POA: Diagnosis not present

## 2021-09-07 DIAGNOSIS — D692 Other nonthrombocytopenic purpura: Secondary | ICD-10-CM | POA: Diagnosis not present

## 2021-09-07 DIAGNOSIS — L720 Epidermal cyst: Secondary | ICD-10-CM | POA: Diagnosis not present

## 2021-09-07 DIAGNOSIS — D485 Neoplasm of uncertain behavior of skin: Secondary | ICD-10-CM | POA: Diagnosis not present

## 2021-09-07 DIAGNOSIS — D225 Melanocytic nevi of trunk: Secondary | ICD-10-CM | POA: Diagnosis not present

## 2021-09-08 DIAGNOSIS — M5416 Radiculopathy, lumbar region: Secondary | ICD-10-CM | POA: Diagnosis not present

## 2021-09-14 ENCOUNTER — Ambulatory Visit (INDEPENDENT_AMBULATORY_CARE_PROVIDER_SITE_OTHER): Payer: Medicare (Managed Care) | Admitting: Pharmacist

## 2021-09-14 DIAGNOSIS — I10 Essential (primary) hypertension: Secondary | ICD-10-CM

## 2021-09-14 DIAGNOSIS — J4520 Mild intermittent asthma, uncomplicated: Secondary | ICD-10-CM

## 2021-09-14 DIAGNOSIS — J4541 Moderate persistent asthma with (acute) exacerbation: Secondary | ICD-10-CM

## 2021-09-14 DIAGNOSIS — E785 Hyperlipidemia, unspecified: Secondary | ICD-10-CM

## 2021-09-14 DIAGNOSIS — R739 Hyperglycemia, unspecified: Secondary | ICD-10-CM

## 2021-09-14 NOTE — Chronic Care Management (AMB) (Signed)
Chronic Care Management Pharmacy Note  09/14/2021 Name:  Kirk Sutton MRN:  [id number] DOB:  11-22-1950  Summary: Hypertension - patient has filled blood pressure medications on time in 2023 so far. He is checking blood pressure a few times a week at home but did not have any numbers available to report. States " blood pressure has been at goal"; Denies dizziness or chest pain. Reviewed adherence to statin - patient has filled on time in 2023 (90 day supply 05/02/2021 and 07/28/2021) and endorses that he is taking atorvastatin every night. Adherence in 2022 was low - around 64% so will continue to monitor and remind patient of importance of statin therapy. Recent hernia surgery 08/28/2021. Surgery and back pain have limited his activity over the last few months. He is interested in increasing walking or PT after cleared to do so. He will see surgeon for recommendation next week.  Reviewed medication list - both Rx and over-the-counter. Answered patient's questions about Omega XL 358m and if safe to take with current medications. OK to take Omega XL 3088m  Discussed avoiding allergy / cold preparations with decongestants as they can affect blood pressure and prostate. Due due having hypertension and BPH - reminded patient to avoid decongestants. (Ok to continue Discussed needed vaccines - patient plans to get Tetanus booster and Shingrix vaccines later in 2023 Plans to get colonoscopy later in 2023 when fully recovered from hernia surgery.  Subjective: Kirk DACOSTAs an [age].o. year old male who is a primary patient of Paz, JoAlda BertholdMD.  The CCM team was consulted for assistance with disease management and care coordination needs.    Engaged with patient by telephone for follow up visit in response to provider referral for pharmacy case management and/or care coordination services.   Consent to Services:  The patient was given information about Chronic Care Management services, agreed to  services, and gave verbal consent prior to initiation of services.  Please see initial visit note for detailed documentation.   Patient Care Team: Kirk BranchMD as PCP - General CoThomasene MohairaVerner Chol MD as Consulting Physician (Urology) DiCalvert CantorMD as Consulting Physician (Ophthalmology) SmOrbie HurstMD as Consulting Physician (Dermatology) MaGardiner BarefootDPM as Consulting Physician (Podiatry) GrRoseanne KaufmanMD as Consulting Physician (Orthopedic Surgery) ReRicard DillonMD (Psychiatry) RaSuella BroadMD as Consulting Physician (Physical Medicine and Rehabilitation) WhTiajuana AmassMD as Referring Physician (Allergy and Immunology) EcCherre RobinsRPH-CPP (Pharmacist)  Recent office visits: 06/19/2021 - Int Med (Kirk Kirk KellsF/U chronic conditions. No med changes noted.  Recent consult visits:  08/02/2021 - Ortho (Kirk Kirk GrahamSeen for back and left shoulder pain due to joint arthritis [facility] No med changes noted. 09/08/2021 - Ortho (Kirk Kirk BushSeen for persistent back pain and spinal stenosis. No med changes - continue hydrocodone APAP/ F/U 3 months.   Hospital visits: None in previous 6 months  Objective:  Lab Results  Component Value Date   CREATININE 1.17 06/19/2021   CREATININE 1.16 12/20/2020   CREATININE 1.18 04/26/2020    Lab Results  Component Value Date   HGBA1C 6.2 12/20/2020   Last diabetic Eye exam:  Lab Results  Component Value Date/Time   HMDIABEYEEXA normal 10/17/2008 12:00 AM    Last diabetic Foot exam:  Lab Results  Component Value Date/Time   HMDIABFOOTEX yes 02/16/2009 12:00 AM        Component Value Date/Time  Sutton 100 12/20/2020 0834   TRIG 73.0 12/20/2020 0834   TRIG 52 02/20/2006 1053   HDL 51.50 12/20/2020 0834   CHOLHDL 2 12/20/2020 0834   VLDL 14.6 12/20/2020 0834   LDLCALC 34 12/20/2020 0834   LDLCALC 83 12/23/2019 0828       Latest Ref Rng & Units 12/20/2020    8:34 AM  02/10/2020    1:10 PM 12/23/2019    8:28 AM  Hepatic Function  Total Protein 6.0 - 8.3 g/dL 6.1   6.1   Albumin 3.5 - 5.2 g/dL 4.1     AST 0 - 37 U/L _0 ALT 0 - 53 U/L _1 Alk Phosphatase 39 - 117 U/L 72     Total Bilirubin 0.2 - 1.2 mg/dL 1.1   0.9     Lab Results  Component Value Date/Time   TSH 1.52 12/20/2020 08:34 AM   TSH 1.25 12/22/2018 01:33 PM       Latest Ref Rng & Units 12/20/2020    8:34 AM 12/23/2019    8:28 AM 12/22/2018    1:33 PM  CBC  WBC 4.0 - 10.5 K/uL 5.7  5.5  8.4   Hemoglobin 13.0 - 17.0 g/dL 12.9  12.9  13.5   Hematocrit 39.0 - 52.0 % 38.4  39.0  40.2   Platelets 150.0 - 400.0 K/uL 138.0  139  151.0     No results found for: "VD25OH"  Clinical ASCVD: No  The ASCVD Risk score (Arnett DK, et al., 2019) failed to calculate for the following reasons:   The valid total cholesterol range is 130 to 320 mg/dL     Social History   Tobacco Use  Smoking Status Never  Smokeless Tobacco Never   BP Readings from Last 3 Encounters:  06/19/21 128/68  04/20/21 132/80  04/10/21 126/68   Pulse Readings from Last 3 Encounters:  06/19/21 90  04/20/21 (!) 57  04/10/21 61   Wt Readings from Last 3 Encounters:  06/19/21 233 lb (105.7 kg)  04/20/21 235 lb 12.8 oz (107 kg)  04/10/21 236 lb 6 oz (107.2 kg)    Assessment: Review of patient past medical history, allergies, medications, health status, including review of consultants reports, laboratory and other test data, was performed as part of comprehensive evaluation and provision of chronic care management services.   SDOH:  (Social Determinants of Health) assessments and interventions performed:  SDOH Interventions    Flowsheet Row Most Recent Value  SDOH Interventions   Housing Interventions Intervention Not Indicated  Physical Activity Interventions Other (Comments)  [Patient just had hernia surgery,  He is planning to restart walking and going to [facility] when cleared by Psychologist, sport and exercise.  Considering physical therapy if rcommended by Kirk Sutton]        CCM Care Plan  Allergies  Allergen Reactions   Cetirizine Hcl     REACTION: prostatitis   Fexofenadine     REACTION: causes prostatitis    Medications Reviewed Today     Reviewed by Cherre Robins, RPH-CPP (Pharmacist) on 09/14/21 at [place] List Status: <None>   Medication Order Taking? Sig Documenting Provider Last Dose Status Informant  acetaminophen (TYLENOL) 650 MG CR tablet [id number] Yes Take 650 mg by mouth every 8 (eight) hours as needed for pain. [provider] Taking Active            Med Note Antony Contras, [place] B   Mon Apr 17, 2021 10:13 AM) Uses twice a day most days.  albuterol (VENTOLIN HFA) 108 (90 Base) MCG/ACT inhaler [id number] Yes INHALE 2 PUFFS INTO THE LUNGS EVERY 6 HOURS AS NEEDED FOR WHEEZING OR SHORTNESS OF BREATH Colon Branch, MD Taking Active   amphetamine-dextroamphetamine (ADDERALL) 10 MG tablet [id number] Yes Take 10 mg by mouth daily with breakfast. [provider] Taking Active            Med Note (CANTER, [facility] D   Tue Dec 20, 2020  7:56 AM)    Ascorbic Acid (VITAMIN C) 1000 MG tablet [id number] Yes Take 1,000 mg by mouth daily. [provider] Taking Active   atorvastatin (LIPITOR) 20 MG tablet [id number] Yes TAKE 1 TABLET(20 MG) BY MOUTH AT BEDTIME Colon Branch, MD Taking Active   azelastine (ASTELIN) 0.1 % nasal spray [id number] Yes Place 1 spray into both nostrils every evening. [provider] Taking Active   benzonatate (TESSALON) 100 MG capsule [id number] Yes TAKE 2 CAPSULES(200 MG) BY MOUTH THREE TIMES DAILY AS NEEDED FOR COUGH Paz, Alda Berthold, MD Taking Active   busPIRone (BUSPAR) 10 MG tablet [id number] Yes Take 10 mg by mouth 2 (two) times daily. [provider] Taking Active   clobetasol ointment (TEMOVATE) 0.05 % [id number]  Apply 1 application topically 2 (two) times daily as needed. For hands [provider]  Active   Coenzyme Q10 100 MG  capsule [id number] Yes Take 100 mg by mouth daily. [provider] Taking Active   desloratadine (CLARINEX) 5 MG tablet [id number] Yes Take 5 mg by mouth daily. [provider] Taking Active   dextromethorphan-guaiFENesin [facility] DM) 30-600 MG 12hr tablet [id number] Yes Take 1 tablet by mouth 2 (two) times daily. [provider] Taking Active   FLUoxetine (PROZAC) 40 MG capsule [id number] Yes Take 1 tablet daily [provider] Taking Active            Med Note (CANTER, Cheri Rous   Tue Apr 12, 2015  2:47 PM)    Fluticasone [facility] [place]) [id number]  Place into the nose daily as needed. [provider]  Active Self  fluticasone-salmeterol (ADVAIR HFA) 967-89 MCG/ACT inhaler [id number] Yes Inhale 2 puffs into the lungs in the morning and at bedtime. [provider] Taking Active   hydrochlorothiazide (HYDRODIURIL) 25 MG tablet [id number] Yes TAKE 1 TABLET(25 MG) BY MOUTH DAILY Colon Branch, MD Taking Active   HYDROcodone-acetaminophen (NORCO/VICODIN) 5-325 MG tablet [id number] Yes Take 1 tablet by mouth 3 (three) times daily as needed. [provider] Taking Active            Med Note De Blanch   Tue Mar 08, 2020  9:18 AM) Per ortho  Magnesium Gluconate 250 MG TABS [id number]  Take 400 mg by mouth daily. [provider]  Active   metoprolol succinate (TOPROL-XL) 50 MG 24 hr tablet [id number] Yes Take 1 tablet (50 mg total) by mouth daily. Colon Branch, MD Taking Active   montelukast (SINGULAIR) 10 MG tablet [id number] Yes TAKE 1 TABLET(10 MG) BY MOUTH AT BEDTIME Colon Branch, MD Taking Active   mupirocin ointment (BACTROBAN) 2 % [id number] Yes Place 1 Application into the nose 2 (two) times daily. For 21 days [provider] Taking Active   OVER THE COUNTER MEDICATION [id number]  Take 1 each by mouth daily as needed. CBD Gummy - for thumb / hand pain [provider]  Active   PAZEO 0.7 % SOLN [id number]  Yes  Place 1 drop into both eyes as needed. [provider] Taking Active            Med Note Ileene Patrick, Vilma Prader C   Wed Feb 23, 2015 10:50 AM) PRN  pyridOXINE (VITAMIN B-6) 100 MG tablet [id number] Yes Take 400 mg by mouth daily. Super B complex [provider] Taking Active Self  terazosin (HYTRIN) 1 MG capsule [id number] Yes TAKE 3 CAPSULES(3 MG) BY MOUTH DAILY Colon Branch, MD Taking Active   Turmeric 500 MG CAPS [id number] Yes Take 1,000 mg by mouth daily. [provider] Taking Active   Vitamin D, Cholecalciferol, 25 MCG (1000 UT) CAPS [id number] Yes Take 1 capsule by mouth daily. [provider] Taking Active             Patient Active Problem List   Diagnosis Date Noted   Incarcerated umbilical hernia 35/00/9381   Cough variant asthma 06/19/2021   Flu-like symptoms 12/23/2020   Chronic back pain 04/30/2018   DJD (degenerative joint disease) 05/22/2017   Snoring 04/03/2016   PCP NOTES >>>>> 02/24/2015   Pain managment 03/08/2014   Anxiety state 07/15/2012   Annual physical exam 07/20/2010   Hyperglycemia 08/11/2008   PROSTATITIS, CHRONIC 03/31/2008   Dyslipidemia 10/13/2007   ALLERGIC RHINITIS 10/17/2006   Essential hypertension 04/09/2006   Extrinsic asthma 04/09/2006    Immunization History  Administered Date(s) Administered   Fluad Quad(high Dose 65+) 12/22/2018, 12/20/2020   H1N1 03/31/2008   Influenza Split 01/22/2011, 01/10/2012   Influenza Whole 01/17/2007, 12/24/2007, 11/17/2009   Influenza, High Dose Seasonal PF 02/23/2015, 01/13/2016, 01/02/2017, 01/16/2018, 03/27/2021   Influenza,inj,Quad PF,6+ Mos 01/20/2013, 03/08/2014, 12/23/2019   PFIZER Comirnaty(Gray Top)Covid-19 Tri-Sucrose Vaccine 05/21/2019, 07/13/2019   PFIZER(Purple Top)SARS-COV-2 Vaccination 05/21/2019, 07/13/2019, 06/01/2020   Pneumococcal Conjugate-13 09/03/2014   Pneumococcal Polysaccharide-23 09/01/2013, 03/27/2021   Td 03/19/2001   Tdap 08/22/2011   Zoster,  Live 08/22/2011    Conditions to be addressed/monitored: HTN, HLD, Anxiety, Depression, and pre diabetes; chronic back pain  Care Plan : General Pharmacy (Adult)  Updates made by Cherre Robins, RPH-CPP since 09/14/2021 12:00 AM     Problem: Medication Adherence (Wellness)   Priority: High  Note:   Current Barriers:  Does not contact provider office for questions/concerns Optimization of medication therapy needed with regards to efficacy and cost  Pharmacist Clinical Goal(s):  Over the next 180 days, patient will adhere to plan to optimize therapeutic regimen for asthma, HTN and anxiety / depression as evidenced by report of adherence to recommended medication management changes through collaboration with PharmD and provider.   Interventions: 1:1 collaboration with Colon Branch, MD regarding development and update of comprehensive plan of care as evidenced by provider attestation and co-signature Inter-disciplinary care team collaboration (see longitudinal plan of care) Comprehensive medication review performed; medication list updated in electronic medical record  Hypertension: Controlled; blood pressure goal <140/90  BP Readings from Last 3 Encounters:  06/19/21 128/68  04/20/21 132/80  04/10/21 126/68  Current treatment:  Metoprolol succinate 62m daily Hydrochlorothiazide 244mdaily Current home readings: patient did not report specific readings but endorses that home blood pressure has been at goal.  Denies hypotensive/hypertensive symptoms Interventions:  Counseled on blood pressure  goal of <130/80;  Recommended check blood pressure  2 to 3 times per week and record   Continue current blood pressure medications.   Hyperlipidemia: Controlled; LDL goal <100 current treatment:  Atorvastatin 2072maily Co Enzyme Q10 - 100m6mily   Medications previously  tried: none  Current dietary patterns: Patient reports he is trying to limit portion sizes; he has decreased how  often he is eating out; Has increased intake of green leafy vegetable.  Exercise: used to go to [facility] but has not been going consistently due to recent surgery and work travel.   Interventions:  Counseled on ASCVD risk and reason for taking atorvastatin ;  Continue atorvastatin and CoEnzyme Q10 152m daily  Patient asked about taking over-the-counter Omega XL. Reviewed product. Discussed ingredients in Omega XL - Ok to start 1 capsule daily Reminded patient that exercise goal is at least 150 minutes per week   Asthma / Allergies: Improving;  Monitored by Pulmonary - MJean RosenthalReports he uses albuterol 1 or 2 times per week.  Using Maintenance inhaler every day Current Therapy:  Advair HFA inhaler 115/231m - inhaler 2 puffs into lungs twice daily (maintenance inhaler)  Albuterol inhaler - inhaler 2 puffs into lung up to every 6 hours as needed for wheezing or shortness of breath Astelin nasal spray - use 1 spray in each nostril once a day in the evening Flonase nasal spray - use 1 spray in each nostril as needed for allergies Desloratadine 62m70m take 1 tablet daily benzonatate 100m34m to 3 times a day if needed for cough Montelukast 10mg41mly each evening. Interventions:   Reviewed above medications and discussed dosing Continue to follow up with pulmonology Reminded to avoid allergy / cold preparations with decongestants as they can affect blood pressure and prostate.  Depression/Anxiety  Controlled;  current treatment:  Fluoxetine 40mg 77my buspirone 10mg t50m a day amphetamine/dextroamphetamine 10mg da51m Connected with Kirk Reddy foReece Levytal health support/ needs Interventions:  Recommended continue current therapy and continue to f/u with Kirk Reddy , [name]-Diabetes:        Currently controlled with no pharmacotherapy       Diet: patient is trying to limit serving sizes. He has set a weight goal of 215lbs, he currently is at 230lbs.        Exercise: none currently due to  recent surgery Interventions:       Recommendations: continue to limit serving sizes and sugar intake Increase physical activity to 150 minutes per week as able  Chronic Back Pain: Not controlled Monitored by Kirk Sutton ReNelva Bushd 3 injections to back with a little help.  Current therapy: acetaminophen 500mg up 80m tabs per day as needed;  Hydrocodone/APAP 10/3262mg up t762mery 6 hours as needed (rarely takes) Tumeric 500mg daily17mterventions:  Reminded patient to limit amount of acetaminophen to 3000mg per da90meminded him that Hydrocodone/APAP 5/3262mg contain16m62mg of aceta90mphen  Continue to follow up with Kirk Sutton  Health Kirk Bushenance:  Discussed colonoscopy - ok to postpone until full recovery from recent hernia surgery. Patient is planning to get Shingrix and updated Tetanus booster in 2023.   Patient Goals/Self-Care Activities take medications as prescribed,  target a minimum of 150 minutes of moderate intensity exercise weekly - once cleared by surgeon. Engage in dietary modifications by limiting serving sizes and intake of sugar (Great job increasing vegetable and limiting fast food / eating out) Get Shingrix and Tetanus booster vaccinations Plan to get colonoscopy later in 2023 when fully recovered from hernia surgery. Ok to start Omega XL 300mg daily  Av44mallergy / cold preparations with decongestants as they can affect blood pressure and prostate.  Follow Up Plan: Telephone follow up appointment with care management team member scheduled  for:  3 to 6 months      Long-Range Goal: Medication Adherence Maintained   This Visit's Progress: On track  Recent Progress: On track  Note:   Reviewed all medications to determine if patient or caregiver knows why the medications are given and if taken as prescribed.  Completed a medication adherence assessment including barriers to medication adherence.  Assessed barriers to medication adherence.  Assessed presence of side  effects; provide suggestions to manage or reduce side effects.       Medication Assistance: None required.  Patient affirms current coverage meets needs.  Patient's preferred pharmacy is:  [id number] [address], [address] AT [facility] [address] [place] [address] Phone: [phone] Fax: [phone]  [facility]) [place], [place] [address] Phone: [phone] Fax: [phone]   Follow Up:  Patient agrees to Care Plan and Follow-up.  Plan: Telephone follow up appointment with care management team member scheduled for:  4 to 5 months  Cherre Robins, PharmD Clinical Pharmacist [facility]

## 2021-09-14 NOTE — Patient Instructions (Signed)
Mr. Raimondo It was a pleasure speaking with you  Below is a summary of your health goals and  at the end of this summary is an updated care plan  Patient Goals/Self-Care Activities Take medications as prescribed,  Target a minimum of 150 minutes of moderate intensity exercise weekly - once cleared by surgeon. Engage in dietary modifications by limiting serving sizes and intake of sugar (Great job increasing vegetable and limiting fast food / eating out) Get Shingrix and Tetanus booster vaccinations Plan to get colonoscopy later in 2023 when fully recovered from hernia surgery. Ok to start Omega XL '300mg'$  daily  Avoid allergy / cold preparations with decongestants as they can affect blood pressure and prostate.   If you have any questions or concerns, please feel free to contact me either at the phone number below or with a MyChart message.   Keep up the good work!  Cherre Robins, PharmD Clinical Pharmacist [place] [facility] [place] [facility] [phone] (direct line)  [phone] (main office number)   The patient verbalized understanding of instructions, educational materials, and care plan provided today and agreed to receive a mailed copy of patient instructions, educational materials, and care plan.

## 2021-09-15 DIAGNOSIS — J45909 Unspecified asthma, uncomplicated: Secondary | ICD-10-CM | POA: Diagnosis not present

## 2021-09-15 DIAGNOSIS — Z09 Encounter for follow-up examination after completed treatment for conditions other than malignant neoplasm: Secondary | ICD-10-CM | POA: Diagnosis not present

## 2021-09-15 DIAGNOSIS — E785 Hyperlipidemia, unspecified: Secondary | ICD-10-CM | POA: Diagnosis not present

## 2021-09-15 DIAGNOSIS — E669 Obesity, unspecified: Secondary | ICD-10-CM | POA: Diagnosis not present

## 2021-09-15 DIAGNOSIS — I10 Essential (primary) hypertension: Secondary | ICD-10-CM

## 2021-10-02 DIAGNOSIS — I10 Essential (primary) hypertension: Secondary | ICD-10-CM | POA: Diagnosis not present

## 2021-10-02 DIAGNOSIS — R42 Dizziness and giddiness: Secondary | ICD-10-CM | POA: Diagnosis not present

## 2021-10-02 DIAGNOSIS — D649 Anemia, unspecified: Secondary | ICD-10-CM | POA: Diagnosis not present

## 2021-10-02 DIAGNOSIS — R110 Nausea: Secondary | ICD-10-CM | POA: Diagnosis not present

## 2021-10-02 DIAGNOSIS — M549 Dorsalgia, unspecified: Secondary | ICD-10-CM | POA: Diagnosis not present

## 2021-10-02 DIAGNOSIS — R61 Generalized hyperhidrosis: Secondary | ICD-10-CM | POA: Diagnosis not present

## 2021-10-02 DIAGNOSIS — D696 Thrombocytopenia, unspecified: Secondary | ICD-10-CM | POA: Diagnosis not present

## 2021-10-02 DIAGNOSIS — I517 Cardiomegaly: Secondary | ICD-10-CM | POA: Diagnosis not present

## 2021-10-02 DIAGNOSIS — R231 Pallor: Secondary | ICD-10-CM | POA: Diagnosis not present

## 2021-10-02 DIAGNOSIS — R9431 Abnormal electrocardiogram [ECG] [EKG]: Secondary | ICD-10-CM | POA: Diagnosis not present

## 2021-10-02 DIAGNOSIS — R001 Bradycardia, unspecified: Secondary | ICD-10-CM | POA: Diagnosis not present

## 2021-10-03 ENCOUNTER — Encounter: Payer: Self-pay | Admitting: Internal Medicine

## 2021-10-03 ENCOUNTER — Ambulatory Visit (INDEPENDENT_AMBULATORY_CARE_PROVIDER_SITE_OTHER): Payer: Medicare (Managed Care) | Admitting: Internal Medicine

## 2021-10-03 VITALS — BP 132/82 | HR 53 | Temp 98.3°F | Resp 18 | Ht 71.0 in | Wt 234.1 lb

## 2021-10-03 DIAGNOSIS — I208 Other forms of angina pectoris: Secondary | ICD-10-CM | POA: Diagnosis not present

## 2021-10-03 DIAGNOSIS — R001 Bradycardia, unspecified: Secondary | ICD-10-CM

## 2021-10-03 DIAGNOSIS — I2089 Other forms of angina pectoris: Secondary | ICD-10-CM

## 2021-10-03 DIAGNOSIS — R61 Generalized hyperhidrosis: Secondary | ICD-10-CM | POA: Diagnosis not present

## 2021-10-03 NOTE — Patient Instructions (Signed)
Continue same medications  Check the  blood pressure regularly BP GOAL is between 110/65 and  135/85. If it is consistently higher or lower, let me know  If possible, check your heart rate.  It should be in the mid 62s and above.  If you have more symptoms seek medical attention.  We will schedule a Lexiscan stress test.

## 2021-10-03 NOTE — Assessment & Plan Note (Signed)
Acute episode of cold sweats. Sxs as described above, work-up at the ER was essentially unremarkable. This is happening in the context of some stress, taking his routine morning CBD gummy but also hydrocodone. Patient thinks this could have been a panic attack. EKG today: Sinus bradycardia, heart rate 59, no acute changes, not much different compared to previous EKG. DDx includes angina equivalent.  He certainly has risk factors. Plan:  Lexiscan. Good hydration, call if symptoms resurface. HTN: Well-controlled, slightly bradycardic, ER recommended to reduce metoprolol dose but as long as heart rate is in the 50s recommend to continue with present dose.  He verbalized understanding

## 2021-10-03 NOTE — Progress Notes (Signed)
Subjective:    Patient ID: Kirk Sutton, male    DOB: 08-10-1950, 71 y.o.   MRN: [id number]  DOS:  10/03/2021 Type of visit - description: ER follow-up  Went to the ER in [facility] yesterday:  He woke up feeling fine except for back pain, took a hydrocodone.  He also took a CBD gummy as he usually does. He went to his office, he admits to some stress, he had to give a presentation to the [place]. Developed cold sweats, drank some water, went to the [place] office and the cold sweats got much worse. At the time he had no chest pain or cough, no palpitations.  No LOC.  No difficulty breathing. EMS was called, when they arrived, he started with nausea and vomited x3.  CBG at the time was 145. He was not exposed to the elements or outdoors so my suspicion for dehydration is very low. Overall pt himself suspects he  had a panic attack.   At the ER: Vital signs stable. EKG: Sinus bradycardia.  Was recommended to decrease metoprolol.  Work-up: Troponin negative, hemoglobin 12.6, platelets 124 ( similar to previous CBCs), potassium 3.8, blood sugar 149, LFTs normal, creatinine 1.2. Chest x-ray cardiomegaly with no other abnormalities Was recommended admission for stress testing and observation, patient declined. No further episodes since then   Review of Systems See above   Past Medical History:  Diagnosis Date   Allergic rhinitis    Asthma    Chronic prostatitis    sees urology   Diabetes mellitus    type 11 (a1c 6.0 03/2008)/no meds.   Hernia, umbilical    Hyperlipidemia    Hypertension    Psoriasis    @ hands, sees derm    Past Surgical History:  Procedure Laterality Date   APPENDECTOMY     FERTILITY SURGERY  03/19/1977   Spermatic Vein Ligation   HERNIA REPAIR     1950s   NASAL SEPTUM SURGERY  03/20/2007   SHOULDER SURGERY  03/19/2006   left   TESTICLE SURGERY     undescended repair    UMBILICAL HERNIA REPAIR  08/28/2021   VASECTOMY      Current Outpatient  Medications  Medication Instructions   acetaminophen (TYLENOL) 650 mg, Oral, Every 8 hours PRN   albuterol (VENTOLIN HFA) 108 (90 Base) MCG/ACT inhaler INHALE 2 PUFFS INTO THE LUNGS EVERY 6 HOURS AS NEEDED FOR WHEEZING OR SHORTNESS OF BREATH   amphetamine-dextroamphetamine (ADDERALL) 10 MG tablet 10 mg, Oral, Daily with breakfast   atorvastatin (LIPITOR) 20 MG tablet TAKE 1 TABLET(20 MG) BY MOUTH AT BEDTIME   azelastine (ASTELIN) 0.1 % nasal spray 1 spray, Each Nare, Every evening   benzonatate (TESSALON) 100 MG capsule TAKE 2 CAPSULES(200 MG) BY MOUTH THREE TIMES DAILY AS NEEDED FOR COUGH   busPIRone (BUSPAR) 10 mg, Oral, 2 times daily   clobetasol ointment (TEMOVATE) 4.74 % 1 application , Topical, 2 times daily PRN, For hands    Coenzyme Q10 100 mg, Oral, Daily   desloratadine (CLARINEX) 10 mg, Oral, Daily   dextromethorphan-guaiFENesin (MUCINEX DM) 30-600 MG 12hr tablet 1 tablet, Oral, 2 times daily   FLUoxetine (PROZAC) 40 MG capsule Take 1 tablet daily   Fluticasone Furoate (FLONASE SENSIMIST NA) Nasal, Daily PRN   fluticasone-salmeterol (ADVAIR HFA) 115-21 MCG/ACT inhaler 2 puffs, Inhalation, 2 times daily   hydrochlorothiazide (HYDRODIURIL) 25 MG tablet TAKE 1 TABLET(25 MG) BY MOUTH DAILY   HYDROcodone-acetaminophen (NORCO/VICODIN) 5-325 MG tablet 1 tablet,  Oral, Daily PRN   Magnesium Gluconate 400 mg, Oral, Daily   metoprolol succinate (TOPROL-XL) 50 mg, Oral, Daily   montelukast (SINGULAIR) 10 MG tablet TAKE 1 TABLET(10 MG) BY MOUTH AT BEDTIME   mupirocin ointment (BACTROBAN) 2 % 1 Application, 2 times daily   OVER THE COUNTER MEDICATION 1 each, Oral, Daily PRN, CBD Gummy - for thumb / hand pain   PAZEO 0.7 % SOLN 1 drop, Both Eyes, As needed   pyridOXINE (VITAMIN B-6) 400 mg, Oral, Daily, Super B complex   terazosin (HYTRIN) 1 MG capsule TAKE 3 CAPSULES(3 MG) BY MOUTH DAILY   Turmeric 26 mg, Oral, Daily   vitamin C 1,000 mg, Oral, Daily   Vitamin D, Cholecalciferol, 25 MCG  (1000 UT) CAPS 1 capsule, Oral, Daily       Objective:   Physical Exam BP 132/82   Pulse (!) 53   Temp 98.3 F (36.8 C) (Oral)   Resp 18   Ht '5\' 11"'$  (1.803 m)   Wt 234 lb 2 oz (106.2 kg)   SpO2 94%   BMI 32.65 kg/m  General:   Well developed, NAD, BMI noted. HEENT:  Normocephalic . Face symmetric, atraumatic Lungs:  CTA B Normal respiratory effort, no intercostal retractions, no accessory muscle use. Heart: RRR,  no murmur. Abdomen: Exam limited, he is using large hernia brace. Lower extremities: no pretibial edema bilaterally  Skin: Not pale. Not jaundice Neurologic:  alert & oriented X3.  Speech normal, gait appropriate for age and unassisted Psych--  Cognition and judgment appear intact.  Cooperative with normal attention span and concentration.  Behavior appropriate. No anxious or depressed appearing.      Assessment      Assessment Prediabetes  HTN Hyperlipidemia Anxiety Dr Fay Records (psych)   Cough variant asthma, atopic dermatitis, allergic rhinitis and allergic conjunctivitis.Dr Ova Freshwater MSK:  --Chronic back pain, pain medication as needed, used to see Dr Nelva Bush, did PT @ [facility] [phone], better --DJD- hands Chronic prostatitis, sees urology Psoriasis, hands, sees dermatology Snoring (severe per pt)- dentist Rx a moth guard ~ 08-2015, helps  PLAN Acute episode of cold sweats. Sxs as described above, work-up at the ER was essentially unremarkable. This is happening in the context of some stress, taking his routine morning CBD gummy but also hydrocodone. Patient thinks this could have been a panic attack. EKG today: Sinus bradycardia, heart rate 59, no acute changes, not much different compared to previous EKG. DDx includes angina equivalent.  He certainly has risk factors. Plan:  Lexiscan. Good hydration, call if symptoms resurface. HTN: Well-controlled, slightly bradycardic, ER recommended to reduce metoprolol dose but as long as heart rate is in the  50s recommend to continue with present dose.  He verbalized understanding

## 2021-10-06 DIAGNOSIS — Z09 Encounter for follow-up examination after completed treatment for conditions other than malignant neoplasm: Secondary | ICD-10-CM | POA: Diagnosis not present

## 2021-10-06 DIAGNOSIS — K420 Umbilical hernia with obstruction, without gangrene: Secondary | ICD-10-CM | POA: Diagnosis not present

## 2021-10-06 DIAGNOSIS — E669 Obesity, unspecified: Secondary | ICD-10-CM | POA: Diagnosis not present

## 2021-10-10 ENCOUNTER — Encounter (HOSPITAL_COMMUNITY): Payer: Self-pay | Admitting: *Deleted

## 2021-10-11 ENCOUNTER — Ambulatory Visit (HOSPITAL_COMMUNITY): Payer: Medicare (Managed Care) | Attending: Internal Medicine

## 2021-10-11 DIAGNOSIS — I208 Other forms of angina pectoris: Secondary | ICD-10-CM | POA: Diagnosis not present

## 2021-10-11 DIAGNOSIS — R61 Generalized hyperhidrosis: Secondary | ICD-10-CM

## 2021-10-11 DIAGNOSIS — R001 Bradycardia, unspecified: Secondary | ICD-10-CM | POA: Diagnosis not present

## 2021-10-11 LAB — MYOCARDIAL PERFUSION IMAGING
LV dias vol: 103 mL (ref 62–150)
LV sys vol: 44 mL
Nuc Stress EF: 57 %
Peak HR: 62 {beats}/min
Rest HR: 51 {beats}/min
Rest Nuclear Isotope Dose: 10.6 mCi
SDS: 3
SRS: 0
SSS: 3
ST Depression (mm): 0 mm
Stress Nuclear Isotope Dose: 32.4 mCi
TID: 1.08

## 2021-10-11 MED ORDER — TECHNETIUM TC 99M TETROFOSMIN IV KIT
10.6000 | PACK | Freq: Once | INTRAVENOUS | Status: AC | PRN
  Administered 2021-10-11: 10.6 via INTRAVENOUS

## 2021-10-11 MED ORDER — TECHNETIUM TC 99M TETROFOSMIN IV KIT
32.4000 | PACK | Freq: Once | INTRAVENOUS | Status: AC | PRN
  Administered 2021-10-11: 32.4 via INTRAVENOUS

## 2021-10-11 MED ORDER — REGADENOSON 0.4 MG/5ML IV SOLN
0.4000 mg | Freq: Once | INTRAVENOUS | Status: AC
  Administered 2021-10-11: 0.4 mg via INTRAVENOUS

## 2021-10-22 ENCOUNTER — Other Ambulatory Visit: Payer: Self-pay | Admitting: Internal Medicine

## 2021-11-10 ENCOUNTER — Encounter: Payer: Self-pay | Admitting: Internal Medicine

## 2021-12-07 DIAGNOSIS — M5136 Other intervertebral disc degeneration, lumbar region: Secondary | ICD-10-CM | POA: Diagnosis not present

## 2021-12-07 DIAGNOSIS — M5416 Radiculopathy, lumbar region: Secondary | ICD-10-CM | POA: Diagnosis not present

## 2021-12-16 DIAGNOSIS — Z0000 Encounter for general adult medical examination without abnormal findings: Secondary | ICD-10-CM | POA: Diagnosis not present

## 2021-12-22 ENCOUNTER — Encounter: Payer: Medicare (Managed Care) | Admitting: Internal Medicine

## 2022-01-03 ENCOUNTER — Other Ambulatory Visit: Payer: Self-pay | Admitting: Internal Medicine

## 2022-01-10 ENCOUNTER — Ambulatory Visit (INDEPENDENT_AMBULATORY_CARE_PROVIDER_SITE_OTHER): Payer: Medicare (Managed Care) | Admitting: Internal Medicine

## 2022-01-10 ENCOUNTER — Encounter: Payer: Self-pay | Admitting: Internal Medicine

## 2022-01-10 VITALS — BP 126/64 | HR 57 | Temp 98.2°F | Resp 18 | Ht 71.0 in | Wt 236.2 lb

## 2022-01-10 DIAGNOSIS — I10 Essential (primary) hypertension: Secondary | ICD-10-CM | POA: Diagnosis not present

## 2022-01-10 DIAGNOSIS — Z0000 Encounter for general adult medical examination without abnormal findings: Secondary | ICD-10-CM

## 2022-01-10 DIAGNOSIS — Z23 Encounter for immunization: Secondary | ICD-10-CM | POA: Diagnosis not present

## 2022-01-10 DIAGNOSIS — E785 Hyperlipidemia, unspecified: Secondary | ICD-10-CM | POA: Diagnosis not present

## 2022-01-10 DIAGNOSIS — R739 Hyperglycemia, unspecified: Secondary | ICD-10-CM | POA: Diagnosis not present

## 2022-01-10 NOTE — Patient Instructions (Addendum)
Vaccines I recommend:  Tdap (tetanus) Shingrix (shingles) Covid booster RSV  Check the  blood pressure regularly BP GOAL is between 110/65 and  135/85. If it is consistently higher or lower, let me know    GO TO THE LAB : Get the blood work     [place], [place] back for checkup in 6 months     Advanced care planning:  Do you have a "Living will", "[place] of attorney" ?   If you already have a living will or healthcare power of attorney, is recommended you bring the copy to be scanned in your chart. The document will be available to all the doctors you see in the system.  If you don't have one, please consider create one.   More information at:  [email or web address]

## 2022-01-10 NOTE — Progress Notes (Unsigned)
Subjective:    Patient ID: Kirk Sutton, male    DOB: 1950/03/21, 71 y.o.   MRN: [id number]  DOS:  01/10/2022 Type of visit - description: CPX  Here for CPX. Had 2 episodes of pain at the left foot, dorsum.  No injury, no swelling.  Pain was short-lived but intense.  Currently asymptomatic  Review of Systems See above   Past Medical History:  Diagnosis Date   Allergic rhinitis    Asthma    Chronic prostatitis    sees urology   Diabetes mellitus    type 11 (a1c 6.0 03/2008)/no meds.   Hernia, umbilical    Hyperlipidemia    Hypertension    Psoriasis    @ hands, sees derm    Past Surgical History:  Procedure Laterality Date   APPENDECTOMY     FERTILITY SURGERY  03/19/1977   Spermatic Vein Ligation   HERNIA REPAIR     1950s   NASAL SEPTUM SURGERY  03/20/2007   SHOULDER SURGERY  03/19/2006   left   TESTICLE SURGERY     undescended repair    UMBILICAL HERNIA REPAIR  08/28/2021   VASECTOMY      Current Outpatient Medications  Medication Instructions   acetaminophen (TYLENOL) 650 mg, Oral, Every 8 hours PRN   albuterol (VENTOLIN HFA) 108 (90 Base) MCG/ACT inhaler INHALE 2 PUFFS INTO THE LUNGS EVERY 6 HOURS AS NEEDED FOR WHEEZING OR SHORTNESS OF BREATH   amphetamine-dextroamphetamine (ADDERALL) 10 MG tablet 10 mg, Oral, Daily with breakfast   atorvastatin (LIPITOR) 20 mg, Oral, Daily at bedtime   azelastine (ASTELIN) 0.1 % nasal spray 1 spray, Each Nare, Every evening   benzonatate (TESSALON) 100 MG capsule TAKE 2 CAPSULES(200 MG) BY MOUTH THREE TIMES DAILY AS NEEDED FOR COUGH   busPIRone (BUSPAR) 10 mg, Oral, 2 times daily   clobetasol ointment (TEMOVATE) 5.10 % 1 application , Topical, 2 times daily PRN, For hands    Coenzyme Q10 100 mg, Oral, Daily   desloratadine (CLARINEX) 10 mg, Oral, Daily   dextromethorphan-guaiFENesin (MUCINEX DM) 30-600 MG 12hr tablet 1 tablet, Oral, 2 times daily   FLUoxetine (PROZAC) 40 MG capsule Take 1 tablet daily   Fluticasone  Furoate (FLONASE SENSIMIST NA) Nasal, Daily PRN   fluticasone-salmeterol (ADVAIR HFA) 115-21 MCG/ACT inhaler 2 puffs, Inhalation, 2 times daily   hydrochlorothiazide (HYDRODIURIL) 25 MG tablet TAKE 1 TABLET(25 MG) BY MOUTH DAILY   HYDROcodone-acetaminophen (NORCO/VICODIN) 5-325 MG tablet 1 tablet, Daily PRN   Magnesium Gluconate 400 mg, Oral, Daily   metoprolol succinate (TOPROL-XL) 50 mg, Oral, Daily   montelukast (SINGULAIR) 10 MG tablet TAKE 1 TABLET(10 MG) BY MOUTH AT BEDTIME   mupirocin ointment (BACTROBAN) 2 % 1 Application, 2 times daily   OVER THE COUNTER MEDICATION 1 each, Oral, Daily PRN, CBD Gummy - for thumb / hand pain   PAZEO 0.7 % SOLN 1 drop, Both Eyes, As needed   pyridOXINE (VITAMIN B6) 400 mg, Oral, Daily, Super B complex   terazosin (HYTRIN) 3 mg, Oral, Daily   Turmeric 1,000 mg, Oral, Daily   vitamin C 1,000 mg, Oral, Daily   Vitamin D, Cholecalciferol, 25 MCG (1000 UT) CAPS 1 capsule, Oral, Daily       Objective:   Physical Exam BP 126/64   Pulse (!) 57   Temp 98.2 F (36.8 C) (Oral)   Resp 18   Ht '5\' 11"'$  (1.803 m)   Wt 236 lb 4 oz (107.2 kg)   SpO2 94%  BMI 32.95 kg/m  General:   Well developed, NAD, BMI noted.  HEENT:  Normocephalic . Face symmetric, atraumatic Lungs:  CTA B Normal respiratory effort, no intercostal retractions, no accessory muscle use. Heart: RRR,  no murmur.  Abdomen:  Not distended, soft, non-tender. No rebound or rigidity.  Well healed surgical scar from recent hernia repair. Skin: Not pale. Not jaundice Lower extremities: no pretibial edema bilaterally.  L foot: Normal to inspection and palpation.  Good pedal pulses neurologic:  alert & oriented X3.  Speech normal, gait appropriate for age and unassisted Psych--  Cognition and judgment appear intact.  Cooperative with normal attention span and concentration.  Behavior appropriate. No anxious or depressed appearing.     Assessment    Assessment Prediabetes   HTN Hyperlipidemia Anxiety Dr Fay Records (psych)   Cough variant asthma, atopic dermatitis, allergic rhinitis and allergic conjunctivitis.Dr Ova Freshwater MSK:  --Chronic back pain, pain medication as needed, used to see Dr Nelva Bush, did PT @ [facility] [phone], better --DJD- hands Chronic prostatitis, sees urology Psoriasis, hands, sees dermatology Snoring (severe per pt)- dentist Rx a moth guard ~ 08-2015, helps Negative a stress test 09-2021  PLAN Here for CPX Prediabetes: Check A1c HTN: BP very good today, recommend to monitor at home, continue HCTZ, metoprolol, terazosin.  Checking labs Hyperlipidemia: On atorvastatin.  Checking labs Episodes of cold sweats:See last visit,  stress test was ordered, low risk. Cough variant asthma: Continue inhalers and Tessalon Perles, RF as needed. Snoring: At this point reports snoring is minimal, energy level is good, not falling asleep easily.  Foot pain: See HPI, observation.  Exam is normal. RTC 6 months  -Td 2013 -  PNM 23: 2015 and 2023; prevnar:  2016  - Zostavax 2013 - shingrex rec  -COVID booster discussed. - RSV discussed -Flu shot today - CCS: Colonoscopy Dr Santogade 2005 (-),   Cscope 02/2017, due for a colonoscopy, plans to proceed, encouraged to call me if I can help set that up. -Sees urology yearly    -Diet-exercise discussed. -Labs: CMP, FLP, CBC, A1c - Healthcare POA discussed  ================= Acute episode of cold sweats. Sxs as described above, work-up at the ER was essentially unremarkable. This is happening in the context of some stress, taking his routine morning CBD gummy but also hydrocodone. Patient thinks this could have been a panic attack. EKG today: Sinus bradycardia, heart rate 59, no acute changes, not much different compared to previous EKG. DDx includes angina equivalent.  He certainly has risk factors. Plan:  Lexiscan. Good hydration, call if symptoms resurface. HTN: Well-controlled, slightly bradycardic, ER  recommended to reduce metoprolol dose but as long as heart rate is in the 50s recommend to continue with present dose.  He verbalized understanding

## 2022-01-11 ENCOUNTER — Encounter: Payer: Self-pay | Admitting: Internal Medicine

## 2022-01-11 LAB — COMPREHENSIVE METABOLIC PANEL
ALT: 18 U/L (ref 0–53)
AST: 21 U/L (ref 0–37)
Albumin: 4.1 g/dL (ref 3.5–5.2)
Alkaline Phosphatase: 72 U/L (ref 39–117)
BUN: 19 mg/dL (ref 6–23)
CO2: 29 mEq/L (ref 19–32)
Calcium: 8.7 mg/dL (ref 8.4–10.5)
Chloride: 97 mEq/L (ref 96–112)
Creatinine, Ser: 1.18 mg/dL (ref 0.40–1.50)
GFR: 61.1 mL/min (ref >60.00)
Glucose, Bld: 97 mg/dL (ref 70–99)
Potassium: 3.7 mEq/L (ref 3.5–5.1)
Sodium: 134 mEq/L — ABNORMAL LOW (ref 135–145)
Total Bilirubin: 1.4 mg/dL — ABNORMAL HIGH (ref 0.2–1.2)
Total Protein: 6 g/dL (ref 6.0–8.3)

## 2022-01-11 LAB — LIPID PANEL
Cholesterol: 87 mg/dL (ref 0–200)
HDL: 40.8 mg/dL (ref >39.00)
LDL Cholesterol: 29 mg/dL (ref 0–99)
NonHDL: 45.83
Total CHOL/HDL Ratio: 2
Triglycerides: 83 mg/dL (ref 0.0–149.0)
VLDL: 16.6 mg/dL (ref 0.0–40.0)

## 2022-01-11 LAB — CBC WITH DIFFERENTIAL/PLATELET
Basophils Absolute: 0.1 10*3/uL (ref 0.0–0.1)
Basophils Relative: 1.3 % (ref 0.0–3.0)
Eosinophils Absolute: 0.1 10*3/uL (ref 0.0–0.7)
Eosinophils Relative: 1.8 % (ref 0.0–5.0)
HCT: 36.6 % — ABNORMAL LOW (ref 39.0–52.0)
Hemoglobin: 12.4 g/dL — ABNORMAL LOW (ref 13.0–17.0)
Lymphocytes Relative: 25.1 % (ref 12.0–46.0)
Lymphs Abs: 1.9 10*3/uL (ref 0.7–4.0)
MCHC: 33.8 g/dL (ref 30.0–36.0)
MCV: 97 fl (ref 78.0–100.0)
Monocytes Absolute: 0.6 10*3/uL (ref 0.1–1.0)
Monocytes Relative: 8.6 % (ref 3.0–12.0)
Neutro Abs: 4.6 10*3/uL (ref 1.4–7.7)
Neutrophils Relative %: 63.2 % (ref 43.0–77.0)
Platelets: 151 10*3/uL (ref 150.0–400.0)
RBC: 3.77 Mil/uL — ABNORMAL LOW (ref 4.22–5.81)
RDW: 13.4 % (ref 11.5–15.5)
WBC: 7.4 10*3/uL (ref 4.0–10.5)

## 2022-01-11 LAB — HEMOGLOBIN A1C: Hgb A1c MFr Bld: 6.1 % (ref 4.6–6.5)

## 2022-01-11 NOTE — Assessment & Plan Note (Signed)
Here for CPX Prediabetes: Check A1c HTN: BP very good today, recommend to monitor at home, continue HCTZ, metoprolol, terazosin.  Checking labs Hyperlipidemia: On atorvastatin.  Checking labs Episodes of cold sweats:See last visit,  stress test was ordered, low risk. No further sxs Cough variant asthma: Continue inhalers and Tessalon Perles, RF as needed. Snoring: At this point reports snoring is minimal, energy level is good, not falling asleep easily.  Foot pain: See HPI, observation.  Exam is normal. RTC 6 months

## 2022-01-11 NOTE — Assessment & Plan Note (Signed)
-  Td 2013 -  PNM 23: 2015 and 2023; prevnar:  2016  - Zostavax 2013 - shingrex rec  -COVID booster discussed. - RSV discussed -Flu shot today - CCS: Colonoscopy Dr Santogade 2005 (-),   Cscope 02/2017, due for a colonoscopy, plans to proceed, encouraged to call me if I can help set that up. -Sees urology yearly    -Diet-exercise discussed. -Labs: CMP, FLP, CBC, A1c - Healthcare POA discussed

## 2022-01-12 ENCOUNTER — Other Ambulatory Visit (INDEPENDENT_AMBULATORY_CARE_PROVIDER_SITE_OTHER): Payer: Medicare (Managed Care)

## 2022-01-12 ENCOUNTER — Other Ambulatory Visit: Payer: Self-pay

## 2022-01-12 DIAGNOSIS — D649 Anemia, unspecified: Secondary | ICD-10-CM

## 2022-01-12 LAB — IRON: Iron: 81 ug/dL (ref 42–165)

## 2022-01-12 LAB — FERRITIN: Ferritin: 146.6 ng/mL (ref 22.0–322.0)

## 2022-01-18 ENCOUNTER — Encounter: Payer: Self-pay | Admitting: Internal Medicine

## 2022-02-14 ENCOUNTER — Telehealth: Payer: Medicare (Managed Care)

## 2022-02-15 DIAGNOSIS — F331 Major depressive disorder, recurrent, moderate: Secondary | ICD-10-CM | POA: Diagnosis not present

## 2022-02-15 DIAGNOSIS — F900 Attention-deficit hyperactivity disorder, predominantly inattentive type: Secondary | ICD-10-CM | POA: Diagnosis not present

## 2022-02-19 ENCOUNTER — Telehealth: Payer: Medicare (Managed Care)

## 2022-02-22 ENCOUNTER — Ambulatory Visit: Payer: Medicare (Managed Care) | Admitting: Pharmacist

## 2022-02-22 DIAGNOSIS — R001 Bradycardia, unspecified: Secondary | ICD-10-CM

## 2022-02-22 DIAGNOSIS — R6889 Other general symptoms and signs: Secondary | ICD-10-CM

## 2022-02-22 DIAGNOSIS — I10 Essential (primary) hypertension: Secondary | ICD-10-CM

## 2022-02-22 DIAGNOSIS — Z9889 Other specified postprocedural states: Secondary | ICD-10-CM

## 2022-02-22 MED ORDER — BENZONATATE 100 MG PO CAPS: ORAL_CAPSULE | ORAL | 0 refills | Status: DC

## 2022-02-22 NOTE — Patient Instructions (Signed)
Mr. Kirk Sutton It was a pleasure speaking with you today.  Below is a summary of your health goals and summary of our recent visit.   Continue current dose of metoprolol ER '50mg'$  daily. Continue to check heart rate and oxygen level 2 to 3 times per week or ifyou have symptoms of low heart rate Fatigue or feeling weak. Dizziness or lightheadedness. Confusion. Fainting (or near-fainting) Shortness of breath. Tires easily during exercise. Chest pain.  Updated prescription for benzonatate was sent to your pharmacy  Remember to to get Tetanus booster and Shingrix series. Sent order for colonoscopy - you can follow up with their office to schedule. [facility] - Dr Havery Moros [phone]   As always if you have any questions or concerns especially regarding medications, please feel free to contact me either at the phone number below or with a MyChart message.   Keep up the good work!  Cherre Robins, PharmD Clinical Pharmacist [facility] [phone] (direct line)  [phone] (main office number)

## 2022-02-22 NOTE — Progress Notes (Signed)
Pharmacy Note  02/22/2022 Name: Kirk Sutton MRN: [id number] DOB: June 13, 1950  Subjective: Kirk Sutton is a 71 y.o. year old male who is a primary care patient of Colon Branch, MD. Clinical Pharmacist Practitioner referral was placed to assist with medication management.    Engaged with patient by telephone for follow up visit today.  Patient was seen by [name] - psychiatrist last week. Dose of Adderall was increased and buspirone was stopped. Patient suspected buspirone was causing irritability.  He also reports he stated Restasis eye drops this week after appointment with optometrist.  Medication list was updated.   Discussed bradycardia. Patient was seen in ED at [place] [facility]. Noted diaphoresis, cardiomegaly and sinus bradycardia. Metoprolol dose was decreased from '50mg'$  daily to '25mg'$  daily but he never changed dose. He has been monitoring his O2 sat level and HR daily.  Recent HR has been 53 to 71. He has had about 3 or 4 reading with HR in the 50's. He denies symptoms of sweating, lightheadedness. Patient reports he was instructed to keep HR between 50 and 90.  O2 sats have been 96 to 97%.   Allergies - seeing [name]. He reports he has occasional cough and uses either Mucinex or benzonatate. He requests refill on benzonatate - last refilled 06/2021 for #90 tablets.   Patient asks about referral to GI for colonoscopy (looks like he is past due to have colonoscopy). Last was 2018 and per HM was to be rechecked in 3 years but I could not find note from GI about when they recommended repeat colonoscopy.    Objective: Review of patient status, including review of consultants reports, laboratory and other test data, was performed as part of comprehensive.  Lab Results  Component Value Date   CREATININE 1.18 01/10/2022   CREATININE 1.17 06/19/2021   CREATININE 1.16 12/20/2020    Lab Results  Component Value Date   HGBA1C 6.1 01/10/2022       Component Value  Date/Time   CHOL 87 01/10/2022 1518   TRIG 83.0 01/10/2022 1518   TRIG 52 02/20/2006 1053   HDL 40.80 01/10/2022 1518   CHOLHDL 2 01/10/2022 1518   VLDL 16.6 01/10/2022 1518   LDLCALC 29 01/10/2022 1518   LDLCALC 83 12/23/2019 0828     Clinical ASCVD: No  The ASCVD Risk score (Arnett DK, et al., 2019) failed to calculate for the following reasons:   The valid total cholesterol range is 130 to 320 mg/dL    BP Readings from Last 3 Encounters:  01/10/22 126/64  10/03/21 132/82  06/19/21 128/68     Allergies  Allergen Reactions   Cetirizine Hcl     REACTION: prostatitis   Fexofenadine     REACTION: causes prostatitis    Medications Reviewed Today     Reviewed by Cherre Robins, RPH-CPP (Pharmacist) on 02/22/22 at 0950  Med List Status: <None>   Medication Order Taking? Sig Documenting Provider Last Dose Status Informant  acetaminophen (TYLENOL) 650 MG CR tablet [id number] Yes Take 650 mg by mouth every 8 (eight) hours as needed for pain. [provider] Taking Active            Med Note (CANTER, [facility] D   Tue Oct 03, 2021  9:58 AM) PRN  albuterol (VENTOLIN HFA) 108 (90 Base) MCG/ACT inhaler [id number] Yes INHALE 2 PUFFS INTO THE LUNGS EVERY 6 HOURS AS NEEDED FOR WHEEZING OR SHORTNESS OF BREATH Colon Branch, MD  Taking Active            Med Note (CANTER, KAYLYN D   Tue Oct 03, 2021  9:58 AM) PRN  amphetamine-dextroamphetamine (ADDERALL) 15 MG tablet [id number] Yes Take 15 mg by mouth in the morning and at bedtime. In morning and at noon [provider] Taking Active            Med Note (CANTER, Cheri Rous   Tue Dec 20, 2020  7:56 AM)    Ascorbic Acid (VITAMIN C) 1000 MG tablet [id number] Yes Take 1,000 mg by mouth daily. [provider] Taking Active   atorvastatin (LIPITOR) 20 MG tablet [id number] Yes Take 1 tablet (20 mg total) by mouth at bedtime. Colon Branch, MD Taking Active   azelastine (ASTELIN) 0.1 % nasal spray [id number] Yes Place 1 spray into both  nostrils every evening. [provider] Taking Active   benzonatate (TESSALON) 100 MG capsule [id number] Yes TAKE 2 CAPSULES(200 MG) BY MOUTH THREE TIMES DAILY AS NEEDED FOR COUGH Colon Branch, MD Taking Active            Med Note (CANTER, [facility] D   Tue Oct 03, 2021  9:58 AM) PRN  clobetasol ointment (TEMOVATE) 0.05 % [id number] Yes Apply 1 application topically 2 (two) times daily as needed. For hands [provider] Taking Active            Med Note (CANTER, [facility] D   Tue Oct 03, 2021  9:59 AM) PRN  Coenzyme Q10 100 MG capsule [id number] Yes Take 100 mg by mouth daily. [provider] Taking Active   cycloSPORINE (RESTASIS) 0.05 % ophthalmic emulsion [id number] Yes 1 drop 2 (two) times daily. [provider] Taking Active   desloratadine (CLARINEX) 5 MG tablet [id number] Yes Take 10 mg by mouth daily. [provider] Taking Active   dextromethorphan-guaiFENesin [facility] DM) 30-600 MG 12hr tablet [id number] Yes Take 1 tablet by mouth 2 (two) times daily. [provider] Taking Active            Med Note (CANTER, Cheri Rous   Wed Jan 10, 2022  2:52 PM) PRN  FLUoxetine (PROZAC) 40 MG capsule [id number] Yes Take 1 tablet daily [provider] Taking Active            Med Note Alinda Money, Margarite Gouge Apr 12, 2015  2:47 PM)    Fluticasone [facility] [place]) [id number] Yes Place into the nose daily as needed. [provider] Taking Active Self  fluticasone-salmeterol (ADVAIR HFA) 169-67 MCG/ACT inhaler [id number] Yes Inhale 2 puffs into the lungs in the morning and at bedtime. [provider] Taking Active   hydrochlorothiazide (HYDRODIURIL) 25 MG tablet [id number] Yes TAKE 1 TABLET(25 MG) BY MOUTH DAILY Colon Branch, MD Taking Active   Magnesium Gluconate 250 MG TABS [id number] Yes Take 400 mg by mouth daily. [provider] Taking Active   metoprolol succinate (TOPROL-XL) 50 MG 24 hr tablet [id number] Yes Take 1  tablet (50 mg total) by mouth daily. Colon Branch, MD Taking Active   montelukast (SINGULAIR) 10 MG tablet [id number] Yes TAKE 1 TABLET(10 MG) BY MOUTH AT BEDTIME Colon Branch, MD Taking Active   OVER THE COUNTER MEDICATION [id number] Yes Take 1 each by mouth daily as needed. CBD Gummy - for thumb / hand pain [provider] Taking Active            Med Note (CANTER, KAYLYN D  Tue Oct 03, 2021 10:02 AM) Every morning  pyridOXINE (VITAMIN B-6) 100 MG tablet [id number] Yes Take 400 mg by mouth daily. Super B complex [provider] Taking Active Self  terazosin (HYTRIN) 1 MG capsule [id number] Yes Take 3 capsules (3 mg total) by mouth daily. Colon Branch, MD Taking Active   Turmeric 500 MG CAPS [id number] Yes Take 1,000 mg by mouth daily. [provider] Taking Active   Vitamin D, Cholecalciferol, 25 MCG (1000 UT) CAPS [id number] Yes Take 1 capsule by mouth daily. [provider] Taking Active             Patient Active Problem List   Diagnosis Date Noted   Incarcerated umbilical hernia 90/38/3338   Cough variant asthma 06/19/2021   Flu-like symptoms 12/23/2020   Chronic back pain 04/30/2018   DJD (degenerative joint disease) 05/22/2017   Snoring 04/03/2016   PCP NOTES >>>>> 02/24/2015   Pain managment 03/08/2014   Anxiety state 07/15/2012   Annual physical exam 07/20/2010   Hyperglycemia 08/11/2008   PROSTATITIS, CHRONIC 03/31/2008   Dyslipidemia 10/13/2007   ALLERGIC RHINITIS 10/17/2006   Essential hypertension 04/09/2006   Extrinsic asthma 04/09/2006     Medication Assistance:  None required.  Patient affirms current coverage meets needs.   Assessment / Plan: Hypertension / bradycardia Continue metoprolol ER '50mg'$  daily. Patient to continue to check HR 2 to 3 times per week or if he has symptoms of low HR (fatigue, lightheadedness / dizziness or sweating)   Medication management:  Reviewed and updated medication list Reviewed refill history  and adherence  Adherence for 2023 for statin improved to 100%. In 2022 was only 64% Updated Rx for benzonatate #60 / 0 RF  Health Maintenance:  Reminded to get Tetanus booster and Shingrix series. Sent order for colonoscopy - patient can call office to schedule.   Follow Up:  Telephone follow up appointment with care management team member scheduled for:  6 months   Cherre Robins, PharmD Clinical Pharmacist [facility] [phone]

## 2022-03-07 ENCOUNTER — Ambulatory Visit (INDEPENDENT_AMBULATORY_CARE_PROVIDER_SITE_OTHER): Payer: Medicare (Managed Care) | Admitting: Medical

## 2022-03-07 VITALS — BP 130/74 | HR 68 | Temp 98.1°F | Resp 18 | Ht 71.0 in | Wt 237.0 lb

## 2022-03-07 DIAGNOSIS — R059 Cough, unspecified: Secondary | ICD-10-CM

## 2022-03-07 DIAGNOSIS — R062 Wheezing: Secondary | ICD-10-CM | POA: Diagnosis not present

## 2022-03-07 DIAGNOSIS — R0981 Nasal congestion: Secondary | ICD-10-CM | POA: Diagnosis not present

## 2022-03-07 LAB — POC COVID19 BINAXNOW: SARS Coronavirus 2 Ag: NEGATIVE

## 2022-03-07 MED ORDER — ALBUTEROL SULFATE HFA 108 (90 BASE) MCG/ACT IN AERS: 2.0000 | INHALATION_SPRAY | Freq: Four times a day (QID) | RESPIRATORY_TRACT | 5 refills | Status: AC | PRN

## 2022-03-07 MED ORDER — AZITHROMYCIN 250 MG PO TABS: ORAL_TABLET | ORAL | 0 refills | Status: AC

## 2022-03-07 NOTE — Addendum Note (Signed)
Addended by: Jeronimo Greaves on: 03/07/2022 02:19 PM   Modules accepted: Orders

## 2022-03-07 NOTE — Patient Instructions (Addendum)
Upper respiratory infection type symptoms with increasing cough that is now productive. Your covid test came back negative.  Advise use flonase for nasal congestion and use benzonatate for cough.  As we approach christmas holiday if your symptoms worsen or change as discussed then go ahead and start azithromycin. Sent to pharmacy/making available.  Refiling albuterol to use if needed for wheezing.  Follow up in 7 days or sooner if needed.

## 2022-03-07 NOTE — Progress Notes (Signed)
Subjective:    Patient ID: Kirk Sutton, male    DOB: 23-May-1950, 71 y.o.   MRN: [id number]  HPI  Pt in with st and cough for 3-5 days. He states on Sunday at church hurt to sing. No fever, no chills, no sweats or body.  Pt cough moderate. Some productive. He is taking benzonatate and it does help.  No sob. He is wheezing very rare and intermittent. He has advair and states his albuterol inhaler is empty.   He has family coming in.  Pt did not do covid test. He has family coming to visit.   No history of smoking.    Review of Systems  Constitutional:  Negative for chills, fatigue and fever.  HENT:  Positive for congestion and sore throat. Negative for ear pain, sinus pressure and sinus pain.   Respiratory:  Positive for cough and wheezing. Negative for chest tightness and shortness of breath.        Occasional wheezing  Cardiovascular:  Negative for chest pain and palpitations.  Gastrointestinal:  Negative for abdominal pain.  Genitourinary:  Negative for dysuria and flank pain.  Musculoskeletal:  Negative for back pain.  Skin:  Negative for rash.    Past Medical History:  Diagnosis Date   Allergic rhinitis    Asthma    Chronic prostatitis    sees urology   Diabetes mellitus    type 11 (a1c 6.0 03/2008)/no meds.   Hernia, umbilical    Hyperlipidemia    Hypertension    Psoriasis    @ hands, sees derm     Social History   Socioeconomic History   Marital status: Married    Spouse name: Not on file   Number of children: 2   Years of education: Not on file   Highest education level: Not on file  Occupational History   Occupation: Administrator, arts  Tobacco Use   Smoking status: Never   Smokeless tobacco: Never  Vaping Use   Vaping Use: Never used  Substance and Sexual Activity   Alcohol use: Yes    Comment: socially, rare   Drug use: No   Sexual activity: Yes  Other Topics Concern   Not on file  Social History Narrative    Household-- pt, wife    Social Determinants of Health   Financial Resource Strain: Low Risk  (04/20/2021)   Overall Financial Resource Strain (CARDIA)    Difficulty of Paying Living Expenses: Not hard at all  Food Insecurity: No [place] (04/20/2021)   Hunger Vital Sign    Worried About Running Out of Food in the Last Year: Never true    [place] in the Last Year: Never true  Transportation Needs: No Transportation Needs (04/20/2021)   PRAPARE - Hydrologist (Medical): No    Lack of Transportation (Non-Medical): No  Physical Activity: Sufficiently Active (02/22/2022)   Exercise Vital Sign    Days of Exercise per Week: 5 days    Minutes of Exercise per Session: 30 min  Stress: No Stress Concern Present (04/20/2021)   [place]    Feeling of Stress : Not at all  Social Connections: [place] (04/20/2021)   Social Connection and Isolation Panel [NHANES]    Frequency of Communication with Friends and Family: More than three times a week    Frequency of Social Gatherings with Friends and Family: More than  three times a week    Attends Religious Services: More than 4 times per year    Active Member of Clubs or Organizations: Yes    Attends Archivist Meetings: 1 to 4 times per year    Marital Status: Married  Human resources officer Violence: Not At Risk (04/20/2021)   Humiliation, Afraid, Rape, and Kick questionnaire    Fear of Current or Ex-Partner: No    Emotionally Abused: No    Physically Abused: No    Sexually Abused: No    Past Surgical History:  Procedure Laterality Date   APPENDECTOMY     FERTILITY SURGERY  03/19/1977   Spermatic Vein Ligation   HERNIA REPAIR     1950s   NASAL SEPTUM SURGERY  03/20/2007   SHOULDER SURGERY  03/19/2006   left   TESTICLE SURGERY     undescended repair    UMBILICAL HERNIA REPAIR  08/28/2021   VASECTOMY      Family History   Problem Relation Age of Onset   Diabetes Sister    Hypertension Mother    Colon polyps Mother    Hypertension Sister    Heart attack Father        x 3 onset age 42, died at 56   Colon polyps Father    Cancer Father        bladder   Colon cancer Neg Hx    Prostate cancer Neg Hx    Stomach cancer Neg Hx     Allergies  Allergen Reactions   Cetirizine Hcl     REACTION: prostatitis   Fexofenadine     REACTION: causes prostatitis    Current Outpatient Medications on File Prior to Visit  Medication Sig Dispense Refill   acetaminophen (TYLENOL) 650 MG CR tablet Take 650 mg by mouth every 8 (eight) hours as needed for pain.     albuterol (VENTOLIN HFA) 108 (90 Base) MCG/ACT inhaler INHALE 2 PUFFS INTO THE LUNGS EVERY 6 HOURS AS NEEDED FOR WHEEZING OR SHORTNESS OF BREATH 18 g 5   amphetamine-dextroamphetamine (ADDERALL) 15 MG tablet Take 15 mg by mouth in the morning and at bedtime. In morning and at noon     Ascorbic Acid (VITAMIN C) 1000 MG tablet Take 1,000 mg by mouth daily.     atorvastatin (LIPITOR) 20 MG tablet Take 1 tablet (20 mg total) by mouth at bedtime. 90 tablet 1   azelastine (ASTELIN) 0.1 % nasal spray Place 1 spray into both nostrils every evening.     benzonatate (TESSALON) 100 MG capsule TAKE 2 CAPSULES(200 MG) BY MOUTH THREE TIMES DAILY AS NEEDED FOR COUGH 60 capsule 0   clobetasol ointment (TEMOVATE) 1.93 % Apply 1 application topically 2 (two) times daily as needed. For hands     Coenzyme Q10 100 MG capsule Take 100 mg by mouth daily.     cycloSPORINE (RESTASIS) 0.05 % ophthalmic emulsion 1 drop 2 (two) times daily.     desloratadine (CLARINEX) 5 MG tablet Take 10 mg by mouth daily.     dextromethorphan-guaiFENesin (MUCINEX DM) 30-600 MG 12hr tablet Take 1 tablet by mouth 2 (two) times daily.     FLUoxetine (PROZAC) 40 MG capsule Take 1 tablet daily  2   Fluticasone Furoate (FLONASE SENSIMIST NA) Place into the nose daily as needed.     fluticasone-salmeterol  (ADVAIR HFA) 115-21 MCG/ACT inhaler Inhale 2 puffs into the lungs in the morning and at bedtime.     hydrochlorothiazide (HYDRODIURIL) 25 MG tablet  TAKE 1 TABLET(25 MG) BY MOUTH DAILY 90 tablet 1   Magnesium Gluconate 250 MG TABS Take 400 mg by mouth daily.     metoprolol succinate (TOPROL-XL) 50 MG 24 hr tablet Take 1 tablet (50 mg total) by mouth daily. 90 tablet 1   montelukast (SINGULAIR) 10 MG tablet TAKE 1 TABLET(10 MG) BY MOUTH AT BEDTIME 90 tablet 1   OVER THE COUNTER MEDICATION Take 1 each by mouth daily as needed. CBD Gummy - for thumb / hand pain     pyridOXINE (VITAMIN B-6) 100 MG tablet Take 400 mg by mouth daily. Super B complex     terazosin (HYTRIN) 1 MG capsule Take 3 capsules (3 mg total) by mouth daily. 270 capsule 1   Turmeric 500 MG CAPS Take 1,000 mg by mouth daily.     Vitamin D, Cholecalciferol, 25 MCG (1000 UT) CAPS Take 1 capsule by mouth daily.     No current facility-administered medications on file prior to visit.    BP 130/74   Pulse 68   Temp 98.1 F (36.7 C)   Resp 18   Ht '5\' 11"'$  (1.803 m)   Wt 237 lb (107.5 kg)   SpO2 95%   BMI 33.05 kg/m        Objective:   Physical Exam  General Mental Status- Alert. General Appearance- Not in acute distress.   Skin General: Color- Normal Color. Moisture- Normal Moisture.  Neck Carotid Arteries- Normal color. Moisture- Normal Moisture. No carotid bruits. No JVD.  Chest and Lung Exam Auscultation: Breath Sounds:-Normal.  Cardiovascular Auscultation:Rythm- Regular. Murmurs & Other Heart Sounds:Auscultation of the heart reveals- No Murmurs.  Abdomen Inspection:-Inspeection Normal. Palpation/Percussion:Note:No mass. Palpation and Percussion of the abdomen reveal- Non Tender, Non Distended + BS, no rebound or guarding.  Neurologic Cranial Nerve exam:- CN III-XII intact(No nystagmus), symmetric smile. Strength:- 5/5 equal and symmetric strength both upper and lower extremities.       Assessment &  Plan:   Patient Instructions  Upper respiratory infection type symptoms with increasing cough that is now productive. Your covid test came back negative.  Advise use flonase for nasal congestion and use benzonatate for cough.  As we approach christmas holiday if your symptoms worsen or change as discussed then go ahead and start azithromycin. Sent to pharmacy/making available.  Refiling albuterol to use if needed for wheezing.  Follow up in 7 days or sooner if needed.   Mackie Pai, PA-C

## 2022-03-11 ENCOUNTER — Other Ambulatory Visit: Payer: Self-pay | Admitting: Internal Medicine

## 2022-03-11 DIAGNOSIS — I10 Essential (primary) hypertension: Secondary | ICD-10-CM

## 2022-03-20 ENCOUNTER — Other Ambulatory Visit: Payer: Self-pay | Admitting: Internal Medicine

## 2022-03-21 DIAGNOSIS — M180 Bilateral primary osteoarthritis of first carpometacarpal joints: Secondary | ICD-10-CM | POA: Diagnosis not present

## 2022-03-26 DIAGNOSIS — J45991 Cough variant asthma: Secondary | ICD-10-CM | POA: Diagnosis not present

## 2022-03-26 DIAGNOSIS — J3089 Other allergic rhinitis: Secondary | ICD-10-CM | POA: Diagnosis not present

## 2022-03-26 DIAGNOSIS — J301 Allergic rhinitis due to pollen: Secondary | ICD-10-CM | POA: Diagnosis not present

## 2022-03-26 DIAGNOSIS — L2089 Other atopic dermatitis: Secondary | ICD-10-CM | POA: Diagnosis not present

## 2022-03-28 IMAGING — DX DG CHEST 2V
2 series · 2 of 2 positions shown · non-contrast
Comparison: Chest x-ray dated April 07, 2020

CLINICAL DATA: Cough

EXAM:
CHEST - 2 VIEW

[chest pa]
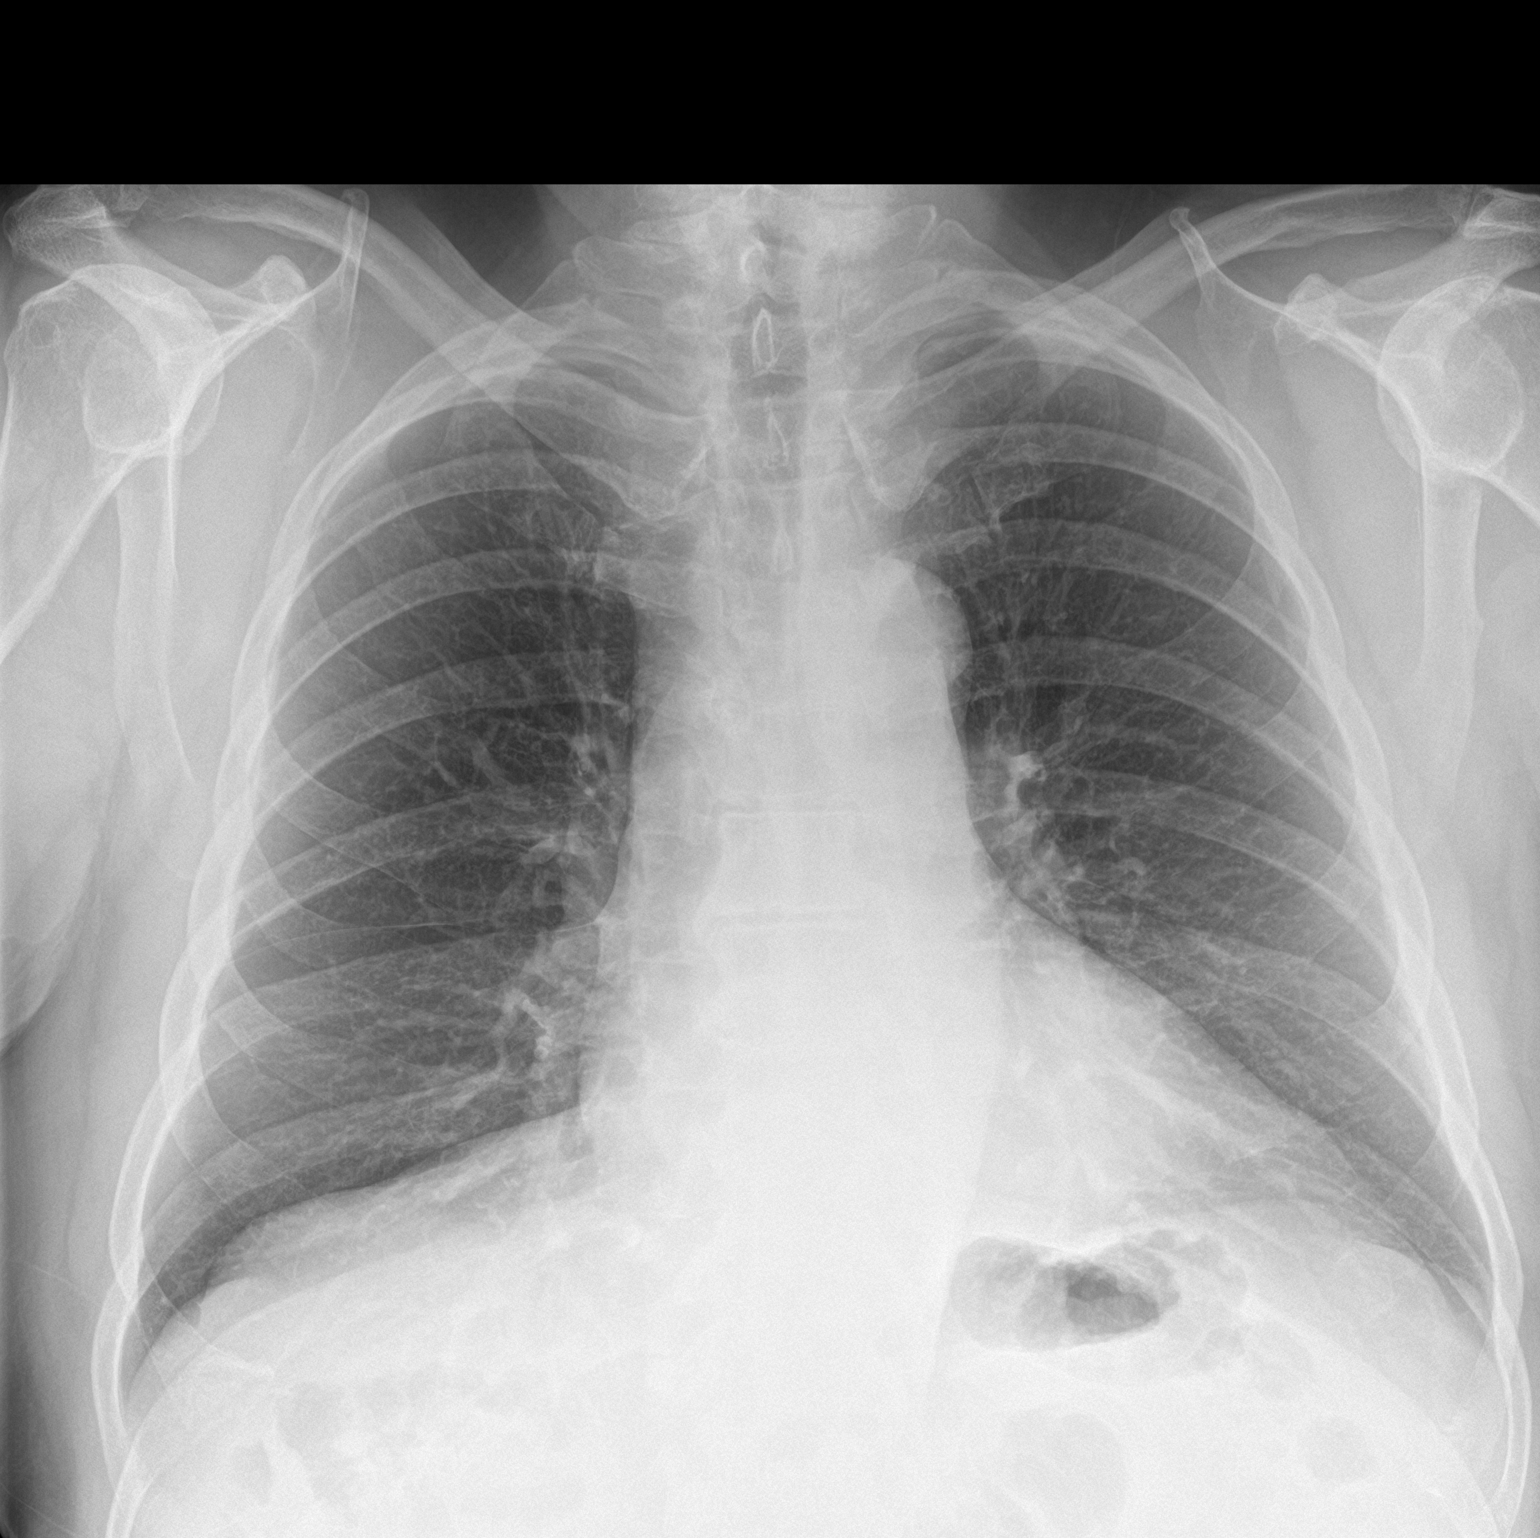

[chest lat]
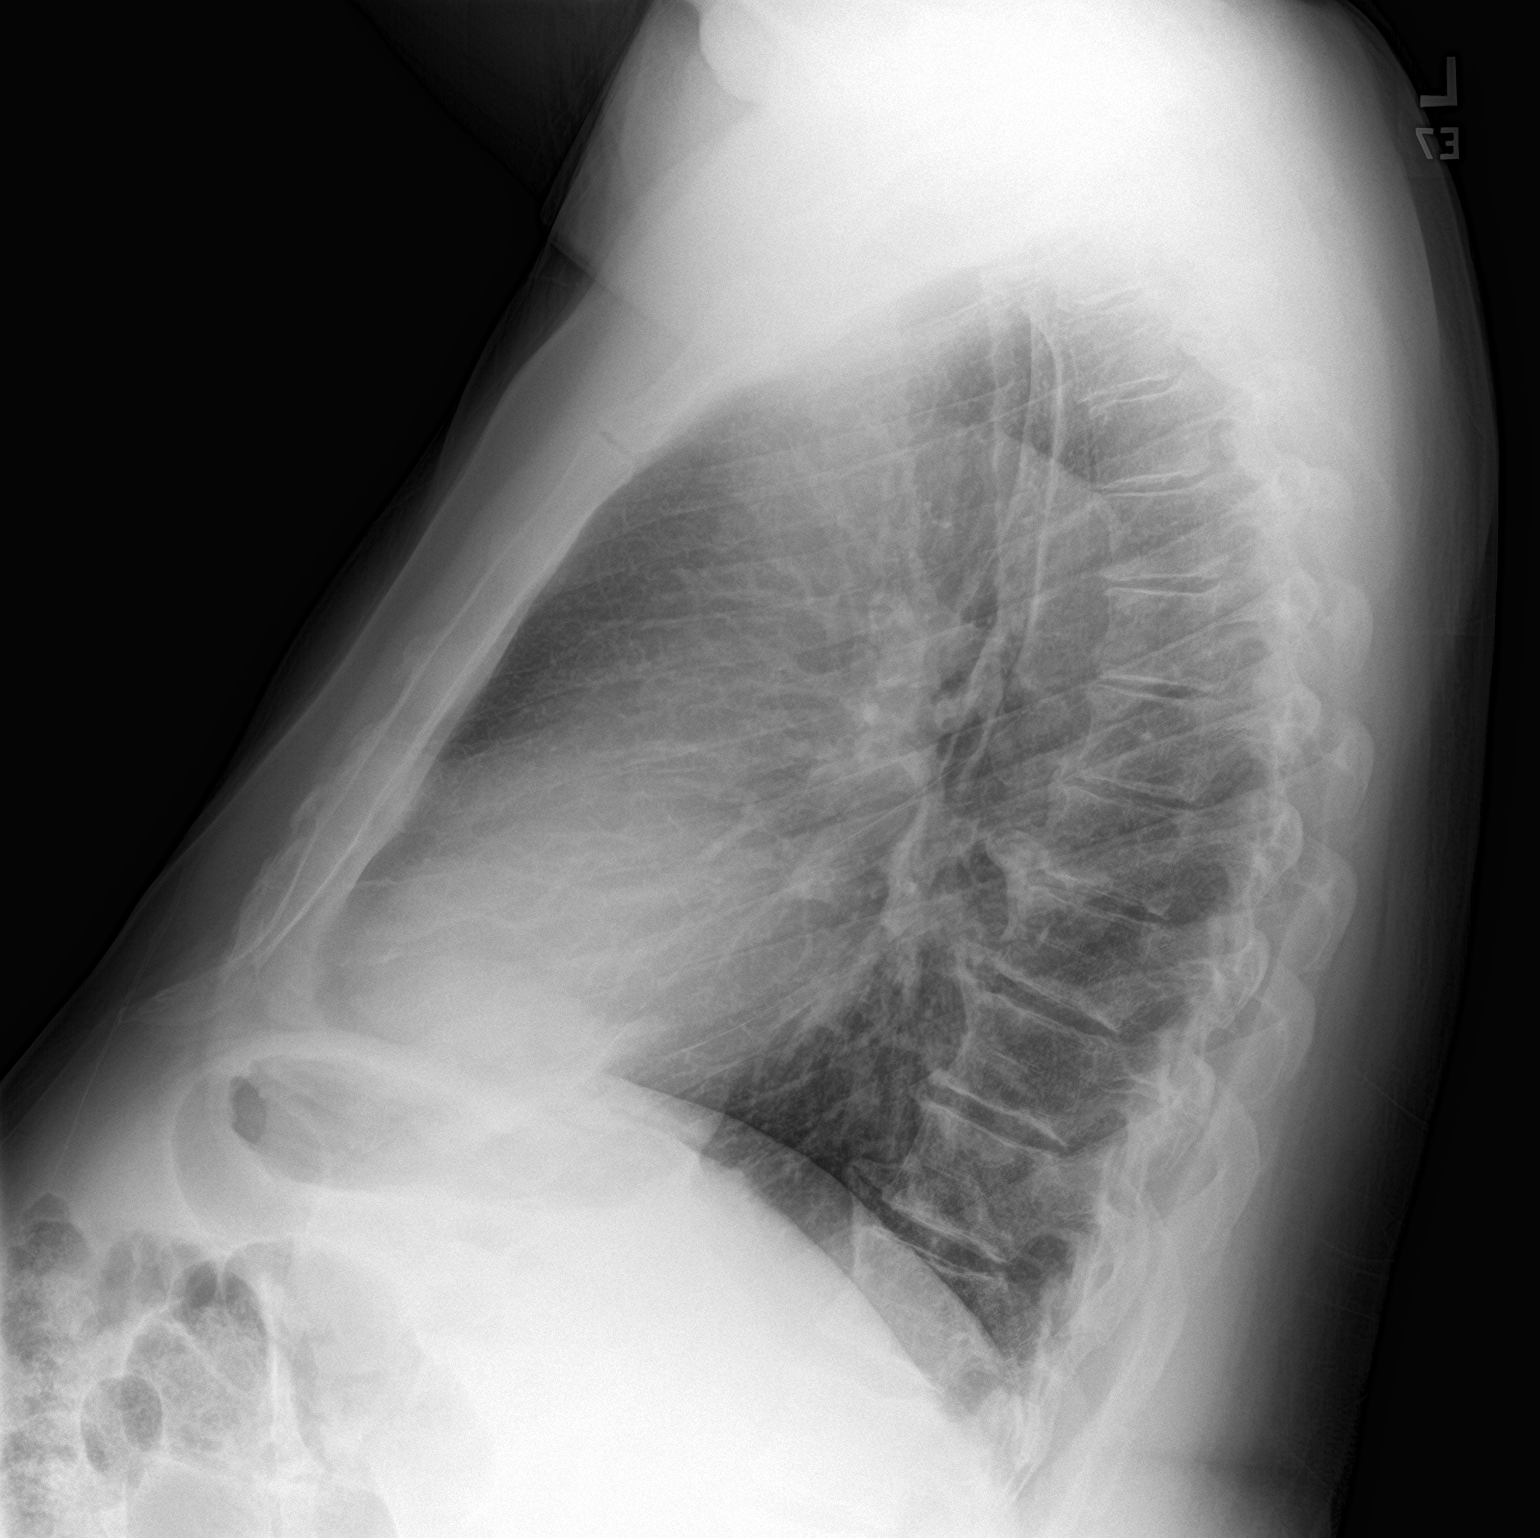

[2 of 2 positions shown; findings below may reference images not displayed]

FINDINGS: Cardiac and mediastinal contours are within normal limits. Clear
lungs. No evidence of pleural effusion or pneumothorax.
IMPRESSION: No active cardiopulmonary disease.

## 2022-03-30 DIAGNOSIS — N400 Enlarged prostate without lower urinary tract symptoms: Secondary | ICD-10-CM | POA: Diagnosis not present

## 2022-04-04 ENCOUNTER — Ambulatory Visit (INDEPENDENT_AMBULATORY_CARE_PROVIDER_SITE_OTHER): Payer: Medicare (Managed Care) | Admitting: Internal Medicine

## 2022-04-04 ENCOUNTER — Other Ambulatory Visit (HOSPITAL_BASED_OUTPATIENT_CLINIC_OR_DEPARTMENT_OTHER): Payer: Self-pay

## 2022-04-04 ENCOUNTER — Encounter: Payer: Self-pay | Admitting: Internal Medicine

## 2022-04-04 VITALS — BP 136/72 | HR 57 | Temp 97.5°F | Resp 16 | Ht 71.0 in | Wt 237.1 lb

## 2022-04-04 DIAGNOSIS — J029 Acute pharyngitis, unspecified: Secondary | ICD-10-CM

## 2022-04-04 DIAGNOSIS — R6889 Other general symptoms and signs: Secondary | ICD-10-CM

## 2022-04-04 DIAGNOSIS — J069 Acute upper respiratory infection, unspecified: Secondary | ICD-10-CM

## 2022-04-04 LAB — POC COVID19 BINAXNOW: SARS Coronavirus 2 Ag: NEGATIVE

## 2022-04-04 LAB — POCT RAPID STREP A (OFFICE): Rapid Strep A Screen: NEGATIVE

## 2022-04-04 MED ORDER — BENZONATATE 100 MG PO CAPS: ORAL_CAPSULE | ORAL | 0 refills | Status: DC

## 2022-04-04 MED ORDER — BENZONATATE 100 MG PO CAPS: 100.0000 mg | ORAL_CAPSULE | Freq: Three times a day (TID) | ORAL | 0 refills | Status: DC | PRN

## 2022-04-04 MED ORDER — BOOSTRIX 5-2.5-18.5 LF-MCG/0.5 IM SUSY
PREFILLED_SYRINGE | INTRAMUSCULAR | 0 refills | Status: DC
  Filled 2022-04-04: qty 0.5, 1d supply, fill #0

## 2022-04-04 NOTE — Progress Notes (Signed)
Subjective:    Patient ID: Kirk Sutton, male    DOB: 1950/09/26, 72 y.o.   MRN: [id number]  DOS:  04/04/2022 Type of visit - description: acute  Symptoms started 4 days ago: Sore throat, sneezing, sore throat increased with swallowing.  He denies fevers, some chills. + Mild right ear discomfort. Denies nausea vomiting or unusual aches or pains.  Recently saw his allergist for cough variant asthma, everything was okay.  Review of Systems See above   Past Medical History:  Diagnosis Date   Allergic rhinitis    Asthma    Chronic prostatitis    sees urology   Diabetes mellitus    type 11 (a1c 6.0 03/2008)/no meds.   Hernia, umbilical    Hyperlipidemia    Hypertension    Psoriasis    @ hands, sees derm    Past Surgical History:  Procedure Laterality Date   APPENDECTOMY     FERTILITY SURGERY  03/19/1977   Spermatic Vein Ligation   HERNIA REPAIR     1950s   NASAL SEPTUM SURGERY  03/20/2007   SHOULDER SURGERY  03/19/2006   left   TESTICLE SURGERY     undescended repair    UMBILICAL HERNIA REPAIR  08/28/2021   VASECTOMY      Current Outpatient Medications  Medication Instructions   acetaminophen (TYLENOL) 650 mg, Oral, Every 8 hours PRN   albuterol (VENTOLIN HFA) 108 (90 Base) MCG/ACT inhaler 2 puffs, Inhalation, Every 6 hours PRN   amphetamine-dextroamphetamine (ADDERALL) 15 MG tablet 15 mg, Oral, 2 times daily, In morning and at noon<BR>   atorvastatin (LIPITOR) 20 mg, Oral, Daily at bedtime   azelastine (ASTELIN) 0.1 % nasal spray 1 spray, Each Nare, Every evening   benzonatate (TESSALON) 100-200 mg, Oral, 3 times daily PRN   clobetasol ointment (TEMOVATE) 0.93 % 1 application , Topical, 2 times daily PRN, For hands    Coenzyme Q10 100 mg, Oral, Daily   cycloSPORINE (RESTASIS) 0.05 % ophthalmic emulsion 1 drop, 2 times daily   desloratadine (CLARINEX) 10 mg, Oral, Daily   dextromethorphan-guaiFENesin (MUCINEX DM) 30-600 MG 12hr tablet 1 tablet, Oral, 2 times  daily   FLUoxetine (PROZAC) 40 MG capsule Take 1 tablet daily   Fluticasone Furoate (FLONASE SENSIMIST NA) Nasal, Daily PRN   fluticasone-salmeterol (ADVAIR HFA) 115-21 MCG/ACT inhaler 2 puffs, Inhalation, 2 times daily   hydrochlorothiazide (HYDRODIURIL) 25 mg, Oral, Daily   Magnesium Gluconate 400 mg, Oral, Daily   metoprolol succinate (TOPROL-XL) 50 MG 24 hr tablet TAKE 1 TABLET(50 MG) BY MOUTH DAILY   montelukast (SINGULAIR) 10 MG tablet TAKE 1 TABLET(10 MG) BY MOUTH AT BEDTIME   OVER THE COUNTER MEDICATION 1 each, Oral, Daily PRN, CBD Gummy - for thumb / hand pain   pyridOXINE (VITAMIN B6) 400 mg, Oral, Daily, Super B complex   terazosin (HYTRIN) 3 mg, Oral, Daily   Turmeric 1,000 mg, Oral, Daily   vitamin C 1,000 mg, Oral, Daily   Vitamin D, Cholecalciferol, 25 MCG (1000 UT) CAPS 1 capsule, Oral, Daily       Objective:   Physical Exam BP 136/72   Pulse (!) 57   Temp (!) 97.5 F (36.4 C) (Oral)   Resp 16   Ht '5\' 11"'$  (1.803 m)   Wt 237 lb 2 oz (107.6 kg)   SpO2 98%   BMI 33.07 kg/m  General:   Well developed, NAD, BMI noted. HEENT:  Normocephalic . Face symmetric, atraumatic Throat: Symmetric, not red TMs,  limited exam due to wax but they look normal. Lungs:  Very mild rhonchi with cough only. Normal respiratory effort, no intercostal retractions, no accessory muscle use. Heart: RRR,  no murmur.  Lower extremities: no pretibial edema bilaterally  Skin: Not pale. Not jaundice Neurologic:  alert & oriented X3.  Speech normal, gait appropriate for age and unassisted Psych--  Cognition and judgment appear intact.  Cooperative with normal attention span and concentration.  Behavior appropriate. No anxious or depressed appearing.      Assessment     Assessment Prediabetes  HTN Hyperlipidemia Anxiety Dr Fay Records (psych)   Cough variant asthma, atopic dermatitis, allergic rhinitis and allergic conjunctivitis.Dr Ova Freshwater MSK:  --Chronic back pain, pain medication as  needed, used to see Dr Nelva Bush, did PT @ [facility] [phone], better --DJD- hands Chronic prostatitis, sees urology Psoriasis, hands, sees dermatology Snoring (severe per pt)- dentist Rx a moth guard ~ 08-2015, helps Negative a stress test 09-2021  PLAN URI: Strep and COVID test negative. Rx conservative treatment.  Rest, fluids, Tylenol, Robitussin DM, Tessalon Perles which he has taken before successfully.  Also nasal sprays. See AVS. If not better he will call, might need antibiotics.

## 2022-04-04 NOTE — Patient Instructions (Addendum)
  Rest, fluids , tylenol  For cough:  Take Mucinex DM or Robitussin-DM OTC.  Follow the instructions in the box. Also tessalon as needed   For nasal congestion: -Use over-the-counter Flonase: 2 nasal sprays on each side of the nose in the morning until you feel better  -Use OTC Astepro 2 nasal sprays on each side of the nose twice daily until better  Avoid decongestants such as  Pseudoephedrine or phenylephrine     Call if not gradually better over the next  10 days   Call anytime if the symptoms are severe, you have high fever, short of breath, chest pain

## 2022-04-04 NOTE — Assessment & Plan Note (Signed)
URI: Strep and COVID test negative. Rx conservative treatment.  Rest, fluids, Tylenol, Robitussin DM, Tessalon Perles which he has taken before successfully.  Also nasal sprays. See AVS. If not better he will call, might need antibiotics.

## 2022-04-06 DIAGNOSIS — N400 Enlarged prostate without lower urinary tract symptoms: Secondary | ICD-10-CM | POA: Diagnosis not present

## 2022-04-12 ENCOUNTER — Telehealth: Payer: Self-pay | Admitting: Internal Medicine

## 2022-04-12 MED ORDER — AZITHROMYCIN 250 MG PO TABS: ORAL_TABLET | ORAL | 0 refills | Status: DC

## 2022-04-12 NOTE — Telephone Encounter (Signed)
Please send a Z-Pak

## 2022-04-12 NOTE — Telephone Encounter (Signed)
Patient states he has gotten slightly better from his 01/17 OV but he is still coughing a lot. He would like to know if he can get on an antibiotic. Please advise.

## 2022-04-12 NOTE — Telephone Encounter (Signed)
Rx sent. Pt informed. Informed that if not better after abx, needs appt.

## 2022-04-12 NOTE — Telephone Encounter (Signed)
Please advise 

## 2022-04-15 ENCOUNTER — Other Ambulatory Visit: Payer: Self-pay | Admitting: Internal Medicine

## 2022-04-15 DIAGNOSIS — R6889 Other general symptoms and signs: Secondary | ICD-10-CM

## 2022-04-16 ENCOUNTER — Other Ambulatory Visit: Payer: Self-pay | Admitting: Internal Medicine

## 2022-04-16 DIAGNOSIS — R6889 Other general symptoms and signs: Secondary | ICD-10-CM

## 2022-04-25 ENCOUNTER — Telehealth: Payer: Self-pay | Admitting: Internal Medicine

## 2022-04-25 DIAGNOSIS — R6889 Other general symptoms and signs: Secondary | ICD-10-CM

## 2022-04-25 MED ORDER — BENZONATATE 100 MG PO CAPS: 100.0000 mg | ORAL_CAPSULE | Freq: Three times a day (TID) | ORAL | 0 refills | Status: DC | PRN

## 2022-04-25 NOTE — Telephone Encounter (Signed)
Patient called to see if he could get a refill on benzonatate (TESSALON) 100 MG capsule. Please send it to :  [facility] [place], [address] AT [facility] [address] [place], [place] [address] Phone: [phone]  Fax: [phone]    Please call patient to advise if he is unable to get it.

## 2022-04-25 NOTE — Telephone Encounter (Signed)
Rx sent 

## 2022-04-26 ENCOUNTER — Ambulatory Visit: Payer: Medicare (Managed Care)

## 2022-04-27 DIAGNOSIS — J45991 Cough variant asthma: Secondary | ICD-10-CM | POA: Diagnosis not present

## 2022-04-27 DIAGNOSIS — J301 Allergic rhinitis due to pollen: Secondary | ICD-10-CM | POA: Diagnosis not present

## 2022-04-27 DIAGNOSIS — J3089 Other allergic rhinitis: Secondary | ICD-10-CM | POA: Diagnosis not present

## 2022-04-27 DIAGNOSIS — L2089 Other atopic dermatitis: Secondary | ICD-10-CM | POA: Diagnosis not present

## 2022-05-04 ENCOUNTER — Ambulatory Visit (INDEPENDENT_AMBULATORY_CARE_PROVIDER_SITE_OTHER): Payer: Medicare (Managed Care) | Admitting: *Deleted

## 2022-05-04 VITALS — BP 121/77 | HR 71 | Ht 71.0 in | Wt 239.4 lb

## 2022-05-04 DIAGNOSIS — Z0000 Encounter for general adult medical examination without abnormal findings: Secondary | ICD-10-CM

## 2022-05-04 NOTE — Progress Notes (Signed)
Subjective:   Kirk Sutton is a 72 y.o. male who presents for Medicare Annual/Subsequent preventive examination.  Review of Systems    Defer to PCP Cardiac Risk Factors include: advanced age (>66mn, >>47women);dyslipidemia;hypertension;male gender;obesity (BMI >30kg/m2)     Objective:    Today's Vitals   05/04/22 0818  BP: 121/77  Pulse: 71  Weight: 239 lb 6.4 oz (108.6 kg)  Height: 5' 11"$  (1.803 m)   Body mass index is 33.39 kg/m.     05/04/2022    8:26 AM 04/20/2021    8:28 AM 04/14/2020    9:04 AM 03/27/2019    8:13 AM 12/24/2016    7:42 AM 11/11/2015    9:49 AM  Advanced Directives  Does Patient Have a Medical Advance Directive? Yes Yes Yes Yes Yes Yes  Type of AParamedicof [place]andaLiving will;Out of facility DNR (pink MOST or yellow form) [place] will [place]Living will [place] will [place]Living will Living will  Does patient want to make changes to medical advance directive? No - Patient declined   No - Patient declined    Copy of [place] Chart? No - copy requested No - copy requested No - copy requested No - copy requested  No - copy requested    Current Medications (verified) Outpatient Encounter Medications as of 05/04/2022  Medication Sig   acetaminophen (TYLENOL) 650 MG CR tablet Take 650 mg by mouth every 8 (eight) hours as needed for pain.   albuterol (VENTOLIN HFA) 108 (90 Base) MCG/ACT inhaler Inhale 2 puffs into the lungs every 6 (six) hours as needed for wheezing or shortness of breath.   amphetamine-dextroamphetamine (ADDERALL) 15 MG tablet Take 15 mg by mouth in the morning and at bedtime. In morning and at noon   Ascorbic Acid (VITAMIN C) 1000 MG tablet Take 1,000 mg by mouth daily.   atorvastatin (LIPITOR) 20 MG tablet Take 1 tablet (20 mg total) by mouth at bedtime.   azelastine (ASTELIN) 0.1 % nasal spray  Place 1 spray into both nostrils every evening.   benzonatate (TESSALON) 100 MG capsule Take 1-2 capsules (100-200 mg total) by mouth 3 (three) times daily as needed for cough.   clobetasol ointment (TEMOVATE) 0AB-123456789% Apply 1 application topically 2 (two) times daily as needed. For hands   Coenzyme Q10 100 MG capsule Take 100 mg by mouth daily.   cycloSPORINE (RESTASIS) 0.05 % ophthalmic emulsion 1 drop 2 (two) times daily.   desloratadine (CLARINEX) 5 MG tablet Take 10 mg by mouth daily.   dextromethorphan-guaiFENesin (MUCINEX DM) 30-600 MG 12hr tablet Take 1 tablet by mouth 2 (two) times daily.   FLUoxetine (PROZAC) 40 MG capsule Take 1 tablet daily   Fluticasone Furoate (FLONASE SENSIMIST NA) Place into the nose daily as needed.   fluticasone-salmeterol (ADVAIR HFA) 115-21 MCG/ACT inhaler Inhale 2 puffs into the lungs in the morning and at bedtime.   hydrochlorothiazide (HYDRODIURIL) 25 MG tablet Take 1 tablet (25 mg total) by mouth daily.   Magnesium Gluconate 250 MG TABS Take 400 mg by mouth daily.   metoprolol succinate (TOPROL-XL) 50 MG 24 hr tablet TAKE 1 TABLET(50 MG) BY MOUTH DAILY   montelukast (SINGULAIR) 10 MG tablet TAKE 1 TABLET(10 MG) BY MOUTH AT BEDTIME   OVER THE COUNTER MEDICATION Take 1 each by mouth daily as needed. CBD Gummy - for thumb / hand pain   pyridOXINE (  VITAMIN B-6) 100 MG tablet Take 400 mg by mouth daily. Super B complex   terazosin (HYTRIN) 1 MG capsule Take 3 capsules (3 mg total) by mouth daily.   Turmeric 500 MG CAPS Take 1,000 mg by mouth daily.   Vitamin D, Cholecalciferol, 25 MCG (1000 UT) CAPS Take 1 capsule by mouth daily.   [DISCONTINUED] azithromycin (ZITHROMAX) 250 MG tablet Take 2 tablets by mouth first day, then 1 tablet for 4 additional days   [DISCONTINUED] Tdap (BOOSTRIX) 5-2.5-18.5 LF-MCG/0.5 injection Inject into the muscle.   No facility-administered encounter medications on file as of 05/04/2022.    Allergies (verified) Cetirizine hcl and  Fexofenadine   History: Past Medical History:  Diagnosis Date   Allergic rhinitis    Asthma    Chronic prostatitis    sees urology   Diabetes mellitus    type 11 (a1c 6.0 03/2008)/no meds.   Hernia, umbilical    Hyperlipidemia    Hypertension    Psoriasis    @ hands, sees derm   Past Surgical History:  Procedure Laterality Date   APPENDECTOMY     FERTILITY SURGERY  03/19/1977   Spermatic Vein Ligation   HERNIA REPAIR     1950s   NASAL SEPTUM SURGERY  03/20/2007   SHOULDER SURGERY  03/19/2006   left   TESTICLE SURGERY     undescended repair    UMBILICAL HERNIA REPAIR  08/28/2021   VASECTOMY     Family History  Problem Relation Age of Onset   Diabetes Sister    Hypertension Mother    Colon polyps Mother    Hypertension Sister    Heart attack Father        x 3 onset age 22, died at 37   Colon polyps Father    Cancer Father        bladder   Colon cancer Neg Hx    Prostate cancer Neg Hx    Stomach cancer Neg Hx    Social History   Socioeconomic History   Marital status: Married    Spouse name: Not on file   Number of children: 2   Years of education: Not on file   Highest education level: Not on file  Occupational History   Occupation: Administrator, arts  Tobacco Use   Smoking status: Never   Smokeless tobacco: Never  Vaping Use   Vaping Use: Never used  Substance and Sexual Activity   Alcohol use: Yes    Comment: socially, rare   Drug use: No   Sexual activity: Yes  Other Topics Concern   Not on file  Social History Narrative   Household-- pt, wife    Social Determinants of Health   Financial Resource Strain: Low Risk  (04/20/2021)   Overall Financial Resource Strain (CARDIA)    Difficulty of Paying Living Expenses: Not hard at all  Food Insecurity: No Food Insecurity (05/04/2022)   Hunger Vital Sign    Worried About Running Out of Food in the Last Year: Never true    Ran Out of Food in the Last Year: Never true   Transportation Needs: No Transportation Needs (05/04/2022)   PRAPARE - Hydrologist (Medical): No    Lack of Transportation (Non-Medical): No  Physical Activity: Sufficiently Active (02/22/2022)   Exercise Vital Sign    Days of Exercise per Week: 5 days    Minutes of Exercise per Session: 30 min  Stress: No Stress Concern  Present (04/20/2021)   [place] of Stress : Not at all  Social Connections: [place] (04/20/2021)   Social Connection and Isolation Panel [NHANES]    Frequency of Communication with Friends and Family: More than three times a week    Frequency of Social Gatherings with Friends and Family: More than three times a week    Attends Religious Services: More than 4 times per year    Active Member of [employer] or Organizations: Yes    Attends Archivist Meetings: 1 to 4 times per year    Marital Status: Married    Tobacco Counseling Counseling given: Not Answered   Clinical Intake:  Pre-visit preparation completed: Yes  Pain : No/denies pain  Diabetes: No  How often do you need to have someone help you when you read instructions, pamphlets, or other written materials from your doctor or pharmacy?: 1 - Never  Activities of Daily Living    05/04/2022    8:34 AM  In your present state of health, do you have any difficulty performing the following activities:  Hearing? 0  Vision? 0  Difficulty concentrating or making decisions? 0  Walking or climbing stairs? 0  Dressing or bathing? 0  Doing errands, shopping? 0  Preparing Food and eating ? N  Using the Toilet? N  In the past six months, have you accidently leaked urine? Y  Do you have problems with loss of bowel control? N  Managing your Medications? N  Managing your Finances? N  Housekeeping or managing your Housekeeping? N    Patient Care Team: Colon Branch, MD as PCP -  General Thomasene Mohair Verner Chol., MD as Consulting Physician (Urology) Calvert Cantor, MD as Consulting Physician (Ophthalmology) Orbie Hurst, MD as Consulting Physician (Dermatology) Gardiner Barefoot, DPM as Consulting Physician (Podiatry) Roseanne Kaufman, MD as Consulting Physician (Orthopedic Surgery) Ricard Dillon, MD (Psychiatry) Suella Broad, MD as Consulting Physician (Physical Medicine and Rehabilitation) Tiajuana Amass, MD as Referring Physician (Allergy and Immunology) Cherre Robins, RPH-CPP (Pharmacist)  Indicate any recent Medical Services you may have received from other than [facility] providers in the past year (date may be approximate).     Assessment:   This is a routine wellness examination for [place].  Hearing/Vision screen No results found.  Dietary issues and exercise activities discussed: Current Exercise Habits: Home exercise routine, Type of exercise: walking, Time (Minutes): 20, Frequency (Times/Week): 7, Weekly Exercise (Minutes/Week): 140, Intensity: Mild, Exercise limited by: orthopedic condition(s)   Goals Addressed             This Visit's Progress    Increase physical activity   On track      Depression Screen    05/04/2022    8:30 AM 04/04/2022    8:26 AM 01/10/2022    2:53 PM 06/19/2021    8:45 AM 04/20/2021    8:32 AM 12/20/2020    8:04 AM 04/14/2020    9:07 AM  PHQ 2/9 Scores  PHQ - 2 Score 0 0 0 0 0 0 0    Fall Risk    05/04/2022    8:26 AM 04/04/2022    8:25 AM 01/10/2022    2:53 PM 06/19/2021    8:45 AM 04/20/2021    8:29 AM  [place] in the past year? 0 0 1 0 0  Number falls in past yr: 0 0 0 0 0  Injury with  Fall? 0 0 0 0 0  Risk for fall due to : No Fall Risks      Follow up Falls evaluation completed Falls evaluation completed Falls evaluation completed Falls evaluation completed Falls prevention discussed    FALL RISK PREVENTION PERTAINING TO THE HOME:  Any stairs in or around the home? Yes  If so, are there any  without handrails? No  Home free of loose throw rugs in walkways, pet beds, electrical cords, etc? Yes  Adequate lighting in your home to reduce risk of falls? Yes   ASSISTIVE DEVICES UTILIZED TO PREVENT FALLS:  Life alert? No  Use of a cane, walker or w/c? No  Grab bars in the bathroom? No  Shower chair or bench in shower?  Built in Elevated toilet seat or a handicapped toilet? No   TIMED UP AND GO:  Was the test performed? Yes .  Length of time to ambulate 10 feet: 5 sec.   Gait steady and fast without use of assistive device  Cognitive Function:    11/11/2015    9:54 AM  MMSE - Mini Mental State Exam  Orientation to time 5  Orientation to Place 5  Registration 3  Attention/ Calculation 5  Recall 3  Language- name 2 objects 2  Language- repeat 1  Language- follow 3 step command 3  Language- read & follow direction 1  Write a sentence 1  Copy design 1  Total score 30        05/04/2022    8:39 AM  6CIT Screen  What Year? 0 points  What month? 0 points  What time? 0 points  Count back from 20 0 points  Months in reverse 0 points  Repeat phrase 4 points  Total Score 4 points    Immunizations Immunization History  Administered Date(s) Administered   Fluad Quad(high Dose 65+) 12/22/2018, 12/20/2020, 01/10/2022   H1N1 03/31/2008   Influenza Split 01/22/2011, 01/10/2012   Influenza Whole 01/17/2007, 12/24/2007, 11/17/2009   Influenza, High Dose Seasonal PF 02/23/2015, 01/13/2016, 01/02/2017, 01/16/2018, 03/27/2021   Influenza,inj,Quad PF,6+ Mos 01/20/2013, 03/08/2014, 12/23/2019   PFIZER Comirnaty(Gray Top)Covid-19 Tri-Sucrose Vaccine 05/21/2019, 07/13/2019   PFIZER(Purple Top)SARS-COV-2 Vaccination 05/21/2019, 07/13/2019, 06/01/2020   Pneumococcal Conjugate-13 09/03/2014   Pneumococcal Polysaccharide-23 09/01/2013, 03/27/2021   Td 03/19/2001   Tdap 08/22/2011, 04/04/2022   Zoster, Live 08/22/2011    Pneumococcal vaccine status: Up to date  Flu Vaccine  status: Up to date  Pneumococcal vaccine status: Up to date  Covid-19 vaccine status: Declined, Education has been provided regarding the importance of this vaccine but patient still declined. Advised may receive this vaccine at local pharmacy or Health Dept.or vaccine clinic. Aware to provide a copy of the vaccination record if obtained from local pharmacy or Health Dept. Verbalized acceptance and understanding.  Qualifies for Shingles Vaccine? Yes   Zostavax completed Yes   Shingrix Completed?: No.    Education has been provided regarding the importance of this vaccine. Patient has been advised to call insurance company to determine out of pocket expense if they have not yet received this vaccine. Advised may also receive vaccine at local pharmacy or Health Dept. Verbalized acceptance and understanding.  Screening Tests Health Maintenance  Topic Date Due   Zoster Vaccines- Shingrix (1 of 2) Never done   COLONOSCOPY (Pts 45-24yr Insurance coverage will need to be confirmed)  02/19/2020   Medicare Annual Wellness (AWV)  04/20/2022   DTaP/Tdap/Td (4 - Td or Tdap) 04/04/2032   Pneumonia Vaccine 65+  Years old  Completed   INFLUENZA VACCINE  Completed   Hepatitis C Screening  Completed   HPV VACCINES  Aged Out   COVID-19 Vaccine  Discontinued    Health Maintenance  Health Maintenance Due  Topic Date Due   Zoster Vaccines- Shingrix (1 of 2) Never done   COLONOSCOPY (Pts 45-45yr Insurance coverage will need to be confirmed)  02/19/2020   Medicare Annual Wellness (AWV)  04/20/2022    Colorectal cancer screening: Type of screening: Colonoscopy. Completed 02/18/17. Repeat every 3 years pt will schedule  Lung Cancer Screening: (Low Dose CT Chest recommended if Age 10356-80years, 30 pack-year currently smoking OR have quit w/in 15years.) does not qualify.   Additional Screening:  Hepatitis C Screening: does qualify; Completed 02/23/15  Vision Screening: Recommended annual ophthalmology  exams for early detection of glaucoma and other disorders of the eye. Is the patient up to date with their annual eye exam?  Yes  Who is the provider or what is the name of the office in which the patient attends annual eye exams? [facility] If pt is not established with a provider, would they like to be referred to a provider to establish care? No .   Dental Screening: Recommended annual dental exams for proper oral hygiene  Community Resource Referral / Chronic Care Management: CRR required this visit?  No   CCM required this visit?  No      Plan:     I have personally reviewed and noted the following in the patient's chart:   Medical and social history Use of alcohol, tobacco or illicit drugs  Current medications and supplements including opioid prescriptions. Patient is not currently taking opioid prescriptions. Functional ability and status Nutritional status Physical activity Advanced directives List of other physicians Hospitalizations, surgeries, and ER visits in previous 12 months Vitals Screenings to include cognitive, depression, and falls Referrals and appointments  In addition, I have reviewed and discussed with patient certain preventive protocols, quality metrics, and best practice recommendations. A written personalized care plan for preventive services as well as general preventive health recommendations were provided to patient.     B[place]ris Ship [place]  05/04/2022   Nurse Notes: None

## 2022-05-04 NOTE — Patient Instructions (Signed)
Kirk Sutton , Thank you for taking time to come for your Medicare Wellness Visit. I appreciate your ongoing commitment to your health goals. Please review the following plan we discussed and let me know if I can assist you in the future.   These are the goals we discussed:  Goals      A1c goal less than 6.5%     Blood pressure goal less than 140/90     Check blood pressure 1-2 [facility] per [facility] or at [facility] and record readings     Chronic Care Management Pharmacy Care Plan     CARE PLAN ENTRY (see longitudinal plan of care for additional care plan information)  Current Barriers:  Chronic Disease Management support, education, and care coordination needs related to Asthma, Pre-Diabetes, Hypertension, Hyperlipidemia, Anxiety, BPH, Allergies   Hypertension BP Readings from Last 3 Encounters:  06/19/21 128/68  04/20/21 132/80  04/10/21 126/68  Pharmacist Clinical Goal(s): Over the next 90 days, patient will work with PharmD and providers to achieve BP goal <130/80 Current regimen:  Hydrochlorothiazide  66m daily each morning Metoprolol succinate 524mdaily at bedtime Interventions: Check blood pressure 1 to 2 times per week. Counseled on BP goal of <130/80; Great job getting blood pressure to goal! Patient self care activities - Over the next 90 days, patient will: Check blood pressure 2 to 3 times  per week and provide at future appointments Ensure daily salt intake < 2300 mg/day  Hyperlipidemia/ASCVD Risk Lab Results  Component Value Date/Time   LDLCALC 34 12/20/2020 08:34 AM   LDLCALC 83 12/23/2019 08:28 AM  Pharmacist Clinical Goal(s): Over the next 90 days, patient will work with PharmD and providers to maintain LDL goal < 100 Current regimen:  Atorvastatin 2046maily Co Enzyme Q10 - 100m26mily    Interventions: Encouraged patient to restart exercise with goal of at least 150 minutes per week  Patient self care activities - Over the next 90 days, patient  will: Continue atorvastatin and CoEnzyme Q10 100mg20mly Exercise with goal of at least 150 minutes per week as able Ok to start Omega XL 300mg 53my (can help with triglycerides / cholesterol and inflammation)  Pre-Diabetes Lab Results  Component Value Date/Time   HGBA1C 6.2 12/20/2020 08:34 AM   HGBA1C 6.2 04/26/2020 08:28 AM  Pharmacist Clinical Goal(s): Over the next 90 days, patient will work with PharmD and providers to maintain A1c goal <6.5% Current regimen:  Diet and exercise management   Interventions: Discussed importance exercise Patient self care activities - Over the next 90 days, patient will: Continue to limit sugar. Increase intake of vegetables - especially green leafy vegetables.  Goal for exercise is at least 150 minutes per week as able Maintain a1c less than 6.5%  Asthma / Allergies:  Pharmacist Clinical Goal(s): Over the next 90 days, patient will work with PharmD and providers to improve breathing and allergy symptoms Current regimen:  Albuterol inhaler as needed for wheezing or shortness of breath Advair HFA 115/21 inhale 2 puffs twice a day Astelin nasal spray as needed Flonase nasal spray as needed Montelukast 10mg d68m at night  Desloratidine 5mg dai7m Pazeo Solution - 1 drop in both eyes as needed Interventions: Reviewed above medications and dosing Patient self care activities - Over the next 90 days, patient will: Use maintenance inhaler daily - Advair Avoid allergy / cold preparations with decongestants as they can affect blood pressure and prostate.  Health maintenance:  Interventions: Reviewed vaccination histroy  Patient self care activities - Over the next 90 days, patient will: Check with pharmacy about cost of Shingrix vaccine for 2023 - should be lower cost than in past.  Get updated Tetanus booster Consider colonoscopy after healed from hernia surgery  Medication management Pharmacist Clinical Goal(s): Over the next 90 days,  patient will work with PharmD and providers to maintain optimal medication adherence Current pharmacy: [facility] Interventions Comprehensive medication review performed. Continue current medication management strategy Patient self care activities - Over the next 90 days, patient will: Focus on medication adherence by filling and taking medications appropriately  Take medications as prescribed Report any questions or concerns to PharmD and/or provider(s)  Patient Goals/Self-Care Activities Take medications as prescribed,  Target a minimum of 150 minutes of moderate intensity exercise weekly - once cleared by surgeon. Engage in dietary modifications by limiting serving sizes and intake of sugar (Great job increasing vegetable and limiting fast food / eating out) Get Shingrix and Tetanus booster vaccinations Plan to get colonoscopy later in 2023 when fully recovered from hernia surgery. Ok to start Omega XL 365m daily  Avoid allergy / cold preparations with decongestants as they can affect blood pressure and prostate.  Please see past updates related to this goal by clicking on the "Past Updates" button in the selected goal       Consider practicing meditation to help with anxiety and to lower blood pressure     DIET - INCREASE WATER INTAKE     Increase physical activity     Weight (lb) < 215 lb (97.5 kg)        This is a list of the screening recommended for you and due dates:  Health Maintenance  Topic Date Due   Zoster (Shingles) Vaccine (1 of 2) Never done   Colon Cancer Screening  02/19/2020   Medicare Annual Wellness Visit  05/05/2023   DTaP/Tdap/Td vaccine (4 - Td or Tdap) 04/04/2032   Pneumonia Vaccine  Completed   Flu Shot  Completed   Hepatitis C Screening: USPSTF Recommendation to screen - Ages 18-79 yo.  Completed   HPV Vaccine  Aged Out   COVID-19 Vaccine  Discontinued     Next appointment: Follow up in one year for your annual wellness visit.   Preventive Care  625Years and Older, Male Preventive care refers to lifestyle choices and visits with your health care provider that can promote health and wellness. What does preventive care include? A yearly physical exam. This is also called an annual well check. Dental exams once or twice a year. Routine eye exams. Ask your health care provider how often you should have your eyes checked. Personal lifestyle choices, including: Daily care of your teeth and gums. Regular physical activity. Eating a healthy diet. Avoiding tobacco and drug use. Limiting alcohol use. Practicing safe sex. Taking low doses of aspirin every day. Taking vitamin and mineral supplements as recommended by your health care provider. What happens during an annual well check? The services and screenings done by your health care provider during your annual well check will depend on your age, overall health, lifestyle risk factors, and family history of disease. Counseling  Your health care provider may ask you questions about your: Alcohol use. Tobacco use. Drug use. Emotional well-being. Home and relationship well-being. Sexual activity. Eating habits. History of falls. Memory and ability to understand (cognition). Work and work eStatistician Screening  You may have the following tests or measurements: Height, weight, and BMI. Blood pressure. Lipid  and cholesterol levels. These may be checked every 5 years, or more frequently if you are over 60 years old. Skin check. Lung cancer screening. You may have this screening every year starting at age 20 if you have a 30-pack-year history of smoking and currently smoke or have quit within the past 15 years. Fecal occult blood test (FOBT) of the stool. You may have this test every year starting at age 6. Flexible sigmoidoscopy or colonoscopy. You may have a sigmoidoscopy every 5 years or a colonoscopy every 10 years starting at age 28. Prostate cancer screening. Recommendations  will vary depending on your family history and other risks. Hepatitis C blood test. Hepatitis B blood test. Sexually transmitted disease (STD) testing. Diabetes screening. This is done by checking your blood sugar (glucose) after you have not eaten for a while (fasting). You may have this done every 1-3 years. Abdominal aortic aneurysm (AAA) screening. You may need this if you are a current or former smoker. Osteoporosis. You may be screened starting at age 35 if you are at high risk. Talk with your health care provider about your test results, treatment options, and if necessary, the need for more tests. Vaccines  Your health care provider may recommend certain vaccines, such as: Influenza vaccine. This is recommended every year. Tetanus, diphtheria, and acellular pertussis (Tdap, Td) vaccine. You may need a Td booster every 10 years. Zoster vaccine. You may need this after age 60. Pneumococcal 13-valent conjugate (PCV13) vaccine. One dose is recommended after age 36. Pneumococcal polysaccharide (PPSV23) vaccine. One dose is recommended after age 8. Talk to your health care provider about which screenings and vaccines you need and how often you need them. This information is not intended to replace advice given to you by your health care provider. Make sure you discuss any questions you have with your health care provider. Document Released: 04/01/2015 Document Revised: 11/23/2015 Document Reviewed: 01/04/2015 Elsevier Interactive Patient Education  2017 [place] Prevention in the Home Falls can cause injuries. They can happen to people of all ages. There are many things you can do to make your home safe and to help prevent falls. What can I do on the outside of my home? Regularly fix the edges of walkways and driveways and fix any cracks. Remove anything that might make you trip as you walk through a door, such as a raised step or threshold. Trim any bushes or trees on the  path to your home. Use bright outdoor lighting. Clear any walking paths of anything that might make someone trip, such as rocks or tools. Regularly check to see if handrails are loose or broken. Make sure that both sides of any steps have handrails. Any raised decks and porches should have guardrails on the edges. Have any leaves, snow, or ice cleared regularly. Use sand or salt on walking paths during winter. Clean up any spills in your garage right away. This includes oil or grease spills. What can I do in the bathroom? Use night lights. Install grab bars by the toilet and in the tub and shower. Do not use towel bars as grab bars. Use non-skid mats or decals in the tub or shower. If you need to sit down in the shower, use a plastic, non-slip stool. Keep the floor dry. Clean up any water that spills on the floor as soon as it happens. Remove soap buildup in the tub or shower regularly. Attach bath mats securely with double-sided non-slip rug tape. Do  not have throw rugs and other things on the floor that can make you trip. What can I do in the bedroom? Use night lights. Make sure that you have a light by your bed that is easy to reach. Do not use any sheets or blankets that are too big for your bed. They should not hang down onto the floor. Have a firm chair that has side arms. You can use this for support while you get dressed. Do not have throw rugs and other things on the floor that can make you trip. What can I do in the kitchen? Clean up any spills right away. Avoid walking on wet floors. Keep items that you use a lot in easy-to-reach places. If you need to reach something above you, use a strong step stool that has a grab bar. Keep electrical cords out of the way. Do not use floor polish or wax that makes floors slippery. If you must use wax, use non-skid floor wax. Do not have throw rugs and other things on the floor that can make you trip. What can I do with my stairs? Do not  leave any items on the stairs. Make sure that there are handrails on both sides of the stairs and use them. Fix handrails that are broken or loose. Make sure that handrails are as long as the stairways. Check any carpeting to make sure that it is firmly attached to the stairs. Fix any carpet that is loose or worn. Avoid having throw rugs at the top or bottom of the stairs. If you do have throw rugs, attach them to the floor with carpet tape. Make sure that you have a light switch at the top of the stairs and the bottom of the stairs. If you do not have them, ask someone to add them for you. What else can I do to help prevent falls? Wear shoes that: Do not have high heels. Have rubber bottoms. Are comfortable and fit you well. Are closed at the toe. Do not wear sandals. If you use a stepladder: Make sure that it is fully opened. Do not climb a closed stepladder. Make sure that both sides of the stepladder are locked into place. Ask someone to hold it for you, if possible. Clearly mark and make sure that you can see: Any grab bars or handrails. First and last steps. Where the edge of each step is. Use tools that help you move around (mobility aids) if they are needed. These include: Canes. Walkers. Scooters. Crutches. Turn on the lights when you go into a dark area. Replace any light bulbs as soon as they burn out. Set up your furniture so you have a clear path. Avoid moving your furniture around. If any of your floors are uneven, fix them. If there are any pets around you, be aware of where they are. Review your medicines with your doctor. Some medicines can make you feel dizzy. This can increase your chance of falling. Ask your doctor what other things that you can do to help prevent falls. This information is not intended to replace advice given to you by your health care provider. Make sure you discuss any questions you have with your health care provider. Document Released:  12/30/2008 Document Revised: 08/11/2015 Document Reviewed: 04/09/2014 Elsevier Interactive Patient Education  2017 [employer].

## 2022-05-17 DIAGNOSIS — F331 Major depressive disorder, recurrent, moderate: Secondary | ICD-10-CM | POA: Diagnosis not present

## 2022-05-17 DIAGNOSIS — F900 Attention-deficit hyperactivity disorder, predominantly inattentive type: Secondary | ICD-10-CM | POA: Diagnosis not present

## 2022-05-31 DIAGNOSIS — M48061 Spinal stenosis, lumbar region without neurogenic claudication: Secondary | ICD-10-CM | POA: Diagnosis not present

## 2022-05-31 DIAGNOSIS — M5416 Radiculopathy, lumbar region: Secondary | ICD-10-CM | POA: Diagnosis not present

## 2022-05-31 DIAGNOSIS — M5136 Other intervertebral disc degeneration, lumbar region: Secondary | ICD-10-CM | POA: Diagnosis not present

## 2022-06-04 ENCOUNTER — Telehealth: Payer: Self-pay | Admitting: Internal Medicine

## 2022-06-04 NOTE — Telephone Encounter (Signed)
Pt called wanting to have Tammy give him a call when she is available to go over some vitamins he is looking to start. Pt just wanted to make sure they would not clash with what meds he is currently taking.

## 2022-06-05 ENCOUNTER — Encounter: Payer: Self-pay | Admitting: Pharmacist

## 2022-06-05 DIAGNOSIS — M5416 Radiculopathy, lumbar region: Secondary | ICD-10-CM | POA: Diagnosis not present

## 2022-06-05 NOTE — Telephone Encounter (Signed)
Patient would like to start taking Olly vitamin - Ultra Strength Goodbye Stress and Extra Strength Brain Benefit Blend.   Researched ingredients inUltra Strength Goodbye Stress:  125mg  Ashwagandha - use for arthritis, stress and anxiety. No known drug interactions currently. Potential adverse reactions - GI side effects (nausea, diarrhea are rare). Precaution listed to monitor liver and kidney function.  100mg  GABA - Gamma aminobutyric acid - naturally occurs in human body, used for depression and anxiety. No know drug interactions. Low side effects risk - recommended to stop if experience vision changes, or increase in HR, blood pressure or anxiety.  100mg  L - Theanine - amino acid found in green tea and some mushrooms. May help with brain function and stress. Possible mild side effects of drowsiness or headache. No drug interactions but should monitor for drowsiness when taken with other medications that can cause drowsiness.  75mg  Lemon Balm - uses as a digestive aid and for insomnia.  Could affect thyroid medication but patient is not taking thyroid medications currently.    Extra Strength Brain Benefit Blend:  2mg  vitamin B6 50 mcg Vitamin B12 0.4mg  iron 200mg  blueberry extract - antioxidant. Could help with brain function. No side effects. Could affect metabolism of buspirone (not a medication patient is taking)  150mg  bacosides (Bacopa Extract) - thought to help with inflammation, stress and brain function. Possible drug interaction with amitriptyline (patient is not on this medication). Possible side effects - GI issues - nausea, diarrhea 100mg  omega 3's  Since patient is taking vitamin B6 and B12 in his B Complex vitamin he might consider taking either the B Complex or Extra Strength Brain Benefit Blend. Though there is only a small amount of B6 and B12 in the Extra Strength Brain Benefit Blend.

## 2022-06-08 ENCOUNTER — Other Ambulatory Visit: Payer: Self-pay | Admitting: Internal Medicine

## 2022-06-19 DIAGNOSIS — M5416 Radiculopathy, lumbar region: Secondary | ICD-10-CM | POA: Diagnosis not present

## 2022-06-24 ENCOUNTER — Other Ambulatory Visit: Payer: Self-pay | Admitting: Internal Medicine

## 2022-06-24 DIAGNOSIS — I10 Essential (primary) hypertension: Secondary | ICD-10-CM

## 2022-06-28 ENCOUNTER — Encounter: Payer: Self-pay | Admitting: Internal Medicine

## 2022-07-18 ENCOUNTER — Ambulatory Visit: Payer: Medicare (Managed Care) | Admitting: Internal Medicine

## 2022-07-18 ENCOUNTER — Encounter: Payer: Self-pay | Admitting: Internal Medicine

## 2022-07-18 VITALS — BP 128/70 | HR 59 | Temp 98.1°F | Resp 18 | Ht 71.0 in | Wt 237.0 lb

## 2022-07-18 DIAGNOSIS — J45991 Cough variant asthma: Secondary | ICD-10-CM

## 2022-07-18 DIAGNOSIS — R739 Hyperglycemia, unspecified: Secondary | ICD-10-CM

## 2022-07-18 DIAGNOSIS — I10 Essential (primary) hypertension: Secondary | ICD-10-CM | POA: Diagnosis not present

## 2022-07-18 DIAGNOSIS — R6889 Other general symptoms and signs: Secondary | ICD-10-CM

## 2022-07-18 LAB — BASIC METABOLIC PANEL
BUN: 21 mg/dL (ref 6–23)
CO2: 30 mEq/L (ref 19–32)
Calcium: 9.2 mg/dL (ref 8.4–10.5)
Chloride: 98 mEq/L (ref 96–112)
Creatinine, Ser: 1.22 mg/dL (ref 0.40–1.50)
GFR: 58.49 mL/min — ABNORMAL LOW (ref >60.00)
Glucose, Bld: 112 mg/dL — ABNORMAL HIGH (ref 70–99)
Potassium: 3.8 mEq/L (ref 3.5–5.1)
Sodium: 137 mEq/L (ref 135–145)

## 2022-07-18 LAB — HEMOGLOBIN A1C: Hgb A1c MFr Bld: 6 % (ref 4.6–6.5)

## 2022-07-18 MED ORDER — BENZONATATE 100 MG PO CAPS: 100.0000 mg | ORAL_CAPSULE | Freq: Three times a day (TID) | ORAL | 2 refills | Status: DC | PRN

## 2022-07-18 NOTE — Progress Notes (Signed)
Subjective:    Patient ID: Kirk Sutton, male    DOB: [birth year], 72 y.o.   MRN: [id number]  DOS:  07/18/2022 Type of visit - description: rov  Since the last office visit, other than pain he is doing well Continue with low back pain, doing a lot of stretching. That seems to help. Asthma is well-controlled. Ambulatory BPs normal.   Review of Systems See above   Past Medical History:  Diagnosis Date   Allergic rhinitis    Asthma    Chronic prostatitis    sees urology   Diabetes mellitus    type 11 (a1c 6.0 03/2008)/no meds.   Hernia, umbilical    Hyperlipidemia    Hypertension    Psoriasis    @ hands, sees derm    Past Surgical History:  Procedure Laterality Date   APPENDECTOMY     FERTILITY SURGERY  03/19/1977   Spermatic Vein Ligation   HERNIA REPAIR     1950s   NASAL SEPTUM SURGERY  03/20/2007   SHOULDER SURGERY  03/19/2006   left   TESTICLE SURGERY     undescended repair    UMBILICAL HERNIA REPAIR  08/28/2021   VASECTOMY      Current Outpatient Medications  Medication Instructions   acetaminophen (TYLENOL) 650 mg, Oral, Every 8 hours PRN   albuterol (VENTOLIN HFA) 108 (90 Base) MCG/ACT inhaler 2 puffs, Inhalation, Every 6 hours PRN   amphetamine-dextroamphetamine (ADDERALL) 15 MG tablet 15 mg, Oral, 2 times daily, In morning and at noon<BR>   atorvastatin (LIPITOR) 20 mg, Oral, Daily at bedtime   azelastine (ASTELIN) 0.1 % nasal spray 1 spray, Each Nare, Every evening   benzonatate (TESSALON) 100-200 mg, Oral, 3 times daily PRN   clobetasol ointment (TEMOVATE) 0.05 % 1 application , Topical, 2 times daily PRN, For hands    Coenzyme Q10 100 mg, Oral, Daily   cycloSPORINE (RESTASIS) 0.05 % ophthalmic emulsion 1 drop, 2 times daily   desloratadine (CLARINEX) 10 mg, Oral, Daily   dextromethorphan-guaiFENesin (MUCINEX DM) 30-600 MG 12hr tablet 1 tablet, Oral, 2 times daily PRN   FLUoxetine (PROZAC) 40 MG capsule Take 1 tablet daily   Fluticasone Furoate  (FLONASE SENSIMIST NA) Nasal, Daily PRN   fluticasone-salmeterol (ADVAIR HFA) 115-21 MCG/ACT inhaler 2 puffs, Inhalation, 2 times daily   hydrochlorothiazide (HYDRODIURIL) 25 mg, Oral, Daily   ibuprofen (ADVIL) 200 mg, Oral, Every 6 hours PRN   Magnesium Gluconate 400 mg, Oral, Daily   metoprolol succinate (TOPROL-XL) 50 mg, Oral, Daily, Take with or immediately following a meal   montelukast (SINGULAIR) 10 MG tablet TAKE 1 TABLET(10 MG) BY MOUTH AT BEDTIME   OVER THE COUNTER MEDICATION 1 each, Oral, Daily PRN, CBD Gummy - for thumb / hand pain   pyridOXINE (VITAMIN B6) 400 mg, Oral, Daily, Super B complex   terazosin (HYTRIN) 3 mg, Oral, Daily   Turmeric 1,000 mg, Oral, Daily   vitamin C 1,000 mg, Oral, Daily   Vitamin D, Cholecalciferol, 25 MCG (1000 UT) CAPS 1 capsule, Oral, Daily       Objective:   Physical Exam BP 128/70   Pulse (!) 59   Temp 98.1 F (36.7 C) (Oral)   Resp 18   Ht 5\' 11"  (1.803 m)   Wt 237 lb (107.5 kg)   SpO2 95%   BMI 33.05 kg/m  General:   Well developed, NAD, BMI noted. HEENT:  Normocephalic . Face symmetric, atraumatic Lungs:  CTA B Normal respiratory effort, no  intercostal retractions, no accessory muscle use. Heart: RRR,  no murmur.  Lower extremities: no pretibial edema bilaterally  Skin: Not pale. Not jaundice Neurologic:  alert & oriented X3.  Speech normal, gait appropriate for age and unassisted Psych--  Cognition and judgment appear intact.  Cooperative with normal attention span and concentration.  Behavior appropriate. No anxious or depressed appearing.      Assessment     Assessment Prediabetes  HTN Hyperlipidemia Anxiety Dr Darlys Gales (psych)   Cough variant asthma, atopic dermatitis, allergic rhinitis and allergic conjunctivitis.Dr Lorenso Courier MSK:  --Chronic back pain, pain meds prn --DJD- hands Chronic prostatitis, sees urology Psoriasis, hands, sees dermatology Snoring (severe per pt)- dentist Rx a moth guard ~ 08-2015,  helps Negative a stress test 09-2021  PLAN Prediabetes: Despite pain he remains active, check A1c HTN: Ambulatory BPs normal.  Continue HCTZ, metoprolol.  Also terazosin.  Check BMP. Asthma, cough variant: Since the last visit saw the allergist, oral symptoms are controlled, request Tessalon Perles refilled.  Will do. Chronic back pain: Still an issue, reports he only uses Tylenol/Advil.  No narcotics.  Stretching is helping. Vaccine advice provided, see AVS RTC 10-20204 for CPX   01/11/2022  3:45 PM)  Here for CPX Prediabetes: Check A1c HTN: BP very good today, recommend to monitor at home, continue HCTZ, metoprolol, terazosin.  Checking labs Hyperlipidemia: On atorvastatin.  Checking labs Episodes of cold sweats:See last visit,  stress test was ordered, low risk. No further sxs Cough variant asthma: Continue inhalers and Tessalon Perles, RF as needed. Snoring: At this point reports snoring is minimal, energy level is good, not falling asleep easily.  Foot pain: See HPI, observation.  Exam is normal. RTC 6 months         URI: Strep and COVID test negative. Rx conservative treatment.  Rest, fluids, Tylenol, Robitussin DM, Tessalon Perles which he has taken before successfully.  Also nasal sprays. See AVS. If not better he will call, might need antibiotics.

## 2022-07-18 NOTE — Patient Instructions (Addendum)
Vaccines I recommend: Covid booster RSV vaccine  Please bring [place] a copy of your Healthcare Power of Attorney for your chart.   Continue checking your blood pressure regularly BP GOAL is between 110/65 and  135/85. If it is consistently higher or lower, let me know    GO TO THE LAB : Get the blood work     GO TO THE FRONT DESK, PLEASE SCHEDULE YOUR APPOINTMENTS Come back for   a physical exam by 12-2022

## 2022-07-19 NOTE — Assessment & Plan Note (Signed)
Prediabetes: Despite pain he remains active, check A1c HTN: Ambulatory BPs normal.  Continue HCTZ, metoprolol.  Also terazosin.  Check BMP. Asthma, cough variant: Since the last visit saw the allergist, sxs are controlled, request Tessalon Perles refilled.  Will do. Chronic back pain: Still an issue, reports he only uses Tylenol/Advil.  No narcotics.  Stretching is helping. Vaccine advice provided, see AVS RTC 10-20204 for CPX

## 2022-08-15 DIAGNOSIS — F331 Major depressive disorder, recurrent, moderate: Secondary | ICD-10-CM | POA: Diagnosis not present

## 2022-08-15 DIAGNOSIS — F900 Attention-deficit hyperactivity disorder, predominantly inattentive type: Secondary | ICD-10-CM | POA: Diagnosis not present

## 2022-08-29 DIAGNOSIS — M1711 Unilateral primary osteoarthritis, right knee: Secondary | ICD-10-CM | POA: Diagnosis not present

## 2022-08-30 ENCOUNTER — Telehealth: Payer: Medicare (Managed Care)

## 2022-09-04 DIAGNOSIS — L738 Other specified follicular disorders: Secondary | ICD-10-CM | POA: Diagnosis not present

## 2022-09-04 DIAGNOSIS — L821 Other seborrheic keratosis: Secondary | ICD-10-CM | POA: Diagnosis not present

## 2022-09-04 DIAGNOSIS — L820 Inflamed seborrheic keratosis: Secondary | ICD-10-CM | POA: Diagnosis not present

## 2022-09-04 DIAGNOSIS — Z85828 Personal history of other malignant neoplasm of skin: Secondary | ICD-10-CM | POA: Diagnosis not present

## 2022-09-04 DIAGNOSIS — L570 Actinic keratosis: Secondary | ICD-10-CM | POA: Diagnosis not present

## 2022-09-04 DIAGNOSIS — D239 Other benign neoplasm of skin, unspecified: Secondary | ICD-10-CM | POA: Diagnosis not present

## 2022-09-04 DIAGNOSIS — D1801 Hemangioma of skin and subcutaneous tissue: Secondary | ICD-10-CM | POA: Diagnosis not present

## 2022-09-06 ENCOUNTER — Other Ambulatory Visit: Payer: Medicare (Managed Care) | Admitting: Pharmacist

## 2022-09-06 DIAGNOSIS — I10 Essential (primary) hypertension: Secondary | ICD-10-CM

## 2022-09-06 DIAGNOSIS — J45909 Unspecified asthma, uncomplicated: Secondary | ICD-10-CM

## 2022-09-06 DIAGNOSIS — E785 Hyperlipidemia, unspecified: Secondary | ICD-10-CM

## 2022-09-06 NOTE — Progress Notes (Signed)
Pharmacy Note  09/06/2022 Name: Kirk Sutton MRN: [id number] DOB: 01-08-1951  Subjective: Kirk Sutton is a 72 y.o. year old male who is a primary care patient of Wanda Plump, MD. Clinical Pharmacist Practitioner referral was placed to assist with medication management.    Engaged with patient by telephone for follow up visit today.  Patient states he is feeling the best he has in awhile both physically and mentally. He has been going twice a week [facility] and this has helped tremendously with his sciatica.  Has recently seen dermatologist - per patient he was told he had a staph infection on his skin.   Anxiety and ADHD:  Followed by Dr Betti Cruz.  Current therapy - Adderall 15mg  twice a day (patient usually only take once a day but can take 2nd dose if needed) and fluoxetine 40 mg daily)  Hypertension:  Current therapy: hydrochlorothiazide 25mg  each morning and metoprolol Er 50mg  daily   Does not check blood pressure at home but he does check HR and o2 sats daily since episode of bradycardia 09/2021.  BP Readings from Last 3 Encounters:  07/18/22 128/70  05/04/22 121/77  04/04/22 136/72     Diet:  Limits sodium He has been eating more vegetables and fruits; trying to keep serving sizes small.  He has lost weight recently with diet changes - 238# to 231# per home scales.   Allergies -  seeing Dr Barnetta Chapel.  Current therapy: fluticasone/salmeterol 115/72mcg - 2 puffs twice a day And albuterol inhaler as needed for shortness of breath  He reports he has occasional cough and uses either Mucinex or benzonatate.   Health Maintenance:  referral to GI for colonoscopy was sent 02/2022. (looks like he is past due to have colonoscopy). Last colonoscopy was 2018 and per HM was to be rechecked in 3 years but I could not find note from GI about when they recommended repeat colonoscopy.   Hyperlipidemia:  Currently therapy - atorvastatin 20mg  daily Denies myalgias Takes daily  CoEnzyme Q10  Objective: Review of patient status, including review of consultants reports, laboratory and other test data, was performed as part of comprehensive.  Lab Results  Component Value Date   CREATININE 1.22 07/18/2022   CREATININE 1.18 01/10/2022   CREATININE 1.17 06/19/2021    Lab Results  Component Value Date   HGBA1C 6.0 07/18/2022       Component Value Date/Time   CHOL 87 01/10/2022 1518   TRIG 83.0 01/10/2022 1518   TRIG 52 02/20/2006 1053   HDL 40.80 01/10/2022 1518   CHOLHDL 2 01/10/2022 1518   VLDL 16.6 01/10/2022 1518   LDLCALC 29 01/10/2022 1518   LDLCALC 83 12/23/2019 0828     Clinical ASCVD: No  The ASCVD Risk score (Arnett DK, et al., 2019) failed to calculate for the following reasons:   The valid total cholesterol range is 130 to 320 mg/dL    BP Readings from Last 3 Encounters:  07/18/22 128/70  05/04/22 121/77  04/04/22 136/72     Allergies  Allergen Reactions   Cetirizine Hcl     REACTION: prostatitis   Fexofenadine     REACTION: causes prostatitis    Medications Reviewed Today     Reviewed by Wanda Plump, MD (Physician) on 07/18/22 at 0809  Med List Status: <None>   Medication Order Taking? Sig Documenting Provider Last Dose Status Informant  acetaminophen (TYLENOL) 650 MG CR tablet [id number] Yes Take 650 mg by  mouth every 8 (eight) hours as needed for pain. [provider] Taking Active            Med Note (CANTER, [facility] D   Tue Oct 03, 2021  9:58 AM) PRN  albuterol (VENTOLIN HFA) 108 (90 Base) MCG/ACT inhaler [id number] Yes Inhale 2 puffs into the lungs every 6 (six) hours as needed for wheezing or shortness of breath. Saguier, Ramon Dredge, PA-C Taking Active   amphetamine-dextroamphetamine (ADDERALL) 15 MG tablet [id number] Yes Take 15 mg by mouth in the morning and at bedtime. In morning and at noon [provider] Taking Active            Med Note (CANTER, Paula Libra   Tue Dec 20, 2020  7:56 AM)    Ascorbic Acid  (VITAMIN C) 1000 MG tablet [id number] Yes Take 1,000 mg by mouth daily. [provider] Taking Active   atorvastatin (LIPITOR) 20 MG tablet [id number] Yes Take 1 tablet (20 mg total) by mouth at bedtime. Wanda Plump, MD Taking Active   azelastine (ASTELIN) 0.1 % nasal spray [id number] Yes Place 1 spray into both nostrils every evening. [provider] Taking Active            Med Note Clydie Braun, Oree Hislop B   Thu Feb 22, 2022  9:51 AM) Uses in the evening  benzonatate (TESSALON) 100 MG capsule [id number] Yes Take 1-2 capsules (100-200 mg total) by mouth 3 (three) times daily as needed for cough. Wanda Plump, MD Taking Active            Med Note (CANTER, Vicente Males D   Wed Jul 18, 2022  7:58 AM) Needs refill  clobetasol ointment (TEMOVATE) 0.05 % [id number] Yes Apply 1 application topically 2 (two) times daily as needed. For hands [provider] Taking Active            Med Note (CANTER, [facility] D   Tue Oct 03, 2021  9:59 AM) PRN  Coenzyme Q10 100 MG capsule [id number] Yes Take 100 mg by mouth daily. [provider] Taking Active   cycloSPORINE (RESTASIS) 0.05 % ophthalmic emulsion [id number] Yes 1 drop 2 (two) times daily. [provider] Taking Active   desloratadine (CLARINEX) 5 MG tablet [id number] Yes Take 10 mg by mouth daily. [provider] Taking Active   dextromethorphan-guaiFENesin [facility] DM) 30-600 MG 12hr tablet [id number] Yes Take 1 tablet by mouth 2 (two) times daily as needed for cough. [provider] Taking Active            Med Note (CANTER, Paula Libra   Wed Jan 10, 2022  2:52 PM) PRN  FLUoxetine (PROZAC) 40 MG capsule [id number] Yes Take 1 tablet daily [provider] Taking Active            Med Note Loreli Dollar, Francene Castle Apr 12, 2015  2:47 PM)    Fluticasone Linton Rump [facility]) [id number] Yes Place into the nose daily as needed. [provider] Taking Active Self  fluticasone-salmeterol (ADVAIR HFA)  762-83 MCG/ACT inhaler [id number] Yes Inhale 2 puffs into the lungs in the morning and at bedtime. [provider] Taking Active   hydrochlorothiazide (HYDRODIURIL) 25 MG tablet [id number] Yes Take 1 tablet (25 mg total) by mouth daily. Wanda Plump, MD Taking Active   ibuprofen (ADVIL) 200 MG tablet [id number] Yes Take 200 mg by mouth every 6 (six) hours as needed. [provider] Taking Active  Med Note (CANTER, Paula Libra   Wed Jul 18, 2022  7:57 AM) PRN  Magnesium Gluconate 250 MG TABS [id number] Yes Take 400 mg by mouth daily. [provider] Taking Active   metoprolol succinate (TOPROL-XL) 50 MG 24 hr tablet [id number] Yes Take 1 tablet (50 mg total) by mouth daily. Take with or immediately following a meal Wanda Plump, MD Taking Active   montelukast (SINGULAIR) 10 MG tablet [id number] Yes TAKE 1 TABLET(10 MG) BY MOUTH AT BEDTIME Wanda Plump, MD Taking Active   OVER THE COUNTER MEDICATION [id number] Yes Take 1 each by mouth daily as needed. CBD Gummy - for thumb / hand pain [provider] Taking Active            Med Note (CANTER, KAYLYN D   Tue Oct 03, 2021 10:02 AM) Every morning  pyridOXINE (VITAMIN B-6) 100 MG tablet [id number] Yes Take 400 mg by mouth daily. Super B complex [provider] Taking Active Self  terazosin (HYTRIN) 1 MG capsule [id number] Yes Take 3 capsules (3 mg total) by mouth daily. Wanda Plump, MD Taking Active   Turmeric 500 MG CAPS [id number] Yes Take 1,000 mg by mouth daily. [provider] Taking Active   Vitamin D, Cholecalciferol, 25 MCG (1000 UT) CAPS [id number] Yes Take 1 capsule by mouth daily. [provider] Taking Active             Patient Active Problem List   Diagnosis Date Noted   Incarcerated umbilical hernia 06/22/2021   Cough variant asthma 06/19/2021   Flu-like symptoms 12/23/2020   Chronic back pain 04/30/2018   DJD (degenerative joint disease) 05/22/2017   Snoring 04/03/2016    PCP NOTES >>>>> 02/24/2015   Pain managment 03/08/2014   Anxiety state 07/15/2012   Annual physical exam 07/20/2010   Hyperglycemia 08/11/2008   PROSTATITIS, CHRONIC 03/31/2008   Dyslipidemia 10/13/2007   ALLERGIC RHINITIS 10/17/2006   Essential hypertension 04/09/2006   Extrinsic asthma 04/09/2006     Medication Assistance:  None required.  Patient affirms current coverage meets needs.   Assessment / Plan: Hypertension Continue metoprolol ER 50mg  daily and hydrochlorothiazide 25mg  daily.  Continue to check HR 2 to 3 times per week or if he has symptoms of low HR (fatigue, lightheadedness / dizziness or sweating)  Continue to limit sodium intake and other recently dietary changes. Commended patient on recent weight loss of 7lbs.  Anxiety and ADHD:  Continue to followed up with Dr Betti Cruz.  Continue current therapy - Adderall 15mg  twice a day (patient usually only take once a day but can take 2nd dose if needed) and fluoxetine 40 mg daily)  Allergies -  Continue to follow up with  Dr Barnetta Chapel.  Continue-  fluticasone/salmeterol 115/65mcg - 2 puffs twice a day and albuterol inhaler as needed for shortness of breath   Hyperlipidemia:  Continue atorvastatin 20mg  daily Due to recheck lipids at appt in October with PCP  Medication management:  Reviewed and updated medication list Reviewed refill history and adherence  Adherence for 2023 for statin improved to 100%. In 2022 was only 64%  Health Maintenance:  There is an order for colonoscopy - patient is planning to call for appointment soon.   Follow Up:  as needed - patient to call if he has any medication related questions or concerns.   Henrene Pastor, PharmD Clinical Pharmacist [facility]  - [facility] [place] [phone]

## 2022-09-25 ENCOUNTER — Ambulatory Visit (INDEPENDENT_AMBULATORY_CARE_PROVIDER_SITE_OTHER): Payer: Medicare (Managed Care) | Admitting: Family

## 2022-09-25 VITALS — BP 128/64 | HR 60 | Temp 97.8°F | Resp 18 | Ht 71.0 in | Wt 239.4 lb

## 2022-09-25 DIAGNOSIS — W57XXXA Bitten or stung by nonvenomous insect and other nonvenomous arthropods, initial encounter: Secondary | ICD-10-CM | POA: Diagnosis not present

## 2022-09-25 DIAGNOSIS — M545 Low back pain, unspecified: Secondary | ICD-10-CM

## 2022-09-25 DIAGNOSIS — S70362A Insect bite (nonvenomous), left thigh, initial encounter: Secondary | ICD-10-CM

## 2022-09-25 DIAGNOSIS — G8929 Other chronic pain: Secondary | ICD-10-CM | POA: Diagnosis not present

## 2022-09-25 DIAGNOSIS — I10 Essential (primary) hypertension: Secondary | ICD-10-CM

## 2022-09-25 NOTE — Assessment & Plan Note (Signed)
Initial bp was elevated today.  Repeat bp was WNL.  Continue metoprolol and hydrochlorothiazide per pcp.

## 2022-09-25 NOTE — Progress Notes (Signed)
Subjective:     Patient ID: Kirk Sutton, male    DOB: 1950-04-16, 72 y.o.   MRN: [id number]  Chief Complaint  Patient presents with   Tick Removal    Concerns/ questions:  Last Friday evening. Inner left thigh. Has noticed some streaking and a bullseye. He has been using some Bacitrim for itching.     HPI  Discussed the use of AI scribe software for clinical note transcription with the patient, who gave verbal consent to proceed.  History of Present Illness   The patient presents for evaluation of a tick bite that occurred last Friday. He reports that the tick was attached for a prolonged period before he was able to remove it. He has not experienced any fever, ras or systemic symptoms since the bite. He has been applying Vaseline to the area. The patient also mentions that he has been attending a [facility] for sciatica relief, which has been beneficial. He has a history of hypertension, which is well-managed under the care of Dr. Drue Novel. He also has a history of an umbilical hernia, which was repaired last summer.          Health Maintenance Due  Topic Date Due   Zoster Vaccines- Shingrix (1 of 2) Never done   Colonoscopy  02/19/2020    Past Medical History:  Diagnosis Date   Allergic rhinitis    Asthma    Chronic prostatitis    sees urology   Diabetes mellitus    type 11 (a1c 6.0 03/2008)/no meds.   Hernia, umbilical    Hyperlipidemia    Hypertension    Psoriasis    @ hands, sees derm    Past Surgical History:  Procedure Laterality Date   APPENDECTOMY     FERTILITY SURGERY  03/19/1977   Spermatic Vein Ligation   HERNIA REPAIR     1950s   NASAL SEPTUM SURGERY  03/20/2007   SHOULDER SURGERY  03/19/2006   left   TESTICLE SURGERY     undescended repair    UMBILICAL HERNIA REPAIR  08/28/2021   VASECTOMY      Family History  Problem Relation Age of Onset   Diabetes Sister    Hypertension Mother    Colon polyps Mother    Hypertension Sister    Heart  attack Father        x 3 onset age 32, died at 41   Colon polyps Father    Cancer Father        bladder   Colon cancer Neg Hx    Prostate cancer Neg Hx    Stomach cancer Neg Hx     Social History   Socioeconomic History   Marital status: Married    Spouse name: Not on file   Number of children: 2   Years of education: Not on file   Highest education level: Not on file  Occupational History   Occupation: Diplomatic Services operational officer  Tobacco Use   Smoking status: Never   Smokeless tobacco: Never  Vaping Use   Vaping Use: Never used  Substance and Sexual Activity   Alcohol use: Yes    Comment: socially, rare   Drug use: No   Sexual activity: Yes  Other Topics Concern   Not on file  Social History Narrative   Household-- pt, wife    Social Determinants of Health   Financial Resource Strain: Low Risk  (04/20/2021)   Overall Financial Resource Strain (CARDIA)  Difficulty of Paying Living Expenses: Not hard at all  Food Insecurity: No Food Insecurity (05/04/2022)   Hunger Vital Sign    Worried About Running Out of Food in the Last Year: Never true    Ran Out of Food in the Last Year: Never true  Transportation Needs: No Transportation Needs (05/04/2022)   PRAPARE - Administrator, Civil Service (Medical): No    Lack of Transportation (Non-Medical): No  Physical Activity: Sufficiently Active (02/22/2022)   Exercise Vital Sign    Days of Exercise per Week: 5 days    Minutes of Exercise per Session: 30 min  Stress: No Stress Concern Present (04/20/2021)   [employer] of Occupational Health - Occupational Stress Questionnaire    Feeling of Stress : Not at all  Social Connections: Socially Integrated (04/20/2021)   Social Connection and Isolation Panel [NHANES]    Frequency of Communication with Friends and Family: More than three times a week    Frequency of Social Gatherings with Friends and Family: More than three times a week    Attends  Religious Services: More than 4 times per year    Active Member of [employer] or Organizations: Yes    Attends Banker Meetings: 1 to 4 times per year    Marital Status: Married  Catering manager Violence: Not At Risk (05/04/2022)   Humiliation, Afraid, Rape, and Kick questionnaire    Fear of Current or Ex-Partner: No    Emotionally Abused: No    Physically Abused: No    Sexually Abused: No    Outpatient Medications Prior to Visit  Medication Sig Dispense Refill   acetaminophen (TYLENOL) 650 MG CR tablet Take 650 mg by mouth every 8 (eight) hours as needed for pain.     albuterol (VENTOLIN HFA) 108 (90 Base) MCG/ACT inhaler Inhale 2 puffs into the lungs every 6 (six) hours as needed for wheezing or shortness of breath. 18 g 5   amphetamine-dextroamphetamine (ADDERALL) 15 MG tablet Take 15 mg by mouth in the morning and at bedtime. In morning and at noon     Ascorbic Acid (VITAMIN C) 1000 MG tablet Take 1,000 mg by mouth daily.     atorvastatin (LIPITOR) 20 MG tablet Take 1 tablet (20 mg total) by mouth at bedtime. 90 tablet 1   clobetasol ointment (TEMOVATE) 0.05 % Apply 1 application topically 2 (two) times daily as needed. For hands     Coenzyme Q10 100 MG capsule Take 100 mg by mouth daily.     cycloSPORINE (RESTASIS) 0.05 % ophthalmic emulsion 1 drop 2 (two) times daily.     desloratadine (CLARINEX) 5 MG tablet Take 10 mg by mouth daily.     FLUoxetine (PROZAC) 40 MG capsule Take 1 tablet daily  2   Fluticasone Furoate (FLONASE SENSIMIST NA) Place into the nose daily as needed.     fluticasone-salmeterol (ADVAIR HFA) 115-21 MCG/ACT inhaler Inhale 2 puffs into the lungs in the morning and at bedtime.     hydrochlorothiazide (HYDRODIURIL) 25 MG tablet Take 1 tablet (25 mg total) by mouth daily. 90 tablet 1   ibuprofen (ADVIL) 200 MG tablet Take 200 mg by mouth every 6 (six) hours as needed.     Magnesium Gluconate 250 MG TABS Take 400 mg by mouth daily.     metoprolol succinate  (TOPROL-XL) 50 MG 24 hr tablet Take 1 tablet (50 mg total) by mouth daily. Take with or immediately following a meal 90 tablet 1  montelukast (SINGULAIR) 10 MG tablet TAKE 1 TABLET(10 MG) BY MOUTH AT BEDTIME 90 tablet 1   mupirocin ointment (BACTROBAN) 2 % Apply 1 Application topically 2 (two) times daily.     Psyllium (METAMUCIL PO) Take 90 mg by mouth daily.     pyridOXINE (VITAMIN B-6) 100 MG tablet Take 400 mg by mouth daily. Super B complex     terazosin (HYTRIN) 1 MG capsule Take 3 capsules (3 mg total) by mouth daily. 270 capsule 1   Turmeric 500 MG CAPS Take 1,000 mg by mouth daily.     Vitamin D, Cholecalciferol, 25 MCG (1000 UT) CAPS Take 1 capsule by mouth daily.     dextromethorphan-guaiFENesin (MUCINEX DM) 30-600 MG 12hr tablet Take 1 tablet by mouth 2 (two) times daily as needed for cough.     doxycycline (VIBRAMYCIN) 100 MG capsule Take 100 mg by mouth 2 (two) times daily. (Patient not taking: Reported on 09/25/2022)     No facility-administered medications prior to visit.    Allergies  Allergen Reactions   Cetirizine Hcl     REACTION: prostatitis   Fexofenadine     REACTION: causes prostatitis    ROS See  HPI    Objective:    Physical Exam Constitutional:      Appearance: Normal appearance.  HENT:     Head: Normocephalic and atraumatic.  Skin:    General: Skin is warm and dry.     Comments: Small area of irritation at site of tick bite, no bulls eye, no rash noted  Neurological:     Mental Status: He is alert and oriented to person, place, and time.  Psychiatric:        Mood and Affect: Mood normal.        Behavior: Behavior normal.        Thought Content: Thought content normal.        Judgment: Judgment normal.       BP 128/64   Pulse 60   Temp 97.8 F (36.6 C) (Oral)   Resp 18   Ht 5\' 11"  (1.803 m)   Wt 239 lb 6.4 oz (108.6 kg)   SpO2 100%   BMI 33.39 kg/m  Wt Readings from Last 3 Encounters:  09/25/22 239 lb 6.4 oz (108.6 kg)  07/18/22 237  lb (107.5 kg)  05/04/22 239 lb 6.4 oz (108.6 kg)       Assessment & Plan:   Problem List Items Addressed This Visit       Unprioritized   Tick bite of left thigh - Primary    Tick Bite: No signs of Lyme disease (bull's eye rash, fever, joint pain, fatigue). He does not meet criteria for antibiotic prophylaxis.  -Monitor for symptoms of Lyme disease including fever, spreading rash, new joint pain, and fatigue.       Essential hypertension    Initial bp was elevated today.  Repeat bp was WNL.  Continue metoprolol and hydrochlorothiazide per pcp.       Chronic back pain    Sciatic pain is improving with stretching exercises. Pt will continue same.        I have discontinued Brandt Loosen dextromethorphan-guaiFENesin and doxycycline. I am also having him maintain his FLUoxetine, clobetasol ointment, Fluticasone Furoate (FLONASE SENSIMIST NA), pyridOXINE, Turmeric, vitamin C, Magnesium Gluconate, desloratadine, amphetamine-dextroamphetamine, Advair HFA, Coenzyme Q10, Vitamin D (Cholecalciferol), acetaminophen, montelukast, cycloSPORINE, albuterol, hydrochlorothiazide, terazosin, atorvastatin, metoprolol succinate, ibuprofen, Psyllium (METAMUCIL PO), and mupirocin ointment.  No orders of the  defined types were placed in this encounter.

## 2022-09-25 NOTE — Patient Instructions (Signed)
VISIT SUMMARY:  During your visit, we discussed your recent tick bite, your well-managed hypertension, your past umbilical hernia repair, and your sciatica relief through stretching exercises. We also checked your blood pressure due to an initial high reading, which was likely due to anxiety. The repeat reading was normal.  YOUR PLAN:  -TICK BITE: You had a tick bite recently. Although there are no signs of Lyme disease or other tick borne illness, such as a bull's eye rash, fever, joint pain, or fatigue, it's important to monitor for these symptoms.  -HYPERTENSION: Your high blood pressure is well controlled with your current medication. Please continue taking your antihypertensive medications as prescribed by Dr. Drue Novel.  -SCIATICA: Kirk Sutton been experiencing relief from your sciatica symptoms through stretching exercises. Please continue with these exercises.  INSTRUCTIONS:  Please continue to monitor for any symptoms of Lyme disease following your recent tick bite. These symptoms can include a spreading rash, new joint pain, fever, and fatigue. If you notice any of these symptoms, please contact us immediately. Continue taking your hypertension medication as prescribed by Dr. Drue Novel, and keep up with your stretching exercises for sciatica relief.

## 2022-09-25 NOTE — Assessment & Plan Note (Addendum)
Tick Bite: No signs of Lyme disease (bull's eye rash, fever, joint pain, fatigue). He does not meet criteria for antibiotic prophylaxis.  -Monitor for symptoms of Lyme disease including fever, spreading rash, new joint pain, and fatigue.

## 2022-09-25 NOTE — Assessment & Plan Note (Signed)
Sciatic pain is improving with stretching exercises. Pt will continue same.

## 2022-10-02 ENCOUNTER — Other Ambulatory Visit: Payer: Self-pay | Admitting: Internal Medicine

## 2022-10-27 ENCOUNTER — Other Ambulatory Visit: Payer: Self-pay | Admitting: Internal Medicine

## 2022-10-29 ENCOUNTER — Other Ambulatory Visit: Payer: Self-pay

## 2022-10-29 MED ORDER — ATORVASTATIN CALCIUM 20 MG PO TABS: 20.0000 mg | ORAL_TABLET | Freq: Every day | ORAL | 1 refills | Status: DC

## 2022-11-08 DIAGNOSIS — M5416 Radiculopathy, lumbar region: Secondary | ICD-10-CM | POA: Diagnosis not present

## 2022-11-16 ENCOUNTER — Other Ambulatory Visit: Payer: Self-pay

## 2022-11-26 ENCOUNTER — Other Ambulatory Visit: Payer: Self-pay | Admitting: Internal Medicine

## 2022-11-26 DIAGNOSIS — M5416 Radiculopathy, lumbar region: Secondary | ICD-10-CM | POA: Diagnosis not present

## 2022-11-26 DIAGNOSIS — I10 Essential (primary) hypertension: Secondary | ICD-10-CM

## 2022-12-31 ENCOUNTER — Other Ambulatory Visit: Payer: Self-pay | Admitting: Internal Medicine

## 2023-01-01 DIAGNOSIS — L281 Prurigo nodularis: Secondary | ICD-10-CM | POA: Diagnosis not present

## 2023-01-09 DIAGNOSIS — M180 Bilateral primary osteoarthritis of first carpometacarpal joints: Secondary | ICD-10-CM | POA: Diagnosis not present

## 2023-01-15 ENCOUNTER — Encounter: Payer: Self-pay | Admitting: Internal Medicine

## 2023-01-15 ENCOUNTER — Ambulatory Visit (INDEPENDENT_AMBULATORY_CARE_PROVIDER_SITE_OTHER): Payer: Medicare (Managed Care) | Admitting: Internal Medicine

## 2023-01-15 ENCOUNTER — Ambulatory Visit (INDEPENDENT_AMBULATORY_CARE_PROVIDER_SITE_OTHER): Payer: Medicare (Managed Care)

## 2023-01-15 VITALS — BP 134/76 | HR 55 | Temp 97.9°F | Resp 18 | Ht 71.0 in | Wt 235.0 lb

## 2023-01-15 DIAGNOSIS — E785 Hyperlipidemia, unspecified: Secondary | ICD-10-CM

## 2023-01-15 DIAGNOSIS — R739 Hyperglycemia, unspecified: Secondary | ICD-10-CM

## 2023-01-15 DIAGNOSIS — Z23 Encounter for immunization: Secondary | ICD-10-CM

## 2023-01-15 DIAGNOSIS — Z0000 Encounter for general adult medical examination without abnormal findings: Secondary | ICD-10-CM

## 2023-01-15 DIAGNOSIS — Z91199 Patient's noncompliance with other medical treatment and regimen due to unspecified reason: Secondary | ICD-10-CM

## 2023-01-15 DIAGNOSIS — I10 Essential (primary) hypertension: Secondary | ICD-10-CM | POA: Diagnosis not present

## 2023-01-15 LAB — CBC WITH DIFFERENTIAL/PLATELET
Basophils Absolute: 0 10*3/uL (ref 0.0–0.1)
Basophils Relative: 0.5 % (ref 0.0–3.0)
Eosinophils Absolute: 0.3 10*3/uL (ref 0.0–0.7)
Eosinophils Relative: 3.3 % (ref 0.0–5.0)
HCT: 41.1 % (ref 39.0–52.0)
Hemoglobin: 13.4 g/dL (ref 13.0–17.0)
Lymphocytes Relative: 22.9 % (ref 12.0–46.0)
Lymphs Abs: 1.8 10*3/uL (ref 0.7–4.0)
MCHC: 32.7 g/dL (ref 30.0–36.0)
MCV: 99.3 fL (ref 78.0–100.0)
Monocytes Absolute: 0.7 10*3/uL (ref 0.1–1.0)
Monocytes Relative: 9 % (ref 3.0–12.0)
Neutro Abs: 5.1 10*3/uL (ref 1.4–7.7)
Neutrophils Relative %: 64.3 % (ref 43.0–77.0)
Platelets: 168 10*3/uL (ref 150.0–400.0)
RBC: 4.14 Mil/uL — ABNORMAL LOW (ref 4.22–5.81)
RDW: 13.6 % (ref 11.5–15.5)
WBC: 8 10*3/uL (ref 4.0–10.5)

## 2023-01-15 LAB — LIPID PANEL
Cholesterol: 102 mg/dL (ref 0–200)
HDL: 57.9 mg/dL (ref >39.00)
LDL Cholesterol: 33 mg/dL (ref 0–99)
NonHDL: 43.85
Total CHOL/HDL Ratio: 2
Triglycerides: 52 mg/dL (ref 0.0–149.0)
VLDL: 10.4 mg/dL (ref 0.0–40.0)

## 2023-01-15 LAB — COMPREHENSIVE METABOLIC PANEL
ALT: 18 U/L (ref 0–53)
AST: 19 U/L (ref 0–37)
Albumin: 4.1 g/dL (ref 3.5–5.2)
Alkaline Phosphatase: 85 U/L (ref 39–117)
BUN: 20 mg/dL (ref 6–23)
CO2: 32 meq/L (ref 19–32)
Calcium: 9.2 mg/dL (ref 8.4–10.5)
Chloride: 98 meq/L (ref 96–112)
Creatinine, Ser: 1.17 mg/dL (ref 0.40–1.50)
GFR: 61.29 mL/min (ref >60.00)
Glucose, Bld: 115 mg/dL — ABNORMAL HIGH (ref 70–99)
Potassium: 4 meq/L (ref 3.5–5.1)
Sodium: 137 meq/L (ref 135–145)
Total Bilirubin: 1 mg/dL (ref 0.2–1.2)
Total Protein: 6.1 g/dL (ref 6.0–8.3)

## 2023-01-15 LAB — HEMOGLOBIN A1C: Hgb A1c MFr Bld: 6.3 % (ref 4.6–6.5)

## 2023-01-15 NOTE — Addendum Note (Signed)
Addended byConrad [place] D on: 01/15/2023 02:17 PM   Modules accepted: Orders

## 2023-01-15 NOTE — Progress Notes (Signed)
Subjective:    Patient ID: Kirk Sutton, male    DOB: Feb 19, 1951, 72 y.o.   MRN: [id number]  DOS:  01/15/2023 Type of visit - description: CPX  Here for CPX.  No new concerns.  Chronic back pain and baseline.  Review of Systems  Other than above, a 14 point review of systems is negative     Past Medical History:  Diagnosis Date   Allergic rhinitis    Asthma    Chronic prostatitis    sees urology   Diabetes mellitus    type 11 (a1c 6.0 03/2008)/no meds.   Hernia, umbilical    Hyperlipidemia    Hypertension    Psoriasis    @ hands, sees derm    Past Surgical History:  Procedure Laterality Date   APPENDECTOMY     FERTILITY SURGERY  03/19/1977   Spermatic Vein Ligation   HERNIA REPAIR     1950s   NASAL SEPTUM SURGERY  03/20/2007   SHOULDER SURGERY  03/19/2006   left   TESTICLE SURGERY     undescended repair    UMBILICAL HERNIA REPAIR  08/28/2021   VASECTOMY     Social History   Socioeconomic History   Marital status: Married    Spouse name: Not on file   Number of children: 2   Years of education: Not on file   Highest education level: Not on file  Occupational History   Occupation: Diplomatic Services operational officer  Tobacco Use   Smoking status: Never   Smokeless tobacco: Never  Vaping Use   Vaping status: Never Used  Substance and Sexual Activity   Alcohol use: Yes    Comment: socially, rare   Drug use: No   Sexual activity: Yes  Other Topics Concern   Not on file  Social History Narrative   Household-- pt, wife    Social Determinants of Health   Financial Resource Strain: Low Risk  (04/20/2021)   Overall Financial Resource Strain (CARDIA)    Difficulty of Paying Living Expenses: Not hard at all  Food Insecurity: No Food Insecurity (05/04/2022)   Hunger Vital Sign    Worried About Running Out of Food in the Last Year: Never true    Ran Out of Food in the Last Year: Never true  Transportation Needs: No Transportation Needs (05/04/2022)    PRAPARE - Administrator, Civil Service (Medical): No    Lack of Transportation (Non-Medical): No  Physical Activity: Sufficiently Active (02/22/2022)   Exercise Vital Sign    Days of Exercise per Week: 5 days    Minutes of Exercise per Session: 30 min  Stress: No Stress Concern Present (04/20/2021)   [employer] of Occupational Health - Occupational Stress Questionnaire    Feeling of Stress : Not at all  Social Connections: Socially Integrated (04/20/2021)   Social Connection and Isolation Panel [NHANES]    Frequency of Communication with Friends and Family: More than three times a week    Frequency of Social Gatherings with Friends and Family: More than three times a week    Attends Religious Services: More than 4 times per year    Active Member of [employer] or Organizations: Yes    Attends Banker Meetings: 1 to 4 times per year    Marital Status: Married  Catering manager Violence: Not At Risk (05/04/2022)   Humiliation, Afraid, Rape, and Kick questionnaire    Fear of Current or Ex-Partner: No  Emotionally Abused: No    Physically Abused: No    Sexually Abused: No     Current Outpatient Medications  Medication Instructions   acetaminophen (TYLENOL) 650 mg, Every 8 hours PRN   acetaminophen-codeine (TYLENOL #2) 300-15 MG tablet 1 tablet, Every 4 hours PRN   albuterol (VENTOLIN HFA) 108 (90 Base) MCG/ACT inhaler 2 puffs, Inhalation, Every 6 hours PRN   amphetamine-dextroamphetamine (ADDERALL) 15 MG tablet 15 mg, Oral, 2 times daily, In morning and at noon<BR>   atorvastatin (LIPITOR) 20 mg, Oral, Daily at bedtime   clobetasol ointment (TEMOVATE) 0.05 % 1 application , Topical, 2 times daily PRN, For hands    Coenzyme Q10 100 mg, Oral, Daily   cycloSPORINE (RESTASIS) 0.05 % ophthalmic emulsion 1 drop, 2 times daily   desloratadine (CLARINEX) 10 mg, Oral, Daily   FLUoxetine (PROZAC) 40 MG capsule Take 1 tablet daily   Fluticasone Furoate (FLONASE  SENSIMIST NA) Nasal, Daily PRN   fluticasone-salmeterol (ADVAIR HFA) 115-21 MCG/ACT inhaler 2 puffs, Inhalation, 2 times daily   hydrochlorothiazide (HYDRODIURIL) 25 mg, Oral, Daily   ibuprofen (ADVIL) 200 mg, Oral, Every 6 hours PRN   Magnesium Gluconate 400 mg, Oral, Daily   metoprolol succinate (TOPROL-XL) 50 mg, Oral, Daily, Take with or immediately following a meal   montelukast (SINGULAIR) 10 MG tablet TAKE 1 TABLET(10 MG) BY MOUTH AT BEDTIME   Psyllium (METAMUCIL PO) 90 mg, Oral, Daily   pyridOXINE (VITAMIN B6) 400 mg, Oral, Daily, Super B complex   terazosin (HYTRIN) 3 mg, Oral, Daily   Turmeric 1,000 mg, Oral, Daily   vitamin C 1,000 mg, Oral, Daily   Vitamin D, Cholecalciferol, 25 MCG (1000 UT) CAPS 1 capsule, Oral, Daily       Objective:   Physical Exam BP 134/76   Pulse (!) 55   Temp 97.9 F (36.6 C) (Oral)   Resp 18   Ht 5\' 11"  (1.803 m)   Wt 235 lb (106.6 kg)   SpO2 96%   BMI 32.78 kg/m  General: Well developed, NAD, BMI noted Neck: No  thyromegaly  HEENT:  Normocephalic . Face symmetric, atraumatic Lungs:  CTA B Normal respiratory effort, no intercostal retractions, no accessory muscle use. Heart: RRR,  no murmur.  Abdomen:  Not distended, soft, non-tender. No rebound or rigidity.   Lower extremities: no pretibial edema bilaterally  Skin: Exposed areas without rash. Not pale. Not jaundice Neurologic:  alert & oriented X3.  Speech normal, gait appropriate for age and unassisted Strength symmetric and appropriate for age.  Psych: Cognition and judgment appear intact.  Cooperative with normal attention span and concentration.  Behavior appropriate. No anxious or depressed appearing.     Assessment     Assessment Prediabetes  HTN Hyperlipidemia Anxiety Dr Darlys Gales (psych)   Cough variant asthma, atopic dermatitis, allergic rhinitis and allergic conjunctivitis.Dr Lorenso Courier MSK:  --Chronic back pain, pain meds prn --DJD- hands Chronic prostatitis,  sees urology Psoriasis, hands, sees dermatology Snoring - dentist Rx a moth guard ~ 08-2015, helps Negative a stress test 09-2021  PLAN Here for CPX - Td 2024 -  PNM 23: 2015 and 2023; prevnar:  2016  - Zostavax 2013 -Flu shot today - Rec Covid booster, RSV, Shingrix  - CCS: Colonoscopy Dr Luther Parody 2005 (-),   Cscope 02/2017, over due for a colonoscopy, offered a referral, he again said he will call himself and set it up.   -Sees urology yearly    -Lifestyle: Trying to do well, taking walks frequently. -  Labs: CMP, FLP, CBC  - Healthcare POA: Information provided Prediabetes: Last A1c very good HTN: Reports good ambulatory BPs, continue HCTZ, metoprolol, terazosin.  Checking labs High cholesterol: On atorvastatin.  Checking labs MSK: Chronic back pain, seen elsewhere, reports he is taking mostly ibuprofen, GI precautions recommended. RTC 6 to 12 months

## 2023-01-15 NOTE — Assessment & Plan Note (Signed)
Here for CPX Prediabetes: Last A1c very good HTN: Reports good ambulatory BPs, continue HCTZ, metoprolol, terazosin.  Checking labs High cholesterol: On atorvastatin.  Checking labs MSK: Chronic back pain, seen elsewhere, reports he is taking mostly ibuprofen, GI precautions recommended. RTC 6 to 12 months

## 2023-01-15 NOTE — Patient Instructions (Addendum)
Vaccines I recommend: Shingrix (shingles) RSV vaccine COVID booster   Is important you proceed with a colonoscopy.  Check the  blood pressure regularly Blood pressure goal:  between 110/65 and  135/85. If it is consistently higher or lower, let me know     GO TO THE LAB : Get the blood work     Next visit with me in 6 months for a checkup Please schedule it at the front desk        "Health Care Power of attorney" ,  "Living will" (Advance care planning documents)  If you already have a living will or healthcare power of attorney, is recommended you bring the copy to be scanned in your chart.   The document will be available to all the doctors you see in the system.  Advance care planning is a process that supports adults in  understanding and sharing their preferences regarding future medical care.  The patient's preferences are recorded in documents called Advance Directives and the can be modified at any time while the patient is in full mental capacity.   If you don't have one, please consider create one.      More information at: [email or web address]

## 2023-01-15 NOTE — Assessment & Plan Note (Signed)
Here for CPX - Td 2024 -  PNM 23: 2015 and 2023; prevnar:  2016  - Zostavax 2013 -Flu shot today - Rec Covid booster, RSV, Shingrix  - CCS: Colonoscopy Dr Luther Parody 2005 (-),   Cscope 02/2017, over due for a colonoscopy, offered a referral, he again said he will call himself and set it up.   -Sees urology yearly    -Lifestyle: Trying to do well, taking walks frequently. -Labs: CMP, FLP, CBC  - Healthcare POA: Information provided

## 2023-01-16 ENCOUNTER — Encounter: Payer: Self-pay | Admitting: Gastroenterology

## 2023-01-17 ENCOUNTER — Telehealth: Payer: Self-pay | Admitting: Internal Medicine

## 2023-01-17 DIAGNOSIS — M1711 Unilateral primary osteoarthritis, right knee: Secondary | ICD-10-CM | POA: Diagnosis not present

## 2023-01-17 DIAGNOSIS — M25561 Pain in right knee: Secondary | ICD-10-CM | POA: Diagnosis not present

## 2023-01-17 NOTE — Telephone Encounter (Signed)
Have not been reviewed by PCP yet.

## 2023-01-17 NOTE — Telephone Encounter (Signed)
Patient called to discuss lab results. Please call and advise.

## 2023-01-30 DIAGNOSIS — M1711 Unilateral primary osteoarthritis, right knee: Secondary | ICD-10-CM | POA: Diagnosis not present

## 2023-02-04 ENCOUNTER — Telehealth: Payer: Self-pay | Admitting: Internal Medicine

## 2023-02-04 NOTE — Telephone Encounter (Signed)
Patient would like a call back regarding a medication. Please call

## 2023-02-05 ENCOUNTER — Encounter: Payer: Self-pay | Admitting: Internal Medicine

## 2023-02-05 ENCOUNTER — Ambulatory Visit (INDEPENDENT_AMBULATORY_CARE_PROVIDER_SITE_OTHER): Payer: Medicare (Managed Care) | Admitting: Internal Medicine

## 2023-02-05 VITALS — BP 126/70 | HR 68 | Temp 97.9°F | Resp 16 | Ht 71.0 in | Wt 236.2 lb

## 2023-02-05 DIAGNOSIS — J4541 Moderate persistent asthma with (acute) exacerbation: Secondary | ICD-10-CM

## 2023-02-05 DIAGNOSIS — J069 Acute upper respiratory infection, unspecified: Secondary | ICD-10-CM | POA: Diagnosis not present

## 2023-02-05 LAB — POCT INFLUENZA A/B
Influenza A, POC: NEGATIVE
Influenza B, POC: NEGATIVE

## 2023-02-05 LAB — POC COVID19 BINAXNOW: SARS Coronavirus 2 Ag: NEGATIVE

## 2023-02-05 MED ORDER — DOXYCYCLINE HYCLATE 100 MG PO TABS: 100.0000 mg | ORAL_TABLET | Freq: Two times a day (BID) | ORAL | 0 refills | Status: DC

## 2023-02-05 MED ORDER — PREDNISONE 10 MG PO TABS: ORAL_TABLET | ORAL | 0 refills | Status: DC

## 2023-02-05 MED ORDER — BENZONATATE 200 MG PO CAPS: 200.0000 mg | ORAL_CAPSULE | Freq: Three times a day (TID) | ORAL | 0 refills | Status: DC | PRN

## 2023-02-05 NOTE — Assessment & Plan Note (Signed)
Bronchitis, asthma exacerbation.   History of asthma, + cough and wheezing Flu and COVID tests negative.  Most likely has bronchitis. Plan: Doxycycline, prednisone, continue Advair and albuterol. Symptomatic treatment with benzonatate, Robitussin. See AVS, call if not better.

## 2023-02-05 NOTE — Progress Notes (Signed)
Subjective:    Patient ID: Kirk Sutton, male    DOB: 01/04/1951, 72 y.o.   MRN: [id number]  DOS:  02/05/2023 Type of visit - description: Acute  The patient came back from [place] visiting family about a week ago.  Symptoms started 2 to 3 days ago: Cough with chest congestion and wheezing.  Very little sputum production mostly in the morning.  Does not recall any sick contacts. Denies fever or chills. No sinus pain but admits to some nasal discharge.  Greenish in color. Feels slightly tired, no unusual aches.  Denies chest pain or palpitation.  No leg swelling or calf pain.  Review of Systems See above   Past Medical History:  Diagnosis Date   Allergic rhinitis    Asthma    Chronic prostatitis    sees urology   Diabetes mellitus    type 11 (a1c 6.0 03/2008)/no meds.   Hernia, umbilical    Hyperlipidemia    Hypertension    Psoriasis    @ hands, sees derm    Past Surgical History:  Procedure Laterality Date   APPENDECTOMY     FERTILITY SURGERY  03/19/1977   Spermatic Vein Ligation   HERNIA REPAIR     1950s   NASAL SEPTUM SURGERY  03/20/2007   SHOULDER SURGERY  03/19/2006   left   TESTICLE SURGERY     undescended repair    UMBILICAL HERNIA REPAIR  08/28/2021   VASECTOMY      Current Outpatient Medications  Medication Instructions   acetaminophen (TYLENOL) 650 mg, Every 8 hours PRN   acetaminophen-codeine (TYLENOL #2) 300-15 MG tablet 1 tablet, Every 4 hours PRN   albuterol (VENTOLIN HFA) 108 (90 Base) MCG/ACT inhaler 2 puffs, Inhalation, Every 6 hours PRN   amphetamine-dextroamphetamine (ADDERALL) 15 MG tablet 15 mg, Oral, 2 times daily, In morning and at noon<BR>   atorvastatin (LIPITOR) 20 mg, Oral, Daily at bedtime   clobetasol ointment (TEMOVATE) 0.05 % 1 application , Topical, 2 times daily PRN, For hands    Coenzyme Q10 100 mg, Oral, Daily   cycloSPORINE (RESTASIS) 0.05 % ophthalmic emulsion 1 drop, 2 times daily   desloratadine (CLARINEX) 10 mg,  Oral, Daily   FLUoxetine (PROZAC) 40 MG capsule Take 1 tablet daily   Fluticasone Furoate (FLONASE SENSIMIST NA) Nasal, Daily PRN   fluticasone-salmeterol (ADVAIR HFA) 115-21 MCG/ACT inhaler 2 puffs, Inhalation, 2 times daily   hydrochlorothiazide (HYDRODIURIL) 25 mg, Oral, Daily   ibuprofen (ADVIL) 200 mg, Oral, Every 6 hours PRN   Magnesium Gluconate 400 mg, Oral, Daily   metoprolol succinate (TOPROL-XL) 50 mg, Oral, Daily, Take with or immediately following a meal   montelukast (SINGULAIR) 10 MG tablet TAKE 1 TABLET(10 MG) BY MOUTH AT BEDTIME   Psyllium (METAMUCIL PO) 90 mg, Oral, Daily   pyridOXINE (VITAMIN B6) 400 mg, Oral, Daily, Super B complex   terazosin (HYTRIN) 3 mg, Oral, Daily   Turmeric 1,000 mg, Oral, Daily   vitamin C 1,000 mg, Oral, Daily   Vitamin D, Cholecalciferol, 25 MCG (1000 UT) CAPS 1 capsule, Oral, Daily       Objective:   Physical Exam BP 126/70   Pulse 68   Temp 97.9 F (36.6 C) (Oral)   Resp 16   Ht 5\' 11"  (1.803 m)   Wt 236 lb 4 oz (107.2 kg)   SpO2 97%   BMI 32.95 kg/m  General:   Well developed, NAD, BMI noted. HEENT:  Normocephalic . Face symmetric, atraumatic  TMs normal.  Throat symmetric.  Nose: Congested. Lungs:  Audible rhonchi with cough.   + Rhonchi on exam, mild. Increased expiratory time. Normal respiratory effort, no intercostal retractions, no accessory muscle use. Heart: RRR,  no murmur.  Lower extremities: no pretibial edema bilaterally  Skin: Not pale. Not jaundice Neurologic:  alert & oriented X3.  Speech normal, gait appropriate for age and unassisted Psych--  Cognition and judgment appear intact.  Cooperative with normal attention span and concentration.  Behavior appropriate. No anxious or depressed appearing.      Assessment   Assessment Prediabetes  HTN Hyperlipidemia Anxiety Dr Darlys Gales (psych)   Cough variant asthma, atopic dermatitis, allergic rhinitis and allergic conjunctivitis.Dr Lorenso Courier MSK:  --Chronic  back pain, pain meds prn --DJD- hands Chronic prostatitis, sees urology Psoriasis, hands, sees dermatology Snoring - dentist Rx a moth guard ~ 08-2015, helps Negative a stress test 09-2021  PLAN Bronchitis, asthma exacerbation.   History of asthma, + cough and wheezing Flu and COVID tests negative.  Most likely has bronchitis. Plan: Doxycycline, prednisone, continue Advair and albuterol. Symptomatic treatment with benzonatate, Robitussin. See AVS, call if not better.

## 2023-02-05 NOTE — Patient Instructions (Signed)
  Rest, fluids , tylenol  Start taking prednisone as prescribed  Start taking the antibiotic, doxycycline, as prescribed  Continue using Advair and albuterol.   For cough:  Take Mucinex DM or Robitussin-DM OTC.  Follow the instructions in the box. You can also take the benzonatate.  Prescription was sent.  For nasal congestion: -Use over-the-counter Flonase: 2 nasal sprays on each side of the nose in the morning until you feel better   Avoid decongestants such as  Pseudoephedrine or phenylephrine     Call if not gradually better over the next  10 days   Call anytime if the symptoms are severe, you have high fever, short of breath, chest pain

## 2023-02-05 NOTE — Telephone Encounter (Signed)
Patient has a question about a new over-the-counter medication but he did not remember the name. It sounds like it might be a THC type product but without the name I can't review all the ingredients.  Patient will find out name and get back with me.

## 2023-02-12 DIAGNOSIS — F331 Major depressive disorder, recurrent, moderate: Secondary | ICD-10-CM | POA: Diagnosis not present

## 2023-02-17 DIAGNOSIS — M5416 Radiculopathy, lumbar region: Secondary | ICD-10-CM | POA: Diagnosis not present

## 2023-02-17 DIAGNOSIS — M51362 Other intervertebral disc degeneration, lumbar region with discogenic back pain and lower extremity pain: Secondary | ICD-10-CM | POA: Diagnosis not present

## 2023-03-02 DIAGNOSIS — M5416 Radiculopathy, lumbar region: Secondary | ICD-10-CM | POA: Diagnosis not present

## 2023-03-07 DIAGNOSIS — D225 Melanocytic nevi of trunk: Secondary | ICD-10-CM | POA: Diagnosis not present

## 2023-03-07 DIAGNOSIS — L821 Other seborrheic keratosis: Secondary | ICD-10-CM | POA: Diagnosis not present

## 2023-03-07 DIAGNOSIS — Z85828 Personal history of other malignant neoplasm of skin: Secondary | ICD-10-CM | POA: Diagnosis not present

## 2023-03-07 DIAGNOSIS — D692 Other nonthrombocytopenic purpura: Secondary | ICD-10-CM | POA: Diagnosis not present

## 2023-03-07 DIAGNOSIS — L814 Other melanin hyperpigmentation: Secondary | ICD-10-CM | POA: Diagnosis not present

## 2023-03-07 DIAGNOSIS — M1711 Unilateral primary osteoarthritis, right knee: Secondary | ICD-10-CM | POA: Diagnosis not present

## 2023-03-07 DIAGNOSIS — Z129 Encounter for screening for malignant neoplasm, site unspecified: Secondary | ICD-10-CM | POA: Diagnosis not present

## 2023-03-07 DIAGNOSIS — D1801 Hemangioma of skin and subcutaneous tissue: Secondary | ICD-10-CM | POA: Diagnosis not present

## 2023-03-07 DIAGNOSIS — L281 Prurigo nodularis: Secondary | ICD-10-CM | POA: Diagnosis not present

## 2023-03-11 ENCOUNTER — Ambulatory Visit (AMBULATORY_SURGERY_CENTER): Payer: Medicare (Managed Care)

## 2023-03-11 VITALS — Ht 71.0 in | Wt 233.0 lb

## 2023-03-11 DIAGNOSIS — Z860100 Personal history of colon polyps, unspecified: Secondary | ICD-10-CM

## 2023-03-11 MED ORDER — NA SULFATE-K SULFATE-MG SULF 17.5-3.13-1.6 GM/177ML PO SOLN: 1.0000 | Freq: Once | ORAL | 0 refills | Status: AC

## 2023-03-11 NOTE — Progress Notes (Signed)
No egg or soy allergy known to patient  No issues known to pt with past sedation with any surgeries or procedures Patient denies ever being told they had issues or difficulty with intubation  No FH of Malignant Hyperthermia Pt is not on diet pills Pt is not on  home 02  Pt is not on blood thinners  Pt denies issues with constipation. Taking metamucil daily.  No A fib or A flutter Have any cardiac testing pending-- no  LOA: independent  Prep: suprep extra Miralax   Patient's chart reviewed by Cathlyn Parsons CNRA prior to previsit and patient appropriate for the [facility].  Previsit completed and red dot placed by patient's name on their procedure day (on provider's schedule).     PV competed with patient. Prep instructions sent via mychart and home address. Goodrx coupon for [employer] provided to use for price reduction if needed.

## 2023-03-14 DIAGNOSIS — M1711 Unilateral primary osteoarthritis, right knee: Secondary | ICD-10-CM | POA: Diagnosis not present

## 2023-03-18 ENCOUNTER — Telehealth: Payer: Self-pay | Admitting: Gastroenterology

## 2023-03-18 NOTE — Telephone Encounter (Signed)
New prep instructions sent vi mychartd. Hard copy will be mailed to address on file.

## 2023-03-18 NOTE — Telephone Encounter (Signed)
Inbound call from patient requesting for updated paperwork for 05/13/23 colonoscopy to be sent through the mail and mychart. Please advise, thank you.

## 2023-03-22 DIAGNOSIS — M1711 Unilateral primary osteoarthritis, right knee: Secondary | ICD-10-CM | POA: Diagnosis not present

## 2023-03-25 ENCOUNTER — Encounter: Payer: Medicare (Managed Care) | Admitting: Gastroenterology

## 2023-03-27 ENCOUNTER — Other Ambulatory Visit: Payer: Self-pay | Admitting: Internal Medicine

## 2023-04-13 ENCOUNTER — Other Ambulatory Visit: Payer: Self-pay | Admitting: Internal Medicine

## 2023-04-28 ENCOUNTER — Other Ambulatory Visit: Payer: Self-pay | Admitting: Internal Medicine

## 2023-05-01 DIAGNOSIS — M25561 Pain in right knee: Secondary | ICD-10-CM | POA: Diagnosis not present

## 2023-05-06 ENCOUNTER — Telehealth: Payer: Self-pay | Admitting: Gastroenterology

## 2023-05-06 NOTE — Telephone Encounter (Signed)
 Called patient and went over medications Holds.  Hold Celebrex and ibuprofen for 7 days prior to procedure.

## 2023-05-06 NOTE — Telephone Encounter (Signed)
 Inbound call from patient stating he recently started anti inflammatory medication. Requesting to know if it is okay to continue taking medication prior to 2/24 colonoscopy. Please advise, thank you.

## 2023-05-07 ENCOUNTER — Ambulatory Visit (INDEPENDENT_AMBULATORY_CARE_PROVIDER_SITE_OTHER): Payer: Medicare (Managed Care)

## 2023-05-07 VITALS — Ht 71.0 in | Wt 229.0 lb

## 2023-05-07 DIAGNOSIS — Z0000 Encounter for general adult medical examination without abnormal findings: Secondary | ICD-10-CM

## 2023-05-07 NOTE — Patient Instructions (Addendum)
 Mr. Kirk Sutton , Thank you for taking time to come for your Medicare Wellness Visit. I appreciate your ongoing commitment to your health goals. Please review the following plan we discussed and let me know if I can assist you in the future.   Referrals/Orders/Follow-Ups/Clinician Recommendations:   This is a list of the screening recommended for you and due dates:  Health Maintenance  Topic Date Due   Zoster (Shingles) Vaccine (1 of 2) 04/03/1967   Colon Cancer Screening  01/15/2024*   Medicare Annual Wellness Visit  05/06/2024   DTaP/Tdap/Td vaccine (4 - Td or Tdap) 04/04/2032   Pneumonia Vaccine  Completed   Flu Shot  Completed   Hepatitis C Screening  Completed   HPV Vaccine  Aged Out   COVID-19 Vaccine  Discontinued  *Topic was postponed. The date shown is not the original due date.    Advanced directives: (Copy Requested) Please bring a copy of your health care power of attorney and living will to the office to be added to your chart at your convenience.  Next Medicare Annual Wellness Visit scheduled for next year: Yes

## 2023-05-07 NOTE — Progress Notes (Signed)
 Subjective:   Kirk Sutton is a 73 y.o. male who presents for Medicare Annual/Subsequent preventive examination.  Visit Complete: Virtual I connected with  USTIN CRUICKSHANK on 05/07/23 by a audio enabled telemedicine application and verified that I am speaking with the correct person using two identifiers.  Patient Location: Home  Provider Location: Home Office  I discussed the limitations of evaluation and management by telemedicine. The patient expressed understanding and agreed to proceed.  Vital Signs: Because this visit was a virtual/telehealth visit, some criteria may be missing or patient reported. Any vitals not documented were not able to be obtained and vitals that have been documented are patient reported.    Cardiac Risk Factors include: advanced age (>16men, >48 women);male gender;hypertension     Objective:    Today's Vitals   05/07/23 0938  Weight: 229 lb (103.9 kg)  Height: 5\' 11"  (1.803 m)   Body mass index is 31.94 kg/m.     05/07/2023    9:47 AM 05/04/2022    8:26 AM 04/20/2021    8:28 AM 04/14/2020    9:04 AM 03/27/2019    8:13 AM 12/24/2016    7:42 AM 11/11/2015    9:49 AM  Advanced Directives  Does Patient Have a Medical Advance Directive? Yes Yes Yes Yes Yes Yes Yes  Type of Estate agent of [place];Living will Healthcare Power of [place];Living will;Out of facility DNR (pink MOST or yellow form) Healthcare Power of [place];Living will Healthcare Power of [place];Living will Healthcare Power of [place];Living will Healthcare Power of [place];Living will Living will  Does patient want to make changes to medical advance directive?  No - Patient declined   No - Patient declined    Copy of Healthcare Power of Attorney in Chart? No - copy requested No - copy requested No - copy requested No - copy requested No - copy requested  No - copy requested    Current Medications (verified) Outpatient Encounter Medications as of 05/07/2023   Medication Sig   hydrochlorothiazide (HYDRODIURIL) 25 MG tablet Take 1 tablet (25 mg total) by mouth daily.   acetaminophen (TYLENOL) 650 MG CR tablet Take 650 mg by mouth every 8 (eight) hours as needed for pain. (Patient not taking: Reported on 01/15/2023)   acetaminophen-codeine (TYLENOL #2) 300-15 MG tablet Take 1 tablet by mouth every 4 (four) hours as needed for moderate pain (pain score 4-6). (Patient not taking: Reported on 01/15/2023)   albuterol (VENTOLIN HFA) 108 (90 Base) MCG/ACT inhaler Inhale 2 puffs into the lungs every 6 (six) hours as needed for wheezing or shortness of breath.   ALPRAZolam (XANAX) 0.5 MG tablet Take 0.5 mg by mouth 2 (two) times daily as needed.   amphetamine-dextroamphetamine (ADDERALL) 15 MG tablet Take 15 mg by mouth in the morning and at bedtime. In morning and at noon   Ascorbic Acid (VITAMIN C) 1000 MG tablet Take 1,000 mg by mouth daily.   atorvastatin (LIPITOR) 20 MG tablet Take 1 tablet (20 mg total) by mouth at bedtime.   azelastine (ASTELIN) 0.1 % nasal spray Place 1 spray into both nostrils every evening.   b complex vitamins capsule Take 1 capsule by mouth daily.   benzonatate (TESSALON) 200 MG capsule Take 1 capsule (200 mg total) by mouth 3 (three) times daily as needed for cough.   betamethasone acetate-betamethasone sodium phosphate (CELESTONE) 6 (3-3) MG/ML injection 6 mg.   clobetasol ointment (TEMOVATE) 0.05 % Apply 1 application topically 2 (two) times daily  as needed. For hands   Coenzyme Q10 100 MG capsule Take 100 mg by mouth daily.   cycloSPORINE (RESTASIS) 0.05 % ophthalmic emulsion 1 drop 2 (two) times daily. (Patient not taking: Reported on 03/11/2023)   desloratadine (CLARINEX) 5 MG tablet Take 10 mg by mouth daily.   doxycycline (VIBRA-TABS) 100 MG tablet Take 1 tablet (100 mg total) by mouth 2 (two) times daily. (Patient not taking: Reported on 03/11/2023)   FLUoxetine (PROZAC) 40 MG capsule Take 1 tablet daily   fluticasone  (FLONASE) 50 MCG/ACT nasal spray Place 2 sprays into both nostrils daily.   Fluticasone Furoate (FLONASE SENSIMIST NA) Place into the nose daily as needed.   fluticasone-salmeterol (ADVAIR HFA) 115-21 MCG/ACT inhaler Inhale 2 puffs into the lungs in the morning and at bedtime.   ibuprofen (ADVIL) 200 MG tablet Take 200 mg by mouth every 6 (six) hours as needed.   Magnesium Gluconate 250 MG TABS Take 400 mg by mouth daily.   metoprolol succinate (TOPROL-XL) 50 MG 24 hr tablet Take 1 tablet (50 mg total) by mouth daily. Take with or immediately following a meal   montelukast (SINGULAIR) 10 MG tablet TAKE 1 TABLET(10 MG) BY MOUTH AT BEDTIME (Patient not taking: Reported on 03/11/2023)   predniSONE (DELTASONE) 10 MG tablet 3 tabs x 3 days, 2 tabs x 3 days, 1 tab x 3 days (Patient not taking: Reported on 03/11/2023)   Psyllium (METAMUCIL PO) Take 90 mg by mouth daily.   pyridOXINE (VITAMIN B-6) 100 MG tablet Take 400 mg by mouth daily. Super B complex   terazosin (HYTRIN) 1 MG capsule Take 3 capsules (3 mg total) by mouth daily.   Turmeric 500 MG CAPS Take 1,000 mg by mouth daily.   Vitamin D, Cholecalciferol, 25 MCG (1000 UT) CAPS Take 1 capsule by mouth daily.   No facility-administered encounter medications on file as of 05/07/2023.    Allergies (verified) Cetirizine hcl and Fexofenadine   History: Past Medical History:  Diagnosis Date   Allergic rhinitis    Asthma    Chronic prostatitis    sees urology   Diabetes mellitus    type 11 (a1c 6.0 03/2008)/no meds.   Hernia, umbilical    Hyperlipidemia    Hypertension    Psoriasis    @ hands, sees derm   Past Surgical History:  Procedure Laterality Date   APPENDECTOMY     FERTILITY SURGERY  03/19/1977   Spermatic Vein Ligation   HERNIA REPAIR     1950s   NASAL SEPTUM SURGERY  03/20/2007   SHOULDER SURGERY  03/19/2006   left   TESTICLE SURGERY     undescended repair    UMBILICAL HERNIA REPAIR  08/28/2021   VASECTOMY     Family  History  Problem Relation Age of Onset   Diabetes Sister    Hypertension Mother    Colon polyps Mother    Hypertension Sister    Heart attack Father        x 3 onset age 93, died at 9   Colon polyps Father    Cancer Father        bladder   Colon cancer Neg Hx    Prostate cancer Neg Hx    Stomach cancer Neg Hx    Social History   Socioeconomic History   Marital status: Married    Spouse name: Not on file   Number of children: 2   Years of education: Not on file   Highest education  level: Not on file  Occupational History   Occupation: Diplomatic Services operational officer  Tobacco Use   Smoking status: Never   Smokeless tobacco: Never  Vaping Use   Vaping status: Never Used  Substance and Sexual Activity   Alcohol use: Yes    Comment: socially, rare   Drug use: No   Sexual activity: Yes  Other Topics Concern   Not on file  Social History Narrative   Household-- pt, wife    Social Drivers of Corporate investment banker Strain: Low Risk  (05/07/2023)   Overall Financial Resource Strain (CARDIA)    Difficulty of Paying Living Expenses: Not hard at all  Food Insecurity: No Food Insecurity (05/07/2023)   Hunger Vital Sign    Worried About Running Out of Food in the Last Year: Never true    Ran Out of Food in the Last Year: Never true  Transportation Needs: No Transportation Needs (05/07/2023)   PRAPARE - Administrator, Civil Service (Medical): No    Lack of Transportation (Non-Medical): No  Physical Activity: Sufficiently Active (05/07/2023)   Exercise Vital Sign    Days of Exercise per Week: 7 days    Minutes of Exercise per Session: 60 min  Stress: No Stress Concern Present (05/07/2023)   [employer] of Occupational Health - Occupational Stress Questionnaire    Feeling of Stress : Not at all  Social Connections: Socially Integrated (05/07/2023)   Social Connection and Isolation Panel [NHANES]    Frequency of Communication with Friends and  Family: More than three times a week    Frequency of Social Gatherings with Friends and Family: More than three times a week    Attends Religious Services: More than 4 times per year    Active Member of [employer] or Organizations: Yes    Attends Engineer, structural: More than 4 times per year    Marital Status: Married    Tobacco Counseling Counseling given: Not Answered   Clinical Intake:  Pre-visit preparation completed: Yes  Pain : No/denies pain     BMI - recorded: 31.94 Nutritional Status: BMI > 30  Obese Nutritional Risks: None Diabetes: No  How often do you need to have someone help you when you read instructions, pamphlets, or other written materials from your doctor or pharmacy?: 1 - Never  Interpreter Needed?: No  Information entered by :: Theresa Mulligan LPN   Activities of Daily Living    05/07/2023    9:45 AM  In your present state of health, do you have any difficulty performing the following activities:  Hearing? 0  Vision? 0  Difficulty concentrating or making decisions? 0  Walking or climbing stairs? 0  Dressing or bathing? 0  Doing errands, shopping? 0  Preparing Food and eating ? N  Using the Toilet? N  In the past six months, have you accidently leaked urine? N  Do you have problems with loss of bowel control? N  Managing your Medications? N  Managing your Finances? N  Housekeeping or managing your Housekeeping? N    Patient Care Team: Wanda Plump, MD as PCP - General Cleatrice Burke Forestine Chute., MD as Consulting Physician (Urology) Nelson Chimes, MD as Consulting Physician (Ophthalmology) Reginia Naas, MD as Consulting Physician (Dermatology) Helane Gunther, DPM as Consulting Physician (Podiatry) Dominica Severin, MD as Consulting Physician (Orthopedic Surgery) Marcellina Millin, MD (Psychiatry) Sheran Luz, MD as Consulting Physician (Physical Medicine and Rehabilitation) Eileen Stanford, MD as  Referring Physician (Allergy and  Immunology) Henrene Pastor, RPH-CPP (Pharmacist)  Indicate any recent Medical Services you may have received from other than [facility] providers in the past year (date may be approximate).     Assessment:   This is a routine wellness examination for Fynn.  Hearing/Vision screen Hearing Screening - Comments:: Denies hearing difficulties   Vision Screening - Comments:: Wears rx glasses and Contacts - up to date with routine eye exams with  [facility]   Goals Addressed               This Visit's Progress     Lose weight (pt-stated)        Continue no sugar diet.       Depression Screen    05/07/2023    9:44 AM 01/15/2023    7:56 AM 07/18/2022    8:00 AM 05/04/2022    8:30 AM 04/04/2022    8:26 AM 01/10/2022    2:53 PM 06/19/2021    8:45 AM  PHQ 2/9 Scores  PHQ - 2 Score 0 0 0 0 0 0 0    Fall Risk    05/07/2023    9:46 AM 01/15/2023    7:56 AM 07/18/2022    8:00 AM 05/04/2022    8:26 AM 04/04/2022    8:25 AM  Fall Risk   Falls in the past year? 0 0 0 0 0  Number falls in past yr: 0 0 0 0 0  Injury with Fall? 0 0 0 0 0  Risk for fall due to : No Fall Risks   No Fall Risks   Follow up Falls prevention discussed;Falls evaluation completed Falls evaluation completed Falls evaluation completed Falls evaluation completed Falls evaluation completed    MEDICARE RISK AT HOME: Medicare Risk at Home Any stairs in or around the home?: Yes If so, are there any without handrails?: No Home free of loose throw rugs in walkways, pet beds, electrical cords, etc?: Yes Adequate lighting in your home to reduce risk of falls?: Yes Life alert?: No Use of a cane, walker or w/c?: No Grab bars in the bathroom?: Yes Shower chair or bench in shower?: Yes Elevated toilet seat or a handicapped toilet?: No  TIMED UP AND GO:  Was the test performed?  No    Cognitive Function:    11/11/2015    9:54 AM  MMSE - Mini Mental State Exam  Orientation to time 5  Orientation to Place 5   Registration 3  Attention/ Calculation 5  Recall 3  Language- name 2 objects 2  Language- repeat 1  Language- follow 3 step command 3  Language- read & follow direction 1  Write a sentence 1  Copy design 1  Total score 30        05/07/2023    9:47 AM 05/04/2022    8:39 AM  6CIT Screen  What Year? 0 points 0 points  What month? 0 points 0 points  What time? 0 points 0 points  Count back from 20 0 points 0 points  Months in reverse 0 points 0 points  Repeat phrase 0 points 4 points  Total Score 0 points 4 points    Immunizations Immunization History  Administered Date(s) Administered   Fluad Quad(high Dose 65+) 12/22/2018, 12/20/2020, 01/10/2022   Fluad Trivalent(High Dose 65+) 01/15/2023   H1N1 03/31/2008   Influenza Split 01/22/2011, 01/10/2012   Influenza Whole 01/17/2007, 12/24/2007, 11/17/2009   Influenza, High Dose Seasonal PF 02/23/2015, 01/13/2016,  01/02/2017, 01/16/2018, 03/27/2021   Influenza,inj,Quad PF,6+ Mos 01/20/2013, 03/08/2014, 12/23/2019   PFIZER(Purple Top)SARS-COV-2 Vaccination 05/21/2019, 07/13/2019, 06/01/2020   Pneumococcal Conjugate-13 09/03/2014   Pneumococcal Polysaccharide-23 09/01/2013, 03/27/2021   Td 03/19/2001   Tdap 08/22/2011, 04/04/2022   Zoster, Live 08/22/2011    TDAP status: Up to date  Flu Vaccine status: Up to date  Pneumococcal vaccine status: Up to date    Qualifies for Shingles Vaccine? Yes   Zostavax completed No   Shingrix Completed?: No.    Education has been provided regarding the importance of this vaccine. Patient has been advised to call insurance company to determine out of pocket expense if they have not yet received this vaccine. Advised may also receive vaccine at local pharmacy or Health Dept. Verbalized acceptance and understanding.  Screening Tests Health Maintenance  Topic Date Due   Zoster Vaccines- Shingrix (1 of 2) 04[birth year]03/1967   Colonoscopy  01/15/2024 (Originally 02/19/2020)   Medicare Annual  Wellness (AWV)  05/06/2024   DTaP/Tdap/Td (4 - Td or Tdap) 04/04/2032   Pneumonia Vaccine 60+ Years old  Completed   INFLUENZA VACCINE  Completed   Hepatitis C Screening  Completed   HPV VACCINES  Aged Out   COVID-19 Vaccine  Discontinued    Health Maintenance  Health Maintenance Due  Topic Date Due   Zoster Vaccines- Shingrix (1 of 2) [birth year]03[birth year]1967    Colorectal cancer screening: Type of screening: Colonoscopy. Completed Scheduled for 05/13/23. Repeat every 3 years    Additional Screening:  Hepatitis C Screening: does qualify; Completed 02/23/15  Vision Screening: Recommended annual ophthalmology exams for early detection of glaucoma and other disorders of the eye. Is the patient up to date with their annual eye exam?  Yes  Who is the provider or what is the name of the office in which the patient attends annual eye exams? [facility]. If pt is not established with a provider, would they like to be referred to a provider to establish care? No .   Dental Screening: Recommended annual dental exams for proper oral hygiene    Community Resource Referral / Chronic Care Management:  CRR required this visit?  No   CCM required this visit?  No     Plan:     I have personally reviewed and noted the following in the patient's chart:   Medical and social history Use of alcohol, tobacco or illicit drugs  Current medications and supplements including opioid prescriptions. Patient is not currently taking opioid prescriptions. Functional ability and status Nutritional status Physical activity Advanced directives List of other physicians Hospitalizations, surgeries, and ER visits in previous 12 months Vitals Screenings to include cognitive, depression, and falls Referrals and appointments  In addition, I have reviewed and discussed with patient certain preventive protocols, quality metrics, and best practice recommendations. A written personalized care plan for preventive  services as well as general preventive health recommendations were provided to patient.     Tillie Rung, LPN   1/61/0960   After Visit Summary: (MyChart) Due to this being a telephonic visit, the after visit summary with patients personalized plan was offered to patient via MyChart   Nurse Notes: None

## 2023-05-09 ENCOUNTER — Encounter: Payer: Self-pay | Admitting: Gastroenterology

## 2023-05-13 ENCOUNTER — Ambulatory Visit: Payer: Medicare (Managed Care) | Admitting: Gastroenterology

## 2023-05-13 ENCOUNTER — Encounter: Payer: Self-pay | Admitting: Gastroenterology

## 2023-05-13 VITALS — BP 139/70 | HR 56 | Temp 97.9°F | Resp 13 | Ht 71.0 in | Wt 226.5 lb

## 2023-05-13 DIAGNOSIS — K648 Other hemorrhoids: Secondary | ICD-10-CM

## 2023-05-13 DIAGNOSIS — D123 Benign neoplasm of transverse colon: Secondary | ICD-10-CM | POA: Diagnosis not present

## 2023-05-13 DIAGNOSIS — Z860101 Personal history of adenomatous and serrated colon polyps: Secondary | ICD-10-CM

## 2023-05-13 DIAGNOSIS — Z1211 Encounter for screening for malignant neoplasm of colon: Secondary | ICD-10-CM | POA: Diagnosis not present

## 2023-05-13 DIAGNOSIS — Z860100 Personal history of colon polyps, unspecified: Secondary | ICD-10-CM

## 2023-05-13 DIAGNOSIS — E119 Type 2 diabetes mellitus without complications: Secondary | ICD-10-CM | POA: Diagnosis not present

## 2023-05-13 DIAGNOSIS — D122 Benign neoplasm of ascending colon: Secondary | ICD-10-CM

## 2023-05-13 DIAGNOSIS — I10 Essential (primary) hypertension: Secondary | ICD-10-CM | POA: Diagnosis not present

## 2023-05-13 MED ORDER — SODIUM CHLORIDE 0.9 % IV SOLN
500.0000 mL | Freq: Once | INTRAVENOUS | Status: DC
Start: 2023-05-13 — End: 2023-05-13

## 2023-05-13 NOTE — Progress Notes (Signed)
 [facility] History and Physical   Primary Care Physician:  Kirk Plump, MD   Reason for Procedure:   History of colon polyps  Plan:    colonoscopy     HPI: Kirk Sutton is a 73 y.o. male  here for colonoscopy surveillance. Last exam 02/2017 - 1 adenoma and 3 SSPs, told to repeat in 3 years.  . Patient denies any bowel symptoms at this time. No family history of colon cancer known. Otherwise feels well without any cardiopulmonary symptoms.   I have discussed risks / benefits of anesthesia and endoscopic procedure with Kirk Sutton and they wish to proceed with the exams as outlined today.    Past Medical History:  Diagnosis Date   Allergic rhinitis    Asthma    Chronic prostatitis    sees urology   Diabetes mellitus    type 11 (a1c 6.0 03/2008)/no meds.   Hernia, umbilical    Hyperlipidemia    Hypertension    Psoriasis    @ hands, sees derm    Past Surgical History:  Procedure Laterality Date   APPENDECTOMY     FERTILITY SURGERY  03/19/1977   Spermatic Vein Ligation   HERNIA REPAIR     1950s   NASAL SEPTUM SURGERY  03/20/2007   SHOULDER SURGERY  03/19/2006   left   TESTICLE SURGERY     undescended repair    UMBILICAL HERNIA REPAIR  08/28/2021   VASECTOMY      Prior to Admission medications   Medication Sig Start Date End Date Taking? Authorizing Provider  acetaminophen (TYLENOL) 650 MG CR tablet Take 650 mg by mouth every 8 (eight) hours as needed for pain.   Yes [provider]  ALPRAZolam Prudy Feeler) 0.5 MG tablet Take 0.5 mg by mouth 2 (two) times daily as needed. 02/12/23  Yes [provider]  amphetamine-dextroamphetamine (ADDERALL) 15 MG tablet Take 15 mg by mouth in the morning and at bedtime. In morning and at noon   Yes [provider]  Ascorbic Acid (VITAMIN C) 1000 MG tablet Take 1,000 mg by mouth daily.   Yes [provider]  atorvastatin (LIPITOR) 20 MG tablet Take 1 tablet (20 mg total) by mouth at  bedtime. 04/15/23  Yes Sutton, Kirk Rod, MD  b complex vitamins capsule Take 1 capsule by mouth daily. 08/22/21  Yes [provider]  Coenzyme Q10 100 MG capsule Take 100 mg by mouth daily.   Yes [provider]  desloratadine (CLARINEX) 5 MG tablet Take 10 mg by mouth daily.   Yes [provider]  FLUoxetine (PROZAC) 40 MG capsule Take 1 tablet daily 11/03/14  Yes [provider]  fluticasone-salmeterol (ADVAIR HFA) 115-21 MCG/ACT inhaler Inhale 2 puffs into the lungs in the morning and at bedtime.   Yes [provider]  hydrochlorothiazide (HYDRODIURIL) 25 MG tablet Take 1 tablet (25 mg total) by mouth daily. 03/27/23  Yes Sutton, Kirk Rod, MD  ibuprofen (ADVIL) 200 MG tablet Take 200 mg by mouth every 6 (six) hours as needed.   Yes [provider]  Magnesium Gluconate 250 MG TABS Take 400 mg by mouth daily.   Yes [provider]  metoprolol succinate (TOPROL-XL) 50 MG 24 hr tablet Take 1 tablet (50 mg total) by mouth daily. Take with or immediately following a meal 11/26/22  Yes Sutton, Kirk E, MD  montelukast (SINGULAIR) 10 MG tablet TAKE 1 TABLET(10 MG) BY MOUTH AT BEDTIME 02/01/21  Yes Sutton,  Kirk Rod, MD  Psyllium (METAMUCIL PO) Take 90 mg by mouth daily.   Yes [provider]  pyridOXINE (VITAMIN B-6) 100 MG tablet Take 400 mg by mouth daily. Super B complex   Yes [provider]  terazosin (HYTRIN) 1 MG capsule Take 3 capsules (3 mg total) by mouth daily. 12/31/22  Yes Sutton, Kirk Rod, MD  Turmeric 500 MG CAPS Take 1,000 mg by mouth daily.   Yes [provider]  Vitamin D, Cholecalciferol, 25 MCG (1000 UT) CAPS Take 1 capsule by mouth daily.   Yes [provider]  albuterol (VENTOLIN HFA) 108 (90 Base) MCG/ACT inhaler Inhale 2 puffs into the lungs every 6 (six) hours as needed for wheezing or shortness of breath. 03/07/22   Saguier, Ramon Dredge, PA-C  azelastine (ASTELIN) 0.1 % nasal spray Place 1 spray into both nostrils  every evening.    [provider]  benzonatate (TESSALON) 200 MG capsule Take 1 capsule (200 mg total) by mouth 3 (three) times daily as needed for cough. 04/29/23   Kirk Plump, MD  clobetasol ointment (TEMOVATE) 0.05 % Apply 1 application topically 2 (two) times daily as needed. For hands    [provider]  cycloSPORINE (RESTASIS) 0.05 % ophthalmic emulsion 1 drop 2 (two) times daily. Patient not taking: Reported on 03/11/2023    [provider]  fluticasone (FLONASE) 50 MCG/ACT nasal spray Place 2 sprays into both nostrils daily. 02/18/18   [provider]    Current Outpatient Medications  Medication Sig Dispense Refill   acetaminophen (TYLENOL) 650 MG CR tablet Take 650 mg by mouth every 8 (eight) hours as needed for pain.     ALPRAZolam (XANAX) 0.5 MG tablet Take 0.5 mg by mouth 2 (two) times daily as needed.     amphetamine-dextroamphetamine (ADDERALL) 15 MG tablet Take 15 mg by mouth in the morning and at bedtime. In morning and at noon     Ascorbic Acid (VITAMIN C) 1000 MG tablet Take 1,000 mg by mouth daily.     atorvastatin (LIPITOR) 20 MG tablet Take 1 tablet (20 mg total) by mouth at bedtime. 90 tablet 1   b complex vitamins capsule Take 1 capsule by mouth daily.     Coenzyme Q10 100 MG capsule Take 100 mg by mouth daily.     desloratadine (CLARINEX) 5 MG tablet Take 10 mg by mouth daily.     FLUoxetine (PROZAC) 40 MG capsule Take 1 tablet daily  2   fluticasone-salmeterol (ADVAIR HFA) 115-21 MCG/ACT inhaler Inhale 2 puffs into the lungs in the morning and at bedtime.     hydrochlorothiazide (HYDRODIURIL) 25 MG tablet Take 1 tablet (25 mg total) by mouth daily. 90 tablet 1   ibuprofen (ADVIL) 200 MG tablet Take 200 mg by mouth every 6 (six) hours as needed.     Magnesium Gluconate 250 MG TABS Take 400 mg by mouth daily.     metoprolol succinate (TOPROL-XL) 50 MG 24 hr tablet Take 1 tablet (50 mg total) by mouth daily. Take with or immediately  following a meal 90 tablet 1   montelukast (SINGULAIR) 10 MG tablet TAKE 1 TABLET(10 MG) BY MOUTH AT BEDTIME 90 tablet 1   Psyllium (METAMUCIL PO) Take 90 mg by mouth daily.     pyridOXINE (VITAMIN B-6) 100 MG tablet Take 400 mg by mouth daily. Super B complex     terazosin (HYTRIN) 1 MG capsule Take 3 capsules (3 mg total) by mouth daily. 270  capsule 1   Turmeric 500 MG CAPS Take 1,000 mg by mouth daily.     Vitamin D, Cholecalciferol, 25 MCG (1000 UT) CAPS Take 1 capsule by mouth daily.     albuterol (VENTOLIN HFA) 108 (90 Base) MCG/ACT inhaler Inhale 2 puffs into the lungs every 6 (six) hours as needed for wheezing or shortness of breath. 18 g 5   azelastine (ASTELIN) 0.1 % nasal spray Place 1 spray into both nostrils every evening.     benzonatate (TESSALON) 200 MG capsule Take 1 capsule (200 mg total) by mouth 3 (three) times daily as needed for cough. 20 capsule 0   clobetasol ointment (TEMOVATE) 0.05 % Apply 1 application topically 2 (two) times daily as needed. For hands     cycloSPORINE (RESTASIS) 0.05 % ophthalmic emulsion 1 drop 2 (two) times daily. (Patient not taking: Reported on 03/11/2023)     fluticasone (FLONASE) 50 MCG/ACT nasal spray Place 2 sprays into both nostrils daily.     Current Facility-Administered Medications  Medication Dose Route Frequency Provider Last Rate Last Admin   0.9 %  sodium chloride infusion  500 mL Intravenous Once Magdaline Zollars, Willaim Rayas, MD        Allergies as of 05/13/2023 - Review Complete 05/13/2023  Allergen Reaction Noted   Cetirizine hcl  08/14/2007   Fexofenadine  10/17/2006    Family History  Problem Relation Age of Onset   Hypertension Mother    Colon polyps Mother    Heart attack Father        x 3 onset age 30, died at 26   Colon polyps Father    Cancer Father        bladder   Diabetes Sister    Hypertension Sister    Colon cancer Neg Hx    Prostate cancer Neg Hx    Stomach cancer Neg Hx    Esophageal cancer Neg Hx    Rectal  cancer Neg Hx     Social History   Socioeconomic History   Marital status: Married    Spouse name: Not on file   Number of children: 2   Years of education: Not on file   Highest education level: Not on file  Occupational History   Occupation: Diplomatic Services operational officer  Tobacco Use   Smoking status: Never   Smokeless tobacco: Never  Vaping Use   Vaping status: Never Used  Substance and Sexual Activity   Alcohol use: Yes    Comment: socially, rare   Drug use: No   Sexual activity: Yes  Other Topics Concern   Not on file  Social History Narrative   Household-- pt, wife    Social Drivers of Corporate investment banker Strain: Low Risk  (05/07/2023)   Overall Financial Resource Strain (CARDIA)    Difficulty of Paying Living Expenses: Not hard at all  Food Insecurity: No Food Insecurity (05/07/2023)   Hunger Vital Sign    Worried About Running Out of Food in the Last Year: Never true    Ran Out of Food in the Last Year: Never true  Transportation Needs: No Transportation Needs (05/07/2023)   PRAPARE - Administrator, Civil Service (Medical): No    Lack of Transportation (Non-Medical): No  Physical Activity: Sufficiently Active (05/07/2023)   Exercise Vital Sign    Days of Exercise per Week: 7 days    Minutes of Exercise per Session: 60 min  Stress: No Stress Concern Present (05/07/2023)  [employer] of Occupational Health - Occupational Stress Questionnaire    Feeling of Stress : Not at all  Social Connections: Socially Integrated (05/07/2023)   Social Connection and Isolation Panel [NHANES]    Frequency of Communication with Friends and Family: More than three times a week    Frequency of Social Gatherings with Friends and Family: More than three times a week    Attends Religious Services: More than 4 times per year    Active Member of [employer] or Organizations: Yes    Attends Engineer, structural: More than 4 times per year     Marital Status: Married  Catering manager Violence: Not At Risk (05/07/2023)   Humiliation, Afraid, Rape, and Kick questionnaire    Fear of Current or Ex-Partner: No    Emotionally Abused: No    Physically Abused: No    Sexually Abused: No    Review of Systems: All other review of systems negative except as mentioned in the HPI.  Physical Exam: Vital signs BP 121/75   Pulse (!) 55   Temp 97.9 F (36.6 C) (Temporal)   Ht 5\' 11"  (1.803 m)   Wt 226 lb 8 oz (102.7 kg)   SpO2 97%   BMI 31.59 kg/m   General:   Alert,  Well-developed, pleasant and cooperative in NAD Lungs:  Clear throughout to auscultation.   Heart:  Regular rate and rhythm Abdomen:  Soft, nontender and nondistended.   Neuro/Psych:  Alert and cooperative. Normal mood and affect. A and O x 3  Harlin Rain, MD [facility]

## 2023-05-13 NOTE — Op Note (Signed)
 [facility] Patient Name: Kirk Sutton Procedure Date: 05/13/2023 2:03 PM MRN: [id number] Endoscopist: Viviann Spare P. Adela Lank , MD, [id number] Age: 73 Referring MD:  Date of Birth: 04/15/1950 Gender: Male Account #: [id number] Procedure:                Colonoscopy Indications:              High risk colon cancer surveillance: Personal                            history of colonic polyps - 4 polyps removed                            02/2017 - 1 TA, 3 SSPs Medicines:                Monitored Anesthesia Care Procedure:                Pre-Anesthesia Assessment:                           - Prior to the procedure, a History and Physical                            was performed, and patient medications and                            allergies were reviewed. The patient's tolerance of                            previous anesthesia was also reviewed. The risks                            and benefits of the procedure and the sedation                            options and risks were discussed with the patient.                            All questions were answered, and informed consent                            was obtained. Prior Anticoagulants: The patient has                            taken no anticoagulant or antiplatelet agents. ASA                            Grade Assessment: II - A patient with mild systemic                            disease. After reviewing the risks and benefits,                            the patient was deemed in satisfactory condition to  undergo the procedure.                           After obtaining informed consent, the colonoscope                            was passed under direct vision. Throughout the                            procedure, the patient's blood pressure, pulse, and                            oxygen saturations were monitored continuously. The                            CF [id number] #[id number] was introduced through  the anus                            and advanced to the the cecum, identified by                            appendiceal orifice and ileocecal valve. The                            colonoscopy was performed without difficulty. The                            patient tolerated the procedure well. The quality                            of the bowel preparation was adequate. The                            ileocecal valve, appendiceal orifice, and rectum                            were photographed. Scope In: 2:07:25 PM Scope Out: 2:24:38 PM Scope Withdrawal Time: 0 hours 14 minutes 9 seconds  Total Procedure Duration: 0 hours 17 minutes 13 seconds  Findings:                 The perianal and digital rectal examinations were                            normal.                           A 3 mm polyp was found in the ascending colon. The                            polyp was sessile. The polyp was removed with a                            cold snare. Resection and retrieval were complete.  Two flat and sessile polyps were found in the                            hepatic flexure. The polyps were 3 to 4 mm in size.                            These polyps were removed with a cold snare.                            Resection and retrieval were complete.                           Four flat and sessile polyps were found in the                            transverse colon. The polyps were 3 to 4 mm in                            size. These polyps were removed with a cold snare.                            Resection and retrieval were complete.                           Internal hemorrhoids were found during retroflexion.                           The exam was otherwise without abnormality. Complications:            No immediate complications. Estimated blood loss:                            Minimal. Estimated Blood Loss:     Estimated blood loss was minimal. Impression:                - One 3 mm polyp in the ascending colon, removed                            with a cold snare. Resected and retrieved.                           - Two 3 to 4 mm polyps at the hepatic flexure,                            removed with a cold snare. Resected and retrieved.                           - Four 3 to 4 mm polyps in the transverse colon,                            removed with a cold snare. Resected and retrieved.                           -  Internal hemorrhoids.                           - The examination was otherwise normal. Recommendation:           - Patient has a contact number available for                            emergencies. The signs and symptoms of potential                            delayed complications were discussed with the                            patient. Return to normal activities tomorrow.                            Written discharge instructions were provided to the                            patient.                           - Resume previous diet.                           - Continue present medications.                           - Await pathology results. Anticipate repeat                            colonoscopy in 3 years. Viviann Spare P. Adela Lank, MD 05/13/2023 2:30:18 PM This report has been signed electronically.

## 2023-05-13 NOTE — Progress Notes (Signed)
 Pt resting comfortably. VSS. Airway intact. SBAR complete to RN. All questions answered.

## 2023-05-13 NOTE — Patient Instructions (Signed)
 Educational handout provided to patient related to Polyps and Diverticulosis  Resume previous diet  Continue present medications  Awaiting pathology results  YOU HAD AN ENDOSCOPIC PROCEDURE TODAY AT THE [facility]:   Refer to the procedure report that was given to you for any specific questions about what was found during the examination.  If the procedure report does not answer your questions, please call your gastroenterologist to clarify.  If you requested that your care partner not be given the details of your procedure findings, then the procedure report has been included in a sealed envelope for you to review at your convenience later.  YOU SHOULD EXPECT: Some feelings of bloating in the abdomen. Passage of more gas than usual.  Walking can help get rid of the air that was put into your GI tract during the procedure and reduce the bloating. If you had a lower endoscopy (such as a colonoscopy or flexible sigmoidoscopy) you may notice spotting of blood in your stool or on the toilet paper. If you underwent a bowel prep for your procedure, you may not have a normal bowel movement for a few days.  Please Note:  You might notice some irritation and congestion in your nose or some drainage.  This is from the oxygen used during your procedure.  There is no need for concern and it should clear up in a day or so.  SYMPTOMS TO REPORT IMMEDIATELY:  Following lower endoscopy (colonoscopy or flexible sigmoidoscopy):  Excessive amounts of blood in the stool  Significant tenderness or worsening of abdominal pains  Swelling of the abdomen that is new, acute  Fever of 100F or higher  For urgent or emergent issues, a gastroenterologist can be reached at any hour by calling [phone]. Do not use MyChart messaging for urgent concerns.    DIET:  We do recommend a small meal at first, but then you may proceed to your regular diet.  Drink plenty of fluids but you should avoid alcoholic  beverages for 24 hours.  ACTIVITY:  You should plan to take it easy for the rest of today and you should NOT DRIVE or use heavy machinery until tomorrow (because of the sedation medicines used during the test).    FOLLOW UP: Our staff will call the number listed on your records the next business day following your procedure.  We will call around 7:15- 8:00 am to check on you and address any questions or concerns that you may have regarding the information given to you following your procedure. If we do not reach you, we will leave a message.     If any biopsies were taken you will be contacted by phone or by letter within the next 1-3 weeks.  Please call us at [phone] if you have not heard about the biopsies in 3 weeks.    SIGNATURES/CONFIDENTIALITY: You and/or your care partner have signed paperwork which will be entered into your electronic medical record.  These signatures attest to the fact that that the information above on your After Visit Summary has been reviewed and is understood.  Full responsibility of the confidentiality of this discharge information lies with you and/or your care-partner.

## 2023-05-13 NOTE — Progress Notes (Signed)
 Pt's states no medical or surgical changes since previsit or office visit.

## 2023-05-14 ENCOUNTER — Telehealth: Payer: Self-pay

## 2023-05-14 NOTE — Telephone Encounter (Signed)
  Follow up Call-     05/13/2023    1:06 PM  Call back number  Post procedure Call Back phone  # [phone]  Permission to leave phone message Yes     Patient questions:  Do you have a fever, pain , or abdominal swelling? No. Pain Score  0 *  Have you tolerated food without any problems? Yes.    Have you been able to return to your normal activities? Yes.    Do you have any questions about your discharge instructions: Diet   No. Medications  No. Follow up visit  No.  Do you have questions or concerns about your Care? No.  Actions: * If pain score is 4 or above: No action needed, pain <4.

## 2023-05-16 ENCOUNTER — Encounter: Payer: Self-pay | Admitting: Gastroenterology

## 2023-05-16 LAB — SURGICAL PATHOLOGY

## 2023-05-29 ENCOUNTER — Ambulatory Visit: Payer: Medicare (Managed Care) | Admitting: Internal Medicine

## 2023-06-20 ENCOUNTER — Ambulatory Visit: Payer: Medicare (Managed Care) | Admitting: Medical

## 2023-06-20 VITALS — BP 140/72 | HR 58 | Temp 97.8°F | Resp 18 | Ht 71.0 in | Wt 237.0 lb

## 2023-06-20 DIAGNOSIS — R059 Cough, unspecified: Secondary | ICD-10-CM

## 2023-06-20 DIAGNOSIS — R252 Cramp and spasm: Secondary | ICD-10-CM

## 2023-06-20 DIAGNOSIS — J302 Other seasonal allergic rhinitis: Secondary | ICD-10-CM

## 2023-06-20 DIAGNOSIS — R0981 Nasal congestion: Secondary | ICD-10-CM | POA: Diagnosis not present

## 2023-06-20 DIAGNOSIS — R6883 Chills (without fever): Secondary | ICD-10-CM | POA: Diagnosis not present

## 2023-06-20 DIAGNOSIS — R0982 Postnasal drip: Secondary | ICD-10-CM | POA: Diagnosis not present

## 2023-06-20 LAB — POC COVID19 BINAXNOW: SARS Coronavirus 2 Ag: NEGATIVE

## 2023-06-20 LAB — POCT INFLUENZA A/B
Influenza A, POC: NEGATIVE
Influenza B, POC: NEGATIVE

## 2023-06-20 MED ORDER — AZITHROMYCIN 250 MG PO TABS: ORAL_TABLET | ORAL | 0 refills | Status: AC

## 2023-06-20 MED ORDER — METHYLPREDNISOLONE 4 MG PO TABS: ORAL_TABLET | ORAL | 0 refills | Status: DC

## 2023-06-20 NOTE — Progress Notes (Signed)
 Subjective:    Patient ID: Kirk Sutton, male    DOB: [birth year], 73 y.o.   MRN: [id number]  HPI  Kirk Sutton is a 73 year old male with allergies and asthma who presents with cough and chest discomfort.  He has been experiencing a worsening cough that began last night and persisted into this morning. The cough is mostly dry, with some productive episodes in the morning. He has been using Mucinex DM and Tylenol to manage his symptoms. He also reports wheezing and a sensation of  lung tightness, particularly when trying to take a deep breath. Wheezing has been present, and he restarted his albuterol inhaler as of yesterday. No fever, but he experienced chills and sweating last night.  He has a history of allergies and asthma, for which he uses multiple medications including montelukast at night, loratadine in the morning, and Astelin nasal spray. He also uses Advair for asthma management. Nasal congestion and drainage have improved with Mucinex DM. He uses allergy eye drops due to contact lens use and allergies. He mentions having used Flonase in the past but is not currently using it.  He recently returned from a trip to [place], where he experienced different weather conditions, which he believes may have contributed to his symptoms. He received a flu vaccine last November.  He mentions occasional cramping in his calves and hands for years. He declines labs today for cramping.      Review of Systems  Constitutional:  Positive for chills. Negative for fatigue.       Last night.  HENT:  Positive for congestion and postnasal drip. Negative for sinus pressure and sinus pain.   Respiratory:  Positive for cough and wheezing. Negative for chest tightness.   Cardiovascular:  Negative for chest pain and palpitations.  Gastrointestinal:  Negative for abdominal pain, constipation, nausea and vomiting.  Genitourinary:  Negative for dysuria, flank pain and frequency.  Neurological:  Negative  for dizziness, syncope, weakness, light-headedness and numbness.  Hematological:  Negative for adenopathy. Does not bruise/bleed easily.   Past Medical History:  Diagnosis Date   Allergic rhinitis    Asthma    Chronic prostatitis    sees urology   Diabetes mellitus    type 11 (a1c 6.0 03/2008)/no meds.   Hernia, umbilical    Hyperlipidemia    Hypertension    Psoriasis    @ hands, sees derm     Social History   Socioeconomic History   Marital status: Married    Spouse name: Not on file   Number of children: 2   Years of education: Not on file   Highest education level: Not on file  Occupational History   Occupation: Diplomatic Services operational officer  Tobacco Use   Smoking status: Never   Smokeless tobacco: Never  Vaping Use   Vaping status: Never Used  Substance and Sexual Activity   Alcohol use: Yes    Comment: socially, rare   Drug use: No   Sexual activity: Yes  Other Topics Concern   Not on file  Social History Narrative   Household-- pt, wife    Social Drivers of Corporate investment banker Strain: Low Risk  (05/07/2023)   Overall Financial Resource Strain (CARDIA)    Difficulty of Paying Living Expenses: Not hard at all  Food Insecurity: No Food Insecurity (05/07/2023)   Hunger Vital Sign    Worried About Running Out of Food in the Last Year: Never true  Ran Out of Food in the Last Year: Never true  Transportation Needs: No Transportation Needs (05/07/2023)   PRAPARE - Administrator, Civil Service (Medical): No    Lack of Transportation (Non-Medical): No  Physical Activity: Sufficiently Active (05/07/2023)   Exercise Vital Sign    Days of Exercise per Week: 7 days    Minutes of Exercise per Session: 60 min  Stress: No Stress Concern Present (05/07/2023)   [employer] of Occupational Health - Occupational Stress Questionnaire    Feeling of Stress : Not at all  Social Connections: Socially Integrated (05/07/2023)   Social  Connection and Isolation Panel [NHANES]    Frequency of Communication with Friends and Family: More than three times a week    Frequency of Social Gatherings with Friends and Family: More than three times a week    Attends Religious Services: More than 4 times per year    Active Member of [employer] or Organizations: Yes    Attends Engineer, structural: More than 4 times per year    Marital Status: Married  Catering manager Violence: Not At Risk (05/07/2023)   Humiliation, Afraid, Rape, and Kick questionnaire    Fear of Current or Ex-Partner: No    Emotionally Abused: No    Physically Abused: No    Sexually Abused: No    Past Surgical History:  Procedure Laterality Date   APPENDECTOMY     FERTILITY SURGERY  03/19/1977   Spermatic Vein Ligation   HERNIA REPAIR     1950s   NASAL SEPTUM SURGERY  03/20/2007   SHOULDER SURGERY  03/19/2006   left   TESTICLE SURGERY     undescended repair    UMBILICAL HERNIA REPAIR  08/28/2021   VASECTOMY      Family History  Problem Relation Age of Onset   Hypertension Mother    Colon polyps Mother    Heart attack Father        x 3 onset age 36, died at 77   Colon polyps Father    Cancer Father        bladder   Diabetes Sister    Hypertension Sister    Colon cancer Neg Hx    Prostate cancer Neg Hx    Stomach cancer Neg Hx    Esophageal cancer Neg Hx    Rectal cancer Neg Hx     Allergies  Allergen Reactions   Cetirizine Hcl     REACTION: prostatitis   Fexofenadine     REACTION: causes prostatitis    Current Outpatient Medications on File Prior to Visit  Medication Sig Dispense Refill   acetaminophen (TYLENOL) 650 MG CR tablet Take 650 mg by mouth every 8 (eight) hours as needed for pain.     albuterol (VENTOLIN HFA) 108 (90 Base) MCG/ACT inhaler Inhale 2 puffs into the lungs every 6 (six) hours as needed for wheezing or shortness of breath. 18 g 5   ALPRAZolam (XANAX) 0.5 MG tablet Take 0.5 mg by mouth 2 (two) times daily as  needed.     amphetamine-dextroamphetamine (ADDERALL) 15 MG tablet Take 15 mg by mouth in the morning and at bedtime. In morning and at noon     Ascorbic Acid (VITAMIN C) 1000 MG tablet Take 1,000 mg by mouth daily.     atorvastatin (LIPITOR) 20 MG tablet Take 1 tablet (20 mg total) by mouth at bedtime. 90 tablet 1   azelastine (ASTELIN) 0.1 % nasal spray Place 1 spray  into both nostrils every evening.     b complex vitamins capsule Take 1 capsule by mouth daily.     benzonatate (TESSALON) 200 MG capsule Take 1 capsule (200 mg total) by mouth 3 (three) times daily as needed for cough. 20 capsule 0   clobetasol ointment (TEMOVATE) 0.05 % Apply 1 application topically 2 (two) times daily as needed. For hands     Coenzyme Q10 100 MG capsule Take 100 mg by mouth daily.     cycloSPORINE (RESTASIS) 0.05 % ophthalmic emulsion 1 drop 2 (two) times daily. (Patient not taking: Reported on 03/11/2023)     desloratadine (CLARINEX) 5 MG tablet Take 10 mg by mouth daily.     FLUoxetine (PROZAC) 40 MG capsule Take 1 tablet daily  2   fluticasone (FLONASE) 50 MCG/ACT nasal spray Place 2 sprays into both nostrils daily.     fluticasone-salmeterol (ADVAIR HFA) 115-21 MCG/ACT inhaler Inhale 2 puffs into the lungs in the morning and at bedtime.     hydrochlorothiazide (HYDRODIURIL) 25 MG tablet Take 1 tablet (25 mg total) by mouth daily. 90 tablet 1   ibuprofen (ADVIL) 200 MG tablet Take 200 mg by mouth every 6 (six) hours as needed.     Magnesium Gluconate 250 MG TABS Take 400 mg by mouth daily.     metoprolol succinate (TOPROL-XL) 50 MG 24 hr tablet Take 1 tablet (50 mg total) by mouth daily. Take with or immediately following a meal 90 tablet 1   montelukast (SINGULAIR) 10 MG tablet TAKE 1 TABLET(10 MG) BY MOUTH AT BEDTIME 90 tablet 1   Psyllium (METAMUCIL PO) Take 90 mg by mouth daily.     pyridOXINE (VITAMIN B-6) 100 MG tablet Take 400 mg by mouth daily. Super B complex     terazosin (HYTRIN) 1 MG capsule Take  3 capsules (3 mg total) by mouth daily. 270 capsule 1   Turmeric 500 MG CAPS Take 1,000 mg by mouth daily.     Vitamin D, Cholecalciferol, 25 MCG (1000 UT) CAPS Take 1 capsule by mouth daily.     No current facility-administered medications on file prior to visit.    BP (!) 140/72   Pulse (!) 58   Temp 97.8 F (36.6 C)   Resp 18   Ht 5\' 11"  (1.803 m)   Wt 237 lb (107.5 kg)   SpO2 96%   BMI 33.05 kg/m        Objective:   Physical Exam  General Mental Status- Alert. General Appearance- Not in acute distress.   Skin General: Color- Normal Color. Moisture- Normal Moisture.  Neck No JVD. No tracheal deviation.  Chest and Lung Exam Auscultation: Breath Sounds: clear even and unlabored  Cardiovascular Auscultation:Rythm-rrr  Murmurs & Other Heart Sounds:Auscultation of the heart reveals- No Murmurs.  Abdomen Inspection:-Inspeection Normal. Palpation/Percussion:Note:No mass. Palpation and Percussion of the abdomen reveal- Non Tender, Non Distended + BS, no rebound or guarding.   Neurologic Cranial Nerve exam:- CN III-XII intact(No nystagmus), symmetric smile. Strength:- 5/5 equal and symmetric strength both upper and lower extremities.   Lower ext- no pedal edema. Calfs symmetric, negative homans signa.    Assessment & Plan:   Patient Instructions  Allergic Rhinitis with Bronchitis Allergic rhinitis and asthma with symptoms of early bronchitis post-allergy flare. Current medications insufficient. Medrol benefits vs risk reviewed. Think would be needed presently.. Discussed potential antibiotic use if symptoms worsen. - Rx Medrol 4 mg 6 day taper dose. - Perform COVID-19 and flu tests. both  negative - Prescribe azithromycin (Z-Pak) for use if symptoms worsen over the weekend. - Continue montelukast, Astelin nasal spray, and loratadine. - Use benzonatate as needed for cough. - Advise follow-up in 7-10 days if symptoms persist or sooner if symptoms worsen.  Muscle  Cramping Intermittent cramping in calves and hands, stable. Plan to assess for electrolyte deficiencies at follow-up. you deferred until appointment with Dr. Drue Novel later this month  Follow up 7-10 days or sooner if needed    Esperanza Richters, PA-C

## 2023-06-20 NOTE — Patient Instructions (Signed)
 Allergic Rhinitis with Bronchitis Allergic rhinitis and asthma with symptoms of early bronchitis post-allergy flare. Current medications insufficient. Medrol benefits vs risk reviewed. Think would be needed presently.. Discussed potential antibiotic use if symptoms worsen. - Rx Medrol 4 mg 6 day taper dose. - Perform COVID-19 and flu tests. both negative - Prescribe azithromycin (Z-Pak) for use if symptoms worsen over the weekend. - Continue montelukast, Astelin nasal spray, and loratadine. - Use benzonatate as needed for cough. - Advise follow-up in 7-10 days if symptoms persist or sooner if symptoms worsen.  Muscle Cramping Intermittent cramping in calves and hands, stable. Plan to assess for electrolyte deficiencies at follow-up. you deferred until appointment with Dr. Drue Novel later this month  Follow up 7-10 days or sooner if needed

## 2023-06-23 ENCOUNTER — Other Ambulatory Visit: Payer: Self-pay | Admitting: Internal Medicine

## 2023-06-25 ENCOUNTER — Ambulatory Visit: Payer: Self-pay

## 2023-06-25 NOTE — Telephone Encounter (Signed)
 Chief Complaint: cough Symptoms: lingering cough Frequency: Seen in the office 4/3 for cough Pertinent Negatives: Patient denies CP, SOB Disposition: [] ED /[] Urgent Care (no appt availability in office) / [x] Appointment(In office/virtual)/ []  [facility]/ [] Home Care/ [] Refused Recommended Disposition /[]  Mobile Bus/ []  Follow-up with PCP Additional Notes: Pt reports ongoing dry cough. Pt states the cough "wears him out" and he describes it as very aggravating. Pt states the cough is dry. Pt states the cough is intermittent and comes on all of a sudden. Pt was seen 4/3. Pt taking the Medrol pak he was prescribed as well as the Z-pack he was prescribed and advised to pick up if he were to get worse. Pt is also taking his Advair inhaler and using his albuterol inhaler q4-6 hrs. Pt taking loratidine and Tessalon prescribed as well. Hx of asthma and allergies. Pt recently visited [place] and then felt his ears popping on the return flight, then fell ill. Pt denies CP. Pt states he has difficulty breathing during coughing fits but otherwise denies SOB. Pt worried about a developing pneumonia. RN scheduled pt for 0900 tomorrow 4/9. RN advised pt if he gets worse or develops SOB or chest pain to call 911 - pt verbalized understanding.    Reason for Disposition  SEVERE coughing spells (e.g., whooping sound after coughing, vomiting after coughing)  Answer Assessment - Initial Assessment Questions 1. ONSET: "When did the cough begin?"      Seen in the office 4/3. Went to [place] last Thursday, came back last Tuesday. States he had no symptoms at that time and was very active. Pt states when he got on the plane his ears were popping. Wednesday AM "it came on" 2. SEVERITY: "How bad is the cough today?"      "It wears you out", patient states he started the Z-pack over the weekend and is taking the Medrol pack. Using Advair inhaler 2x in the AM, using albuterol inhaler q 4-6 hrs. Using  Astelin. Using Tessalon 3x/day. Using loratidine. States he "comes on all of a sudden. "Extremely aggravating", hasn't gone anywhere or do anything 3. SPUTUM: "Describe the color of your sputum" (none, dry cough; clear, white, yellow, green)     Dry cough 4. HEMOPTYSIS: "Are you coughing up any blood?" If so ask: "How much?" (flecks, streaks, tablespoons, etc.)     No 5. DIFFICULTY BREATHING: "Are you having difficulty breathing?" If Yes, ask: "How bad is it?" (e.g., mild, moderate, severe)    - MILD: No SOB at rest, mild SOB with walking, speaks normally in sentences, can lie down, no retractions, pulse < 100.    - MODERATE: SOB at rest, SOB with minimal exertion and prefers to sit, cannot lie down flat, speaks in phrases, mild retractions, audible wheezing, pulse 100-120.    - SEVERE: Very SOB at rest, speaks in single words, struggling to breathe, sitting hunched forward, retractions, pulse > 120      "A little because you know when you cough so much, it can cause that", "because you're coughing so much at one time", denies SOB but endorses difficulty breathing 6. FEVER: "Do you have a fever?" If Yes, ask: "What is your temperature, how was it measured, and when did it start?"     No 7. CARDIAC HISTORY: "Do you have any history of heart disease?" (e.g., heart attack, congestive heart failure)      HTN 8. LUNG HISTORY: "Do you have any history of lung disease?"  (e.g., pulmonary  embolus, asthma, emphysema)     Allergic rhinitis, asthma  9. PE RISK FACTORS: "Do you have a history of blood clots?" (or: recent major surgery, recent prolonged travel, bedridden)     Went to [place] last Thursday, flew there from [place]. No hx of PE or DVT 10. OTHER SYMPTOMS: "Do you have any other symptoms?" (e.g., runny nose, wheezing, chest pain)       "Very little" wheezing, "normally in the past I have had some", CP or tightness. Some pain with coughing  Protocols used: Cough - Acute  Non-Productive-A-AH

## 2023-06-26 ENCOUNTER — Ambulatory Visit (HOSPITAL_BASED_OUTPATIENT_CLINIC_OR_DEPARTMENT_OTHER)
Admission: RE | Admit: 2023-06-26 | Discharge: 2023-06-26 | Disposition: A | Discharge status: Home / Self Care | Payer: Medicare (Managed Care) | Source: Ambulatory Visit | Attending: Medical | Admitting: Medical

## 2023-06-26 ENCOUNTER — Encounter: Payer: Self-pay | Admitting: Medical

## 2023-06-26 ENCOUNTER — Ambulatory Visit (INDEPENDENT_AMBULATORY_CARE_PROVIDER_SITE_OTHER): Payer: Medicare (Managed Care) | Admitting: Medical

## 2023-06-26 VITALS — BP 130/70 | HR 60 | Temp 97.8°F | Resp 18 | Ht 71.0 in | Wt 232.8 lb

## 2023-06-26 DIAGNOSIS — J301 Allergic rhinitis due to pollen: Secondary | ICD-10-CM | POA: Diagnosis not present

## 2023-06-26 DIAGNOSIS — R059 Cough, unspecified: Secondary | ICD-10-CM | POA: Diagnosis not present

## 2023-06-26 DIAGNOSIS — J4541 Moderate persistent asthma with (acute) exacerbation: Secondary | ICD-10-CM | POA: Diagnosis not present

## 2023-06-26 DIAGNOSIS — J40 Bronchitis, not specified as acute or chronic: Secondary | ICD-10-CM

## 2023-06-26 MED ORDER — PREDNISONE 10 MG (21) PO TBPK: ORAL_TABLET | ORAL | 0 refills | Status: DC

## 2023-06-26 MED ORDER — HYDROCODONE BIT-HOMATROP MBR 5-1.5 MG/5ML PO SOLN: 5.0000 mL | Freq: Four times a day (QID) | ORAL | 0 refills | Status: DC | PRN

## 2023-06-26 NOTE — Progress Notes (Addendum)
 Subjective:    Patient ID: Kirk Sutton, male    DOB: September 18, 1950, 73 y.o.   MRN: [id number]  HPI  Discussed the use of AI scribe software for clinical note transcription with the patient, who gave verbal consent to proceed.  History of Present Illness   Kirk Sutton is a 73 year old male with asthma who presents with persistent cough and wheezing.  He has a persistent dry cough that has been ongoing for more than a week. The cough is particularly severe at times, causing difficulty in breathing, especially when walking down steps. It is dry, with no mucus production, and can be severe enough to wake him up early in the morning, although he was able to sleep well last night. He has wheezing and a dry cough but no significant nasal congestion or mucus production. He mentions a slight sore throat, likely from coughing.  He has been using an albuterol inhaler regularly every 4-6 hours, which he feels has helped to some extent in subsiding the cough. He also completed a course of azithromycin and a Medrol dose pack (4 mg, six-day taper), which he finished today. Additionally, he has been taking montelukast, Astelin, loratadine, and benzonatate as prescribed. He also used an over-the-counter product similar to Mucinex DM, containing acetaminophen, which he took this morning to help with sinus symptoms.  He has a history of allergic rhinitis and bronchitis, and he recently returned from a trip to [place], after which the symptoms began. He denies any significant nasal congestion or mucus production but mentions a slight sore throat, likely from the coughing. He is not allergic to narcotics and has been advised to not use tramadol  while using Hycodan for severe cough.           Review of Systems  Constitutional:  Negative for chills, fatigue and fever.  HENT:  Negative for congestion and ear pain.   Respiratory:  Positive for cough and wheezing. Negative for choking, chest tightness and  shortness of breath.   Cardiovascular:  Negative for chest pain and palpitations.  Gastrointestinal:  Negative for abdominal pain, diarrhea, nausea and rectal pain.  Genitourinary:  Negative for dysuria and frequency.  Musculoskeletal:  Negative for back pain, joint swelling and neck stiffness.  Skin:  Negative for rash.  Neurological:  Negative for dizziness, seizures and headaches.  Hematological:  Negative for adenopathy. Does not bruise/bleed easily.  Psychiatric/Behavioral:  Negative for behavioral problems and dysphoric mood.       Past Medical History:  Diagnosis Date   Allergic rhinitis    Asthma    Chronic prostatitis    sees urology   Diabetes mellitus    type 11 (a1c 6.0 03/2008)/no meds.   Hernia, umbilical    Hyperlipidemia    Hypertension    Psoriasis    @ hands, sees derm     Social History   Socioeconomic History   Marital status: Married    Spouse name: Not on file   Number of children: 2   Years of education: Not on file   Highest education level: Not on file  Occupational History   Occupation: Diplomatic Services operational officer  Tobacco Use   Smoking status: Never   Smokeless tobacco: Never  Vaping Use   Vaping status: Never Used  Substance and Sexual Activity   Alcohol use: Yes    Comment: socially, rare   Drug use: No   Sexual activity: Yes  Other Topics Concern  Not on file  Social History Narrative   Household-- pt, wife    Social Drivers of Corporate investment banker Strain: Low Risk  (05/07/2023)   Overall Financial Resource Strain (CARDIA)    Difficulty of Paying Living Expenses: Not hard at all  Food Insecurity: No Food Insecurity (05/07/2023)   Hunger Vital Sign    Worried About Running Out of Food in the Last Year: Never true    Ran Out of Food in the Last Year: Never true  Transportation Needs: No Transportation Needs (05/07/2023)   PRAPARE - Administrator, Civil Service (Medical): No    Lack of  Transportation (Non-Medical): No  Physical Activity: Sufficiently Active (05/07/2023)   Exercise Vital Sign    Days of Exercise per Week: 7 days    Minutes of Exercise per Session: 60 min  Stress: No Stress Concern Present (05/07/2023)   [employer] of Occupational Health - Occupational Stress Questionnaire    Feeling of Stress : Not at all  Social Connections: Socially Integrated (05/07/2023)   Social Connection and Isolation Panel [NHANES]    Frequency of Communication with Friends and Family: More than three times a week    Frequency of Social Gatherings with Friends and Family: More than three times a week    Attends Religious Services: More than 4 times per year    Active Member of [employer] or Organizations: Yes    Attends Engineer, structural: More than 4 times per year    Marital Status: Married  Catering manager Violence: Not At Risk (05/07/2023)   Humiliation, Afraid, Rape, and Kick questionnaire    Fear of Current or Ex-Partner: No    Emotionally Abused: No    Physically Abused: No    Sexually Abused: No    Past Surgical History:  Procedure Laterality Date   APPENDECTOMY     FERTILITY SURGERY  03/19/1977   Spermatic Vein Ligation   HERNIA REPAIR     1950s   NASAL SEPTUM SURGERY  03/20/2007   SHOULDER SURGERY  03/19/2006   left   TESTICLE SURGERY     undescended repair    UMBILICAL HERNIA REPAIR  08/28/2021   VASECTOMY      Family History  Problem Relation Age of Onset   Hypertension Mother    Colon polyps Mother    Heart attack Father        x 3 onset age 7, died at 90   Colon polyps Father    Cancer Father        bladder   Diabetes Sister    Hypertension Sister    Colon cancer Neg Hx    Prostate cancer Neg Hx    Stomach cancer Neg Hx    Esophageal cancer Neg Hx    Rectal cancer Neg Hx     Allergies  Allergen Reactions   Cetirizine Hcl     REACTION: prostatitis   Fexofenadine     REACTION: causes prostatitis    Current Outpatient  Medications on File Prior to Visit  Medication Sig Dispense Refill   acetaminophen (TYLENOL) 650 MG CR tablet Take 650 mg by mouth every 8 (eight) hours as needed for pain.     albuterol (VENTOLIN HFA) 108 (90 Base) MCG/ACT inhaler Inhale 2 puffs into the lungs every 6 (six) hours as needed for wheezing or shortness of breath. 18 g 5   ALPRAZolam (XANAX) 0.5 MG tablet Take 0.5 mg by mouth 2 (two) times daily  as needed.     amphetamine-dextroamphetamine (ADDERALL) 15 MG tablet Take 15 mg by mouth in the morning and at bedtime. In morning and at noon     Ascorbic Acid (VITAMIN C) 1000 MG tablet Take 1,000 mg by mouth daily.     atorvastatin (LIPITOR) 20 MG tablet Take 1 tablet (20 mg total) by mouth at bedtime. 90 tablet 1   azelastine (ASTELIN) 0.1 % nasal spray Place 1 spray into both nostrils every evening.     b complex vitamins capsule Take 1 capsule by mouth daily.     benzonatate (TESSALON) 200 MG capsule Take 1 capsule (200 mg total) by mouth 3 (three) times daily as needed for cough. 20 capsule 0   clobetasol ointment (TEMOVATE) 0.05 % Apply 1 application topically 2 (two) times daily as needed. For hands     Coenzyme Q10 100 MG capsule Take 100 mg by mouth daily.     cycloSPORINE (RESTASIS) 0.05 % ophthalmic emulsion 1 drop 2 (two) times daily. (Patient not taking: Reported on 03/11/2023)     desloratadine (CLARINEX) 5 MG tablet Take 10 mg by mouth daily.     FLUoxetine (PROZAC) 40 MG capsule Take 1 tablet daily  2   fluticasone (FLONASE) 50 MCG/ACT nasal spray Place 2 sprays into both nostrils daily.     fluticasone-salmeterol (ADVAIR HFA) 115-21 MCG/ACT inhaler Inhale 2 puffs into the lungs in the morning and at bedtime.     hydrochlorothiazide (HYDRODIURIL) 25 MG tablet Take 1 tablet (25 mg total) by mouth daily. 90 tablet 1   ibuprofen (ADVIL) 200 MG tablet Take 200 mg by mouth every 6 (six) hours as needed.     Magnesium Gluconate 250 MG TABS Take 400 mg by mouth daily.      methylPREDNISolone (MEDROL) 4 MG tablet Standard 6 day taper dose 21 tablet 0   metoprolol succinate (TOPROL-XL) 50 MG 24 hr tablet Take 1 tablet (50 mg total) by mouth daily. Take with or immediately following a meal 90 tablet 1   montelukast (SINGULAIR) 10 MG tablet TAKE 1 TABLET(10 MG) BY MOUTH AT BEDTIME 90 tablet 1   Psyllium (METAMUCIL PO) Take 90 mg by mouth daily.     pyridOXINE (VITAMIN B-6) 100 MG tablet Take 400 mg by mouth daily. Super B complex     terazosin (HYTRIN) 1 MG capsule Take 3 capsules (3 mg total) by mouth daily. 270 capsule 1   Turmeric 500 MG CAPS Take 1,000 mg by mouth daily.     Vitamin D, Cholecalciferol, 25 MCG (1000 UT) CAPS Take 1 capsule by mouth daily.     No current facility-administered medications on file prior to visit.    BP 130/70   Pulse 60   Temp 97.8 F (36.6 C)   Resp 18   Ht 5\' 11"  (1.803 m)   Wt 232 lb 12.8 oz (105.6 kg)   SpO2 93%   BMI 32.47 kg/m     Recheck o2 sat 97%. Pt also got 97% 02 sat at home.     Objective:   Physical Exam  General Mental Status- Alert. General Appearance- Not in acute distress.    Skin General: Color- Normal Color. Moisture- Normal Moisture.   Neck No JVD. No tracheal deviation.   Chest and Lung Exam Auscultation: Breath Sounds: clear even and unlabored   Cardiovascular Auscultation:Rythm-rrr  Murmurs & Other Heart Sounds:Auscultation of the heart reveals- No Murmurs.   Abdomen Inspection:-Inspeection Normal. Palpation/Percussion:Note:No mass. Palpation and Percussion of  the abdomen reveal- Non Tender, Non Distended + BS, no rebound or guarding.     Neurologic Cranial Nerve exam:- CN III-XII intact(No nystagmus), symmetric smile. Strength:- 5/5 equal and symmetric strength both upper and lower extremities.    Lower ext- no pedal edema. Calfs symmetric, negative homans signa.      Assessment & Plan:   Assessment and Plan    Chronic Cough with Wheezing(bronchitis like. Asthma  excacerbation suspected) Persistent cough with wheezing, dyspnea on exertion, and sleep disruption. Partial relief with albuterol. Differential includes asthma exacerbation or reactive airway disease. Chest x-ray to rule out pneumonia. Hycodan prescribed for severe cough with sedation precautions. Advised against tramadol or benzonatate with Hycodan. Referral to allergist or pulmonologist if symptoms persist with additional treatment - Order chest x-ray. - Prescribe Hycodan for severe cough, with sedation precautions. - Advise against using tramadol or benzonatate with Hycodan. - Continue Advair inhaler, two inhalations twice daily. - Prescribe prednisone 6-day taper if chest x-ray is clear. -continue other meds the same. - Consider referral to allergist or pulmonologist if symptoms persist.  Allergic Rhinitis Allergic rhinitis may contribute to nasal stuffiness. Using loratadine and Nasonex DM as needed. - Continue loratadine. - Consider use of Nasonex DM as needed for nasal symptoms.  Follow-up Scheduled to see Dr. Drue Novel on April 29th. Further follow-up depends on chest x-ray results and treatment response. - Follow up with Dr. Drue Novel on April 29th. - Re-evaluate based on chest x-ray results and response to treatment.        Esperanza Richters, PA-C

## 2023-06-26 NOTE — Patient Instructions (Signed)
 Chronic Cough with Wheezing(bronchitis like. Asthma excacerbation suspected) Persistent cough with wheezing, dyspnea on exertion, and sleep disruption. Partial relief with albuterol. Differential includes asthma exacerbation or reactive airway disease. Chest x-ray to rule out pneumonia. Hycodan prescribed for severe cough with sedation precautions. Advised against tramadol or benzonatate with Hycodan. Referral to allergist or pulmonologist if symptoms persist with additional treatment - Order chest x-ray. - Prescribe Hycodan for severe cough, with sedation precautions. - Advise against using tramadol or benzonatate with Hycodan. - Continue Advair inhaler, two inhalations twice daily. - Prescribe prednisone 6-day taper if chest x-ray is clear. -continue other meds the same. - Consider referral to allergist or pulmonologist if symptoms persist.   Allergic Rhinitis Allergic rhinitis may contribute to nasal stuffiness. Using loratadine and Nasonex DM as needed. - Continue loratadine. - Consider use of Nasonex DM as needed for nasal symptoms.   Follow-up Scheduled to see Dr. Drue Novel on April 29th. Further follow-up depends on chest x-ray results and treatment response. - Follow up with Dr. Drue Novel on April 29th. - Re-evaluate based on chest x-ray results and response to treatment.

## 2023-06-28 ENCOUNTER — Ambulatory Visit: Payer: Medicare (Managed Care) | Admitting: Internal Medicine

## 2023-07-01 DIAGNOSIS — M5416 Radiculopathy, lumbar region: Secondary | ICD-10-CM | POA: Diagnosis not present

## 2023-07-01 DIAGNOSIS — J329 Chronic sinusitis, unspecified: Secondary | ICD-10-CM | POA: Diagnosis not present

## 2023-07-01 DIAGNOSIS — R053 Chronic cough: Secondary | ICD-10-CM | POA: Diagnosis not present

## 2023-07-01 DIAGNOSIS — M545 Low back pain, unspecified: Secondary | ICD-10-CM | POA: Diagnosis not present

## 2023-07-01 DIAGNOSIS — J40 Bronchitis, not specified as acute or chronic: Secondary | ICD-10-CM | POA: Diagnosis not present

## 2023-07-16 ENCOUNTER — Encounter: Payer: Self-pay | Admitting: Internal Medicine

## 2023-07-16 ENCOUNTER — Ambulatory Visit (INDEPENDENT_AMBULATORY_CARE_PROVIDER_SITE_OTHER): Payer: Medicare (Managed Care) | Admitting: Internal Medicine

## 2023-07-16 VITALS — BP 116/80 | HR 58 | Temp 97.8°F | Resp 18 | Ht 71.0 in | Wt 233.1 lb

## 2023-07-16 DIAGNOSIS — I10 Essential (primary) hypertension: Secondary | ICD-10-CM

## 2023-07-16 DIAGNOSIS — R739 Hyperglycemia, unspecified: Secondary | ICD-10-CM | POA: Diagnosis not present

## 2023-07-16 DIAGNOSIS — E785 Hyperlipidemia, unspecified: Secondary | ICD-10-CM

## 2023-07-16 LAB — BASIC METABOLIC PANEL WITH GFR
BUN: 14 mg/dL (ref 6–23)
CO2: 32 meq/L (ref 19–32)
Calcium: 8.9 mg/dL (ref 8.4–10.5)
Chloride: 99 meq/L (ref 96–112)
Creatinine, Ser: 1.17 mg/dL (ref 0.40–1.50)
GFR: 61.08 mL/min (ref >60.00)
Glucose, Bld: 104 mg/dL — ABNORMAL HIGH (ref 70–99)
Potassium: 4 meq/L (ref 3.5–5.1)
Sodium: 137 meq/L (ref 135–145)

## 2023-07-16 LAB — HEMOGLOBIN A1C: Hgb A1c MFr Bld: 6.3 % (ref 4.6–6.5)

## 2023-07-16 NOTE — Patient Instructions (Addendum)
   INSTRUCTIONS  FOR TODAY     GO TO THE LAB : Get the blood work     Next office visit for a physical exam by October 2025 Please make an appointment before you leave today

## 2023-07-16 NOTE — Progress Notes (Unsigned)
 Subjective:    Patient ID: Kirk Sutton, male    DOB: 11-Dec-1950, 73 y.o.   MRN: [id number]  DOS:  07/16/2023 Type of visit - description: Follow-up  Here for follow-up. Chronic medical problems addressed. Continue with MSK issues.  BP Readings from Last 3 Encounters:  07/16/23 116/80  06/26/23 130/70  06/20/23 (!) 140/72    Review of Systems See above   Past Medical History:  Diagnosis Date   Allergic rhinitis    Asthma    Chronic prostatitis    sees urology   Diabetes mellitus    type 11 (a1c 6.0 03/2008)/no meds.   Hernia, umbilical    Hyperlipidemia    Hypertension    Psoriasis    @ hands, sees derm    Past Surgical History:  Procedure Laterality Date   APPENDECTOMY     FERTILITY SURGERY  03/19/1977   Spermatic Vein Ligation   HERNIA REPAIR     1950s   NASAL SEPTUM SURGERY  03/20/2007   SHOULDER SURGERY  03/19/2006   left   TESTICLE SURGERY     undescended repair    UMBILICAL HERNIA REPAIR  08/28/2021   VASECTOMY      Current Outpatient Medications  Medication Instructions   acetaminophen (TYLENOL) 650 mg, Every 8 hours PRN   albuterol  (VENTOLIN  HFA) 108 (90 Base) MCG/ACT inhaler 2 puffs, Inhalation, Every 6 hours PRN   ALPRAZolam (XANAX) 0.5 mg, 2 times daily PRN   amphetamine-dextroamphetamine (ADDERALL) 15 MG tablet 15 mg, 2 times daily   atorvastatin  (LIPITOR) 20 mg, Oral, Daily at bedtime   azelastine (ASTELIN) 0.1 % nasal spray 1 spray, Every evening   b complex vitamins capsule 1 capsule, Daily   benzonatate  (TESSALON ) 200 mg, Oral, 3 times daily PRN   clobetasol ointment (TEMOVATE) 0.05 % 1 application , 2 times daily PRN   Coenzyme Q10 100 mg, Daily   cycloSPORINE (RESTASIS) 0.05 % ophthalmic emulsion 1 drop, 2 times daily   desloratadine (CLARINEX) 10 mg, Daily   FLUoxetine (PROZAC) 40 MG capsule Take 1 tablet daily   fluticasone  (FLONASE ) 50 MCG/ACT nasal spray 2 sprays, Daily   fluticasone -salmeterol (ADVAIR HFA) 115-21 MCG/ACT  inhaler 2 puffs, 2 times daily   hydrochlorothiazide  (HYDRODIURIL ) 25 mg, Oral, Daily   HYDROcodone  bit-homatropine (HYCODAN) 5-1.5 MG/5ML syrup 5 mLs, Oral, Every 6 hours PRN   ibuprofen (ADVIL) 200 mg, Every 6 hours PRN   Magnesium Gluconate 400 mg, Daily   methylPREDNISolone  (MEDROL ) 4 MG tablet Standard 6 day taper dose   metoprolol  succinate (TOPROL -XL) 50 mg, Oral, Daily, Take with or immediately following a meal   montelukast  (SINGULAIR ) 10 MG tablet TAKE 1 TABLET(10 MG) BY MOUTH AT BEDTIME   predniSONE  (STERAPRED UNI-PAK 21 TAB) 10 MG (21) TBPK tablet Taper over 6 days.   Psyllium (METAMUCIL PO) 90 mg, Daily   pyridOXINE (VITAMIN B6) 400 mg, Daily   terazosin  (HYTRIN ) 3 mg, Oral, Daily   Turmeric 1,000 mg, Daily   vitamin C 1,000 mg, Daily   Vitamin D, Cholecalciferol, 25 MCG (1000 UT) CAPS 1 capsule, Daily       Objective:   Physical Exam BP 116/80   Pulse (!) 58   Temp 97.8 F (36.6 C) (Oral)   Resp 18   Ht 5\' 11"  (1.803 m)   Wt 233 lb 2 oz (105.7 kg)   SpO2 96%   BMI 32.51 kg/m  General:   Well developed, NAD, BMI noted. HEENT:  Normocephalic . Face  symmetric, atraumatic Lungs:  CTA B Normal respiratory effort, no intercostal retractions, no accessory muscle use. Heart: RRR,  no murmur.  Lower extremities: no pretibial edema bilaterally  Skin: Not pale. Not jaundice Neurologic:  alert & oriented X3.  Speech normal, gait appropriate for age and unassisted Psych--  Cognition and judgment appear intact.  Cooperative with normal attention span and concentration.  Behavior appropriate. No anxious or depressed appearing.      Assessment    Assessment Prediabetes  HTN Hyperlipidemia Anxiety Dr Scherry Curtis (psych)   Cough variant asthma, atopic dermatitis, allergic rhinitis and allergic conjunctivitis.  MSK:  --Chronic back pain, pain meds prn --DJD- hands Chronic prostatitis, sees urology Psoriasis, hands, sees dermatology Snoring - dentist Rx a moth guard  ~ 08-2015, helps Negative a stress test 09-2021  PLAN Diabetes: Diet controlled, check A1c HTN: BP looks good, continue HCTZ, metoprolol , terazosin .  Check BMP Cough variant asthma: Was seen few times since the last visit, now eventually better. MSK: Continue with pain, stopped ibuprofen, now on Celebrex. To have a local injection in the back soon. Still managed to stay active and take some walks. RTC 6 months CPX

## 2023-07-17 NOTE — Assessment & Plan Note (Signed)
 Diabetes: Diet controlled, check A1c HTN: BP looks good, continue HCTZ, metoprolol , terazosin .  Check BMP Cough variant asthma: Was seen few times since the last visit, now eventually better. MSK: Continue with pain, stopped ibuprofen, now on Celebrex. To have a local injection in the back soon. Still managed to stay active and take some walks. RTC 6 months CPX

## 2023-07-18 ENCOUNTER — Encounter: Payer: Self-pay | Admitting: Internal Medicine

## 2023-07-18 DIAGNOSIS — M5416 Radiculopathy, lumbar region: Secondary | ICD-10-CM | POA: Diagnosis not present

## 2023-07-23 DIAGNOSIS — M25561 Pain in right knee: Secondary | ICD-10-CM | POA: Diagnosis not present

## 2023-07-25 ENCOUNTER — Other Ambulatory Visit: Payer: Self-pay | Admitting: Internal Medicine

## 2023-08-07 DIAGNOSIS — M19012 Primary osteoarthritis, left shoulder: Secondary | ICD-10-CM | POA: Diagnosis not present

## 2023-08-08 ENCOUNTER — Other Ambulatory Visit: Payer: Self-pay | Admitting: Internal Medicine

## 2023-08-08 DIAGNOSIS — I10 Essential (primary) hypertension: Secondary | ICD-10-CM

## 2023-08-08 MED ORDER — ATORVASTATIN CALCIUM 20 MG PO TABS: 20.0000 mg | ORAL_TABLET | Freq: Every day | ORAL | 1 refills | Status: DC

## 2023-08-08 NOTE — Telephone Encounter (Signed)
 Copied from CRM [id number]. Topic: Clinical - Medication Refill >> Aug 08, 2023  1:29 PM Chuck Crater wrote: Medication: atorvastatin  (LIPITOR) 20 MG tablet 90 day supply   Has the patient contacted their pharmacy? No (Agent: If no, request that the patient contact the pharmacy for the refill. If patient does not wish to contact the pharmacy document the reason why and proceed with request.) (Agent: If yes, when and what did the pharmacy advise?) non refills left  This is the patient's preferred pharmacy:  [id number] - [place], [place] - [address] AT [facility] [address] & [address] [place] [address] Phone: [phone] Fax: [phone]  Is this the correct pharmacy for this prescription? Yes If no, delete pharmacy and type the correct one.   Has the prescription been filled recently? Yes 04/24 8 tablets  Is the patient out of the medication? No a few left  Has the patient been seen for an appointment in the last year OR does the patient have an upcoming appointment? Yes  Can we respond through MyChart? Yes  Agent: Please be advised that Rx refills may take up to 3 business days. We ask that you follow-up with your pharmacy.

## 2023-08-21 DIAGNOSIS — G8929 Other chronic pain: Secondary | ICD-10-CM | POA: Diagnosis not present

## 2023-08-21 DIAGNOSIS — M25561 Pain in right knee: Secondary | ICD-10-CM | POA: Diagnosis not present

## 2023-08-26 DIAGNOSIS — R200 Anesthesia of skin: Secondary | ICD-10-CM | POA: Diagnosis not present

## 2023-08-26 DIAGNOSIS — M5416 Radiculopathy, lumbar region: Secondary | ICD-10-CM | POA: Diagnosis not present

## 2023-08-26 DIAGNOSIS — M25561 Pain in right knee: Secondary | ICD-10-CM | POA: Diagnosis not present

## 2023-08-27 ENCOUNTER — Other Ambulatory Visit: Payer: Self-pay

## 2023-09-27 DIAGNOSIS — M1711 Unilateral primary osteoarthritis, right knee: Secondary | ICD-10-CM | POA: Diagnosis not present

## 2023-09-27 DIAGNOSIS — M25552 Pain in left hip: Secondary | ICD-10-CM | POA: Diagnosis not present

## 2023-10-07 DIAGNOSIS — M5440 Lumbago with sciatica, unspecified side: Secondary | ICD-10-CM | POA: Diagnosis not present

## 2023-10-07 DIAGNOSIS — M5412 Radiculopathy, cervical region: Secondary | ICD-10-CM | POA: Diagnosis not present

## 2023-10-07 DIAGNOSIS — M51362 Other intervertebral disc degeneration, lumbar region with discogenic back pain and lower extremity pain: Secondary | ICD-10-CM | POA: Diagnosis not present

## 2023-10-07 DIAGNOSIS — M5416 Radiculopathy, lumbar region: Secondary | ICD-10-CM | POA: Diagnosis not present

## 2023-10-16 DIAGNOSIS — M25561 Pain in right knee: Secondary | ICD-10-CM | POA: Diagnosis not present

## 2023-11-28 DIAGNOSIS — J3089 Other allergic rhinitis: Secondary | ICD-10-CM | POA: Diagnosis not present

## 2023-11-28 DIAGNOSIS — J45991 Cough variant asthma: Secondary | ICD-10-CM | POA: Diagnosis not present

## 2023-11-28 DIAGNOSIS — J301 Allergic rhinitis due to pollen: Secondary | ICD-10-CM | POA: Diagnosis not present

## 2023-11-28 DIAGNOSIS — L2089 Other atopic dermatitis: Secondary | ICD-10-CM | POA: Diagnosis not present

## 2023-12-12 DIAGNOSIS — M180 Bilateral primary osteoarthritis of first carpometacarpal joints: Secondary | ICD-10-CM | POA: Diagnosis not present

## 2023-12-16 DIAGNOSIS — Z79899 Other long term (current) drug therapy: Secondary | ICD-10-CM | POA: Diagnosis not present

## 2023-12-16 DIAGNOSIS — M5416 Radiculopathy, lumbar region: Secondary | ICD-10-CM | POA: Diagnosis not present

## 2023-12-16 DIAGNOSIS — M48061 Spinal stenosis, lumbar region without neurogenic claudication: Secondary | ICD-10-CM | POA: Diagnosis not present

## 2024-01-07 DIAGNOSIS — M25561 Pain in right knee: Secondary | ICD-10-CM | POA: Diagnosis not present

## 2024-01-15 ENCOUNTER — Other Ambulatory Visit: Payer: Self-pay | Admitting: Internal Medicine

## 2024-01-15 ENCOUNTER — Encounter: Payer: Self-pay | Admitting: Internal Medicine

## 2024-01-15 ENCOUNTER — Ambulatory Visit: Payer: Medicare (Managed Care) | Admitting: Internal Medicine

## 2024-01-15 VITALS — BP 126/80 | HR 55 | Temp 98.0°F | Resp 16 | Ht 71.0 in | Wt 229.5 lb

## 2024-01-15 DIAGNOSIS — I10 Essential (primary) hypertension: Secondary | ICD-10-CM | POA: Diagnosis not present

## 2024-01-15 DIAGNOSIS — F411 Generalized anxiety disorder: Secondary | ICD-10-CM | POA: Diagnosis not present

## 2024-01-15 DIAGNOSIS — E785 Hyperlipidemia, unspecified: Secondary | ICD-10-CM | POA: Diagnosis not present

## 2024-01-15 DIAGNOSIS — R739 Hyperglycemia, unspecified: Secondary | ICD-10-CM

## 2024-01-15 DIAGNOSIS — Z23 Encounter for immunization: Secondary | ICD-10-CM

## 2024-01-15 LAB — BASIC METABOLIC PANEL WITH GFR
BUN: 20 mg/dL (ref 6–23)
CO2: 32 meq/L (ref 19–32)
Calcium: 9 mg/dL (ref 8.4–10.5)
Chloride: 98 meq/L (ref 96–112)
Creatinine, Ser: 1.13 mg/dL (ref 0.40–1.50)
GFR: 63.46 mL/min (ref >60.00)
Glucose, Bld: 103 mg/dL — ABNORMAL HIGH (ref 70–99)
Potassium: 4 meq/L (ref 3.5–5.1)
Sodium: 136 meq/L (ref 135–145)

## 2024-01-15 LAB — CBC WITH DIFFERENTIAL/PLATELET
Basophils Absolute: 0 K/uL (ref 0.0–0.1)
Basophils Relative: 0.3 % (ref 0.0–3.0)
Eosinophils Absolute: 0.2 K/uL (ref 0.0–0.7)
Eosinophils Relative: 2.6 % (ref 0.0–5.0)
HCT: 39.5 % (ref 39.0–52.0)
Hemoglobin: 13.3 g/dL (ref 13.0–17.0)
Lymphocytes Relative: 20.3 % (ref 12.0–46.0)
Lymphs Abs: 1.3 K/uL (ref 0.7–4.0)
MCHC: 33.8 g/dL (ref 30.0–36.0)
MCV: 94.4 fl (ref 78.0–100.0)
Monocytes Absolute: 0.6 K/uL (ref 0.1–1.0)
Monocytes Relative: 9 % (ref 3.0–12.0)
Neutro Abs: 4.3 K/uL (ref 1.4–7.7)
Neutrophils Relative %: 67.8 % (ref 43.0–77.0)
Platelets: 167 K/uL (ref 150.0–400.0)
RBC: 4.19 Mil/uL — ABNORMAL LOW (ref 4.22–5.81)
RDW: 13.7 % (ref 11.5–15.5)
WBC: 6.4 K/uL (ref 4.0–10.5)

## 2024-01-15 LAB — LIPID PANEL
Cholesterol: 84 mg/dL (ref 0–200)
HDL: 52.1 mg/dL
LDL Cholesterol: 21 mg/dL (ref 0–99)
NonHDL: 31.96
Total CHOL/HDL Ratio: 2
Triglycerides: 54 mg/dL (ref 0.0–149.0)
VLDL: 10.8 mg/dL (ref 0.0–40.0)

## 2024-01-15 LAB — HEMOGLOBIN A1C: Hgb A1c MFr Bld: 6.3 % (ref 4.6–6.5)

## 2024-01-15 NOTE — Patient Instructions (Addendum)
 GO TO THE LAB :  Get the blood work    Then, go to the front desk for the checkout Please make an appointment for a physical exam in 4 months   You got a flu shot and pneumonia shot today.   Check the  blood pressure regularly Blood pressure goal:  between 110/65 and  135/85. If it is consistently higher or lower, let me know     YOUR PLAN (AI generated)  Tramadol interacts with fluoxetine. Take the least amount of tramadol possible. If the interaction take place you may have the following symptoms: Agitation, confusion, blood pressure being very high or low, unusual sweats, shivering. If that ever happened, stop tramadol and seek immediate medical attention.    CHRONIC BACK PAIN WITH LEFT SCIATICA: Continue working with your orthopedic doctors.   HYPERTENSION: Your high blood pressure is well controlled with your current medications. -Continue taking hydrochlorothiazide  and metoprolol  as prescribed.  PREDIABETES: You have prediabetes we will get a test called A1c to check how you are doing   HYPERLIPIDEMIA: You have high cholesterol, which is being managed with atorvastatin . -Continue taking atorvastatin  as prescribed. -Get a fasting lipid panel (FLP) and liver function tests (AST, ALT) at your next visit.  ADHD: Your ADHD is being managed effectively with your current medication. -Continue taking amphetamine-dextroamphetamine as prescribed.  ANXIETY: Your anxiety is being managed with fluoxetine and alprazolam as needed. -Continue taking fluoxetine as prescribed. -Use alprazolam as needed for acute anxiety episodes.

## 2024-01-15 NOTE — Progress Notes (Signed)
 Subjective:    Patient ID: Kirk Sutton, male    DOB: 04-20-1950, 73 y.o.   MRN: [id number]  DOS:  01/15/2024 Follow-up  Discussed the use of AI scribe software for clinical note transcription with the patient, who gave verbal consent to proceed.  History of Present Illness Kirk Sutton is a 73 year old male who presents for follow-up of left sciatic nerve pain and right knee issues.  Left sciatic nerve pain - Significant left-sided sciatic nerve pain, severely aggravated during a recent trip to [place]  - After flying from [facility] to [place], experienced intense pain resulting in inability to ambulate - Initial management with Tylenol was ineffective; pain intensified and caused nocturnal awakenings - Started tramadol three times daily, then reduced to twice daily, resulting in significant pain relief - Increased agitation since starting tramadol  Right knee arthralgia - Right knee pain exacerbated by activities such as ascending and descending stairs at work - Remains physically active, including playing golf and maintaining regular physical activity  Medication use and side effects - Currently taking Celebrex for arthritis, tramadol for sciatic nerve pain, Xanax, fluoxetine, Adderall, and atorvastatin  - Uses Xanax sparingly, only when necessary    Review of Systems See above   Past Medical History:  Diagnosis Date   Allergic rhinitis    Asthma    Chronic prostatitis    sees urology   Diabetes mellitus    type 11 (a1c 6.0 03/2008)/no meds.   Hernia, umbilical    Hyperlipidemia    Hypertension    Psoriasis    @ hands, sees derm    Past Surgical History:  Procedure Laterality Date   APPENDECTOMY     FERTILITY SURGERY  03/19/1977   Spermatic Vein Ligation   HERNIA REPAIR     1950s   NASAL SEPTUM SURGERY  03/20/2007   SHOULDER SURGERY  03/19/2006   left   TESTICLE SURGERY     undescended repair    UMBILICAL HERNIA REPAIR  08/28/2021   VASECTOMY       Current Outpatient Medications  Medication Instructions   acetaminophen (TYLENOL) 650 mg, Every 8 hours PRN   albuterol  (VENTOLIN  HFA) 108 (90 Base) MCG/ACT inhaler 2 puffs, Inhalation, Every 6 hours PRN   ALPRAZolam (XANAX) 0.5 mg, 2 times daily PRN   atorvastatin  (LIPITOR) 20 mg, Oral, Daily at bedtime   azelastine (ASTELIN) 0.1 % nasal spray 1 spray, Every evening   b complex vitamins capsule 1 capsule, Daily   celecoxib (CELEBREX) 100 MG capsule Take by mouth. Per orthopedics   clobetasol ointment (TEMOVATE) 0.05 % 1 application , 2 times daily PRN   Coenzyme Q10 100 mg, Daily   cycloSPORINE (RESTASIS) 0.05 % ophthalmic emulsion 1 drop, 2 times daily   desloratadine (CLARINEX) 10 mg, Daily   Dextroamphetamine Sulfate 20 mg, 2 times daily   FLUoxetine (PROZAC) 40 MG capsule Take 1 tablet daily   fluticasone  (FLONASE ) 50 MCG/ACT nasal spray 2 sprays, Daily   fluticasone -salmeterol (ADVAIR HFA) 115-21 MCG/ACT inhaler 2 puffs, 2 times daily   hydrochlorothiazide  (HYDRODIURIL ) 25 mg, Oral, Daily   Magnesium Gluconate 400 mg, Daily   metoprolol  succinate (TOPROL -XL) 50 mg, Oral, Daily, Take with or immediately following a meal   montelukast  (SINGULAIR ) 10 MG tablet TAKE 1 TABLET(10 MG) BY MOUTH AT BEDTIME   Psyllium (METAMUCIL PO) 90 mg, Daily   pyridOXINE (VITAMIN B6) 400 mg, Daily   terazosin  (HYTRIN ) 3 mg, Oral, Daily   traMADol (ULTRAM) 50 mg, 3  times daily PRN   Turmeric 1,000 mg, Daily   vitamin C 1,000 mg, Daily   Vitamin D, Cholecalciferol, 25 MCG (1000 UT) CAPS 1 capsule, Daily       Objective:   Physical Exam BP 126/80   Pulse (!) 55   Temp 98 F (36.7 C) (Oral)   Resp 16   Ht 5' 11 (1.803 m)   Wt 229 lb 8 oz (104.1 kg)   SpO2 98%   BMI 32.01 kg/m  General:   Well developed, NAD, BMI noted. HEENT:  Normocephalic . Face symmetric, atraumatic Lungs:  CTA B Normal respiratory effort, no intercostal retractions, no accessory muscle use. Heart: RRR,  no  murmur.  Lower extremities: no pretibial edema bilaterally  Skin: Not pale. Not jaundice Neurologic:  alert & oriented X3.  Speech normal, gait appropriate for age and unassisted Psych--  Cognition and judgment appear intact.  Cooperative with normal attention span and concentration.  Behavior appropriate. No anxious or depressed appearing.      Assessment     Assessment Prediabetes  HTN Hyperlipidemia Anxiety Dr Leonore (psych)   Cough variant asthma, atopic dermatitis, allergic rhinitis and allergic conjunctivitis.  MSK:  --Chronic back pain, pain meds prn --DJD- hands Chronic prostatitis, sees urology Psoriasis, hands, sees dermatology Snoring - dentist Rx a moth guard ~ 08-2015, helps Negative a stress test 09-2021  === Assessment and Plan Assessment & Plan Chronic back pain with left sciatica Having MSK issues lately, under the care of orthopedics, Dr. Gifford. Was prescribed tramadol which helped significantly.  Advised about the potential interaction between tramadol and fluoxetine, currently with no apparent symptoms. Recommend to stop tramadol and seek immediate medical attention if he has agitation, sweats, BP swings, shivering etc.  He verbalized understanding. HTN: Hypertension well controlled with hydrochlorothiazide  and metoprolol .  Check BMP and CBC Prediabetes: Reports he is doing well, active, eating healthy, check A1c Hyperlipidemia Hyperlipidemia managed with atorvastatin .  Check FLP AST ALT ADHD, anxiety: See psychiatry, managed with amphetamine-dextroamphetamine, fluoxetine and Xanax He takes infrequently. Preventive care: Flu shot and PNM 20 today.  Recommend COVID booster he is not inclined to proceed Had a colonoscopy 04/2023 RTC CPX 4 months

## 2024-01-16 ENCOUNTER — Ambulatory Visit: Payer: Self-pay | Admitting: Internal Medicine

## 2024-01-16 NOTE — Assessment & Plan Note (Signed)
 Chronic back pain with left sciatica Having MSK issues lately, under the care of orthopedics, Dr. Bonner. Was prescribed tramadol which helped significantly.  Advised about the potential interaction between tramadol and fluoxetine, currently with no apparent symptoms. Recommend to stop tramadol and seek immediate medical attention if he has agitation, sweats, BP swings, shivering etc.  He verbalized understanding. HTN: well controlled with hydrochlorothiazide  and metoprolol .  Check BMP and CBC Prediabetes: Reports he is doing well, active, eating healthy, check A1c Hyperlipidemia  managed with atorvastatin .  Check FLP AST ALT ADHD, anxiety: See psychiatry, managed with amphetamine-dextroamphetamine, fluoxetine and Xanax He takes infrequently. Preventive care: Flu shot and PNM 20 today.  Recommend COVID booster he is not inclined to proceed Had a colonoscopy 04/2023 RTC CPX 4 months

## 2024-02-07 ENCOUNTER — Other Ambulatory Visit: Payer: Self-pay | Admitting: Internal Medicine

## 2024-02-07 DIAGNOSIS — I10 Essential (primary) hypertension: Secondary | ICD-10-CM

## 2024-02-27 DIAGNOSIS — L2089 Other atopic dermatitis: Secondary | ICD-10-CM | POA: Diagnosis not present

## 2024-02-27 DIAGNOSIS — L281 Prurigo nodularis: Secondary | ICD-10-CM | POA: Diagnosis not present

## 2024-02-27 DIAGNOSIS — Z129 Encounter for screening for malignant neoplasm, site unspecified: Secondary | ICD-10-CM | POA: Diagnosis not present

## 2024-02-27 DIAGNOSIS — L570 Actinic keratosis: Secondary | ICD-10-CM | POA: Diagnosis not present

## 2024-02-27 DIAGNOSIS — Z85828 Personal history of other malignant neoplasm of skin: Secondary | ICD-10-CM | POA: Diagnosis not present

## 2024-02-27 DIAGNOSIS — D1801 Hemangioma of skin and subcutaneous tissue: Secondary | ICD-10-CM | POA: Diagnosis not present

## 2024-02-27 DIAGNOSIS — L820 Inflamed seborrheic keratosis: Secondary | ICD-10-CM | POA: Diagnosis not present

## 2024-02-27 DIAGNOSIS — L814 Other melanin hyperpigmentation: Secondary | ICD-10-CM | POA: Diagnosis not present

## 2024-02-27 DIAGNOSIS — L821 Other seborrheic keratosis: Secondary | ICD-10-CM | POA: Diagnosis not present

## 2024-02-27 DIAGNOSIS — D225 Melanocytic nevi of trunk: Secondary | ICD-10-CM | POA: Diagnosis not present

## 2024-03-04 ENCOUNTER — Ambulatory Visit (INDEPENDENT_AMBULATORY_CARE_PROVIDER_SITE_OTHER): Payer: Medicare (Managed Care) | Admitting: Family Medicine

## 2024-03-04 ENCOUNTER — Ambulatory Visit: Payer: Self-pay

## 2024-03-04 ENCOUNTER — Encounter: Payer: Self-pay | Admitting: Family Medicine

## 2024-03-04 VITALS — BP 120/68 | HR 78 | Temp 97.9°F | Resp 16 | Ht 71.0 in | Wt 234.6 lb

## 2024-03-04 DIAGNOSIS — J4541 Moderate persistent asthma with (acute) exacerbation: Secondary | ICD-10-CM

## 2024-03-04 DIAGNOSIS — M1711 Unilateral primary osteoarthritis, right knee: Secondary | ICD-10-CM | POA: Diagnosis not present

## 2024-03-04 MED ORDER — PREDNISONE 20 MG PO TABS: 40.0000 mg | ORAL_TABLET | Freq: Every day | ORAL | 0 refills | Status: AC

## 2024-03-04 NOTE — Progress Notes (Signed)
 Chief Complaint  Patient presents with   Cough    Cough     Kirk Sutton here for URI complaints.  Duration: 1 day  Associated symptoms: subjective fever, sinus pain, rhinorrhea, wheezing, and coughing, dental pain Denies: sinus congestion, itchy watery eyes, ear pain, ear drainage, sore throat, shortness of breath, myalgia, and fevers Treatment to date: push fluids, Mucinex Sick contacts: No  Past Medical History:  Diagnosis Date   Allergic rhinitis    Asthma    Chronic prostatitis    sees urology   Diabetes mellitus    type 11 (a1c 6.0 03/2008)/no meds.   Hernia, umbilical    Hyperlipidemia    Hypertension    Psoriasis    @ hands, sees derm    Objective BP 120/68 (BP Location: Left Arm, Patient Position: Sitting)   Pulse 78   Temp 97.9 F (36.6 C) (Oral)   Resp 16   Ht 5' 11 (1.803 m)   Wt 234 lb 9.6 oz (106.4 kg)   SpO2 95%   BMI 32.72 kg/m  General: Awake, alert, appears stated age HEENT: AT, [place], ears patent b/l and TM's neg, nares patent w/o discharge, pharynx pink and without exudates, MMM, +max sinus ttp Neck: No masses or asymmetry Heart: RRR Lungs: + Diffuse expiratory wheezes, no accessory muscle use Psych: Age appropriate judgment and insight, normal mood and affect  Moderate persistent asthma with exacerbation - Plan: predniSONE  (DELTASONE ) 20 MG tablet  Exacerbation of chronic issue.  5-day prednisone  burst 40 mg daily.  Take rescue inhaler every 4 hours while awake.  Continue maintenance inhalers.  Continue to push fluids, practice good hand hygiene, cover mouth when coughing. F/u prn. If starting to experience fevers, shaking, or shortness of breath, seek immediate care. Pt voiced understanding and agreement to the plan.  [name], DO 03/04/2024 3:31 PM

## 2024-03-04 NOTE — Telephone Encounter (Signed)
 FYI Only or Action Required?: FYI only for provider: appointment scheduled on 03/04/24.  Patient was last seen in primary care on 01/15/2024 by Amon Aloysius BRAVO, MD.  Called Nurse Triage reporting Cough.  Symptoms began yesterday.  Interventions attempted: OTC medications: cough drops, mucinex, albuterol , advair.  Symptoms are: unchanged.  Triage Disposition: See Physician Within 24 Hours  Patient/caregiver understands and will follow disposition?: Yes   Copied from CRM #[id number]. Topic: Clinical - Red Word Triage >> Mar 04, 2024  8:15 AM Eva FALCON wrote: Red Word that prompted transfer to Nurse Triage: pt states severe case of bronchitis, having severe cough, congestion, extreme chest pressure. Reason for Disposition  [1] Continuous (nonstop) coughing interferes with work or school AND [2] no improvement using cough treatment per Care Advice  Answer Assessment - Initial Assessment Questions Scheduled 03/04/24 Advised call back or ED/911 if symptoms worsen. Patient verbalized understanding.  1. ONSET: When did the cough begin?      yesterday 2. SEVERITY: How bad is the cough today?     severe 3. SPUTUM: Describe the color of your sputum (e.g., none, dry cough; clear, white, yellow, green)     none 4. HEMOPTYSIS: Are you coughing up any blood? If Yes, ask: How much? (e.g., flecks, streaks, tablespoons, etc.)     no 5. DIFFICULTY BREATHING: Are you having difficulty breathing? If Yes, ask: How bad is it? (e.g., mild, moderate, severe)      none 6. FEVER: Do you have a fever? If Yes, ask: What is your temperature, how was it measured, and when did it start?     Denies fever, chills, n/v 7. CARDIAC HISTORY: Do you have any history of heart disease? (e.g., heart attack, congestive heart failure)      no 8. LUNG HISTORY: Do you have any history of lung disease?  (e.g., pulmonary embolus, asthma, emphysema)     Asthma, bronchitis 9. PE RISK FACTORS: Do you have a  history of blood clots? (or: recent major surgery, recent prolonged travel, bedridden)     no 10. OTHER SYMPTOMS: Do you have any other symptoms? (e.g., runny nose, wheezing, chest pain)       Runny nose Wheezing intermittent with coughing, chest discomfort 97% RA, albuterol  2 puffs q6 prn, advair  Protocols used: Cough - Acute Productive-A-AH

## 2024-03-04 NOTE — Patient Instructions (Addendum)
 Continue to push fluids, practice good hand hygiene, and cover your mouth if you cough.  If you start having fevers, shaking or shortness of breath, seek immediate care.  OK to take Tylenol 1000 mg (2 extra strength tabs) or 975 mg (3 regular strength tabs) every 6 hours as needed.  Take your rescue inhaler every 4 hours while awake. Continue your maintenance inhalers.   Send me a message [place] if no better in the next 2 days.   Let us  know if you need anything.

## 2024-03-04 NOTE — Telephone Encounter (Signed)
 Appt scheduled

## 2024-03-06 ENCOUNTER — Telehealth: Payer: Self-pay

## 2024-03-06 MED ORDER — FLUTICASONE PROPIONATE HFA 110 MCG/ACT IN AERO: INHALATION_SPRAY | RESPIRATORY_TRACT | 1 refills | Status: DC

## 2024-03-06 MED ORDER — BENZONATATE 200 MG PO CAPS: 200.0000 mg | ORAL_CAPSULE | Freq: Two times a day (BID) | ORAL | 0 refills | Status: DC | PRN

## 2024-03-06 NOTE — Telephone Encounter (Signed)
 Copied from CRM #[id number]. Topic: Clinical - Medication Question >> Mar 06, 2024  8:33 AM Burnard DEL wrote: Reason for CRM: Patient was seen on 03/04/2024 with Dr.Wendling, and prescribed a steroid he stated that he is feeling much better,however he has a lingering cough that he is having a hard time getting rid of.He would like to know if he could be prescribed anything for the cough.  [id number] - [place], [place] - [address] AT [facility] [address]  Phone: [phone] Fax: [phone]

## 2024-03-06 NOTE — Telephone Encounter (Signed)
 I sent both a cough med and also a separate inhaler to take with his Advair daily as needed if asthma flaring, but not bad enough to seek immediate care. Thx.

## 2024-03-06 NOTE — Telephone Encounter (Signed)
Called pt was advised and stated understand

## 2024-03-09 ENCOUNTER — Telehealth: Payer: Self-pay

## 2024-03-09 MED ORDER — QVAR REDIHALER 80 MCG/ACT IN AERB: INHALATION_SPRAY | RESPIRATORY_TRACT | 1 refills | Status: DC

## 2024-03-09 NOTE — Telephone Encounter (Signed)
 Called pt lvm and to call back if he had any concerns.

## 2024-03-09 NOTE — Telephone Encounter (Signed)
 Will defer to Dr. Frann, he saw him last week

## 2024-03-09 NOTE — Telephone Encounter (Signed)
 Copied from CRM #[id number]. Topic: Clinical - Medication Question >> Mar 09, 2024  3:26 PM Carlyon D wrote: Reason for CRM: Pt was seen last week by dr frann pt is much better after having a bad cough he does have a sore throat today he dosnt know if that is from coughing ? 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 He is asking if a different one can be sent to pharmacy. Pt would like a call back to further discuss. Call back # [phone].

## 2024-03-09 NOTE — Telephone Encounter (Signed)
 Alt sent, Epic noted it is preferred level 1. Could be reflux based on the medicine we sent and the improvement of the other symptoms. Pepcid/famotidine 20 mg 1-2 times daily can help with reflux symptoms.

## 2024-03-10 ENCOUNTER — Telehealth: Payer: Self-pay

## 2024-03-10 ENCOUNTER — Other Ambulatory Visit (HOSPITAL_COMMUNITY): Payer: Self-pay

## 2024-03-10 NOTE — Telephone Encounter (Signed)
 Pharmacy Patient Advocate Encounter   Received notification from Physician's Office that prior authorization for Qvar  RediHaler 80MCG/ACT aerosol is required/requested.   Insurance verification completed.   The patient is insured through [employer].   Per test claim: PA required; PA submitted to above mentioned insurance via Latent Key/confirmation #/EOC [id number] Status is pending

## 2024-03-10 NOTE — Telephone Encounter (Signed)
 PA needed for Ovar Redihaler 80 mg Nada

## 2024-03-16 ENCOUNTER — Other Ambulatory Visit (HOSPITAL_COMMUNITY): Payer: Self-pay

## 2024-03-16 NOTE — Telephone Encounter (Signed)
 Sent pt message about Prior Authorization for Qvar  RediHaler 80MCG/ACT aerosol  has been APPROVED from 02/09/2024 to 03/10/2025. Ran test claim, Copay is $28.70. This test claim was processed through [facility]

## 2024-03-16 NOTE — Telephone Encounter (Signed)
 Pharmacy Patient Advocate Encounter  Received notification from [employer] that Prior Authorization for Qvar  RediHaler 80MCG/ACT aerosol  has been APPROVED from 02/09/2024 to 03/10/2025. Ran test claim, Copay is $28.70. This test claim was processed through [facility]- copay amounts may vary at other pharmacies due to pharmacy/plan contracts, or as the patient moves through the different stages of their insurance plan.   PA #/Case ID/Reference #: [id number]

## 2024-03-20 ENCOUNTER — Telehealth: Payer: Self-pay | Admitting: *Deleted

## 2024-03-20 NOTE — Telephone Encounter (Signed)
 Pt scheduled for Monday with Dr. Frann

## 2024-03-20 NOTE — Telephone Encounter (Signed)
 Copied from CRM #[id number]. Topic: Clinical - Medication Question >> Mar 20, 2024  8:45 AM Revonda D wrote: Reason for CRM: Pt stated that he is currently experiencing a earache and a scratchy throat. Pt wants to know if he can get an antibiotic prescribed and also another refill for the cough suppressant. Pt would like a callback today if possible.

## 2024-03-20 NOTE — Telephone Encounter (Signed)
 Pt was call lvm schedule follow up.

## 2024-03-20 NOTE — Telephone Encounter (Signed)
 Pt saw you on 03/04/24.  Should we have him come back in or would you be able to send in something.

## 2024-03-20 NOTE — Telephone Encounter (Signed)
 Left message on machine to call back with information.

## 2024-03-23 ENCOUNTER — Encounter: Payer: Self-pay | Admitting: Family Medicine

## 2024-03-23 ENCOUNTER — Ambulatory Visit: Payer: Medicare (Managed Care) | Admitting: Family Medicine

## 2024-03-23 VITALS — BP 124/70 | HR 72 | Temp 98.0°F | Resp 16 | Ht 71.0 in | Wt 233.2 lb

## 2024-03-23 DIAGNOSIS — J208 Acute bronchitis due to other specified organisms: Secondary | ICD-10-CM | POA: Diagnosis not present

## 2024-03-23 DIAGNOSIS — B9689 Other specified bacterial agents as the cause of diseases classified elsewhere: Secondary | ICD-10-CM

## 2024-03-23 MED ORDER — AZITHROMYCIN 250 MG PO TABS: ORAL_TABLET | ORAL | 0 refills | Status: AC

## 2024-03-23 NOTE — Progress Notes (Signed)
 Chief Complaint  Patient presents with   Cough    Follow Up Cough    Kirk Sutton here for URI complaints.  Duration: 3 days  Associated symptoms: sinus congestion, sinus pain, rhinorrhea, ear pain, sore throat, shortness of breath, and productive cough Denies: itchy watery eyes, ear drainage, wheezing, myalgia, and fevers, N/V/D Treatment to date: added ICS to reg Advair, Claritin Sick contacts: Yes- various sick family members  Past Medical History:  Diagnosis Date   Allergic rhinitis    Asthma    Chronic prostatitis    sees urology   Diabetes mellitus    type 11 (a1c 6.0 03/2008)/no meds.   Hernia, umbilical    Hyperlipidemia    Hypertension    Psoriasis    @ hands, sees derm    Objective BP 124/70 (BP Location: Left Arm, Patient Position: Sitting)   Pulse 72   Temp 98 F (36.7 C) (Oral)   Resp 16   Ht 5' 11 (1.803 m)   Wt 233 lb 3.2 oz (105.8 kg)   SpO2 97%   BMI 32.52 kg/m  General: Awake, alert, appears stated age HEENT: AT, [place], ears patent b/l and TM's neg, nares patent w/ clear discharge, pharynx pink and without exudates, MMM, no sinus ttp Neck: No masses or asymmetry Heart: RRR Lungs: CTAB, no accessory muscle use Psych: Age appropriate judgment and insight, normal mood and affect  Acute bacterial bronchitis - Plan: azithromycin  (ZITHROMAX ) 250 MG tablet  Zpak. Cont Advair and Qvar  while sill. Continue to push fluids, practice good hand hygiene, cover mouth when coughing. F/u prn. If starting to experience fevers, shaking, or worsening shortness of breath, seek immediate care. Pt voiced understanding and agreement to the plan.  [name], DO 03/23/2024 1:59 PM

## 2024-03-23 NOTE — Patient Instructions (Addendum)
 Continue to push fluids, practice good hand hygiene, and cover your mouth if you cough.  If you start having fevers, shaking or shortness of breath, seek immediate care.  Asthma Action Plan There are 3 zones to consider: 1. Green Zone- This is the zone we hope to keep you in. Avoiding allergens and compliance with inhalers will keep you in this safe zone. No need to take anything extra while in this zone. 2. Yellow Zone- This zone is where we can save time, money and visits. You are not doing well enough to be considered in the green zone, but not bad enough to be in the red zone. This is where we need to remove any allergen or factor that is contributing to your breathing issues. You have a separate inhaler (inhaled steroid/Qvar ) to take twice daily in addition to your other inhalers. This should give you the push you need to get back to the green zone. Contact our office if you have any questions. 3. Red Zone- This is the zone where you should consider calling 911 vs going to the ER. Despite compliance with inhalers/meds (or maybe because we haven't been using them), our breathing isn't where we want it to be. Albuterol  isn't helping as much either and you need medical care. This is the zone we try to avoid as much as possible!  Let us  know if you need anything.

## 2024-03-25 ENCOUNTER — Ambulatory Visit: Payer: Self-pay

## 2024-03-25 NOTE — Telephone Encounter (Signed)
 FYI. Saw you on 03/23/24 given Zpak and Qvar , has taken zpak before w/o reaction. Triage scheduled him an appt w/ 03/26/24 with Dr. Watt. Could this be a reaction to the Qvar ?

## 2024-03-25 NOTE — Telephone Encounter (Signed)
 FYI Only or Action Required?: FYI only for provider: appointment scheduled on 03/26/24.  Patient was last seen in primary care on 03/23/2024 by Frann Mabel Mt, DO.  Called Nurse Triage reporting Rash.  Symptoms began several days ago.  Interventions attempted: OTC medications: Nivea Cream and Other: Stopped taking Qvar .  Symptoms are: stable.  Triage Disposition: See Physician Within 24 Hours  Patient/caregiver understands and will follow disposition?: Yes Reason for Disposition  Hives or itching  Answer Assessment - Initial Assessment Questions Nivea cream on it. Patient sees derm for eczema.   1. APPEARANCE of RASH: What does the rash look like? (e.g., spots, blisters, raised areas, skin peeling, scaly)     Red, blotchy, small red bumps  2. SIZE: How big are the spots? (e.g., tip of pen, eraser, coin; inches, centimeters)     Scattered, not big bumps  3. LOCATION: Where is the rash located?     Right and left shoulder, neck, hips  4. COLOR: What color is the rash? (Note: It is difficult to assess rash color in people with darker-colored skin. When this situation occurs, simply ask the caller to describe what they see.)     Red  5. ONSET: When did the rash begin?     Mild lower back itching over the weekend, early part of the week started on shoulder  6. FEVER: Do you have a fever? If Yes, ask: What is your temperature, how was it measured, and when did it start?     Denies  7. ITCHING: Does the rash itch? If Yes, ask: How bad is the itch? (Scale 1-10; or mild, moderate, severe)     6/10 when the itching starts  8. CAUSE: What do you think is causing the rash?     Possible reaction to Qvar   9. NEW MEDICINES: What new medicines are you taking? (e.g., name of antibiotic) When did you start taking this medication?.     Qvar  inhaler started Friday. Z-pak on Monday but reports taking a that before.   10. OTHER SYMPTOMS: Do you have any other  symptoms? (e.g., sore throat, fever, joint pain)       Denies  Protocols used: Rash - Widespread On Drugs-A-AH  Copied from CRM #[id number]. Topic: Clinical - Red Word Triage >> Mar 25, 2024  8:19 AM Aleatha BROCKS wrote: Red Word that prompted transfer to Nurse Triage: Patient stated taking beclomethasone (QVAR  REDIHALER) 80 MCG/ACT inhaler last Friday started itching then outbreak on shoulder on neck and back down to butt believes to be having a reaction to medication

## 2024-03-25 NOTE — Telephone Encounter (Signed)
 Highly unlikely related to Qvar - but we can stop until he's seen. He's tolerated Zpaks before. Agree with him seeing someone as it may not be related to either. Thx .

## 2024-03-25 NOTE — Telephone Encounter (Signed)
 LMOM for Pt with Dr. Gena recommendations.

## 2024-03-26 ENCOUNTER — Encounter: Payer: Self-pay | Admitting: Family Medicine

## 2024-03-26 ENCOUNTER — Ambulatory Visit (INDEPENDENT_AMBULATORY_CARE_PROVIDER_SITE_OTHER): Payer: Medicare (Managed Care) | Admitting: Family Medicine

## 2024-03-26 VITALS — BP 138/80 | HR 66 | Temp 98.1°F | Ht 71.0 in | Wt 233.0 lb

## 2024-03-26 DIAGNOSIS — R052 Subacute cough: Secondary | ICD-10-CM | POA: Diagnosis not present

## 2024-03-26 DIAGNOSIS — D649 Anemia, unspecified: Secondary | ICD-10-CM

## 2024-03-26 DIAGNOSIS — R21 Rash and other nonspecific skin eruption: Secondary | ICD-10-CM

## 2024-03-26 LAB — COMPREHENSIVE METABOLIC PANEL WITH GFR
ALT: 18 U/L (ref 3–53)
AST: 18 U/L (ref 5–37)
Albumin: 3.7 g/dL (ref 3.5–5.2)
Alkaline Phosphatase: 80 U/L (ref 39–117)
BUN: 24 mg/dL — ABNORMAL HIGH (ref 6–23)
CO2: 30 meq/L (ref 19–32)
Calcium: 8.7 mg/dL (ref 8.4–10.5)
Chloride: 99 meq/L (ref 96–112)
Creatinine, Ser: 1.09 mg/dL (ref 0.40–1.50)
GFR: 66.17 mL/min
Glucose, Bld: 105 mg/dL — ABNORMAL HIGH (ref 70–99)
Potassium: 4.1 meq/L (ref 3.5–5.1)
Sodium: 135 meq/L (ref 135–145)
Total Bilirubin: 0.9 mg/dL (ref 0.2–1.2)
Total Protein: 5.7 g/dL — ABNORMAL LOW (ref 6.0–8.3)

## 2024-03-26 LAB — CBC
HCT: 35.9 % — ABNORMAL LOW (ref 39.0–52.0)
Hemoglobin: 12.3 g/dL — ABNORMAL LOW (ref 13.0–17.0)
MCHC: 34.2 g/dL (ref 30.0–36.0)
MCV: 95.8 fl (ref 78.0–100.0)
Platelets: 142 K/uL — ABNORMAL LOW (ref 150.0–400.0)
RBC: 3.75 Mil/uL — ABNORMAL LOW (ref 4.22–5.81)
RDW: 14.1 % (ref 11.5–15.5)
WBC: 5.8 K/uL (ref 4.0–10.5)

## 2024-03-26 LAB — SEDIMENTATION RATE: Sed Rate: 17 mm/h (ref 0–20)

## 2024-03-26 MED ORDER — PREDNISONE 20 MG PO TABS: ORAL_TABLET | ORAL | 0 refills | Status: AC

## 2024-03-26 MED ORDER — BENZONATATE 200 MG PO CAPS: 200.0000 mg | ORAL_CAPSULE | Freq: Two times a day (BID) | ORAL | 0 refills | Status: AC | PRN

## 2024-03-26 NOTE — Patient Instructions (Addendum)
 I hope you are feeling better soon, please keep us  posted!  I will be in touch with your labs Allergy to Qvar  inhaler is not common, but always possible.  Stop using it!  Let's have you take prednisone  for 6 days for current rash. Ok to try your steroid creams that you use for psoriasis as well If you can get in with your dermatologist that is always helpful!   Meds ordered this encounter  Medications   benzonatate  (TESSALON ) 200 MG capsule    Sig: Take 1 capsule (200 mg total) by mouth 2 (two) times daily as needed for cough.    Dispense:  20 capsule    Refill:  0   predniSONE  (DELTASONE ) 20 MG tablet    Sig: Take 40 mg by mouth daily for 3 days, then 20 mg by mouth daily for 3 days    Dispense:  9 tablet    Refill:  0

## 2024-03-26 NOTE — Progress Notes (Signed)
 [facility] [address] [place], [place] [address] [phone]  Date:  03/26/2024   Name:  Kirk Sutton   DOB:  [birth year]   MRN:  [id number]  PCP:  Amon Aloysius BRAVO, MD    Chief Complaint: Rash (Onset 03/23/2024 /I started the Q-var on Friday)   History of Present Illness:  Kirk CAMILLE is a 74 y.o. very pleasant male patient who presents with the following:  Primary patient of my partner Dr. Amon seen today with concern of of a rash History of cough variant asthma, dyslipidemia, hypertension I have not seen him myself previously He was in recently and saw Dr. Frann, on January 5 with 3 days of upper respiratory type symptoms He was already taking Advair and Qvar .  Z-Pak added He also came in with an asthma exacerbation in the December, he was given a prednisone  burst  Discussed the use of AI scribe software for clinical note transcription with the patient, who gave verbal consent to proceed.  History of Present Illness Kirk Sutton is a 74 year old male with asthma who presents with a rash. Today is Thursday He developed a rash that began as a light rash on his back over the weekend and worsened by Monday. The rash is located on his neck, shoulder, and back, and is described as itchy and bothersome. The rash started before he began taking azithromycin  (Z-Pak) on Monday. He has used ice to alleviate the itching, which provided some relief.  He started Qvar  on Friday, and the rash began the day after starting this medication. He has since stopped using Qvar  as of yesterday. He has a history of using azithromycin  without previous allergic reactions. He is currently taking Mucinex DM, which he believes helps with the itching.  He has a history of asthma and uses Advair twice daily and albuterol  as needed-the Qvar  was added recently during asthma exacerbation. He also mentions taking Tessalon  for his cough.  No  swelling of the lips or tongue and no fever. No previous episodes of a similar rash and no new medications aside from Qvar  and azithromycin . He has a history of psoriasis and eczema, for which he sees a dermatologist annually and uses clobetasol cream.  No involvement of palms, soles, groin, or oral cavity   Patient Active Problem List   Diagnosis Date Noted   Cough variant asthma 06/19/2021   Chronic back pain 04/30/2018   DJD (degenerative joint disease) 05/22/2017   Snoring 04/03/2016   PCP NOTES >>>>> 02/24/2015   Anxiety state 07/15/2012   Annual physical exam 07/20/2010   Hyperglycemia 08/11/2008   PROSTATITIS, CHRONIC 03/31/2008   Dyslipidemia 10/13/2007   ALLERGIC RHINITIS 10/17/2006   Essential hypertension 04/09/2006   Extrinsic asthma 04/09/2006    Past Medical History:  Diagnosis Date   Allergic rhinitis    Asthma    Chronic prostatitis    sees urology   Diabetes mellitus    type 11 (a1c 6.0 03/2008)/no meds.   Hernia, umbilical    Hyperlipidemia    Hypertension    Psoriasis    @ hands, sees derm    Past Surgical History:  Procedure Laterality Date   APPENDECTOMY     FERTILITY SURGERY  03/19/1977   Spermatic Vein Ligation   HERNIA REPAIR     1950s   NASAL SEPTUM SURGERY  03/20/2007   SHOULDER SURGERY  03/19/2006   left  TESTICLE SURGERY     undescended repair    UMBILICAL HERNIA REPAIR  08/28/2021   VASECTOMY      Social History[1]  Family History  Problem Relation Age of Onset   Hypertension Mother    Colon polyps Mother    Heart attack Father        x 3 onset age 26, died at 108   Colon polyps Father    Cancer Father        bladder   Diabetes Sister    Hypertension Sister    Colon cancer Neg Hx    Prostate cancer Neg Hx    Stomach cancer Neg Hx    Esophageal cancer Neg Hx    Rectal cancer Neg Hx     Allergies[2]  Medication list has been reviewed and updated.  Medications Ordered Prior to Encounter[3]  Review of Systems:  As  per HPI- otherwise negative.   Physical Examination: Vitals:   03/26/24 0841  BP: 138/80  Pulse: 66  Temp: 98.1 F (36.7 C)  SpO2: 98%   Vitals:   03/26/24 0841  Weight: 233 lb (105.7 kg)  Height: 5' 11 (1.803 m)   Body mass index is 32.5 kg/m. Ideal Body Weight: Weight in (lb) to have BMI = 25: 178.9  GEN: no acute distress.  Central obesity, looks well  HEENT: Atraumatic, Normocephalic.  Bilateral TM wnl, oropharynx normal.  PEERL,EOMI.   Ears and Nose: No external deformity. CV: RRR, No M/G/R. No JVD. No thrill. No extra heart sounds. PULM: CTA B, no wheezes, crackles, rhonchi. No retractions. No resp. distress. No accessory muscle use. ABD: S, NT, ND, +BS. No rebound. No HSM. EXTR: No c/c/e PSYCH: Normally interactive. Conversant.  He has a discrete, mildly palpable erythematous rash mostly over upper arms and shoulders bilaterally       Assessment and Plan: Rash and nonspecific skin eruption - Plan: predniSONE  (DELTASONE ) 20 MG tablet, CBC, Comprehensive metabolic panel with GFR, Sedimentation rate  Subacute cough - Plan: benzonatate  (TESSALON ) 200 MG capsule  Assessment & Plan Acute generalized rash Rash developed before azithromycin , likely not the cause. Qvar , a new medication, is a possible allergen. Rash has improved since stopping Qvar . No other systemic allergic symptoms. - Ordered blood work for liver and kidney function. - Prescribed oral prednisone  for a few days. - Advised to stop Qvar . - Allow completion of azithromycin  course. - Consider dermatology referral if rash persists or worsens.  He is already established with a dermatologist who he can call  Asthma No adverse effects from Qvar  on respiratory function.  Psoriasis Current rash likely separate from psoriasis. Dermatologist follow-up scheduled for next year. - Continue current psoriasis management with dermatologist. - Consider earlier dermatologist appointment if rash  persists.  Signed Harlene Schroeder, MD  Received labs, message to patient. Results for orders placed or performed in visit on 03/26/24  CBC   Collection Time: 03/26/24  9:14 AM  Result Value Ref Range   WBC 5.8 4.0 - 10.5 K/uL   RBC 3.75 (L) 4.22 - 5.81 Mil/uL   Platelets 142.0 (L) 150.0 - 400.0 K/uL   Hemoglobin 12.3 (L) 13.0 - 17.0 g/dL   HCT 64.0 (L) 60.9 - 47.9 %   MCV 95.8 78.0 - 100.0 fl   MCHC 34.2 30.0 - 36.0 g/dL   RDW 85.8 88.4 - 84.4 %  Comprehensive metabolic panel with GFR   Collection Time: 03/26/24  9:14 AM  Result Value Ref Range   Sodium 135  135 - 145 mEq/L   Potassium 4.1 3.5 - 5.1 mEq/L   Chloride 99 96 - 112 mEq/L   CO2 30 19 - 32 mEq/L   Glucose, Bld 105 (H) 70 - 99 mg/dL   BUN 24 (H) 6 - 23 mg/dL   Creatinine, Ser 8.90 0.40 - 1.50 mg/dL   Total Bilirubin 0.9 0.2 - 1.2 mg/dL   Alkaline Phosphatase 80 39 - 117 U/L   AST 18 5 - 37 U/L   ALT 18 3 - 53 U/L   Total Protein 5.7 (L) 6.0 - 8.3 g/dL   Albumin 3.7 3.5 - 5.2 g/dL   GFR 33.82 >39.99 mL/min   Calcium  8.7 8.4 - 10.5 mg/dL  Sedimentation rate   Collection Time: 03/26/24  9:14 AM  Result Value Ref Range   Sed Rate 17 0 - 20 mm/hr       [1]  Social History Tobacco Use   Smoking status: Never   Smokeless tobacco: Never  Vaping Use   Vaping status: Never Used  Substance Use Topics   Alcohol use: Yes    Comment: socially, rare   Drug use: No  [2]  Allergies Allergen Reactions   Cetirizine Hcl     REACTION: prostatitis   Fexofenadine     REACTION: causes prostatitis   Qvar  [Beclomethasone]     Possible allergic reaction, pt developed a rash the day after starting this med.  He has tolerated other inhaled steroids   [3]  Current Outpatient Medications on File Prior to Visit  Medication Sig Dispense Refill   albuterol  (VENTOLIN  HFA) 108 (90 Base) MCG/ACT inhaler Inhale 2 puffs into the lungs every 6 (six) hours as needed for wheezing or shortness of breath. 18 g 5   ALPRAZolam (XANAX)  0.5 MG tablet Take 0.5 mg by mouth 2 (two) times daily as needed.     Ascorbic Acid (VITAMIN C) 1000 MG tablet Take 1,000 mg by mouth daily.     atorvastatin  (LIPITOR) 20 MG tablet Take 1 tablet (20 mg total) by mouth at bedtime. 90 tablet 1   azelastine (ASTELIN) 0.1 % nasal spray Place 1 spray into both nostrils every evening.     azithromycin  (ZITHROMAX ) 250 MG tablet Take 2 tabs the first day and then 1 tab daily until you run out. 6 tablet 0   b complex vitamins capsule Take 1 capsule by mouth daily.     celecoxib (CELEBREX) 100 MG capsule Take by mouth. Per orthopedics     clobetasol ointment (TEMOVATE) 0.05 % Apply 1 application topically 2 (two) times daily as needed. For hands     Coenzyme Q10 100 MG capsule Take 100 mg by mouth daily.     desloratadine (CLARINEX) 5 MG tablet Take 10 mg by mouth daily.     Dextroamphetamine Sulfate 20 MG TABS Take 20 mg by mouth in the morning and at bedtime.     FLUoxetine (PROZAC) 40 MG capsule Take 1 tablet daily  2   fluticasone -salmeterol (ADVAIR HFA) 115-21 MCG/ACT inhaler Inhale 2 puffs into the lungs in the morning and at bedtime.     hydrochlorothiazide  (HYDRODIURIL ) 25 MG tablet Take 1 tablet (25 mg total) by mouth daily. 90 tablet 1   Magnesium Gluconate 250 MG TABS Take 400 mg by mouth daily.     metoprolol  succinate (TOPROL -XL) 50 MG 24 hr tablet Take 1 tablet (50 mg total) by mouth daily. Take with or immediately following a meal 90 tablet 1  montelukast  (SINGULAIR ) 10 MG tablet TAKE 1 TABLET(10 MG) BY MOUTH AT BEDTIME 90 tablet 1   terazosin  (HYTRIN ) 1 MG capsule Take 3 capsules (3 mg total) by mouth daily. 270 capsule 1   traMADol (ULTRAM) 50 MG tablet Take 50 mg by mouth 3 (three) times daily as needed for severe pain (pain score 7-10).     Turmeric 500 MG CAPS Take 1,000 mg by mouth daily.     Vitamin D, Cholecalciferol, 25 MCG (1000 UT) CAPS Take 1 capsule by mouth daily.     No current facility-administered medications on file  prior to visit.   "

## 2024-04-07 LAB — OPHTHALMOLOGY REPORT-SCANNED

## 2024-05-12 ENCOUNTER — Ambulatory Visit: Payer: Medicare (Managed Care)

## 2024-05-18 ENCOUNTER — Encounter: Payer: Medicare (Managed Care) | Admitting: Internal Medicine
